# Patient Record
Sex: Male | Born: 1950
Health system: Southern US, Community
[De-identification: ages and names within clinical notes are randomized; demographics above are authoritative.]

## PROBLEM LIST (undated history)

## (undated) DIAGNOSIS — I10 Essential (primary) hypertension: Secondary | ICD-10-CM

## (undated) DIAGNOSIS — C801 Malignant (primary) neoplasm, unspecified: Secondary | ICD-10-CM

## (undated) DIAGNOSIS — H409 Unspecified glaucoma: Secondary | ICD-10-CM

## (undated) DIAGNOSIS — K219 Gastro-esophageal reflux disease without esophagitis: Secondary | ICD-10-CM

## (undated) DIAGNOSIS — N400 Enlarged prostate without lower urinary tract symptoms: Secondary | ICD-10-CM

## (undated) DIAGNOSIS — M1990 Unspecified osteoarthritis, unspecified site: Secondary | ICD-10-CM

## (undated) DIAGNOSIS — K635 Polyp of colon: Secondary | ICD-10-CM

## (undated) DIAGNOSIS — F419 Anxiety disorder, unspecified: Secondary | ICD-10-CM

## (undated) DIAGNOSIS — E785 Hyperlipidemia, unspecified: Secondary | ICD-10-CM

## (undated) HISTORY — DX: Essential (primary) hypertension: I10

## (undated) HISTORY — DX: Polyp of colon: K63.5

## (undated) HISTORY — DX: Benign prostatic hyperplasia without lower urinary tract symptoms: N40.0

## (undated) HISTORY — DX: Hyperlipidemia, unspecified: E78.5

## (undated) HISTORY — DX: Gastro-esophageal reflux disease without esophagitis: K21.9

---

## 2005-03-31 ENCOUNTER — Emergency Department: Payer: Self-pay | Admitting: Emergency Medicine

## 2007-12-16 ENCOUNTER — Ambulatory Visit: Payer: Self-pay | Admitting: Specialist

## 2007-12-16 ENCOUNTER — Emergency Department: Payer: Self-pay | Admitting: Emergency Medicine

## 2007-12-16 ENCOUNTER — Other Ambulatory Visit: Payer: Self-pay

## 2008-07-13 HISTORY — PX: COLONOSCOPY: SHX174

## 2009-03-13 LAB — HM COLONOSCOPY: HM COLON: ABNORMAL

## 2009-04-10 ENCOUNTER — Ambulatory Visit: Payer: Self-pay | Admitting: Gastroenterology

## 2009-08-08 ENCOUNTER — Ambulatory Visit: Payer: Self-pay | Admitting: Family Medicine

## 2010-07-17 ENCOUNTER — Other Ambulatory Visit: Payer: Self-pay | Admitting: Family Medicine

## 2012-07-26 DIAGNOSIS — Z8042 Family history of malignant neoplasm of prostate: Secondary | ICD-10-CM | POA: Insufficient documentation

## 2012-07-26 DIAGNOSIS — N401 Enlarged prostate with lower urinary tract symptoms: Secondary | ICD-10-CM | POA: Insufficient documentation

## 2012-07-26 DIAGNOSIS — R339 Retention of urine, unspecified: Secondary | ICD-10-CM | POA: Insufficient documentation

## 2012-07-26 DIAGNOSIS — R3129 Other microscopic hematuria: Secondary | ICD-10-CM | POA: Insufficient documentation

## 2012-09-20 DIAGNOSIS — R310 Gross hematuria: Secondary | ICD-10-CM | POA: Insufficient documentation

## 2012-11-30 ENCOUNTER — Ambulatory Visit: Payer: Self-pay | Admitting: Urology

## 2012-12-03 LAB — URINE CULTURE

## 2012-12-07 ENCOUNTER — Ambulatory Visit: Payer: Self-pay | Admitting: Urology

## 2014-05-31 ENCOUNTER — Encounter: Payer: Self-pay | Admitting: *Deleted

## 2014-06-11 ENCOUNTER — Encounter: Payer: Self-pay | Admitting: General Surgery

## 2014-06-11 ENCOUNTER — Ambulatory Visit (INDEPENDENT_AMBULATORY_CARE_PROVIDER_SITE_OTHER): Payer: BC Managed Care – PPO | Admitting: General Surgery

## 2014-06-11 VITALS — BP 102/76 | HR 80 | Resp 16 | Ht 69.0 in | Wt 168.0 lb

## 2014-06-11 DIAGNOSIS — L729 Follicular cyst of the skin and subcutaneous tissue, unspecified: Secondary | ICD-10-CM

## 2014-06-11 NOTE — Progress Notes (Signed)
Patient ID: Ernest Sweeney, male   DOB: [birth year], 63 y.o.   MRN: [id number]  Chief Complaint  Patient presents with  . Other    evaluation of cyst on back    HPI Ernest Sweeney is a 63 y.o. male who presents for an evaluation of a cyst on his back. The patient states the area has been present for approximately 7-8 years. The area has gradually gotten larger over time. He states the area will get inflamed and then go back down. He has had the area drained approximately 4-5 years ago.    HPI  Past Medical History  Diagnosis Date  . Hyperlipidemia   . Hypertension   . GERD (gastroesophageal reflux disease)   . Benign prostatic hypertrophy without lower urinary tract symptoms     History reviewed. No pertinent past surgical history.  History reviewed. No pertinent family history.  Social History History  Substance Use Topics  . Smoking status: Current Every Day Smoker -- 0.50 packs/day for 20 years  . Smokeless tobacco: Never Used  . Alcohol Use: Yes    No Known Allergies  Current Outpatient Prescriptions  Medication Sig Dispense Refill  . aspirin 81 MG tablet Take 81 mg by mouth daily.    . Cholecalciferol (VITAMIN D3) 5000 UNITS CAPS Take 1 capsule by mouth daily.    Marland Kitchen lisinopril (PRINIVIL,ZESTRIL) 10 MG tablet Take 10 mg by mouth daily.    Marland Kitchen lovastatin (MEVACOR) 20 MG tablet Take 20 mg by mouth at bedtime.    . tamsulosin (FLOMAX) 0.4 MG CAPS capsule Take 0.4 mg by mouth daily.     No current facility-administered medications for this visit.    Review of Systems Review of Systems  Constitutional: Negative.   Respiratory: Negative.   Cardiovascular: Negative.     Blood pressure 102/76, pulse 80, resp. rate 16, height 5\' 9"  (1.753 m), weight 168 lb (76.204 kg).  Physical Exam Physical Exam  Constitutional: He is oriented to person, place, and time. He appears well-developed and well-nourished.  Eyes: Conjunctivae are normal. No scleral icterus.  Neck: Neck  supple.  Cardiovascular: Normal rate, regular rhythm and normal heart sounds.   Pulmonary/Chest: Effort normal and breath sounds normal.  Musculoskeletal:       Back:       Arms: Neurological: He is alert and oriented to person, place, and time.  Skin: Skin is warm and dry.    Data Reviewed none  Assessment 2cm cutaneous cyst mid upper back, symptomatic     Plan    Patient to return to have excision of back cyst. Patient is agreeable to this       [facility] G 06/11/2014, 2:58 PM

## 2014-06-11 NOTE — Patient Instructions (Signed)
Return for planned excision of the cyst on backl

## 2014-06-19 ENCOUNTER — Encounter: Payer: Self-pay | Admitting: General Surgery

## 2014-06-19 ENCOUNTER — Other Ambulatory Visit: Payer: Self-pay | Admitting: General Surgery

## 2014-06-19 ENCOUNTER — Ambulatory Visit (INDEPENDENT_AMBULATORY_CARE_PROVIDER_SITE_OTHER): Payer: BC Managed Care – PPO | Admitting: General Surgery

## 2014-06-19 VITALS — BP 116/76 | HR 76 | Resp 12 | Ht 69.0 in | Wt 167.0 lb

## 2014-06-19 DIAGNOSIS — L729 Follicular cyst of the skin and subcutaneous tissue, unspecified: Secondary | ICD-10-CM

## 2014-06-19 HISTORY — PX: OTHER SURGICAL HISTORY: SHX169

## 2014-06-19 NOTE — Patient Instructions (Addendum)
Patient to return in 2 weeks for suture removal. The patient is aware to call back for any questions or concerns. May use Tylenol or ibuprofen for pain.

## 2014-06-19 NOTE — Progress Notes (Signed)
Patient ID: Ernest Sweeney, male   DOB: 02-Mar-1951, 62 y.o.   MRN: [id number]  The patient presents for an excision of a skin cyst on the back. No new problems at this time.   Procedure: Excision cyst on upper back.  Anesthetic: 41ml 1% xylocaine mixed with 0.5% marcaine  Prep: Chloro Prep  After local anesthesia and prep vertical elliptical incision made. Cyst was 2.5cm in diameter and was excised out in full. 3 bleeders were noted that required suture ligatures of 3-0 vicryl. Other bleeding points were cuaterized. Deeper tissue cloed with 3-0 Vicryl. Skin closed with interrupted 4-0 Nylon sutures. Dressed with neosporin, telfa, gauze and tegaderm. No immediate problems from procedure.

## 2014-06-21 ENCOUNTER — Encounter: Payer: Self-pay | Admitting: General Surgery

## 2014-06-21 LAB — PATHOLOGY

## 2014-06-25 ENCOUNTER — Telehealth: Payer: Self-pay | Admitting: *Deleted

## 2014-06-25 NOTE — Telephone Encounter (Signed)
-----   Message from Christene Lye, MD sent at 06/22/2014  8:18 AM EST ----- Please let pt pt know the pathology was normal.

## 2014-06-25 NOTE — Telephone Encounter (Signed)
Notified patient as instructed, patient pleased. Discussed follow-up appointments, patient agrees  

## 2014-06-26 ENCOUNTER — Telehealth: Payer: Self-pay | Admitting: *Deleted

## 2014-06-26 NOTE — Telephone Encounter (Signed)
-----   Message from Christene Lye, MD sent at 06/22/2014  8:18 AM EST ----- Please let pt pt know the pathology was normal.

## 2014-07-03 ENCOUNTER — Ambulatory Visit (INDEPENDENT_AMBULATORY_CARE_PROVIDER_SITE_OTHER): Payer: Self-pay | Admitting: *Deleted

## 2014-07-03 DIAGNOSIS — L729 Follicular cyst of the skin and subcutaneous tissue, unspecified: Secondary | ICD-10-CM

## 2014-07-03 NOTE — Patient Instructions (Signed)
The patient is aware to call back for any questions or concerns.  

## 2014-07-03 NOTE — Progress Notes (Signed)
Patient ID: Ernest Sweeney, male   DOB: 1951/05/18, 63 y.o.   MRN: [id number]   Patient came in today for a wound check.  The wound is clean, with no signs of infection noted. Follow up as scheduled. The sutures were removed and steri strips applied. Patient notified of pathology results.

## 2014-08-24 LAB — PSA: PSA: 2.6

## 2014-08-28 ENCOUNTER — Encounter: Payer: Self-pay | Admitting: General Surgery

## 2014-08-28 ENCOUNTER — Other Ambulatory Visit: Payer: Self-pay

## 2014-08-28 ENCOUNTER — Ambulatory Visit (INDEPENDENT_AMBULATORY_CARE_PROVIDER_SITE_OTHER): Payer: BLUE CROSS/BLUE SHIELD | Admitting: General Surgery

## 2014-08-28 VITALS — BP 98/64 | HR 90 | Resp 14 | Ht 69.0 in | Wt 174.0 lb

## 2014-08-28 DIAGNOSIS — Z86010 Personal history of colonic polyps: Secondary | ICD-10-CM

## 2014-08-28 MED ORDER — POLYETHYLENE GLYCOL 3350 17 GM/SCOOP PO POWD: 1.0000 | Freq: Once | ORAL | Status: DC

## 2014-08-28 NOTE — Patient Instructions (Signed)
Colonoscopy  A colonoscopy is an exam to look at the entire large intestine (colon). This exam can help find problems such as tumors, polyps, inflammation, and areas of bleeding. The exam takes about 1 hour.   LET YOUR HEALTH CARE PROVIDER KNOW ABOUT:   · Any allergies you have.  · All medicines you are taking, including vitamins, herbs, eye drops, creams, and over-the-counter medicines.  · Previous problems you or members of your family have had with the use of anesthetics.  · Any blood disorders you have.  · Previous surgeries you have had.  · Medical conditions you have.  RISKS AND COMPLICATIONS   Generally, this is a safe procedure. However, as with any procedure, complications can occur. Possible complications include:  · Bleeding.  · Tearing or rupture of the colon wall.  · Reaction to medicines given during the exam.  · Infection (rare).  BEFORE THE PROCEDURE   · Ask your health care provider about changing or stopping your regular medicines.  · You may be prescribed an oral bowel prep. This involves drinking a large amount of medicated liquid, starting the day before your procedure. The liquid will cause you to have multiple loose stools until your stool is almost clear or light green. This cleans out your colon in preparation for the procedure.  · Do not eat or drink anything else once you have started the bowel prep, unless your health care provider tells you it is safe to do so.  · Arrange for someone to drive you home after the procedure.  PROCEDURE   · You will be given medicine to help you relax (sedative).  · You will lie on your side with your knees bent.  · A long, flexible tube with a light and camera on the end (colonoscope) will be inserted through the rectum and into the colon. The camera sends video back to a computer screen as it moves through the colon. The colonoscope also releases carbon dioxide gas to inflate the colon. This helps your health care provider see the area better.  · During  the exam, your health care provider may take a small tissue sample (biopsy) to be examined under a microscope if any abnormalities are found.  · The exam is finished when the entire colon has been viewed.  AFTER THE PROCEDURE   · Do not drive for 24 hours after the exam.  · You may have a small amount of blood in your stool.  · You may pass moderate amounts of gas and have mild abdominal cramping or bloating. This is caused by the gas used to inflate your colon during the exam.  · Ask when your test results will be ready and how you will get your results. Make sure you get your test results.  Document Released: 06/26/2000 Document Revised: 04/19/2013 Document Reviewed: 03/06/2013  Exit[employer]® Patient Information ©2015 [employer]. This information is not intended to replace advice given to you by your health care provider. Make sure you discuss any questions you have with your health care provider.

## 2014-08-28 NOTE — Progress Notes (Signed)
Patient ID: Ernest Sweeney, male   DOB: Apr 11, 1951, 64 y.o.   MRN: [id number]  Chief Complaint  Patient presents with  . Colonoscopy    HPI Ernest Sweeney is a 64 y.o. male.  Here today to discuss having a colonoscopy. Last completed 2010 with a history of colon polyps. Denies any gastrointestinal issues. Bowels move about everyother day , no bleeding noted. He has recently been having headaches and is trying Pamelor to see if that will help. No family history of colon cancer.   HPI  Past Medical History  Diagnosis Date  . Hyperlipidemia   . Hypertension   . GERD (gastroesophageal reflux disease)   . Benign prostatic hypertrophy without lower urinary tract symptoms   . Colon polyp     Past Surgical History  Procedure Laterality Date  . Colonoscopy  2010    Dr Sula Rumple  . Excision skin cyst  06-19-14    Dr. Festus Aloe [facility]    No family history on file.  Social History History  Substance Use Topics  . Smoking status: Current Every Day Smoker -- 0.50 packs/day for 20 years  . Smokeless tobacco: Never Used  . Alcohol Use: 0.0 oz/week    0 Standard drinks or equivalent per week    No Known Allergies  Current Outpatient Prescriptions  Medication Sig Dispense Refill  . aspirin 81 MG tablet Take 81 mg by mouth daily.    . Cholecalciferol (VITAMIN D3) 5000 UNITS CAPS Take 1 capsule by mouth daily.    Marland Kitchen lisinopril (PRINIVIL,ZESTRIL) 10 MG tablet Take 10 mg by mouth daily.    Marland Kitchen lovastatin (MEVACOR) 20 MG tablet Take 20 mg by mouth at bedtime.    . tamsulosin (FLOMAX) 0.4 MG CAPS capsule Take 0.4 mg by mouth daily.     No current facility-administered medications for this visit.    Review of Systems Review of Systems  Constitutional: Negative.   Respiratory: Negative.   Cardiovascular: Negative.   Gastrointestinal: Negative for diarrhea, constipation and blood in stool.  Neurological: Positive for headaches.    Height 5\' 9"  (1.753 m).  Physical Exam Physical  Exam  Constitutional: He is oriented to person, place, and time. He appears well-developed and well-nourished.  Eyes: Conjunctivae are normal. No scleral icterus.  Neck: Neck supple.  Cardiovascular: Normal rate, regular rhythm and normal heart sounds.   Pulmonary/Chest: Effort normal and breath sounds normal.  Abdominal: Soft. Normal appearance and bowel sounds are normal. There is no tenderness. No hernia.  Lymphadenopathy:    He has no cervical adenopathy.  Neurological: He is alert and oriented to person, place, and time.  Skin: Skin is warm and dry.    Data Reviewed none  Assessment    Stable physical exam. PH of colon polyps. Last colonoscopy was in 2010    Plan    Colonoscopy with possible biopsy/polypectomy prn: Information regarding the procedure, including its potential risks and complications (including but not limited to perforation of the bowel, which may require emergency surgery to repair, and bleeding) was verbally given to the patient. Educational information regarding lower instestinal endoscopy was given to the patient. Written instructions for how to complete the bowel prep using Miralax were provided. The importance of drinking ample fluids to avoid dehydration as a result of the prep emphasized.       Ernest Sweeney M 08/28/2014, 3:51 PM

## 2014-08-28 NOTE — Progress Notes (Signed)
Patient is scheduled for a colonoscopy at [facility] on 10/10/14. He is aware to pre register with the hospital at least 2 days prior. He will only take his Lisinopril at 6 am with a small sip of water the day of. Miralax prescription has been sent into his pharmacy. Patient is aware of date and instructions.

## 2014-09-19 ENCOUNTER — Telehealth: Payer: Self-pay | Admitting: *Deleted

## 2014-09-19 NOTE — Telephone Encounter (Signed)
Patient called the office yesterday to reschedule colonoscopy with Dr. Jamal Collin that is currently scheduled for 10-10-14 at [facility].  I tried to contact patient on home and cell numbers but there was no answer and I was not able to leave a message.  Will await to hear back from patient before we cancel/reschedule colonoscopy.

## 2014-09-20 ENCOUNTER — Telehealth: Payer: Self-pay | Admitting: *Deleted

## 2014-09-20 NOTE — Telephone Encounter (Signed)
Message left on home number for patient to call the office.  

## 2014-09-20 NOTE — Telephone Encounter (Signed)
Patient called the office back to reschedule colonoscopy to a different date but patient decided to leave as scheduled for now.   We will proceed with colonoscopy on 10-10-14 at [facility].

## 2014-10-03 ENCOUNTER — Other Ambulatory Visit: Payer: Self-pay | Admitting: General Surgery

## 2014-10-03 DIAGNOSIS — Z86010 Personal history of colonic polyps: Secondary | ICD-10-CM

## 2014-10-10 ENCOUNTER — Ambulatory Visit
Admit: 2014-10-10 | Disposition: A | Discharge status: Home / Self Care | Payer: Self-pay | Attending: General Surgery | Admitting: General Surgery

## 2014-10-10 DIAGNOSIS — D124 Benign neoplasm of descending colon: Secondary | ICD-10-CM | POA: Diagnosis not present

## 2014-10-10 DIAGNOSIS — Z86010 Personal history of colonic polyps: Secondary | ICD-10-CM | POA: Diagnosis not present

## 2014-10-10 DIAGNOSIS — D127 Benign neoplasm of rectosigmoid junction: Secondary | ICD-10-CM | POA: Diagnosis not present

## 2014-10-12 ENCOUNTER — Encounter: Payer: Self-pay | Admitting: General Surgery

## 2014-10-15 ENCOUNTER — Encounter: Payer: Self-pay | Admitting: General Surgery

## 2014-10-16 ENCOUNTER — Telehealth: Payer: Self-pay | Admitting: *Deleted

## 2014-10-16 NOTE — Telephone Encounter (Signed)
-----   Message from Christene Lye, MD sent at 10/15/2014  4:58 PM EDT ----- Please let pt pt know the pathology was normal.

## 2014-10-17 ENCOUNTER — Telehealth: Payer: Self-pay | Admitting: *Deleted

## 2014-10-17 NOTE — Telephone Encounter (Signed)
Pt aware that path was normal

## 2014-11-02 NOTE — Op Note (Signed)
PATIENT NAME:  Ernest Sweeney, Ernest Sweeney MR#:  [id number] DATE OF BIRTH:  11/23/1950  DATE OF PROCEDURE:  12/07/2012  PREOPERATIVE DIAGNOSES: 1. Hematuria.  2. Abnormal CT urogram.   POSTOPERATIVE DIAGNOSES: 1. Hematuria. 2. Abnormality CT urogram.  PROCEDURES: 1. Cystoscopy.  2. Left retrograde pyelogram.  3. Left ureteroscopy.   SURGEON: John Giovanni, M.D.   ASSISTANT: None.   ANESTHESIA: General.   INDICATIONS: This 64 year old male with a history of a single episode of gross hematuria. A CT urogram did show focal narrowing of the left distal ureter with mild dilation of the ureter proximal to this narrowing. He presents for cystoscopy under anesthesia with left retrograde pyelogram and possible left ureteroscopy. His hematuria has resolved.   DESCRIPTION OF PROCEDURE: The patient was taken to the cystoscopy suite and placed supine on the table. General anesthetic was administered via an LMA. He was then placed in the low lithotomy position and his external genitalia were prepped and draped in the usual fashion. Timeout was performed per protocol with all in agreement. A 21 French cystoscope with 30-degree lens was lubricated and passed under direct vision. The urethra was normal in caliber without stricture. The prostate was hypervascular and friable with mild lateral lobe enlargement. There was a moderate-size median lobe present. Bladder mucosa was closely inspected with 30 and 70-degree lenses. Ureteral orifices were normal-appearing with clear efflux bilaterally. There were wide mouth diverticula in the right and left posterior walls which were scoped and were freed of the lesions. Bladder mucosa was normal in appearance without erythema, solid or papillary lesions. An 8 [place] cone-tipped catheter was placed through the cystoscope and seated at the left ureteral orifice. Retrograde pyelogram was performed which showed no filling defect. There was narrowing of the distal ureter with mild  proximal dilation; however, the remainder of the ureter was normal in appearance and the left collecting system was normal in appearance. The cone-tipped catheter was removed. There was some hang of contrast in the distal ureter that did not empty completely. At this point, it was elected to proceed with ureteroscopy and a 0.035 guidewire was placed through the cystoscope and passed up the left.   PAST SURGICAL HISTORY: Up the left ureter and fluoroscopic guidance without problems. The cystoscope was removed and a 6-French semirigid ureteroscope was passed per urethra. A second guidewire was placed through the ureteroscope and into the left ureteral orifice and the ureteroscope was advanced over the guidewire. There was slight narrowing of the distal ureter, however, no definite stricture was identified. The scope was easily negotiated through this narrowed area. The scope was advanced up to the proximal ureter. The scope was then slowly withdrawn. No ureteral abnormalities were identified.  In the region of the distal ureter, there was no stone, evidence of stricture or tumor. The 6-French scope easily passed through this narrowed area. The ureteroscope was removed. It was elected not to place a ureteral stent. Cystoscope sheath was repassed and the bladder was emptied and the effluent was clear. A B and O suppository was placed per rectum. He was taken to PACU in stable condition. There were no complications.   ESTIMATED BLOOD LOSS:  Minimal.     ____________________________ Ronda Fairly. Bernardo Heater, MD scs:rw D: 12/07/2012 12:00:14 ET T: 12/07/2012 12:37:30 ET JOB#: [id number]  cc: Nicki Reaper C. Bernardo Heater, MD, <Dictator> Abbie Sons MD ELECTRONICALLY SIGNED 12/12/2012 12:23

## 2014-11-05 LAB — SURGICAL PATHOLOGY

## 2014-11-14 LAB — LIPID PANEL
CHOLESTEROL: 171 mg/dL (ref 0–200)
HDL: 65 mg/dL (ref 35–70)
LDL CALC: 88 mg/dL
Triglycerides: 87 mg/dL (ref 40–160)

## 2015-02-03 ENCOUNTER — Other Ambulatory Visit: Payer: Self-pay | Admitting: Family Medicine

## 2015-05-31 ENCOUNTER — Encounter: Payer: Self-pay | Admitting: Family Medicine

## 2015-05-31 ENCOUNTER — Ambulatory Visit: Payer: Self-pay | Admitting: Family Medicine

## 2015-05-31 DIAGNOSIS — E559 Vitamin D deficiency, unspecified: Secondary | ICD-10-CM | POA: Insufficient documentation

## 2015-05-31 DIAGNOSIS — G8929 Other chronic pain: Secondary | ICD-10-CM | POA: Insufficient documentation

## 2015-05-31 DIAGNOSIS — K219 Gastro-esophageal reflux disease without esophagitis: Secondary | ICD-10-CM | POA: Insufficient documentation

## 2015-05-31 DIAGNOSIS — M25512 Pain in left shoulder: Secondary | ICD-10-CM

## 2015-05-31 DIAGNOSIS — R519 Headache, unspecified: Secondary | ICD-10-CM | POA: Insufficient documentation

## 2015-05-31 DIAGNOSIS — E785 Hyperlipidemia, unspecified: Secondary | ICD-10-CM | POA: Insufficient documentation

## 2015-05-31 DIAGNOSIS — J302 Other seasonal allergic rhinitis: Secondary | ICD-10-CM | POA: Insufficient documentation

## 2015-05-31 DIAGNOSIS — M25511 Pain in right shoulder: Secondary | ICD-10-CM

## 2015-05-31 DIAGNOSIS — R51 Headache: Secondary | ICD-10-CM

## 2015-06-27 ENCOUNTER — Other Ambulatory Visit: Payer: Self-pay | Admitting: Family Medicine

## 2015-06-28 ENCOUNTER — Other Ambulatory Visit: Payer: Self-pay | Admitting: Family Medicine

## 2015-06-28 ENCOUNTER — Telehealth: Payer: Self-pay | Admitting: Family Medicine

## 2015-06-28 MED ORDER — TAMSULOSIN HCL 0.4 MG PO CAPS: 0.4000 mg | ORAL_CAPSULE | Freq: Every day | ORAL | Status: DC

## 2015-06-28 NOTE — Telephone Encounter (Signed)
Pt needs refill on Flomax to be sent to [place]. Pt is waiting on his to come into mail order and that will take 7 more days and pt is out.

## 2015-08-03 ENCOUNTER — Other Ambulatory Visit: Payer: Self-pay | Admitting: Family Medicine

## 2015-08-04 NOTE — Telephone Encounter (Signed)
He needs a follow up

## 2015-08-05 NOTE — Telephone Encounter (Signed)
Left voice message to inform patient to schedule appointment

## 2015-09-12 ENCOUNTER — Ambulatory Visit: Payer: Self-pay | Admitting: Family Medicine

## 2015-09-25 ENCOUNTER — Telehealth: Payer: Self-pay | Admitting: Family Medicine

## 2015-09-25 NOTE — Telephone Encounter (Signed)
Pt needs refill on Lisinopril to be sent to [address]. Pt has an appt on 10/03/2015.

## 2015-09-26 MED ORDER — LISINOPRIL 10 MG PO TABS
10.0000 mg | ORAL_TABLET | Freq: Every day | ORAL | Status: DC
Start: 2015-09-26 — End: 2015-10-03

## 2015-09-26 NOTE — Telephone Encounter (Signed)
Sent 30 days, needs to be seen

## 2015-10-03 ENCOUNTER — Ambulatory Visit (INDEPENDENT_AMBULATORY_CARE_PROVIDER_SITE_OTHER): Payer: BLUE CROSS/BLUE SHIELD | Admitting: Family Medicine

## 2015-10-03 ENCOUNTER — Encounter: Payer: Self-pay | Admitting: Family Medicine

## 2015-10-03 VITALS — BP 116/68 | HR 111 | Temp 98.0°F | Resp 16 | Ht 69.0 in | Wt 172.2 lb

## 2015-10-03 DIAGNOSIS — N401 Enlarged prostate with lower urinary tract symptoms: Secondary | ICD-10-CM | POA: Diagnosis not present

## 2015-10-03 DIAGNOSIS — J302 Other seasonal allergic rhinitis: Secondary | ICD-10-CM

## 2015-10-03 DIAGNOSIS — Z125 Encounter for screening for malignant neoplasm of prostate: Secondary | ICD-10-CM

## 2015-10-03 DIAGNOSIS — R000 Tachycardia, unspecified: Secondary | ICD-10-CM

## 2015-10-03 DIAGNOSIS — Z79899 Other long term (current) drug therapy: Secondary | ICD-10-CM

## 2015-10-03 DIAGNOSIS — E785 Hyperlipidemia, unspecified: Secondary | ICD-10-CM

## 2015-10-03 DIAGNOSIS — I10 Essential (primary) hypertension: Secondary | ICD-10-CM | POA: Diagnosis not present

## 2015-10-03 DIAGNOSIS — K219 Gastro-esophageal reflux disease without esophagitis: Secondary | ICD-10-CM

## 2015-10-03 DIAGNOSIS — R51 Headache: Secondary | ICD-10-CM | POA: Diagnosis not present

## 2015-10-03 DIAGNOSIS — R519 Headache, unspecified: Secondary | ICD-10-CM

## 2015-10-03 DIAGNOSIS — E559 Vitamin D deficiency, unspecified: Secondary | ICD-10-CM

## 2015-10-03 MED ORDER — LOVASTATIN 20 MG PO TABS: 20.0000 mg | ORAL_TABLET | Freq: Every day | ORAL | Status: DC

## 2015-10-03 MED ORDER — VITAMIN D 50 MCG (2000 UT) PO CAPS
1.0000 | ORAL_CAPSULE | Freq: Every day | ORAL | Status: AC
Start: 2015-10-03 — End: ?

## 2015-10-03 MED ORDER — FLUTICASONE PROPIONATE 50 MCG/ACT NA SUSP: 2.0000 | Freq: Every day | NASAL | Status: DC

## 2015-10-03 NOTE — Progress Notes (Signed)
Name: Ernest Sweeney   MRN: [id number]    DOB: [birth year]   Date:10/03/2015       Progress Note  Subjective  Chief Complaint  Chief Complaint  Patient presents with  . Medication Refill  . Hypertension  . Gastroesophageal Reflux    well controlled    HPI  HTN: he states he gets dizzy at times, not checking bp at home, but last visit bp was also low, we will stop medication and monitor. Denies chest pain or palpitation - but VS showed tachycardia  GERD: no longer has heartburn or regurgitation, not taking medication  Hyperlipidemia: taking Lovastatin, no side effect:   BPH: used to see Urologist, hematuria resolved, on Flomax, denies dribbling , nocturia at most once per night.   Headaches: no longer taking Elavil, headaches resolved  Seasonal Allergic Rhinitis: currently not having symptoms, no sneezing or nasal congestion, but has symptoms during the Spring and would like something to control it when it occurs.    Patient Active Problem List   Diagnosis Date Noted  . Vitamin D deficiency 05/31/2015  . Hyperlipidemia LDL goal <100 05/31/2015  . Seasonal allergic rhinitis 05/31/2015  . GERD without esophagitis 05/31/2015  . Chronic pain of both shoulders 05/31/2015  . Headache disorder 05/31/2015  . Family history of malignant neoplasm of prostate 07/26/2012  . Hyperplasia of prostate with lower urinary tract symptoms (LUTS) 07/26/2012  . Incomplete bladder emptying 07/26/2012  . Hematuria, microscopic 07/26/2012    Past Surgical History  Procedure Laterality Date  . Colonoscopy  2010    Dr Sula Rumple  . Excision skin cyst  06-19-14    Dr. Festus Aloe [facility]    Family History  Problem Relation Age of Onset  . Heart disease Mother   . Diabetes Father   . Cancer Sister     Breast  . Cancer Brother     Prostate    Social History   Social History  . Marital Status: Married    Spouse Name: N/A  . Number of Children: N/A  . Years of Education: N/A    Occupational History  . Not on file.   Social History Main Topics  . Smoking status: Current Every Day Smoker -- 0.50 packs/day for 20 years  . Smokeless tobacco: Never Used  . Alcohol Use: 0.0 oz/week    0 Standard drinks or equivalent per week  . Drug Use: No  . Sexual Activity: Not on file   Other Topics Concern  . Not on file   Social History Narrative     Current outpatient prescriptions:  .  aspirin 81 MG tablet, Take 81 mg by mouth daily., Disp: , Rfl:  .  Cholecalciferol (VITAMIN D) 2000 units CAPS, Take 1 capsule (2,000 Units total) by mouth daily., Disp: 30 capsule, Rfl: 0 .  fluticasone (FLONASE) 50 MCG/ACT nasal spray, Place 2 sprays into both nostrils daily., Disp: 16 g, Rfl: 6 .  lovastatin (MEVACOR) 20 MG tablet, Take 1 tablet (20 mg total) by mouth at bedtime., Disp: 90 tablet, Rfl: 3 .  tamsulosin (FLOMAX) 0.4 MG CAPS capsule, Take 1 capsule (0.4 mg total) by mouth daily., Disp: 90 capsule, Rfl: 2  No Known Allergies   ROS  Ten systems reviewed and is negative except as mentioned in HPI   Objective  Filed Vitals:   10/03/15 1503  BP: 116/68  Pulse: 111  Temp: 98 F (36.7 C)  TempSrc: Oral  Resp: 16  Height: 5\' 9"  (1.753 m)  Weight: 172 lb 3.2 oz (78.109 kg)  SpO2: 98%    Body mass index is 25.42 kg/(m^2).  Physical Exam  Constitutional: Patient appears well-developed and well-nourished. No distress.  HEENT: head atraumatic, normocephalic, pupils equal and reactive to light,  neck supple, throat within normal limits Cardiovascular: Normal rate, regular rhythm and normal heart sounds.  No murmur heard. No BLE edema. Pulmonary/Chest: Effort normal and breath sounds normal. No respiratory distress. Abdominal: Soft.  There is no tenderness. Psychiatric: Patient has a normal mood and affect. behavior is normal. Judgment and thought content normal.   PHQ2/9: Depression screen PHQ 2/9 10/03/2015  Decreased Interest 1  Down, Depressed, Hopeless  1  PHQ - 2 Score 2  Altered sleeping 1  Tired, decreased energy 1  Change in appetite 1  Feeling bad or failure about yourself  1  Trouble concentrating 1  Moving slowly or fidgety/restless 0  Suicidal thoughts 0  PHQ-9 Score 7  Difficult doing work/chores Not difficult at all   Upset because cousin died this week - got hit by a car, also worries about finances  Fall Risk: Fall Risk  10/03/2015  Falls in the past year? No    Functional Status Survey: Is the patient deaf or have difficulty hearing?: No Does the patient have difficulty seeing, even when wearing glasses/contacts?: No Does the patient have difficulty concentrating, remembering, or making decisions?: No Does the patient have difficulty walking or climbing stairs?: No Does the patient have difficulty dressing or bathing?: No Does the patient have difficulty doing errands alone such as visiting a doctor's office or shopping?: No    Assessment & Plan  1. Hyperlipidemia LDL goal <100  - lovastatin (MEVACOR) 20 MG tablet; Take 1 tablet (20 mg total) by mouth at bedtime.  Dispense: 90 tablet; Refill: 3 - Lipid panel  2. Headache disorder  Resolved  3. Hyperplasia of prostate with lower urinary tract symptoms (LUTS)  - PSA  4. Vitamin D deficiency  - Cholecalciferol (VITAMIN D) 2000 units CAPS; Take 1 capsule (2,000 Units total) by mouth daily.  Dispense: 30 capsule; Refill: 0 - VITAMIN D 25 Hydroxy (Vit-D Deficiency, Fractures)  5. Essential hypertension  - Comprehensive metabolic panel - CBC with Differential/Platelet  6. Long-term use of high-risk medication  - Comprehensive metabolic panel - CBC with Differential/Platelet  7. Prostate cancer screening  - PSA  8. Tachycardia  Hopefully will resolve once bp is controlled  9. Seasonal allergic rhinitis  - fluticasone (FLONASE) 50 MCG/ACT nasal spray; Place 2 sprays into both nostrils daily.  Dispense: 16 g; Refill: 6

## 2015-10-19 LAB — COMPREHENSIVE METABOLIC PANEL
ALK PHOS: 52 IU/L (ref 39–117)
ALT: 11 IU/L (ref 0–44)
AST: 16 IU/L (ref 0–40)
Albumin/Globulin Ratio: 1.7 (ref 1.2–2.2)
Albumin: 4.3 g/dL (ref 3.6–4.8)
BUN/Creatinine Ratio: 12 (ref 10–24)
BUN: 15 mg/dL (ref 8–27)
Bilirubin Total: 0.8 mg/dL (ref 0.0–1.2)
CO2: 25 mmol/L (ref 18–29)
CREATININE: 1.24 mg/dL (ref 0.76–1.27)
Calcium: 9.6 mg/dL (ref 8.6–10.2)
Chloride: 102 mmol/L (ref 96–106)
GFR calc Af Amer: 71 mL/min/{1.73_m2} (ref >59)
GFR calc non Af Amer: 61 mL/min/{1.73_m2} (ref >59)
GLOBULIN, TOTAL: 2.6 g/dL (ref 1.5–4.5)
GLUCOSE: 88 mg/dL (ref 65–99)
Potassium: 4.8 mmol/L (ref 3.5–5.2)
SODIUM: 140 mmol/L (ref 134–144)
Total Protein: 6.9 g/dL (ref 6.0–8.5)

## 2015-10-19 LAB — CBC WITH DIFFERENTIAL/PLATELET
BASOS ABS: 0 10*3/uL (ref 0.0–0.2)
Basos: 1 %
EOS (ABSOLUTE): 0.1 10*3/uL (ref 0.0–0.4)
Eos: 3 %
Hematocrit: 43.1 % (ref 37.5–51.0)
Hemoglobin: 14.4 g/dL (ref 12.6–17.7)
IMMATURE GRANS (ABS): 0 10*3/uL (ref 0.0–0.1)
Immature Granulocytes: 0 %
LYMPHS: 35 %
Lymphocytes Absolute: 1.3 10*3/uL (ref 0.7–3.1)
MCH: 32 pg (ref 26.6–33.0)
MCHC: 33.4 g/dL (ref 31.5–35.7)
MCV: 96 fL (ref 79–97)
Monocytes Absolute: 0.5 10*3/uL (ref 0.1–0.9)
Monocytes: 14 %
NEUTROS ABS: 1.8 10*3/uL (ref 1.4–7.0)
Neutrophils: 47 %
PLATELETS: 323 10*3/uL (ref 150–379)
RBC: 4.5 x10E6/uL (ref 4.14–5.80)
RDW: 13.7 % (ref 12.3–15.4)
WBC: 3.6 10*3/uL (ref 3.4–10.8)

## 2015-10-19 LAB — LIPID PANEL
CHOL/HDL RATIO: 2.3 ratio (ref 0.0–5.0)
Cholesterol, Total: 176 mg/dL (ref 100–199)
HDL: 78 mg/dL (ref >39)
LDL CALC: 88 mg/dL (ref 0–99)
Triglycerides: 51 mg/dL (ref 0–149)
VLDL CHOLESTEROL CAL: 10 mg/dL (ref 5–40)

## 2015-10-19 LAB — PSA: PROSTATE SPECIFIC AG, SERUM: 4 ng/mL (ref 0.0–4.0)

## 2015-10-19 LAB — VITAMIN D 25 HYDROXY (VIT D DEFICIENCY, FRACTURES): VIT D 25 HYDROXY: 26.1 ng/mL — AB (ref 30.0–100.0)

## 2015-11-12 ENCOUNTER — Other Ambulatory Visit: Payer: Self-pay

## 2015-11-12 DIAGNOSIS — E785 Hyperlipidemia, unspecified: Secondary | ICD-10-CM

## 2015-11-12 MED ORDER — LOVASTATIN 20 MG PO TABS: 20.0000 mg | ORAL_TABLET | Freq: Every day | ORAL | Status: DC

## 2015-11-12 NOTE — Telephone Encounter (Signed)
Patient called stating that he did not want his medication to go to [facility] but to [facility].  Refill request was sent to Dr. [name] for approval and submission.

## 2016-02-05 ENCOUNTER — Ambulatory Visit (INDEPENDENT_AMBULATORY_CARE_PROVIDER_SITE_OTHER): Payer: BLUE CROSS/BLUE SHIELD | Admitting: Family Medicine

## 2016-02-05 ENCOUNTER — Encounter: Payer: Self-pay | Admitting: Family Medicine

## 2016-02-05 VITALS — BP 128/88 | HR 98 | Temp 98.1°F | Resp 18 | Ht 69.0 in | Wt 171.0 lb

## 2016-02-05 DIAGNOSIS — R972 Elevated prostate specific antigen [PSA]: Secondary | ICD-10-CM

## 2016-02-05 DIAGNOSIS — E785 Hyperlipidemia, unspecified: Secondary | ICD-10-CM | POA: Diagnosis not present

## 2016-02-05 DIAGNOSIS — Z0000 Encounter for general adult medical examination without abnormal findings: Secondary | ICD-10-CM | POA: Diagnosis not present

## 2016-02-05 DIAGNOSIS — Z23 Encounter for immunization: Secondary | ICD-10-CM | POA: Diagnosis not present

## 2016-02-05 MED ORDER — LOVASTATIN 20 MG PO TABS
20.0000 mg | ORAL_TABLET | Freq: Every day | ORAL | 1 refills | Status: DC
Start: 2016-02-05 — End: 2016-08-04

## 2016-02-05 NOTE — Progress Notes (Signed)
Name: Ernest Sweeney   MRN: [id number]    DOB: 05/31/1951   Date:02/05/2016       Progress Note  Subjective  Chief Complaint  Chief Complaint  Patient presents with  . Annual Exam    HPI  Male Exam: no dysuria, he does not have ED but occasionally has difficulty with ejaculation - when he takes flomax. No longer getting dizzy- since he stopped taking bp medication. PSA was elevated and we will recheck it today, if still high we will refer him to Urologist ( Dr. Bernardo Heater )   Patient Active Problem List   Diagnosis Date Noted  . Vitamin D deficiency 05/31/2015  . Hyperlipidemia LDL goal <100 05/31/2015  . Seasonal allergic rhinitis 05/31/2015  . Chronic pain of both shoulders 05/31/2015  . Family history of malignant neoplasm of prostate 07/26/2012  . Hyperplasia of prostate with lower urinary tract symptoms (LUTS) 07/26/2012  . Incomplete bladder emptying 07/26/2012  . Hematuria, microscopic 07/26/2012    Past Surgical History:  Procedure Laterality Date  . COLONOSCOPY  2010   Dr Sula Rumple  . excision skin cyst  06-19-14   Dr. Festus Aloe CYST    Family History  Problem Relation Age of Onset  . Heart disease Mother   . Diabetes Father   . Cancer Sister     Breast  . Cancer Brother     Prostate    Social History   Social History  . Marital status: Married    Spouse name: N/A  . Number of children: N/A  . Years of education: N/A   Occupational History  . Not on file.   Social History Main Topics  . Smoking status: Current Every Day Smoker    Packs/day: 0.50    Years: 20.00  . Smokeless tobacco: Never Used  . Alcohol use 0.0 oz/week  . Drug use: No  . Sexual activity: Not on file   Other Topics Concern  . Not on file   Social History Narrative  . No narrative on file     Current Outpatient Prescriptions:  .  aspirin 81 MG tablet, Take 81 mg by mouth daily., Disp: , Rfl:  .  Cholecalciferol (VITAMIN D) 2000 units CAPS, Take 1 capsule (2,000  Units total) by mouth daily., Disp: 30 capsule, Rfl: 0 .  fluticasone (FLONASE) 50 MCG/ACT nasal spray, Place 2 sprays into both nostrils daily., Disp: 16 g, Rfl: 6 .  lovastatin (MEVACOR) 20 MG tablet, Take 1 tablet (20 mg total) by mouth at bedtime., Disp: 90 tablet, Rfl: 1 .  tamsulosin (FLOMAX) 0.4 MG CAPS capsule, Take 1 capsule (0.4 mg total) by mouth daily., Disp: 90 capsule, Rfl: 2  No Known Allergies   ROS  Constitutional: Negative for fever or weight change.  Respiratory: Negative for cough and shortness of breath.   Cardiovascular: Negative for chest pain or palpitations.  Gastrointestinal: Negative for abdominal pain, no bowel changes.  Musculoskeletal: Negative for gait problem or joint swelling.  Skin: Negative for rash.  Neurological: Negative for dizziness or headache.  No other specific complaints in a complete review of systems (except as listed in HPI above).  Objective  Vitals:   02/05/16 1357  BP: 128/88  Pulse: 98  Resp: 18  Temp: 98.1 F (36.7 C)  SpO2: 97%  Weight: 171 lb (77.6 kg)  Height: 5\' 9"  (1.753 m)    Body mass index is 25.25 kg/m.  Physical Exam  Constitutional: Patient appears well-developed and well-nourished. No distress.  HENT: Head: Normocephalic and atraumatic. Ears: B TMs ok, no erythema or effusion; Nose: Nose normal. Mouth/Throat: Oropharynx is clear and moist. No oropharyngeal exudate.  Eyes: Conjunctivae and EOM are normal. Pupils are equal, round, and reactive to light. No scleral icterus.  Neck: Normal range of motion. Neck supple. No JVD present. No thyromegaly present.  Cardiovascular: Normal rate, regular rhythm and normal heart sounds.  No murmur heard. No BLE edema. Pulmonary/Chest: Effort normal and breath sounds normal. No respiratory distress. Abdominal: Soft. Bowel sounds are normal, no distension. There is no tenderness. no masses MALE GENITALIA: Normal descended testes bilaterally, no masses palpated, no hernias, no  lesions, no discharge RECTAL: Prostate normal size and consistency, no rectal masses or hemorrhoids  Musculoskeletal: Normal range of motion, no joint effusions. No gross deformities Neurological: he is alert and oriented to person, place, and time. No cranial nerve deficit. Coordination, balance, strength, speech and gait are normal.  Skin: Skin is warm and dry. No erythema.  Psychiatric: Patient has a normal mood and affect. behavior is normal. Judgment and thought content normal.  PHQ2/9: Depression screen [facility] 2/9 02/05/2016 10/03/2015  Decreased Interest 0 1  Down, Depressed, Hopeless 0 1  PHQ - 2 Score 0 2  Altered sleeping - 1  Tired, decreased energy - 1  Change in appetite - 1  Feeling bad or failure about yourself  - 1  Trouble concentrating - 1  Moving slowly or fidgety/restless - 0  Suicidal thoughts - 0  PHQ-9 Score - 7  Difficult doing work/chores - Not difficult at all     Fall Risk: Fall Risk  02/05/2016 10/03/2015  Falls in the past year? No No     Functional Status Survey: Is the patient deaf or have difficulty hearing?: No Does the patient have difficulty seeing, even when wearing glasses/contacts?: No Does the patient have difficulty concentrating, remembering, or making decisions?: No Does the patient have difficulty walking or climbing stairs?: No Does the patient have difficulty dressing or bathing?: No Does the patient have difficulty doing errands alone such as visiting a doctor's office or shopping?: No    Assessment & Plan  1. Encounter for routine history and physical exam for male  Discussed importance of 150 minutes of physical activity weekly, eat two servings of fish weekly, eat one serving of tree nuts ( cashews, pistachios, pecans, almonds.Marland Kitchen) every other day, eat 6 servings of fruit/vegetables daily and drink plenty of water and avoid sweet beverages.   2. PSA elevation  - PSA, had gone up and is due for repeat, if elevated again we will  refer him back to Urologist. Seen by Dr. Bernardo Heater in the past  3. Hyperlipidemia LDL goal <100  - lovastatin (MEVACOR) 20 MG tablet; Take 1 tablet (20 mg total) by mouth at bedtime.  Dispense: 90 tablet; Refill: 1

## 2016-02-06 ENCOUNTER — Other Ambulatory Visit: Payer: Self-pay | Admitting: Family Medicine

## 2016-02-06 DIAGNOSIS — R972 Elevated prostate specific antigen [PSA]: Secondary | ICD-10-CM

## 2016-02-06 LAB — PSA: PSA: 5.99 ng/mL — AB (ref <=4.00)

## 2016-02-07 ENCOUNTER — Telehealth: Payer: Self-pay

## 2016-02-07 NOTE — Telephone Encounter (Signed)
Was unable to leave a message due to mailbox being full, was calling for patient to be notified of his lab results.

## 2016-02-07 NOTE — Telephone Encounter (Signed)
-----   Message from [name], MD sent at 02/06/2016  1:32 PM EDT ----- PSA is even higher, needs to go back to Urologist. I will send him back to [facility] - Dr. Bernardo Heater, they are now in [facility]

## 2016-03-17 ENCOUNTER — Ambulatory Visit (INDEPENDENT_AMBULATORY_CARE_PROVIDER_SITE_OTHER): Payer: PPO | Admitting: Urology

## 2016-03-17 ENCOUNTER — Encounter: Payer: Self-pay | Admitting: Urology

## 2016-03-17 VITALS — BP 140/92 | HR 85 | Ht 69.0 in | Wt 174.7 lb

## 2016-03-17 DIAGNOSIS — R972 Elevated prostate specific antigen [PSA]: Secondary | ICD-10-CM

## 2016-03-17 DIAGNOSIS — Z8042 Family history of malignant neoplasm of prostate: Secondary | ICD-10-CM

## 2016-03-17 LAB — URINALYSIS, COMPLETE
Bilirubin, UA: NEGATIVE
GLUCOSE, UA: NEGATIVE
KETONES UA: NEGATIVE
LEUKOCYTES UA: NEGATIVE
Nitrite, UA: NEGATIVE
SPEC GRAV UA: 1.02 (ref 1.005–1.030)
Urobilinogen, Ur: 0.2 mg/dL (ref 0.2–1.0)
pH, UA: 6 (ref 5.0–7.5)

## 2016-03-17 LAB — MICROSCOPIC EXAMINATION
Bacteria, UA: NONE SEEN
WBC, UA: NONE SEEN /hpf (ref 0–?)

## 2016-03-17 MED ORDER — CIPROFLOXACIN HCL 500 MG PO TABS: 500.0000 mg | ORAL_TABLET | Freq: Two times a day (BID) | ORAL | 0 refills | Status: AC

## 2016-03-17 NOTE — Progress Notes (Signed)
03/17/2016 8:59 AM   Ernest Sweeney 1950-08-30 [id number]  Referring provider: [name], MD [address] [place], [place] [address]  Chief Complaint  Patient presents with  . New Patient (Initial Visit)    elevated PSA    HPI: Mr Ernest Sweeney is a 65yo with a hx of HTN, GERD, and BPH here for elevated PSA. PSA on referral is 5.99 which has increased from 4.0 6 months ago and 2.6 1 year ago. He has baseline frequency, urgency, nocturia, hesitancy, weak stream. IPSS 15 with bother of 3. He has an occasional feeling of incomplete emptying. He is currently on flomax 0.4mg  daily. He had a UTI 5-6 years ago, no hx of prostate infections.  His brother has prostate cancer and underwent RRP 8-9 years ago and has had not recurrance.    PMH: Past Medical History:  Diagnosis Date  . Benign prostatic hypertrophy without lower urinary tract symptoms   . Colon polyp   . GERD (gastroesophageal reflux disease)   . Hyperlipidemia   . Hypertension     Surgical History: Past Surgical History:  Procedure Laterality Date  . COLONOSCOPY  2010   Dr Sula Rumple  . excision skin cyst  06-19-14   Dr. Festus Aloe CYST    Home Medications:    Medication List       Accurate as of 03/17/16  8:59 AM. Always use your most recent med list.          aspirin 81 MG tablet Take 81 mg by mouth daily.   fluticasone 50 MCG/ACT nasal spray Commonly known as:  FLONASE Place 2 sprays into both nostrils daily.   lovastatin 20 MG tablet Commonly known as:  MEVACOR Take 1 tablet (20 mg total) by mouth at bedtime.   tamsulosin 0.4 MG Caps capsule Commonly known as:  FLOMAX Take 1 capsule (0.4 mg total) by mouth daily.   Vitamin D 2000 units Caps Take 1 capsule (2,000 Units total) by mouth daily.       Allergies: No Known Allergies  Family History: Family History  Problem Relation Age of Onset  . Heart disease Mother   . Diabetes Father   . Cancer Sister     Breast  .  Cancer Brother     Prostate    Social History:  reports that he has been smoking.  He has a 10.00 pack-year smoking history. He has never used smokeless tobacco. He reports that he drinks alcohol. He reports that he does not use drugs.  ROS: UROLOGY Frequent Urination?: Yes Hard to postpone urination?: Yes Burning/pain with urination?: No Get up at night to urinate?: Yes Leakage of urine?: No Urine stream starts and stops?: Yes Trouble starting stream?: Yes Do you have to strain to urinate?: Yes Blood in urine?: No Urinary tract infection?: No Sexually transmitted disease?: No Injury to kidneys or bladder?: No Painful intercourse?: No Weak stream?: No Erection problems?: No Penile pain?: No  Gastrointestinal Nausea?: No Vomiting?: No Indigestion/heartburn?: No Diarrhea?: No Constipation?: No  Constitutional Fever: No Night sweats?: No Weight loss?: No Fatigue?: No  Skin Skin rash/lesions?: No Itching?: No  Eyes Blurred vision?: No Double vision?: No  Ears/Nose/Throat Sore throat?: No Sinus problems?: No  Hematologic/Lymphatic Swollen glands?: No Easy bruising?: No  Cardiovascular Leg swelling?: No Chest pain?: No  Respiratory Cough?: No Shortness of breath?: No  Endocrine Excessive thirst?: No  Musculoskeletal Back pain?: No Joint pain?: No  Neurological Headaches?: No Dizziness?: No  Psychologic Depression?: No  Anxiety?: No  Physical Exam: BP (!) 140/92 (BP Location: Left Arm, Patient Position: Sitting, Cuff Size: Large)   Pulse 85   Ht 5\' 9"  (1.753 m)   Wt 79.2 kg (174 lb 11.2 oz)   BMI 25.80 kg/m   Constitutional:  Alert and oriented, No acute distress. HEENT: [place] AT, moist mucus membranes.  Trachea midline, no masses. Cardiovascular: No clubbing, cyanosis, or edema. Respiratory: Normal respiratory effort, no increased work of breathing. GI: Abdomen is soft, nontender, nondistended, no abdominal masses GU: No CVA tenderness.  uncircumcised phallus. Testis atrophic. No masses/lesions on penis/testis/scrotum. Prostate 50g smooth no nodules. Normal rectal tone  Skin: No rashes, bruises or suspicious lesions. Lymph: No cervical or inguinal adenopathy. Neurologic: Grossly intact, no focal deficits, moving all 4 extremities. Psychiatric: Normal mood and affect.  Laboratory Data: Lab Results  Component Value Date   WBC 3.6 10/18/2015   HCT 43.1 10/18/2015   MCV 96 10/18/2015   PLT 323 10/18/2015    Lab Results  Component Value Date   CREATININE 1.24 10/18/2015    Lab Results  Component Value Date   PSA 5.99 (H) 02/05/2016   PSA 2.6 08/24/2014    No results found for: TESTOSTERONE  No results found for: HGBA1C  Urinalysis No results found for: COLORURINE, APPEARANCEUR, LABSPEC, PHURINE, GLUCOSEU, HGBUR, BILIRUBINUR, KETONESUR, PROTEINUR, UROBILINOGEN, NITRITE, LEUKOCYTESUR  Pertinent Imaging: none  Assessment & Plan:    1. Elevated PSA -schedule for prostate biopsy -RX for cipro sent - Urinalysis, Complete  2. Family history of malignant neoplasm of prostate  - Urinalysis, Complete   No Follow-up on file.  Nicolette Bang, MD  [facility] [address] [place], [place] [address] [phone]

## 2016-04-24 ENCOUNTER — Other Ambulatory Visit: Payer: PPO

## 2016-05-05 ENCOUNTER — Ambulatory Visit: Payer: PPO

## 2016-05-06 ENCOUNTER — Other Ambulatory Visit: Payer: Self-pay | Admitting: Urology

## 2016-05-06 ENCOUNTER — Ambulatory Visit: Payer: PPO | Admitting: Urology

## 2016-05-06 VITALS — BP 136/94 | HR 112 | Ht 69.0 in | Wt 175.0 lb

## 2016-05-06 DIAGNOSIS — N4289 Other specified disorders of prostate: Secondary | ICD-10-CM | POA: Diagnosis not present

## 2016-05-06 DIAGNOSIS — N4232 Atypical small acinar proliferation of prostate: Secondary | ICD-10-CM | POA: Diagnosis not present

## 2016-05-06 DIAGNOSIS — C61 Malignant neoplasm of prostate: Secondary | ICD-10-CM | POA: Diagnosis not present

## 2016-05-06 DIAGNOSIS — R972 Elevated prostate specific antigen [PSA]: Secondary | ICD-10-CM

## 2016-05-06 MED ORDER — LEVOFLOXACIN 500 MG PO TABS
500.0000 mg | ORAL_TABLET | Freq: Once | ORAL | Status: AC
  Administered 2016-05-06: 500 mg via ORAL

## 2016-05-06 MED ORDER — LIDOCAINE HCL 2 % EX GEL
1.0000 "application " | Freq: Once | CUTANEOUS | Status: AC
  Administered 2016-05-06: 1 via URETHRAL

## 2016-05-06 MED ORDER — GENTAMICIN SULFATE 40 MG/ML IJ SOLN
80.0000 mg | Freq: Once | INTRAMUSCULAR | Status: AC
  Administered 2016-05-06: 80 mg via INTRAMUSCULAR

## 2016-05-06 NOTE — Progress Notes (Signed)
Ernest Sweeney is a 65yo with a hx of HTN, GERD, and BPH here for biopsy because of elevated PSA. PSA on referral is 5.99 which has increased from 4.0 6 months ago and 2.6 1 year ago. He has baseline frequency, urgency, nocturia, hesitancy, weak stream. IPSS 15 with bother of 3. He has an occasional feeling of incomplete emptying. He is currently on flomax 0.4mg  daily. He had a UTI 5-6 years ago, no hx of prostate infections.  His brother has prostate cancer and underwent RRP 8-9 years ago and has had not recurrance.   Prostate Biopsy Procedure   Informed consent was obtained after discussing risks/benefits of the procedure.  A time out was performed to ensure correct patient identity.  Pre-Procedure: - Last PSA Level:  Lab Results  Component Value Date   PSA 5.99 (H) 02/05/2016   PSA 2.6 08/24/2014   - Gentamicin given prophylactically - Levaquin 500 mg administered PO -DRE: benign prostate  -Transrectal Ultrasound performed revealing a 49.83 g prostate. SV, capsule normal. Small median lobe noted.  -No significant hypoechoic noted  Procedure: - Prostate block performed using 10 cc 1% lidocaine and biopsies taken from sextant areas, a total of 12 under ultrasound guidance.  Post-Procedure: - Patient tolerated the procedure well - He was counseled to seek immediate medical attention if experiences any severe pain, significant bleeding, or fevers - Return in one week to discuss biopsy results

## 2016-05-09 LAB — PATHOLOGY REPORT

## 2016-05-12 ENCOUNTER — Other Ambulatory Visit: Payer: Self-pay | Admitting: Urology

## 2016-05-14 ENCOUNTER — Encounter: Payer: Self-pay | Admitting: Urology

## 2016-05-14 ENCOUNTER — Ambulatory Visit (INDEPENDENT_AMBULATORY_CARE_PROVIDER_SITE_OTHER): Payer: PPO | Admitting: Urology

## 2016-05-14 VITALS — BP 106/75 | HR 111 | Ht 69.0 in | Wt 181.0 lb

## 2016-05-14 DIAGNOSIS — C61 Malignant neoplasm of prostate: Secondary | ICD-10-CM

## 2016-05-14 NOTE — Progress Notes (Signed)
05/14/2016 2:54 PM   Ernest Sweeney March 21, 1951 [id number]  Referring provider: [name], MD [address] [place], [place] [address]  Chief Complaint  Patient presents with  . Follow-up    prostate biopsy results     HPI: The patient is a 65 year old gentleman presents today for his prostate biopsy results.   1. Prostate cancer The patient presents to discuss his prostate biopsy results which are detailed below. At baseline, he is able to obtain and maintain erections sufficient to complete intercourse. He does however state though he is not sexually active at this time and erections are not important to him.  PSA at diagnosis: 5.9 Biopsy results: Gleason 3+4 = 7 prostate cancer in 8 of 12 cores. 2 of the cores were 40-60% Gleason 3+4 = 7 prostate cancer in the right lateral mid and right lateral apex. The remainder of the cores were Gleason 3+3 = 6 prostate cancer.   His brother has prostate cancer and underwent RRP 8-9 years ago and has had not recurrance. He also has an uncle with prostate cancer.  PMH: Past Medical History:  Diagnosis Date  . Benign prostatic hypertrophy without lower urinary tract symptoms   . Colon polyp   . GERD (gastroesophageal reflux disease)   . Hyperlipidemia   . Hypertension     Surgical History: Past Surgical History:  Procedure Laterality Date  . COLONOSCOPY  2010   Dr Sula Rumple  . excision skin cyst  06-19-14   Dr. Festus Aloe CYST    Home Medications:    Medication List       Accurate as of 05/14/16  2:54 PM. Always use your most recent med list.          aspirin 81 MG tablet Take 81 mg by mouth daily.   fluticasone 50 MCG/ACT nasal spray Commonly known as:  FLONASE Place 2 sprays into both nostrils daily.   lovastatin 20 MG tablet Commonly known as:  MEVACOR Take 1 tablet (20 mg total) by mouth at bedtime.   tamsulosin 0.4 MG Caps capsule Commonly known as:  FLOMAX Take 1 capsule (0.4 mg  total) by mouth daily.   Vitamin D 2000 units Caps Take 1 capsule (2,000 Units total) by mouth daily.       Allergies: No Known Allergies  Family History: Family History  Problem Relation Age of Onset  . Heart disease Mother   . Diabetes Father   . Cancer Sister     Breast  . Cancer Brother     Prostate    Social History:  reports that he has been smoking.  He has a 10.00 pack-year smoking history. He has never used smokeless tobacco. He reports that he drinks alcohol. He reports that he does not use drugs.  ROS:                                        Physical Exam: BP 106/75   Pulse (!) 111   Ht 5\' 9"  (1.753 m)   Wt 181 lb (82.1 kg)   BMI 26.73 kg/m   Constitutional:  Alert and oriented, No acute distress. HEENT: [place] AT, moist mucus membranes.  Trachea midline, no masses. Cardiovascular: No clubbing, cyanosis, or edema. Respiratory: Normal respiratory effort, no increased work of breathing. GI: Abdomen is soft, nontender, nondistended, no abdominal masses GU: No CVA tenderness.  Skin: No rashes, bruises  or suspicious lesions. Lymph: No cervical or inguinal adenopathy. Neurologic: Grossly intact, no focal deficits, moving all 4 extremities. Psychiatric: Normal mood and affect.  Laboratory Data: Lab Results  Component Value Date   WBC 3.6 10/18/2015   HCT 43.1 10/18/2015   MCV 96 10/18/2015   PLT 323 10/18/2015    Lab Results  Component Value Date   CREATININE 1.24 10/18/2015    Lab Results  Component Value Date   PSA 5.99 (H) 02/05/2016   PSA 2.6 08/24/2014    No results found for: TESTOSTERONE  No results found for: HGBA1C  Urinalysis    Component Value Date/Time   APPEARANCEUR Clear 03/17/2016 0834   GLUCOSEU Negative 03/17/2016 0834   BILIRUBINUR Negative 03/17/2016 0834   PROTEINUR 1+ (A) 03/17/2016 0834   NITRITE Negative 03/17/2016 0834   LEUKOCYTESUR Negative 03/17/2016 0834      Assessment & Plan:    1.  Intermediate risk prostate cancer I had a long discussion with the patient regarding his new diagnosis of intermediate risk prostate cancer. We discussed all treatment approaches for prostate cancer including active surveillance, watchful waiting, robotic prostatectomy, radiation therapy, and cryotherapy. I did discuss with him given his risk stratification that he would be best served by either definitive treatment via robotic prostatectomy or radiation therapy. We discussed both of these treatment modalities in great detail. Specifically we discussed the risks, benefits, and indications of a robotic prostatectomy. We also discussed in detail the long-term risk of impotence and incontinence. I offered for him to also discuss radiation therapy in greater detail with her radiation oncologist help. Is not interested. He would like to proceed with surgery as this is what his brother had performed. I will like for him to return to the office in 2 weeks after he has a chance to read the newly diagnosed prostate cancer book so I can answer further questions for him. We can discuss surgery and even greater detail at that time. I would also like for him to attend preoperative pelvic floor physical therapy. We will arrange for this at his next visit.  Return in about 2 weeks (around 05/28/2016).  Nickie Retort, MD  [facility] [address] [place], [place] [address] [phone]

## 2016-05-21 ENCOUNTER — Ambulatory Visit (INDEPENDENT_AMBULATORY_CARE_PROVIDER_SITE_OTHER): Payer: PPO

## 2016-05-21 DIAGNOSIS — H401122 Primary open-angle glaucoma, left eye, moderate stage: Secondary | ICD-10-CM | POA: Diagnosis not present

## 2016-05-21 DIAGNOSIS — Z23 Encounter for immunization: Secondary | ICD-10-CM | POA: Diagnosis not present

## 2016-05-28 ENCOUNTER — Ambulatory Visit (INDEPENDENT_AMBULATORY_CARE_PROVIDER_SITE_OTHER): Payer: PPO | Admitting: Urology

## 2016-05-28 ENCOUNTER — Encounter: Payer: Self-pay | Admitting: Urology

## 2016-05-28 VITALS — BP 111/78 | HR 118 | Ht 69.0 in | Wt 180.7 lb

## 2016-05-28 DIAGNOSIS — C61 Malignant neoplasm of prostate: Secondary | ICD-10-CM | POA: Diagnosis not present

## 2016-05-28 LAB — MICROSCOPIC EXAMINATION: BACTERIA UA: NONE SEEN

## 2016-05-28 LAB — URINALYSIS, COMPLETE
Bilirubin, UA: NEGATIVE
Glucose, UA: NEGATIVE
KETONES UA: NEGATIVE
Nitrite, UA: NEGATIVE
Protein, UA: NEGATIVE
SPEC GRAV UA: 1.02 (ref 1.005–1.030)
Urobilinogen, Ur: 0.2 mg/dL (ref 0.2–1.0)
pH, UA: 6 (ref 5.0–7.5)

## 2016-05-28 NOTE — Progress Notes (Signed)
05/28/2016 12:06 PM   Ernest Ernest Sweeney March 17, 1951 [id number]  Referring provider: [name], MD [address] Ernest Ernest Sweeney, [name] [address]  Chief Complaint  Patient presents with  . Prostate Cancer    Follow up    HPI: The patient is a 65 year old gentleman presents today to follow up his prostate cancer diagnosis.   1. Prostate cancer The patient presents to discuss his prostate biopsy results which are detailed below. At baseline, he is able to obtain and maintain erections sufficient to complete intercourse. He does however state though he is not sexually active at this time and erections are not important to him.  PSA at diagnosis: 5.9 Biopsy results: Gleason 3+4 = 7 prostate cancer in 8 of 12 cores. 2 of the cores were 40-60% Gleason 3+4 = 7 prostate cancer in the right lateral mid and right lateral apex. The remainder of the cores were Gleason 3+3 = 6 prostate cancer.  His brother has prostate cancer and underwent RRP 8-9 years ago and has had not recurrance. He also has an uncle with prostate cancer.    He has had no abdominal surgeries before. He said a chance to think about undergoing a prostatectomy since her last visit 2 weeks ago. He continues to be interested in this procedure and has elected to proceed with robotic prostatectomy with pelvic lymph node dissection.   PMH: Past Medical History:  Diagnosis Date  . Benign prostatic hypertrophy without lower urinary tract symptoms   . Colon polyp   . GERD (gastroesophageal reflux disease)   . Hyperlipidemia   . Hypertension     Surgical History: Past Surgical History:  Procedure Laterality Date  . COLONOSCOPY  2010   Dr Ernest Ernest Sweeney  . excision skin Sweeney  06-19-14   Dr. Festus Sweeney Sweeney    Home Medications:    Medication List       Accurate as of 05/28/16 12:06 PM. Always use your most recent med list.          aspirin 81 MG tablet Take 81 mg by mouth daily.   fluticasone 50  MCG/ACT nasal spray Commonly known as:  FLONASE Place 2 sprays into both nostrils daily.   lovastatin 20 MG tablet Commonly known as:  MEVACOR Take 1 tablet (20 mg total) by mouth at bedtime.   tamsulosin 0.4 MG Caps capsule Commonly known as:  FLOMAX Take 1 capsule (0.4 mg total) by mouth daily.   Vitamin D 2000 units Caps Take 1 capsule (2,000 Units total) by mouth daily.       Allergies: No Known Allergies  Family History: Family History  Problem Relation Age of Onset  . Heart disease Mother   . Diabetes Father   . Cancer Sister     Breast  . Cancer Brother     Prostate    Social History:  reports that he has been smoking.  He has a 10.00 pack-year smoking history. He has never used smokeless tobacco. He reports that he drinks alcohol. He reports that he does not use drugs.  ROS: UROLOGY Frequent Urination?: No Hard to postpone urination?: No Burning/pain with urination?: No Get up at night to urinate?: Yes Leakage of urine?: Yes Urine stream starts and stops?: Yes Trouble starting stream?: No Do you have to strain to urinate?: No Blood in urine?: No Urinary tract infection?: No Sexually transmitted disease?: No Injury to kidneys or bladder?: No Painful intercourse?: No Weak stream?: No Erection problems?: No Penile pain?: No  Gastrointestinal Nausea?: No Vomiting?: No Indigestion/heartburn?: No Diarrhea?: No Constipation?: No  Constitutional Fever: No Night sweats?: No Weight loss?: No Fatigue?: No  Skin Skin rash/lesions?: No Itching?: No  Eyes Blurred vision?: No Double vision?: No  Ears/Nose/Throat Sore throat?: No Sinus problems?: No  Hematologic/Lymphatic Swollen glands?: No Easy bruising?: No  Cardiovascular Leg swelling?: No Chest pain?: No  Respiratory Cough?: No Shortness of breath?: No  Endocrine Excessive thirst?: No  Musculoskeletal Back pain?: No Joint pain?: No  Neurological Headaches?: No Dizziness?:  No  Psychologic Depression?: No Anxiety?: Yes  Physical Exam: BP 111/78 (BP Location: Left Arm, Patient Position: Sitting, Cuff Size: Large)   Pulse (!) 118   Ht 5\' 9"  (1.753 m)   Wt 180 lb 11.2 oz (82 kg)   BMI 26.68 kg/m   Constitutional:  Alert and oriented, No acute distress. HEENT: Ernest Sweeney AT, moist mucus membranes.  Trachea midline, no masses. Cardiovascular: No clubbing, cyanosis, or edema. Respiratory: Normal respiratory effort, no increased work of breathing. GI: Abdomen is soft, nontender, nondistended, no abdominal masses GU: No CVA tenderness.  Skin: No rashes, bruises or suspicious lesions. Lymph: No cervical or inguinal adenopathy. Neurologic: Grossly intact, no focal deficits, moving all 4 extremities. Psychiatric: Normal mood and affect.  Laboratory Data: Lab Results  Component Value Date   WBC 3.6 10/18/2015   HCT 43.1 10/18/2015   MCV 96 10/18/2015   PLT 323 10/18/2015    Lab Results  Component Value Date   CREATININE 1.24 10/18/2015    Lab Results  Component Value Date   PSA 5.99 (H) 02/05/2016   PSA 2.6 08/24/2014    No results found for: TESTOSTERONE  No results found for: HGBA1C  Urinalysis    Component Value Date/Time   APPEARANCEUR Clear 03/17/2016 0834   GLUCOSEU Negative 03/17/2016 0834   BILIRUBINUR Negative 03/17/2016 0834   PROTEINUR 1+ (A) 03/17/2016 0834   NITRITE Negative 03/17/2016 0834   LEUKOCYTESUR Negative 03/17/2016 0834     Assessment & Plan:   1. Prostate cancer Beginning discussed the treatment options for his prostate cancer. As per our previous discussion, the patient has elected to proceed with a robotic prostatectomy with pelvic lymph node dissection. (Withdrawal the risks, benefits and indications this procedure. He understands a short-term risks include bleeding, infection, iatrogenic injury, need for prolonged catheterization, DVT, PE, pneumonia, anesthetic, occasions, and death. He also understands the risk for  prolonged hospitalization. He understands and will have a Jackson-Pratt drain and a Foley catheter after surgery. He understands a catheter removal will be in place at least 7-10 days. We also again discussed the well-known long-term risks of this procedure which included incomplete cancer control, impotence, incontinence. He is not sexually active though he does have good erections at this time. He is not concerned about losing control of his erectile function. We will set him with pelvic floor physical therapy to help with postoperative incontinence for surgery. We also asked the goal of the surgery is to cure him of cancer though there is a risk that this will not be a curative operation. All questions were answered. The patient has elected to proceed with a robotic prostatectomy with pelvic lymph node dissection.    Nickie Retort, MD  [facility] [address] [place], [place] [address] [phone]

## 2016-06-08 ENCOUNTER — Telehealth: Payer: Self-pay

## 2016-06-08 NOTE — Telephone Encounter (Signed)
Patient called and spoke to Call a Nurse Hotline and states he needs refill on BP Medication. Patient MD took him off of Lisinopril months ago but since visiting Urology. His BP and HR was high. So he put himself back on Lisinopril. He has not been checking his BP since the MD appointment previously in November. Called patient on 06/08/16 at 5:07 p.m. And left message for him to call back and schedule follow up.

## 2016-06-11 ENCOUNTER — Telehealth: Payer: Self-pay | Admitting: Radiology

## 2016-06-11 ENCOUNTER — Other Ambulatory Visit: Payer: Self-pay | Admitting: Radiology

## 2016-06-11 DIAGNOSIS — C61 Malignant neoplasm of prostate: Secondary | ICD-10-CM

## 2016-06-11 NOTE — Telephone Encounter (Signed)
Notified pt of surgery scheduled with Dr Erlene Quan on 07/15/16, pre-admit testing appt on 07/03/16 @9 :00 & to call day prior to surgery for arrival time to SDS. Advised pt to hold ASA 81mg  x 7 days prior to surgery beginning on 07/08/16 per Dr Pilar Jarvis. Pt voices understanding.

## 2016-06-15 ENCOUNTER — Telehealth: Payer: Self-pay | Admitting: Urology

## 2016-06-15 ENCOUNTER — Ambulatory Visit: Payer: PPO | Attending: Urology | Admitting: Physical Therapy

## 2016-06-15 ENCOUNTER — Encounter: Payer: Self-pay | Admitting: Physical Therapy

## 2016-06-15 VITALS — BP 142/90

## 2016-06-15 DIAGNOSIS — M5440 Lumbago with sciatica, unspecified side: Secondary | ICD-10-CM | POA: Diagnosis not present

## 2016-06-15 DIAGNOSIS — M791 Myalgia, unspecified site: Secondary | ICD-10-CM

## 2016-06-15 DIAGNOSIS — G8929 Other chronic pain: Secondary | ICD-10-CM | POA: Diagnosis not present

## 2016-06-15 DIAGNOSIS — R278 Other lack of coordination: Secondary | ICD-10-CM | POA: Insufficient documentation

## 2016-06-15 NOTE — Patient Instructions (Addendum)
Proper sitting posture :feet under knees, use a pillow behind back if you need to stack ribs over pelvis . Less slouching  LOG ROLL out of bed to not strain abdomen and pelvic floor muscles   Back stretches: 2 x day   bend forward at a counter and stretch forward, and side to side 3 breaths each      PELVIC FLOOR / KEGEL EXERCISES   Pelvic floor/ Kegel exercises are used to strengthen the muscles in the base of your pelvis that are responsible for supporting your pelvic organs and preventing urine/feces leakage. Based on your therapist's recommendations, they can be performed while standing, sitting, or lying down.  Make yourself aware of this muscle group by using these cues:  Imagine you are in a crowded room and you feel the need to pass gas. Your response is to pull up and in at the rectum.  Close the rectum. Pull the muscles up inside your body,feeling your scrotum lifting as well . Feel the pelvic floor muscles lift as if you were walking into a cold lake.  Place your hand on top of your pubic bone. Tighten and draw in the muscles around the anal muscles without squeezing the buttock muscles.  Common Errors:  Breath holding: If you are holding your breath, you may be bearing down against your bladder instead of pulling it up. If you belly bulges up while you are squeezing, you are holding your breath. Be sure to breathe gently in and out while exercising. Counting out loud may help you avoid holding your breath.  Accessory muscle use: You should not see or feel other muscle movement when performing pelvic floor exercises. When done properly, no one can tell that you are performing the exercises. Keep the buttocks, belly and inner thighs relaxed.  Overdoing it: Your muscles can fatigue and stop working for you if you over-exercise. You may actually leak more or feel soreness at the lower abdomen or rectum.  YOUR HOME EXERCISE PROGRAM     SHORT HOLDS: Position: on back,  sitting   Inhale and then exhale. Then squeeze the muscle.  (Be sure to let belly sink in with exhales and not push outward)  Perform 10 repetitions, 5  Times/day  **ALSO SQUEEZE BEFORE YOUR SNEEZE, COUGH, LAUGH to decrease downward pressure   **ALSO EXHALE BEFORE YOU RISE AGAINST GRAVITY (lifting, sit to stand, from squat to stand)

## 2016-06-16 NOTE — Therapy (Signed)
[place] MAIN [facility] SERVICES [address] [place], [place], [address] Phone: [phone]   Fax:  [phone]  Physical Therapy Evaluation  Patient Details  Name: Ernest Sweeney MRN: [id number] Date of Birth: 1950-12-25 Referring Provider: Dr. Pilar Jarvis  Encounter Date: 06/15/2016      PT End of Session - 06/16/16 1329    Visit Number 1   Number of Visits 12   Date for PT Re-Evaluation 09/07/16   Authorization Type g code   PT Start Time 1110   PT Stop Time 1205   PT Time Calculation (min) 55 min   Activity Tolerance Patient tolerated treatment well;No increased pain   Behavior During Therapy WFL for tasks assessed/performed      Past Medical History:  Diagnosis Date  . Benign prostatic hypertrophy without lower urinary tract symptoms   . Colon polyp   . GERD (gastroesophageal reflux disease)   . Hyperlipidemia   . Hypertension     Past Surgical History:  Procedure Laterality Date  . COLONOSCOPY  2010   Dr Sula Rumple  . excision skin cyst  06-19-14   Dr. Festus Aloe CYST    Vitals:   06/15/16 1141  BP: (!) 142/90         Subjective Assessment - 06/15/16 1117    Subjective 1) Pt is scheduled to undergo prostate surgery 07/15/16. Pt is not experiencing any difficulty with urination at this time. Pt continue to use Flomax. Pt lives alone and is thinking about staying at a SNF after the surgery but has not spoken with any providers re: this option. Pt's other family members have limited car access and work during the day.    2) CLBP for 15-20 years: radiating pain along both legs (lateral leg to toes). 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 3hr/ day. Currently, no pain in PT session.               [facility] PT Assessment - 06/16/16 1324      Assessment   Medical Diagnosis Prostate Cancer   Referring Provider Dr. Pilar Jarvis     Precautions   Precautions None      Restrictions   Weight Bearing Restrictions No     Balance Screen   Has the patient fallen in the past 6 months No     Home Environment   Additional Comments lives alone, pt is concerned about not having any help because his sisters and step children are not available. Pt is interested in staying at a SNF if covered by ins     Prior Function   Level of Independence Independent     Observation/Other Assessments   Observations ankles under knee, slump posture     Coordination   Gross Motor Movements are Fluid and Coordinated --  abdominal straining w/ pelvic floor activation   Fine Motor Movements are Fluid and Coordinated --  chest breathing, limited lateral diaphragmatic excursion     Single Leg Stance   Comments R SLS 9 sec, L 17 sec     Sit to Stand   Comments breathholding     Posture/Postural Control   Posture Comments slight bulge in linea alba but no separation.       Palpation   Spinal mobility Increased paraspinal along lumber spine      Bed Mobility   Bed Mobility --  crunch method, straining abdomen and pelvic floor  Pelvic Floor Special Questions - 06/16/16 1329    External Perineal Exam through clothing: proper coordination with dipahragm expansion cues,  10 reps of quick contraciton in seated and laying down          [facility] Adult PT Treatment/Exercise - 06/16/16 1324      Therapeutic Activites    Therapeutic Activities --  see pt instructions                PT Education - 06/15/16 1125    Education provided Yes   Education Details POC, anatomy. physiology, goals, HEP   Person(s) Educated Patient   Methods Explanation;Demonstration;Tactile cues;Verbal cues;Handout   Comprehension Returned demonstration;Verbalized understanding             PT Long Term Goals - 06/16/16 1626      PT LONG TERM GOAL #1   Title Pt will demo proper pelvic floor coordination with proper diaphragmatic excursion in order to  perform pelvic floor exerciese   Time 12   Period Weeks   Status New     PT LONG TERM GOAL #2   Title Pt will demo decreased lumbar paraspinal mm tensions in order to decrease LBP    Time 12   Period Weeks   Status New     PT LONG TERM GOAL #3   Title Pt will demo decreased score on ODI from 8% to < 3% in order to perform work duties and ADLs.   Time 12   Period Weeks   Status New     PT LONG TERM GOAL #4   Title Pt will demo proper lifting and deep core exercises level 2-3 without deviations in order to minimzie relapse of Sx   Time 12   Period Weeks   Status New     PT LONG TERM GOAL #5   Title Pt will demo pelvic floor endurance grade 3/5/5/5 in order to decrease urianry incontinenc post surgery   Time 12   Period Weeks   Status New               Plan - 06/16/16 1330    Clinical Impression Statement Pt is a 65 yo male who was referred to Pelvic Health PT in preparation for decreasing urinary incontinence following his upcoming prostate surgery. Pt also c/o CLBP with radiating pain 2/2 physical demands at his previous jobs and current part time job. Pt presents with the following deficits that places him at further risk for urinary incontinence and LBP following surgery: dyscoordination of pelvic floor, poor body mechanics against gravity in functional activities, and increased paraspinal mm tensions.  Following training today pt demo'd proper sit to stand and out of bed t/f with less strain on his abdomen and pelvic floor. Pt also demo'd proper pelvic floor activation with quick contraction training. Initiated HEP to decrease lumbar mm tensions which pt reported to help him feel looser in his back. PT contacted referring MD and left a message with staff re: pt's concern living alone without help s/p surgery and stated his interest in staying at a SNF if it is covered by insurance. Staff voiced the message will be relayed to MD and further assist with pt's inquiry.     Rehab  Potential Good   PT Frequency 1x / week   PT Duration 12 weeks   PT Treatment/Interventions ADLs/Self Care Home Management;Manual lymph drainage;Manual techniques;Neuromuscular re-education;Patient/family education;Therapeutic activities;Therapeutic exercise;Moist Heat;Scar mobilization;Energy conservation;Taping;Passive range of motion;Cryotherapy   Consulted and Agree with Plan  of Care Patient      Patient will benefit from skilled therapeutic intervention in order to improve the following deficits and impairments:  Pain, Improper body mechanics, Decreased mobility, Decreased coordination, Increased muscle spasms, Postural dysfunction, Decreased endurance, Decreased activity tolerance, Decreased strength, Decreased balance, Decreased safety awareness, Difficulty walking, Hypomobility, Impaired flexibility  Visit Diagnosis: Other lack of coordination  Myalgia  Chronic bilateral low back pain with sciatica, sciatica laterality unspecified      G-Codes - June 23, 2016 1630    Functional Assessment Tool Used ODi 8% , clincal judgements   Functional Limitation Self care;Carrying, moving and handling objects   Carrying, Moving and Handling Objects Current Status [id number]) At least 1 percent but less than 20 percent impaired, limited or restricted   Carrying, Moving and Handling Objects Goal Status [id number]) At least 1 percent but less than 20 percent impaired, limited or restricted   Self Care Current Status [id number]) At least 20 percent but less than 40 percent impaired, limited or restricted   Self Care Goal Status [id number]) At least 1 percent but less than 20 percent impaired, limited or restricted       Problem List Patient Active Problem List   Diagnosis Date Noted  . Vitamin D deficiency 05/31/2015  . Hyperlipidemia LDL goal <100 05/31/2015  . Seasonal allergic rhinitis 05/31/2015  . Chronic pain of both shoulders 05/31/2015  . Family history of malignant neoplasm of prostate 07/26/2012  .  Hyperplasia of prostate with lower urinary tract symptoms (LUTS) 07/26/2012  . Incomplete bladder emptying 07/26/2012  . Hematuria, microscopic 07/26/2012    Jerl Mina ,PT, DPT, E-RYT  June 23, 2016, 4:32 PM  [place] MAIN [facility] SERVICES [address] [place], [place], [address] Phone: [phone]   Fax:  [phone]  Name: Ernest Sweeney MRN: [id number] Date of Birth: [birth year]

## 2016-06-23 ENCOUNTER — Telehealth: Payer: Self-pay | Admitting: Family Medicine

## 2016-06-23 NOTE — Telephone Encounter (Signed)
PT IS NEEDING FLOMAX REFILLED. SINCE HE HAS new INSURANCE HE NEEDS THIS SENT TO [id number] ON [address] PLEASE MAKE SURE AND CHANGE IN HIS CHART.

## 2016-06-24 ENCOUNTER — Other Ambulatory Visit: Payer: Self-pay | Admitting: Family Medicine

## 2016-06-24 MED ORDER — TAMSULOSIN HCL 0.4 MG PO CAPS: 0.4000 mg | ORAL_CAPSULE | Freq: Every day | ORAL | 0 refills | Status: DC

## 2016-06-24 NOTE — Telephone Encounter (Signed)
done

## 2016-06-26 ENCOUNTER — Ambulatory Visit: Payer: PPO | Admitting: Physical Therapy

## 2016-06-26 DIAGNOSIS — M791 Myalgia, unspecified site: Secondary | ICD-10-CM

## 2016-06-26 DIAGNOSIS — R278 Other lack of coordination: Secondary | ICD-10-CM

## 2016-06-26 DIAGNOSIS — G8929 Other chronic pain: Secondary | ICD-10-CM

## 2016-06-26 DIAGNOSIS — M5440 Lumbago with sciatica, unspecified side: Secondary | ICD-10-CM

## 2016-06-26 NOTE — Therapy (Signed)
[place] MAIN [facility] SERVICES [address] [place], [place], [address] Phone: [phone]   Fax:  [phone]  Physical Therapy Treatment  Patient Details  Name: Ernest Sweeney MRN: [id number] Date of Birth: 03-Oct-1950 Referring Provider: Dr. Pilar Jarvis  Encounter Date: 06/26/2016      PT End of Session - 06/26/16 2150    Visit Number 2   Number of Visits 12   Date for PT Re-Evaluation 09/07/16   Authorization Type g code   PT Start Time 1005   PT Stop Time 1100   PT Time Calculation (min) 55 min   Activity Tolerance Patient tolerated treatment well;No increased pain   Behavior During Therapy WFL for tasks assessed/performed      Past Medical History:  Diagnosis Date  . Benign prostatic hypertrophy without lower urinary tract symptoms   . Colon polyp   . GERD (gastroesophageal reflux disease)   . Hyperlipidemia   . Hypertension     Past Surgical History:  Procedure Laterality Date  . COLONOSCOPY  2010   Dr Sula Rumple  . excision skin cyst  06-19-14   Dr. Festus Aloe CYST    There were no vitals filed for this visit.      Subjective Assessment - 06/26/16 1009    Subjective Pt practiced the pelvic floor exercise 1x day.             [facility] PT Assessment - 06/26/16 2149      Palpation   Spinal mobility L lumbar mm more tense > R. (decreased post Tx)                   Pelvic Floor Special Questions - 06/26/16 2148    External Perineal Exam through clothing: proper coordination with dipahragm expansion cues,  10 reps of quick contraction in seated and laying down, endurance training 3 sec, 3 reps             [facility] Adult PT Treatment/Exercise - 06/26/16 2135      Therapeutic Activites    Therapeutic Activities --  see pt instructions     Moist Heat Therapy   Number Minutes Moist Heat 8 Minutes   Moist Heat Location --  skin intact post Tx                PT Education - 06/26/16 1020    Education provided Yes   Education Details HEP   Person(s) Educated Patient   Methods Explanation;Demonstration;Tactile cues;Verbal cues;Handout   Comprehension Returned demonstration;Verbalized understanding             PT Long Term Goals - 06/26/16 2154      PT LONG TERM GOAL #1   Title Pt will demo proper pelvic floor coordination with proper diaphragmatic excursion in order to perform pelvic floor exerciese   Time 12   Period Weeks   Status Achieved     PT LONG TERM GOAL #2   Title Pt will demo decreased lumbar paraspinal mm tensions in order to decrease LBP    Time 12   Period Weeks   Status Achieved     PT LONG TERM GOAL #3   Title Pt will demo decreased score on ODI from 8% to < 3% in order to perform work duties and ADLs.   Time 12   Period Weeks   Status On-going     PT LONG TERM GOAL #4   Title Pt will demo proper lifting and deep core exercises  level 2-3 without deviations in order to minimzie relapse of Sx   Time 12   Period Weeks   Status Achieved     PT LONG TERM GOAL #5   Title Pt will demo pelvic floor endurance grade 3/5/5/5 in order to decrease urinary incontinenc post surgery   Time 12   Period Weeks   Status On-going               Plan - 06/26/16 1057    Clinical Impression Statement Pt reoprted 80% better with his LBP following Tx today.  Pt demo'd decreased lumbar mm tensions on L and was able to progress to endurance training with pelvic floor mm strengthening. Pt voiced understanding of performing pelvic floor mm exercises up to surgery date and after catheter has been removed post surgery. Pt required cuing for log rolling technique to minimize strain on abdominal mm. Pt to return to PT if he continues to require pelvic floor trianing to minimize urinary incontinence post -surgery.      Rehab Potential Good   PT Frequency 1x / week   PT Duration 12 weeks   PT Treatment/Interventions ADLs/Self Care Home Management;Manual lymph  drainage;Manual techniques;Neuromuscular re-education;Patient/family education;Therapeutic activities;Therapeutic exercise;Moist Heat;Scar mobilization;Energy conservation;Taping;Passive range of motion;Cryotherapy   Consulted and Agree with Plan of Care Patient      Patient will benefit from skilled therapeutic intervention in order to improve the following deficits and impairments:  Pain, Improper body mechanics, Decreased mobility, Decreased coordination, Increased muscle spasms, Postural dysfunction, Decreased endurance, Decreased activity tolerance, Decreased strength, Decreased balance, Decreased safety awareness, Difficulty walking, Hypomobility, Impaired flexibility  Visit Diagnosis: Myalgia  Chronic bilateral low back pain with sciatica, sciatica laterality unspecified  Other lack of coordination     Problem List Patient Active Problem List   Diagnosis Date Noted  . Vitamin D deficiency 05/31/2015  . Hyperlipidemia LDL goal <100 05/31/2015  . Seasonal allergic rhinitis 05/31/2015  . Chronic pain of both shoulders 05/31/2015  . Family history of malignant neoplasm of prostate 07/26/2012  . Hyperplasia of prostate with lower urinary tract symptoms (LUTS) 07/26/2012  . Incomplete bladder emptying 07/26/2012  . Hematuria, microscopic 07/26/2012    Jerl Mina ,PT, DPT, E-RYT  06/26/2016, 9:55 PM  [place] MAIN [facility] SERVICES [address] [place], [place], [address] Phone: [phone]   Fax:  [phone]  Name: Ernest Sweeney MRN: [id number] Date of Birth: [birth year]

## 2016-06-26 NOTE — Patient Instructions (Addendum)
PELVIC FLOOR / KEGEL EXERCISES   Pelvic floor/ Kegel exercises are used to strengthen the muscles in the base of your pelvis that are responsible for supporting your pelvic organs and preventing urine/feces leakage. Based on your therapist's recommendations, they can be performed while standing, sitting, or lying down.  Make yourself aware of this muscle group by using these cues:  Imagine you are in a crowded room and you feel the need to pass gas. Your response is to pull up and in at the rectum.  Close the rectum. Pull the muscles up inside your body,feeling your scrotum lifting as well . Feel the pelvic floor muscles lift as if you were walking into a cold lake.  Place your hand on top of your pubic bone. Tighten and draw in the muscles around the anal muscles without squeezing the buttock muscles.  Common Errors:  Breath holding: If you are holding your breath, you may be bearing down against your bladder instead of pulling it up. If you belly bulges up while you are squeezing, you are holding your breath. Be sure to breathe gently in and out while exercising. Counting out loud may help you avoid holding your breath.  Accessory muscle use: You should not see or feel other muscle movement when performing pelvic floor exercises. When done properly, no one can tell that you are performing the exercises. Keep the buttocks, belly and inner thighs relaxed.  Overdoing it: Your muscles can fatigue and stop working for you if you over-exercise. You may actually leak more or feel soreness at the lower abdomen or rectum.  YOUR HOME EXERCISE PROGRAM  ONLY AFTER YOUR CATHETER HAS BEEN REMOVED  LONG HOLDS: Position: on back  Inhale and then exhale. Then squeeze the muscle and count aloud for 3 seconds. Rest with three long breaths. (Be sure to let belly sink in with exhales and not push outward)  Perform 3 repetitions, 3 times/day   (gradually progress number repetitions after 1-2 weeks until you  achieve 10 reps).   Continue to perform through the years after surgery      SHORT HOLDS: Position: on back, sitting   Inhale and then exhale. Then squeeze the muscle.  (Be sure to let belly sink in with exhales and not push outward)  _________________________   Dennis Bast are now ready to begin training the deep core muscles system: diaphragm, transverse abdominis, pelvic floor . These muscles must work together as a team.         The key to these exercises to train the brain to coordinate the timing of these muscles and to have them turn on for long periods of time to hold you upright against gravity (especially important if you are on your feet all day).These muscles are postural muscles and play a role stabilizing your spine and bodyweight. By doing these repetitions slowly and correctly instead of doing crunches, you will achieve a flatter belly without a lower pooch. You are also placing your spine in a more neutral position and breathing properly which in turn, decreases your risk for problems related to your pelvic floor, abdominal, and low back such as pelvic organ prolapse, hernias, diastasis recti (separation of superficial muscles), disk herniations, spinal fractures. These exercises set a solid foundation for you to later progress to resistance/ strength training with therabands and weights and return to other typical fitness exercises with a stronger deeper core.   Do level 1 : 10 reps Do level 2: 10 reps (left and right =  1 rep) x 3 sets , 2 x day Do not progress to level 3 for 3-4 weeks. You know you are ready when you do not have any rocking of pelvis nor arching in your back     Perform 5 repetitions, 5  Times/day  **ALSO SQUEEZE BEFORE YOUR SNEEZE, COUGH, LAUGH to decrease downward pressure   **ALSO EXHALE BEFORE YOU RISE AGAINST GRAVITY (lifting, sit to stand, from squat to stand)    _____________________  Back stretches:  Open book (laying on R side ) pillow  between knees and behind back  15 reps       Knees to chest with towel under thighs on the back         Twist:  Scoot hips to the Left, drop knees to the Right with pillow between knees and under R thighs        Childs pose rocking and stretch L side with L hand on R hand         Elevate knees and feet onto pillows to lengthen the back

## 2016-07-03 ENCOUNTER — Encounter
Admission: RE | Admit: 2016-07-03 | Discharge: 2016-07-03 | Disposition: A | Discharge status: Home / Self Care | Payer: PPO | Source: Ambulatory Visit | Attending: Urology | Admitting: Urology

## 2016-07-03 ENCOUNTER — Telehealth: Payer: Self-pay | Admitting: Family Medicine

## 2016-07-03 DIAGNOSIS — Z01812 Encounter for preprocedural laboratory examination: Secondary | ICD-10-CM | POA: Diagnosis not present

## 2016-07-03 DIAGNOSIS — I10 Essential (primary) hypertension: Secondary | ICD-10-CM | POA: Diagnosis not present

## 2016-07-03 DIAGNOSIS — Z01810 Encounter for preprocedural cardiovascular examination: Secondary | ICD-10-CM | POA: Insufficient documentation

## 2016-07-03 HISTORY — DX: Malignant (primary) neoplasm, unspecified: C80.1

## 2016-07-03 HISTORY — DX: Unspecified osteoarthritis, unspecified site: M19.90

## 2016-07-03 HISTORY — DX: Unspecified glaucoma: H40.9

## 2016-07-03 HISTORY — DX: Anxiety disorder, unspecified: F41.9

## 2016-07-03 LAB — TYPE AND SCREEN
ABO/RH(D): A POS
ANTIBODY SCREEN: NEGATIVE

## 2016-07-03 LAB — CBC
HEMATOCRIT: 45.1 % (ref 40.0–52.0)
HEMOGLOBIN: 15.4 g/dL (ref 13.0–18.0)
MCH: 32.8 pg (ref 26.0–34.0)
MCHC: 34.1 g/dL (ref 32.0–36.0)
MCV: 96.1 fL (ref 80.0–100.0)
Platelets: 324 10*3/uL (ref 150–440)
RBC: 4.69 MIL/uL (ref 4.40–5.90)
RDW: 13.6 % (ref 11.5–14.5)
WBC: 3.6 10*3/uL — ABNORMAL LOW (ref 3.8–10.6)

## 2016-07-03 LAB — BASIC METABOLIC PANEL
ANION GAP: 6 (ref 5–15)
BUN: 13 mg/dL (ref 6–20)
CO2: 24 mmol/L (ref 22–32)
Calcium: 9.6 mg/dL (ref 8.9–10.3)
Chloride: 106 mmol/L (ref 101–111)
Creatinine, Ser: 1.02 mg/dL (ref 0.61–1.24)
GFR calc Af Amer: >60 mL/min (ref >60)
GLUCOSE: 81 mg/dL (ref 65–99)
POTASSIUM: 4.2 mmol/L (ref 3.5–5.1)
Sodium: 136 mmol/L (ref 135–145)

## 2016-07-03 LAB — PROTIME-INR
INR: 0.96
Prothrombin Time: 12.8 seconds (ref 11.4–15.2)

## 2016-07-03 LAB — APTT: aPTT: 43 seconds — ABNORMAL HIGH (ref 24–36)

## 2016-07-03 NOTE — Patient Instructions (Signed)
  Your procedure is scheduled on: July 15, 2016 (Wednesday) Report to Same Day Surgery 2nd floor medical mall [facility] Entrance-take elevator on left to 2nd floor.  Check in with surgery information desk.) To find out your arrival time please call [phone] between 1PM - 3PM on July 14, 2016 (Tuesday)  Remember: Instructions that are not followed completely may result in serious medical risk, up to and including death, or upon the discretion of your surgeon and anesthesiologist your surgery may need to be rescheduled.    _x___ 1. Do not eat food or drink liquids after midnight. No gum chewing or hard candies.     __x__ 2. No Alcohol for 24 hours before or after surgery.   __x__3. No Smoking for 24 prior to surgery.   ____  4. Bring all medications with you on the day of surgery if instructed.    __x__ 5. Notify your doctor if there is any change in your medical condition     (cold, fever, infections).     Do not wear jewelry, make-up, hairpins, clips or nail polish.  Do not wear lotions, powders, or perfumes. You may wear deodorant.  Do not shave 48 hours prior to surgery. Men may shave face and neck.  Do not bring valuables to the hospital.    [facility] is not responsible for any belongings or valuables.               Contacts, dentures or bridgework may not be worn into surgery.  Leave your suitcase in the car. After surgery it may be brought to your room.  For patients admitted to the hospital, discharge time is determined by your treatment team.   Patients discharged the day of surgery will not be allowed to drive home.  You will need someone to drive you home and stay with you the night of your procedure.    Please read over the following fact sheets that you were given:   [facility] Preparing for Surgery and or MRSA Information   _x___ Take these medicines the morning of surgery with A SIP OF WATER:    1. FOLLOW DR. Pilar Jarvis INSTRUCTION SHEET FOR  SURGERY  2.  3.  4.  5.  6.  ____Fleets enema or Magnesium Citrate as directed.   ___ Use CHG Soap or sage wipes as directed on instruction sheet   ____ Use inhalers on the day of surgery and bring to hospital day of surgery  ____ Stop metformin 2 days prior to surgery    ____ Take 1/2 of usual insulin dose the night before surgery and none on the morning of           surgery.   __x__ Stop Aspirin, Coumadin, Pllavix ,Eliquis, Effient, or Pradaxa (Stop Aspirin on December 27 as instructed by Dr. Pilar Jarvis office)  x__ Stop Anti-inflammatories such as Advil, Aleve, Ibuprofen, Motrin, Naproxen,          Naprosyn, Goodies powders or aspirin products. Ok to take Tylenol.   ____ Stop supplements until after surgery.    ____ Bring C-Pap to the hospital.

## 2016-07-03 NOTE — Telephone Encounter (Signed)
WE SCHEDULED THE PATIENT AN FIRST AVAIL APPT AND THAT IS ON JAN 23RD. IT HAS BEEN RUNNING AROUND 142/90 AND THAT WAS WHAT IT WAS TODAY.

## 2016-07-03 NOTE — Telephone Encounter (Signed)
Per the request of Dr. [name], I contacted this patient to inform him that she did not think his BP was elevated, although it was 142/90. He was informed that certain factors such as: pain, stress, tobacco products, caffeine, decongestants and/or cough syrups could alter the reading and have it slightly increased but that the reading he had was ok. He was encouraged to monitor it until his f/u appt and that if it did exceed 140/90 to go to his nearest urgent care of ER.

## 2016-07-03 NOTE — Telephone Encounter (Signed)
Monitor bp at home 140's/90's and we will resume during his follow up

## 2016-07-03 NOTE — Telephone Encounter (Signed)
Pt said that the dr had taken him off his blood pressure meds, but his blood pressure is back up again and he just came from another dr appt and they told him to get in contact with his pcp dr and see about putting him back on his blood pressure meds. Pharm Is [facility] on [address].

## 2016-07-04 LAB — URINE CULTURE: Culture: NO GROWTH

## 2016-07-08 ENCOUNTER — Other Ambulatory Visit: Payer: Self-pay

## 2016-07-15 ENCOUNTER — Inpatient Hospital Stay: Payer: PPO | Admitting: Certified Registered"

## 2016-07-15 ENCOUNTER — Encounter: Payer: Self-pay | Admitting: *Deleted

## 2016-07-15 ENCOUNTER — Encounter
Admission: RE | Disposition: A | Discharge status: Home / Self Care | Payer: Self-pay | Source: Ambulatory Visit | Attending: Urology

## 2016-07-15 ENCOUNTER — Inpatient Hospital Stay
Admission: RE | Admit: 2016-07-15 | Discharge: 2016-07-16 | DRG: 708 | Disposition: A | Discharge status: Home / Self Care | Payer: PPO | Source: Ambulatory Visit | Attending: Urology | Admitting: Urology

## 2016-07-15 DIAGNOSIS — G8929 Other chronic pain: Secondary | ICD-10-CM | POA: Diagnosis present

## 2016-07-15 DIAGNOSIS — I10 Essential (primary) hypertension: Secondary | ICD-10-CM | POA: Diagnosis present

## 2016-07-15 DIAGNOSIS — Z8546 Personal history of malignant neoplasm of prostate: Secondary | ICD-10-CM | POA: Diagnosis present

## 2016-07-15 DIAGNOSIS — N400 Enlarged prostate without lower urinary tract symptoms: Secondary | ICD-10-CM | POA: Diagnosis not present

## 2016-07-15 DIAGNOSIS — E785 Hyperlipidemia, unspecified: Secondary | ICD-10-CM | POA: Diagnosis present

## 2016-07-15 DIAGNOSIS — K219 Gastro-esophageal reflux disease without esophagitis: Secondary | ICD-10-CM | POA: Diagnosis present

## 2016-07-15 DIAGNOSIS — C61 Malignant neoplasm of prostate: Secondary | ICD-10-CM | POA: Diagnosis not present

## 2016-07-15 DIAGNOSIS — Z9079 Acquired absence of other genital organ(s): Secondary | ICD-10-CM | POA: Insufficient documentation

## 2016-07-15 DIAGNOSIS — F17200 Nicotine dependence, unspecified, uncomplicated: Secondary | ICD-10-CM | POA: Diagnosis present

## 2016-07-15 DIAGNOSIS — Z8042 Family history of malignant neoplasm of prostate: Secondary | ICD-10-CM | POA: Diagnosis not present

## 2016-07-15 DIAGNOSIS — Z79899 Other long term (current) drug therapy: Secondary | ICD-10-CM

## 2016-07-15 DIAGNOSIS — Z7982 Long term (current) use of aspirin: Secondary | ICD-10-CM | POA: Diagnosis not present

## 2016-07-15 HISTORY — PX: PELVIC LYMPH NODE DISSECTION: SHX6543

## 2016-07-15 HISTORY — PX: ROBOT ASSISTED LAPAROSCOPIC RADICAL PROSTATECTOMY: SHX5141

## 2016-07-15 LAB — APTT: aPTT: 39 seconds — ABNORMAL HIGH (ref 24–36)

## 2016-07-15 LAB — ABO/RH: ABO/RH(D): A POS

## 2016-07-15 SURGERY — ROBOTIC ASSISTED LAPAROSCOPIC RADICAL PROSTATECTOMY
Anesthesia: General | Site: Abdomen | Wound class: Clean Contaminated

## 2016-07-15 MED ORDER — FENTANYL CITRATE (PF) 100 MCG/2ML IJ SOLN
INTRAMUSCULAR | Status: AC
  Administered 2016-07-15: 50 ug via INTRAVENOUS
  Filled 2016-07-15: qty 2

## 2016-07-15 MED ORDER — CEFAZOLIN SODIUM-DEXTROSE 2-4 GM/100ML-% IV SOLN
2.0000 g | INTRAVENOUS | Status: AC
  Administered 2016-07-15: 2 g via INTRAVENOUS

## 2016-07-15 MED ORDER — PHENYLEPHRINE 40 MCG/ML (10ML) SYRINGE FOR IV PUSH (FOR BLOOD PRESSURE SUPPORT)
PREFILLED_SYRINGE | INTRAVENOUS | Status: AC
  Filled 2016-07-15: qty 10

## 2016-07-15 MED ORDER — PROPOFOL 10 MG/ML IV BOLUS
INTRAVENOUS | Status: DC | PRN
  Administered 2016-07-15: 150 mg via INTRAVENOUS

## 2016-07-15 MED ORDER — THROMBIN 5000 UNITS EX SOLR
CUTANEOUS | Status: DC | PRN
  Administered 2016-07-15: 5000 [IU] via TOPICAL

## 2016-07-15 MED ORDER — ONDANSETRON HCL 4 MG/2ML IJ SOLN
INTRAMUSCULAR | Status: AC
  Filled 2016-07-15: qty 2

## 2016-07-15 MED ORDER — FENTANYL CITRATE (PF) 100 MCG/2ML IJ SOLN
25.0000 ug | INTRAMUSCULAR | Status: DC | PRN
  Administered 2016-07-15 (×3): 50 ug via INTRAVENOUS

## 2016-07-15 MED ORDER — LIDOCAINE 2% (20 MG/ML) 5 ML SYRINGE
INTRAMUSCULAR | Status: AC
  Filled 2016-07-15: qty 5

## 2016-07-15 MED ORDER — PROMETHAZINE HCL 25 MG/ML IJ SOLN: 6.2500 mg | INTRAMUSCULAR | Status: DC | PRN

## 2016-07-15 MED ORDER — BUPIVACAINE-EPINEPHRINE (PF) 0.25% -1:200000 IJ SOLN
INTRAMUSCULAR | Status: DC | PRN
Start: 2016-07-15 — End: 2016-07-15
  Administered 2016-07-15: 10 mL via PERINEURAL

## 2016-07-15 MED ORDER — BUPIVACAINE-EPINEPHRINE (PF) 0.25% -1:200000 IJ SOLN
INTRAMUSCULAR | Status: AC
  Filled 2016-07-15: qty 30

## 2016-07-15 MED ORDER — MORPHINE SULFATE (PF) 4 MG/ML IV SOLN
2.0000 mg | INTRAVENOUS | Status: DC | PRN
  Administered 2016-07-15: 4 mg via INTRAVENOUS
  Filled 2016-07-15: qty 1

## 2016-07-15 MED ORDER — EPHEDRINE 5 MG/ML INJ
INTRAVENOUS | Status: AC
  Filled 2016-07-15: qty 10

## 2016-07-15 MED ORDER — SUCCINYLCHOLINE CHLORIDE 20 MG/ML IJ SOLN
INTRAMUSCULAR | Status: DC | PRN
  Administered 2016-07-15: 100 mg via INTRAVENOUS

## 2016-07-15 MED ORDER — LACTATED RINGERS IV SOLN
INTRAVENOUS | Status: DC
  Administered 2016-07-15 (×2): via INTRAVENOUS

## 2016-07-15 MED ORDER — DOCUSATE SODIUM 100 MG PO CAPS
100.0000 mg | ORAL_CAPSULE | Freq: Two times a day (BID) | ORAL | Status: DC
  Administered 2016-07-15 – 2016-07-16 (×3): 100 mg via ORAL
  Filled 2016-07-15 (×3): qty 1

## 2016-07-15 MED ORDER — LACTATED RINGERS IV SOLN
INTRAVENOUS | Status: DC | PRN
  Administered 2016-07-15: 08:00:00 via INTRAVENOUS

## 2016-07-15 MED ORDER — SUCCINYLCHOLINE CHLORIDE 200 MG/10ML IV SOSY
PREFILLED_SYRINGE | INTRAVENOUS | Status: AC
  Filled 2016-07-15: qty 10

## 2016-07-15 MED ORDER — CEFAZOLIN SODIUM-DEXTROSE 2-4 GM/100ML-% IV SOLN
2.0000 g | Freq: Three times a day (TID) | INTRAVENOUS | Status: AC
  Administered 2016-07-15 (×2): 2 g via INTRAVENOUS
  Filled 2016-07-15 (×3): qty 100

## 2016-07-15 MED ORDER — LACTATED RINGERS IV SOLN
INTRAVENOUS | Status: DC | PRN
  Administered 2016-07-15: 400 mL

## 2016-07-15 MED ORDER — ROCURONIUM BROMIDE 50 MG/5ML IV SOSY
PREFILLED_SYRINGE | INTRAVENOUS | Status: AC
  Filled 2016-07-15: qty 5

## 2016-07-15 MED ORDER — HYDROMORPHONE HCL 1 MG/ML IJ SOLN: 0.2500 mg | INTRAMUSCULAR | Status: DC | PRN

## 2016-07-15 MED ORDER — HEPARIN SODIUM (PORCINE) 5000 UNIT/ML IJ SOLN: 5000.0000 [IU] | Freq: Three times a day (TID) | INTRAMUSCULAR | Status: DC

## 2016-07-15 MED ORDER — LATANOPROST 0.005 % OP SOLN
1.0000 [drp] | Freq: Every day | OPHTHALMIC | Status: DC
  Administered 2016-07-15: 1 [drp] via OPHTHALMIC
  Filled 2016-07-15 (×2): qty 2.5

## 2016-07-15 MED ORDER — FENTANYL CITRATE (PF) 100 MCG/2ML IJ SOLN
INTRAMUSCULAR | Status: AC
  Filled 2016-07-15: qty 2

## 2016-07-15 MED ORDER — ONDANSETRON HCL 4 MG/2ML IJ SOLN
4.0000 mg | INTRAMUSCULAR | Status: DC | PRN
  Administered 2016-07-15: 4 mg via INTRAVENOUS
  Filled 2016-07-15: qty 2

## 2016-07-15 MED ORDER — FENTANYL CITRATE (PF) 250 MCG/5ML IJ SOLN
INTRAMUSCULAR | Status: AC
  Filled 2016-07-15: qty 5

## 2016-07-15 MED ORDER — HYDROCODONE-ACETAMINOPHEN 5-325 MG PO TABS
1.0000 | ORAL_TABLET | ORAL | Status: DC | PRN
  Administered 2016-07-16: 2 via ORAL
  Filled 2016-07-15: qty 2

## 2016-07-15 MED ORDER — FAMOTIDINE 20 MG PO TABS
ORAL_TABLET | ORAL | Status: AC
  Filled 2016-07-15: qty 1

## 2016-07-15 MED ORDER — MIDAZOLAM HCL 2 MG/2ML IJ SOLN
INTRAMUSCULAR | Status: AC
  Filled 2016-07-15: qty 2

## 2016-07-15 MED ORDER — HEPARIN SODIUM (PORCINE) 5000 UNIT/ML IJ SOLN
INTRAMUSCULAR | Status: AC
  Filled 2016-07-15: qty 1

## 2016-07-15 MED ORDER — ROCURONIUM BROMIDE 100 MG/10ML IV SOLN
INTRAVENOUS | Status: DC | PRN
  Administered 2016-07-15: 5 mg via INTRAVENOUS
  Administered 2016-07-15: 30 mg via INTRAVENOUS
  Administered 2016-07-15: 45 mg via INTRAVENOUS
  Administered 2016-07-15 (×4): 30 mg via INTRAVENOUS

## 2016-07-15 MED ORDER — PHENYLEPHRINE HCL 10 MG/ML IJ SOLN
INTRAMUSCULAR | Status: DC | PRN
  Administered 2016-07-15: 40 ug via INTRAVENOUS
  Administered 2016-07-15: 80 ug via INTRAVENOUS
  Administered 2016-07-15 (×4): 40 ug via INTRAVENOUS
  Administered 2016-07-15: 80 ug via INTRAVENOUS
  Administered 2016-07-15: 40 ug via INTRAVENOUS
  Administered 2016-07-15: 80 ug via INTRAVENOUS

## 2016-07-15 MED ORDER — FENTANYL CITRATE (PF) 100 MCG/2ML IJ SOLN
50.0000 ug | Freq: Once | INTRAMUSCULAR | Status: AC
  Administered 2016-07-15: 50 ug via INTRAVENOUS

## 2016-07-15 MED ORDER — ACETAMINOPHEN 10 MG/ML IV SOLN
INTRAVENOUS | Status: AC
  Filled 2016-07-15: qty 100

## 2016-07-15 MED ORDER — MIDAZOLAM HCL 2 MG/2ML IJ SOLN
INTRAMUSCULAR | Status: DC | PRN
  Administered 2016-07-15: 2 mg via INTRAVENOUS

## 2016-07-15 MED ORDER — ROCURONIUM BROMIDE 50 MG/5ML IV SOSY
PREFILLED_SYRINGE | INTRAVENOUS | Status: AC
Start: 2016-07-15 — End: 2016-07-15
  Filled 2016-07-15: qty 5

## 2016-07-15 MED ORDER — PHENYLEPHRINE 40 MCG/ML (10ML) SYRINGE FOR IV PUSH (FOR BLOOD PRESSURE SUPPORT)
PREFILLED_SYRINGE | INTRAVENOUS | Status: AC
Start: 2016-07-15 — End: 2016-07-15
  Filled 2016-07-15: qty 10

## 2016-07-15 MED ORDER — FAMOTIDINE 20 MG PO TABS
20.0000 mg | ORAL_TABLET | Freq: Once | ORAL | Status: AC
Start: 2016-07-15 — End: 2016-07-15
  Administered 2016-07-15: 20 mg via ORAL

## 2016-07-15 MED ORDER — SODIUM CHLORIDE 0.9 % IV SOLN
INTRAVENOUS | Status: DC | PRN
  Administered 2016-07-15: 10 ug/min via INTRAVENOUS

## 2016-07-15 MED ORDER — ACETAMINOPHEN 10 MG/ML IV SOLN
INTRAVENOUS | Status: DC | PRN
  Administered 2016-07-15: 1000 mg via INTRAVENOUS

## 2016-07-15 MED ORDER — ONDANSETRON HCL 4 MG/2ML IJ SOLN
INTRAMUSCULAR | Status: DC | PRN
Start: 2016-07-15 — End: 2016-07-15
  Administered 2016-07-15: 4 mg via INTRAVENOUS

## 2016-07-15 MED ORDER — FENTANYL CITRATE (PF) 100 MCG/2ML IJ SOLN
INTRAMUSCULAR | Status: DC | PRN
  Administered 2016-07-15: 50 ug via INTRAVENOUS
  Administered 2016-07-15: 150 ug via INTRAVENOUS
  Administered 2016-07-15 (×2): 25 ug via INTRAVENOUS
  Administered 2016-07-15: 50 ug via INTRAVENOUS

## 2016-07-15 MED ORDER — CEFAZOLIN SODIUM-DEXTROSE 2-4 GM/100ML-% IV SOLN
INTRAVENOUS | Status: AC
  Filled 2016-07-15: qty 100

## 2016-07-15 MED ORDER — LIDOCAINE HCL (CARDIAC) 20 MG/ML IV SOLN
INTRAVENOUS | Status: DC | PRN
Start: 2016-07-15 — End: 2016-07-15
  Administered 2016-07-15: 100 mg via INTRAVENOUS

## 2016-07-15 MED ORDER — HEPARIN SODIUM (PORCINE) 5000 UNIT/ML IJ SOLN
5000.0000 [IU] | Freq: Three times a day (TID) | INTRAMUSCULAR | Status: DC
  Administered 2016-07-15 – 2016-07-16 (×2): 5000 [IU] via SUBCUTANEOUS
  Filled 2016-07-15 (×2): qty 1

## 2016-07-15 MED ORDER — PRAVASTATIN SODIUM 20 MG PO TABS
20.0000 mg | ORAL_TABLET | Freq: Every day | ORAL | Status: DC
  Administered 2016-07-15: 20 mg via ORAL
  Filled 2016-07-15: qty 1

## 2016-07-15 MED ORDER — PROPOFOL 10 MG/ML IV BOLUS
INTRAVENOUS | Status: AC
  Filled 2016-07-15: qty 20

## 2016-07-15 MED ORDER — SODIUM CHLORIDE 0.9 % IV SOLN
INTRAVENOUS | Status: DC
  Administered 2016-07-15 – 2016-07-16 (×3): via INTRAVENOUS

## 2016-07-15 MED ORDER — FAMOTIDINE 20 MG PO TABS
ORAL_TABLET | ORAL | Status: AC
  Administered 2016-07-15: 20 mg via ORAL
  Filled 2016-07-15: qty 1

## 2016-07-15 MED ORDER — SUGAMMADEX SODIUM 200 MG/2ML IV SOLN
INTRAVENOUS | Status: DC | PRN
  Administered 2016-07-15: 165 mg via INTRAVENOUS

## 2016-07-15 MED ORDER — SUGAMMADEX SODIUM 200 MG/2ML IV SOLN
INTRAVENOUS | Status: AC
  Filled 2016-07-15: qty 2

## 2016-07-15 MED ORDER — THROMBIN 5000 UNITS EX SOLR
CUTANEOUS | Status: AC
  Filled 2016-07-15: qty 5000

## 2016-07-15 SURGICAL SUPPLY — 115 items
ADHESIVE MASTISOL STRL (MISCELLANEOUS) IMPLANT
ANCHOR TIS RET SYS 235ML (MISCELLANEOUS) IMPLANT
APPLICATOR SURGIFLO (MISCELLANEOUS) ×2 IMPLANT
APPLIER CLIP LOGIC TI 5 (MISCELLANEOUS) IMPLANT
BAG URINE DRAINAGE (UROLOGICAL SUPPLIES) ×2 IMPLANT
BAG URO DRAIN 2000ML W/SPOUT (MISCELLANEOUS) ×2 IMPLANT
BASIN GRAD PLASTIC 32OZ STRL (MISCELLANEOUS) IMPLANT
BLADE CLIPPER SURG (BLADE) ×2 IMPLANT
BULB RESERV EVAC DRAIN JP 100C (MISCELLANEOUS) IMPLANT
CANISTER SUCT 1200ML W/VALVE (MISCELLANEOUS) ×2 IMPLANT
CATH DRAINAGE MALECOT 26FR (CATHETERS) IMPLANT
CATH FOL 2WAY LX 18X5 (CATHETERS) ×2 IMPLANT
CATH MALECOT (CATHETERS)
CHLORAPREP W/TINT 26ML (MISCELLANEOUS) ×4 IMPLANT
CLIP LIGATING HEM O LOK PURPLE (MISCELLANEOUS) ×8 IMPLANT
CLIP LIGATING HEMO O LOK GREEN (MISCELLANEOUS) ×4 IMPLANT
CLIP SUT LAPRA TY ABSORB (SUTURE) IMPLANT
CORD BIP STRL DISP 12FT (MISCELLANEOUS) ×2 IMPLANT
COVER MAYO STAND STRL (DRAPES) IMPLANT
COVER TIP SHEARS 8 DVNC (MISCELLANEOUS) ×1 IMPLANT
COVER TIP SHEARS 8MM DA VINCI (MISCELLANEOUS) ×1
CUTTER ECHEON FLEX ENDO 45 340 (ENDOMECHANICALS) IMPLANT
DEFOGGER SCOPE WARMER CLEARIFY (MISCELLANEOUS) ×2 IMPLANT
DRAIN CHANNEL JP 15F RND 16 (MISCELLANEOUS) IMPLANT
DRAIN CHANNEL JP 19F (MISCELLANEOUS) IMPLANT
DRAPE CAMERA ARM DAVINCI SIROB (DRAPES) ×2 IMPLANT
DRAPE LEGGINS SURG 28X43 STRL (DRAPES) ×2 IMPLANT
DRAPE SHEET LG 3/4 BI-LAMINATE (DRAPES) ×2 IMPLANT
DRAPE SURG 17X11 SM STRL (DRAPES) ×10 IMPLANT
DRAPE TABLE BACK 80X90 (DRAPES) ×2 IMPLANT
DRAPE UNDER BUTTOCK W/FLU (DRAPES) ×2 IMPLANT
DRIVER LRG NEEDLE DA VINCI (INSTRUMENTS) ×2
DRIVER NDLE LRG DVNC (INSTRUMENTS) ×2 IMPLANT
DRSG TELFA 3X8 NADH (GAUZE/BANDAGES/DRESSINGS) ×2 IMPLANT
ELECT REM PT RETURN 9FT ADLT (ELECTROSURGICAL) ×2
ELECTRODE REM PT RTRN 9FT ADLT (ELECTROSURGICAL) ×1 IMPLANT
ENDOPOUCH RETRIEVER 10 (MISCELLANEOUS) ×2 IMPLANT
FILTER LAP SMOKE EVAC STRL (MISCELLANEOUS) IMPLANT
GLOVE BIO SURGEON STRL SZ 6.5 (GLOVE) ×6 IMPLANT
GLOVE INDICATOR 6.5 STRL GRN (GLOVE) ×4 IMPLANT
GOWN STRL REUS W/ TWL LRG LVL3 (GOWN DISPOSABLE) ×6 IMPLANT
GOWN STRL REUS W/TWL LRG LVL3 (GOWN DISPOSABLE) ×6
GRASPER SUT TROCAR 14GX15 (MISCELLANEOUS) ×2 IMPLANT
HEMOSTAT SURGICEL 2X14 (HEMOSTASIS) IMPLANT
HOLDER FOLEY CATH W/STRAP (MISCELLANEOUS) ×2 IMPLANT
IRRIGATION STRYKERFLOW (MISCELLANEOUS) ×1 IMPLANT
IRRIGATOR STRYKERFLOW (MISCELLANEOUS) ×2
IV LACTATED RINGERS 1000ML (IV SOLUTION) ×4 IMPLANT
IV NS 1000ML (IV SOLUTION) ×1
IV NS 1000ML BAXH (IV SOLUTION) ×1 IMPLANT
KIT ACCESSORY DA VINCI DISP (KITS) ×1
KIT ACCESSORY DVNC DISP (KITS) ×1 IMPLANT
KIT PINK PAD W/HEAD ARE REST (MISCELLANEOUS) ×2
KIT PINK PAD W/HEAD ARM REST (MISCELLANEOUS) ×1 IMPLANT
KIT RM TURNOVER CYSTO AR (KITS) ×2 IMPLANT
LABEL OR SOLS (LABEL) IMPLANT
LIQUID BAND (GAUZE/BANDAGES/DRESSINGS) ×2 IMPLANT
LOOP RED MAXI  1X406MM (MISCELLANEOUS)
LOOP VESSEL MAXI 1X406 RED (MISCELLANEOUS) IMPLANT
MARKER SKIN DUAL TIP RULER LAB (MISCELLANEOUS) ×2 IMPLANT
NDL SAFETY 18GX1.5 (NEEDLE) ×2 IMPLANT
NEEDLE HYPO 25X1 1.5 SAFETY (NEEDLE) ×2 IMPLANT
NEEDLE INSUFFLATION 14GA 120MM (NEEDLE) ×2 IMPLANT
NS IRRIG 500ML POUR BTL (IV SOLUTION) ×2 IMPLANT
PACK LAP CHOLECYSTECTOMY (MISCELLANEOUS) ×2 IMPLANT
PENCIL ELECTRO HAND CTR (MISCELLANEOUS) ×2 IMPLANT
PORT ACCESS TROCAR AIRSEAL 12 (TROCAR) ×1 IMPLANT
PORT ACCESS TROCAR AIRSEAL 5M (TROCAR) ×1
PREP PVP WINGED SPONGE (MISCELLANEOUS) IMPLANT
PROGRASP ENDOWRIST DA VINCI (INSTRUMENTS) ×1
PROGRASP ENDOWRIST DVNC (INSTRUMENTS) ×1 IMPLANT
RELOAD GOLD ECHELON 45 (STAPLE) IMPLANT
RELOAD GRAY ECHELON (STAPLE) IMPLANT
RELOAD GREEN ECHELON 45 (STAPLE) IMPLANT
RELOAD WH ECHELON 45 (STAPLE) IMPLANT
SCISSORS METZENBAUM CVD 33 (INSTRUMENTS) IMPLANT
SET SUCTION IRRIG HYDROSURG (IRRIGATION / IRRIGATOR) ×2 IMPLANT
SLEEVE ENDOPATH XCEL 5M (ENDOMECHANICALS) ×4 IMPLANT
SOL PREP PVP 2OZ (MISCELLANEOUS) ×2
SOLUTION ELECTROLUBE (MISCELLANEOUS) ×2 IMPLANT
SOLUTION PREP PVP 2OZ (MISCELLANEOUS) ×1 IMPLANT
SPOGE SURGIFLO 8M (HEMOSTASIS) ×1
SPONGE SURGIFLO 8M (HEMOSTASIS) ×1 IMPLANT
SPONGE VERSALON 4X4 4PLY (MISCELLANEOUS) ×2 IMPLANT
STAPLER SKIN PROX 35W (STAPLE) ×2 IMPLANT
STRAP SAFETY BODY (MISCELLANEOUS) ×6 IMPLANT
SUT DVC VLOC 90 3-0 CV23 UNDY (SUTURE) ×4 IMPLANT
SUT DVC VLOC 90 3-0 CV23 VLT (SUTURE) ×4
SUT ETHILON 3-0 FS-10 30 BLK (SUTURE) ×2
SUT MNCRL 4-0 (SUTURE) ×2
SUT MNCRL 4-0 27XMFL (SUTURE) ×2
SUT PDS AB 1 CT1 36 (SUTURE) IMPLANT
SUT PROLENE 5 0 PS 3 (SUTURE) IMPLANT
SUT SILK 2 0 SH (SUTURE) ×2 IMPLANT
SUT VIC AB 2-0 CT1 (SUTURE) ×8 IMPLANT
SUT VIC AB 2-0 SH 27 (SUTURE) ×5
SUT VIC AB 2-0 SH 27XBRD (SUTURE) ×5 IMPLANT
SUT VIC AB 4-0 RB1 27 (SUTURE) ×1
SUT VIC AB 4-0 RB1 27X BRD (SUTURE) ×1 IMPLANT
SUTURE DVC VLC 90 3-0 CV23 VLT (SUTURE) ×2 IMPLANT
SUTURE EHLN 3-0 FS-10 30 BLK (SUTURE) ×1 IMPLANT
SUTURE MNCRL 4-0 27XMF (SUTURE) ×2 IMPLANT
SYR 3ML LL SCALE MARK (SYRINGE) ×2 IMPLANT
SYR 50ML LL SCALE MARK (SYRINGE) ×2 IMPLANT
SYRINGE 10CC LL (SYRINGE) ×2 IMPLANT
SYRINGE IRR TOOMEY STRL 70CC (SYRINGE) IMPLANT
TAPE CLOTH 10X20 WHT NS LF (TAPE) ×2 IMPLANT
TAPE CLOTH 2X10 WHT NS LF (TAPE) ×2
TOWEL OR 17X26 4PK STRL BLUE (TOWEL DISPOSABLE) IMPLANT
TROCAR DISP BLADELESS 8 DVNC (TROCAR) ×1 IMPLANT
TROCAR DISP BLADELESS 8MM (TROCAR) ×1
TROCAR ENDOPATH XCEL 12X100 BL (ENDOMECHANICALS) ×4 IMPLANT
TROCAR XCEL 12X100 BLDLESS (ENDOMECHANICALS) ×2 IMPLANT
TROCAR XCEL NON-BLD 5MMX100MML (ENDOMECHANICALS) ×2 IMPLANT
TUBING INSUFFLATOR HI FLOW (MISCELLANEOUS) ×2 IMPLANT

## 2016-07-15 NOTE — Transfer of Care (Signed)
Immediate Anesthesia Transfer of Care Note  Patient: Ernest Sweeney  Procedure(s) Performed: Procedure(s): ROBOTIC ASSISTED LAPAROSCOPIC RADICAL PROSTATECTOMY (N/A) PELVIC LYMPH NODE DISSECTION (N/A)  Patient Location: PACU  Anesthesia Type:General  Level of Consciousness: sedated and responds to stimulation  Airway & Oxygen Therapy: Patient Spontanous Breathing and Patient connected to face mask oxygen  Post-op Assessment: Report given to RN and Post -op Vital signs reviewed and stable  Post vital signs: Reviewed and stable  Last Vitals:  Vitals:   07/15/16 0607 07/15/16 1139  BP: (!) 147/112 113/81  Pulse: 98 91  Resp: 18 18  Temp: 36.2 C 36.6 C    Last Pain:  Vitals:   07/15/16 0607  TempSrc: Tympanic         Complications: No apparent anesthesia complications

## 2016-07-15 NOTE — Anesthesia Procedure Notes (Addendum)
Procedure Name: Intubation Performed by: Lance Muss Pre-anesthesia Checklist: Patient identified, Patient being monitored, Timeout performed, Emergency Drugs available and Suction available Patient Re-evaluated:Patient Re-evaluated prior to inductionOxygen Delivery Method: Circle system utilized Preoxygenation: Pre-oxygenation with 100% oxygen Intubation Type: IV induction Ventilation: Mask ventilation without difficulty Laryngoscope Size: Mac and 3 Grade View: Grade III Tube type: Oral Tube size: 7.5 mm Number of attempts: 1 Airway Equipment and Method: Stylet and Bougie stylet Placement Confirmation: ETT inserted through vocal cords under direct vision,  positive ETCO2 and breath sounds checked- equal and bilateral Secured at: 23 cm Tube secured with: Tape Dental Injury: Teeth and Oropharynx as per pre-operative assessment  Difficulty Due To: Difficult Airway- due to anterior larynx Future Recommendations: Recommend- induction with short-acting agent, and alternative techniques readily available Comments: Anterior airway, recommend using Mcgrath or Glidescope for intubation.

## 2016-07-15 NOTE — Anesthesia Postprocedure Evaluation (Signed)
Anesthesia Post Note  Patient: Ernest Sweeney  Procedure(s) Performed: Procedure(s) (LRB): ROBOTIC ASSISTED LAPAROSCOPIC RADICAL PROSTATECTOMY (N/A) PELVIC LYMPH NODE DISSECTION (N/A)  Patient location during evaluation: PACU Anesthesia Type: General Level of consciousness: awake and alert Pain management: pain level controlled Vital Signs Assessment: post-procedure vital signs reviewed and stable Respiratory status: spontaneous breathing, nonlabored ventilation, respiratory function stable and patient connected to nasal cannula oxygen Cardiovascular status: blood pressure returned to baseline and stable Postop Assessment: no signs of nausea or vomiting Anesthetic complications: no     Last Vitals:  Vitals:   07/15/16 1239 07/15/16 1255  BP: 110/86 123/77  Pulse: 97 97  Resp: 12 16  Temp:  36.4 C    Last Pain:  Vitals:   07/15/16 1255  TempSrc: Oral  PainSc:                  Martha Clan

## 2016-07-15 NOTE — Op Note (Signed)
Date of procedure: 07/15/16  Preoperative diagnosis:  1. Clinically localized prostate cancer   Postoperative diagnosis:  1. Clinically localized prostate cancer   Procedure: 1. Robotic-assisted laparoscopic prostatectomy 2. Pelvic lymph node dissection  Surgeon: Baruch Gouty, MD  Assistant: Hollice Espy, MD  Anesthesia: General  Complications: None  Intraoperative findings: The patient had successful removal of his prostate with no leaks seen from the anastomosis. Pelvic lymph node dissection was unremarkable.  EBL: 50 cc  Specimens: Prostate and seminal vesicles, periprotatic fat pad, right and left pelvic lymph nodes  Drains: 18 French Foley catheter  Disposition: Stable to the postanesthesia care unit  Indication for procedure: The patient is a 66 y.o. male with intermediate risk likely localized prostate cancer who presents today for elective robotic prostatectomy and pelvic lymph node dissection.  After reviewing the management options for treatment, the patient elected to proceed with the above surgical procedure(s). We have discussed the potential benefits and risks of the procedure, side effects of the proposed treatment, the likelihood of the patient achieving the goals of the procedure, and any potential problems that might occur during the procedure or recuperation. Informed consent has been obtained.  Description of procedure: The patient was met in the preoperative area. All risks, benefits, and indications of the procedure were described in great detail. The patient consented to the procedure. Preoperative antibiotics were given. The patient was taken to the operative theater. General anesthesia was induced per the anesthesia service. The patient was then placed in the dorsal lithotomy position and prepped and draped in the usual sterile fashion. A preoperative timeout was called.   An 88 French Foley catheter was then placed. A midline incision just above the  umbilicus just 4 mm in size was made in vertical fashion. The patient's abdomen was insufflated using the standard technique with a Veress needle. After insufflation to 15 mmHg, a 12 mm trocar was then placed. The abdomen was inspected with no evidence of adhesions or iatrogenic injury from insufflation. 2 robotic arms were placed on the left side of the abdomen under direct visualization. A robotic arm and 12 mm and 5 mmports were then placed on the right side of the abdomen under direct visualization. The patient was then placed in steep Trendelenburg and the robot was docked in the standard protocol. Arrangements were placed under direct visualization.  At this time, I unscrubbed from the case and began the robotic portion of the case. Dr. Erlene Quan was present at bedside to pass sutures as well as help with assisting laparoscopically at the bedside. Her assistance was necessary to successful completion of this case.  The bladder was then dropped the space of Retzius was entered. The medial umbilical ligaments were taken down. The bladder was mobilized up to the pelvic floor. The superficial venous complex was then ligated and the periprosthetic fat was removed from the anterior aspect of the prostate. This was sent as a specimen. After defatting the prostate as well as endopelvic fascia, fascia was opened bilaterally. Dissection took place posteriorly until perirectal fat was encountered. The DVC was then ligated with a 2-0 Vicryl in a figure-of-eight fashion. At this point, attention was placed on developed and the bladder neck. The anterior bladder neck was incised to the Foley catheter was visible. Foley catheter was then retracted and pulled into the abdomen for retraction of the prostate. At this time, we realized that he had a very significant median lobe. After opening the lateral bladder neck, figure-of-eight suture was placed  in the median lobe and this was held up with the third arm for retraction.  Immediately was excised from the bladder to form a reasonable size bladder neck. The ureteral orifices were not involved confirmed to be unharmed by looking inside the bladder. Posterior dissection then took place. The vas deferens were isolated bilaterally. The seminal vesicles were then dissected free. Denovier's fascia was then opened and the peri-rectal fascia was pushed posteriorly and the prostate was dissected anteriorly off the rectum. The bilateral pedicles of the prostate were taken with sequential clipping. Once the pedicles were ligated, the lateral attachments of the prostate were then dissected off with minimal electrocautery. This was done bilaterally until the only remaining structures were the SVC which was previously ligated but not cut and the urethra. The SVC was incised. The urethra was then milked from the prostate to decrease the urethral length. It was then cut from the prostate with cold cuts. This point, the specimen was free and placed in a Endo Catch bag. Hemostasis at this time was excellent. The bladder neck was then reconstructed to the urethra with a running 6 double-armed 30V lock suture. After this was done a fresh 18 [place] Foley was placed in the bladder with 15 cc in the balloon. Upon irrigation of the bladder with 300 cc there was no leakage of urine. For this reason, no drain was placed at the end of the procedure as there was no evidence of urine leak. At this point, we performed a pelvic lymph node dissection bilaterally. The lymphatic tissue from the pelvic brim to the iliac vein and obturator nerve were 6 size bilaterally. The obturator nerve was unharmed bilaterally during this procedure. Clip was placed distally on both sides to  help prevent lymphocele. Each lymphatic bundle was sent separately to pathology. At this point hemostasis was excellent. FloSeal was placed in the bilateral gutters the prostate as well as in the lymph node dissection bed. The patient's abdomen  was then desufflated. His 12 mm lateral incision was closed with 0 Vicryl. The midline incision was extended approximately 3 cm to allow for the Endo catch bag to be removed. The fascia was reapproximated with figure-of-eight 0 Vicryl suture. Subcuticular closure took place with a 4-0 Monocryl. The skin was placed to the incisions. The patient was then awoken from anesthesia and transferred in stable condition to postanesthesia care unit.  Plan:  The patient will be admitted to the floor for overnight observation. He'll be discharged home hopefully tomorrow morning for Foley catheter which will stay in place for 7-10 days. Hopefull his pathology results will be available at that time.  Baruch Gouty, M.D.

## 2016-07-15 NOTE — Progress Notes (Signed)
Bracey rounding the unit was asked to visit the Pt in [id number]. Pt in the Rm with family bedside. [place] talked with the Pt about the value of family, and the upon the request of the Pt, [place] offered prayers for the Pt and his family.     07/15/16 1600  Clinical Encounter Type  Visited With Patient;Patient and family together  Visit Type Initial;Spiritual support  Referral From Nurse  Consult/Referral To Chaplain  Spiritual Encounters  Spiritual Needs Prayer;Other (Comment)

## 2016-07-15 NOTE — H&P (Signed)
05/28/2016 12:06 PM   Ernest Sweeney 1951-01-09 [id number]  Referring provider: [name], MD [address] [place], [place] [address]      Chief Complaint  Patient presents with  . Prostate Cancer    Follow up    HPI: The patient is a 66 year old gentleman presents today to follow up his prostate cancer diagnosis.   1. Prostate cancer The patient presents to discuss his prostate biopsy results which are detailed below. At baseline, he is able to obtain and maintain erections sufficient to complete intercourse. He does however state though he is not sexually active at this time and erections are not important to him.  PSA at diagnosis: 5.9 Biopsy results: Gleason 3+4 = 7 prostate cancer in 8 of 12 cores. 2 of the cores were 40-60% Gleason 3+4 = 7 prostate cancer in the right lateral mid and right lateral apex. The remainder of the cores were Gleason 3+3 = 6 prostate cancer.  His brother has prostate cancer and underwent RRP 8-9 years ago and has had not recurrance. He also has an uncle with prostate cancer.    He has had no abdominal surgeries before. He said a chance to think about undergoing a prostatectomy since her last visit 2 weeks ago. He continues to be interested in this procedure and has elected to proceed with robotic prostatectomy with pelvic lymph node dissection.   PMH:     Past Medical History:  Diagnosis Date  . Benign prostatic hypertrophy without lower urinary tract symptoms   . Colon polyp   . GERD (gastroesophageal reflux disease)   . Hyperlipidemia   . Hypertension     Surgical History:      Past Surgical History:  Procedure Laterality Date  . COLONOSCOPY  2010   Dr Sula Rumple  . excision skin cyst  06-19-14   Dr. Festus Aloe CYST    Home Medications:        Medication List           Accurate as of 05/28/16 12:06 PM. Always use your most recent med list.           aspirin 81 MG  tablet Take 81 mg by mouth daily.  fluticasone 50 MCG/ACT nasal spray Commonly known as:  FLONASE Place 2 sprays into both nostrils daily.  lovastatin 20 MG tablet Commonly known as:  MEVACOR Take 1 tablet (20 mg total) by mouth at bedtime.  tamsulosin 0.4 MG Caps capsule Commonly known as:  FLOMAX Take 1 capsule (0.4 mg total) by mouth daily.  Vitamin D 2000 units Caps Take 1 capsule (2,000 Units total) by mouth daily.      Allergies: No Known Allergies  Family History: Family History  Problem Relation Age of Onset  . Heart disease Mother   . Diabetes Father   . Cancer Sister     Breast  . Cancer Brother     Prostate    Social History:  reports that he has been smoking.  He has a 10.00 pack-year smoking history. He has never used smokeless tobacco. He reports that he drinks alcohol. He reports that he does not use drugs.  ROS: UROLOGY Frequent Urination?: No Hard to postpone urination?: No Burning/pain with urination?: No Get up at night to urinate?: Yes Leakage of urine?: Yes Urine stream starts and stops?: Yes Trouble starting stream?: No Do you have to strain to urinate?: No Blood in urine?: No Urinary tract infection?: No Sexually transmitted disease?: No Injury to  kidneys or bladder?: No Painful intercourse?: No Weak stream?: No Erection problems?: No Penile pain?: No  Gastrointestinal Nausea?: No Vomiting?: No Indigestion/heartburn?: No Diarrhea?: No Constipation?: No  Constitutional Fever: No Night sweats?: No Weight loss?: No Fatigue?: No  Skin Skin rash/lesions?: No Itching?: No  Eyes Blurred vision?: No Double vision?: No  Ears/Nose/Throat Sore throat?: No Sinus problems?: No  Hematologic/Lymphatic Swollen glands?: No Easy bruising?: No  Cardiovascular Leg swelling?: No Chest pain?: No  Respiratory Cough?: No Shortness of breath?: No  Endocrine Excessive thirst?: No  Musculoskeletal Back  pain?: No Joint pain?: No  Neurological Headaches?: No Dizziness?: No  Psychologic Depression?: No Anxiety?: Yes  Physical Exam: BP 111/78 (BP Location: Left Arm, Patient Position: Sitting, Cuff Size: Large)   Pulse (!) 118   Ht 5\' 9"  (1.753 m)   Wt 180 lb 11.2 oz (82 kg)   BMI 26.68 kg/m   Constitutional:  Alert and oriented, No acute distress. HEENT: [place] AT, moist mucus membranes.  Trachea midline, no masses. Cardiovascular: No clubbing, cyanosis, or edema. RRR. Respiratory: Normal respiratory effort, no increased work of breathing. GI: Abdomen is soft, nontender, nondistended, no abdominal masses GU: No CVA tenderness.  Skin: No rashes, bruises or suspicious lesions. Lymph: No cervical or inguinal adenopathy. Neurologic: Grossly intact, no focal deficits, moving all 4 extremities. Psychiatric: Normal mood and affect.  Laboratory Data: RecentLabs       Lab Results  Component Value Date   WBC 3.6 10/18/2015   HCT 43.1 10/18/2015   MCV 96 10/18/2015   PLT 323 10/18/2015      RecentLabs       Lab Results  Component Value Date   CREATININE 1.24 10/18/2015      RecentLabs       Lab Results  Component Value Date   PSA 5.99 (H) 02/05/2016   PSA 2.6 08/24/2014      RecentLabs  No results found for: TESTOSTERONE    RecentLabs  No results found for: HGBA1C    Urinalysis Labs(Brief)          Component Value Date/Time   APPEARANCEUR Clear 03/17/2016 0834   GLUCOSEU Negative 03/17/2016 0834   BILIRUBINUR Negative 03/17/2016 0834   PROTEINUR 1+ (A) 03/17/2016 0834   NITRITE Negative 03/17/2016 0834   LEUKOCYTESUR Negative 03/17/2016 0834       Assessment & Plan:   1. Prostate cancer Robotic prostatectomy with pelvic lymph node dissection

## 2016-07-15 NOTE — Anesthesia Preprocedure Evaluation (Signed)
Anesthesia Evaluation  Patient identified by MRN, date of birth, ID band Patient awake    Reviewed: Allergy & Precautions, H&P , NPO status , Patient's Chart, lab work & pertinent test results, reviewed documented beta blocker date and time   History of Anesthesia Complications Negative for: history of anesthetic complications  Airway Mallampati: II  TM Distance: >3 FB Neck ROM: full    Dental  (+) Missing, Poor Dentition   Pulmonary neg pulmonary ROS, former smoker,    Pulmonary exam normal breath sounds clear to auscultation       Cardiovascular Exercise Tolerance: Good hypertension, (-) angina(-) CAD, (-) Past MI, (-) Cardiac Stents and (-) CABG Normal cardiovascular exam(-) dysrhythmias (-) Valvular Problems/Murmurs Rhythm:regular Rate:Normal     Neuro/Psych negative neurological ROS  negative psych ROS   GI/Hepatic Neg liver ROS, GERD  ,  Endo/Other  negative endocrine ROS  Renal/GU negative Renal ROS  negative genitourinary   Musculoskeletal   Abdominal   Peds  Hematology negative hematology ROS (+)   Anesthesia Other Findings Past Medical History: No date: Anxiety     Comment: Panic attacks in the past No date: Arthritis No date: Benign prostatic hypertrophy without lower uri* No date: Cancer ([place])     Comment: Prostate No date: Colon polyp No date: GERD (gastroesophageal reflux disease) No date: Glaucoma (increased eye pressure)     Comment: Left eye only No date: Hyperlipidemia No date: Hypertension   Reproductive/Obstetrics negative OB ROS                             Anesthesia Physical Anesthesia Plan  ASA: II  Anesthesia Plan: General   Post-op Pain Management:    Induction:   Airway Management Planned:   Additional Equipment:   Intra-op Plan:   Post-operative Plan:   Informed Consent: I have reviewed the patients History and Physical, chart, labs and  discussed the procedure including the risks, benefits and alternatives for the proposed anesthesia with the patient or authorized representative who has indicated his/her understanding and acceptance.   Dental Advisory Given  Plan Discussed with: Anesthesiologist, CRNA and Surgeon  Anesthesia Plan Comments:         Anesthesia Quick Evaluation

## 2016-07-16 LAB — CBC
HCT: 37.8 % — ABNORMAL LOW (ref 40.0–52.0)
Hemoglobin: 13 g/dL (ref 13.0–18.0)
MCH: 33.5 pg (ref 26.0–34.0)
MCHC: 34.4 g/dL (ref 32.0–36.0)
MCV: 97.3 fL (ref 80.0–100.0)
PLATELETS: 276 10*3/uL (ref 150–440)
RBC: 3.89 MIL/uL — AB (ref 4.40–5.90)
RDW: 13.1 % (ref 11.5–14.5)
WBC: 3.4 10*3/uL — AB (ref 3.8–10.6)

## 2016-07-16 LAB — BASIC METABOLIC PANEL
ANION GAP: 4 — AB (ref 5–15)
BUN: 10 mg/dL (ref 6–20)
CALCIUM: 8.2 mg/dL — AB (ref 8.9–10.3)
CHLORIDE: 105 mmol/L (ref 101–111)
CO2: 26 mmol/L (ref 22–32)
CREATININE: 1.22 mg/dL (ref 0.61–1.24)
GFR calc non Af Amer: >60 mL/min (ref >60)
Glucose, Bld: 101 mg/dL — ABNORMAL HIGH (ref 65–99)
Potassium: 3.7 mmol/L (ref 3.5–5.1)
SODIUM: 135 mmol/L (ref 135–145)

## 2016-07-16 MED ORDER — DOCUSATE SODIUM 100 MG PO CAPS: 100.0000 mg | ORAL_CAPSULE | Freq: Two times a day (BID) | ORAL | 0 refills | Status: DC

## 2016-07-16 MED ORDER — HYDROCODONE-ACETAMINOPHEN 5-325 MG PO TABS: 1.0000 | ORAL_TABLET | ORAL | 0 refills | Status: DC | PRN

## 2016-07-16 NOTE — Discharge Summary (Signed)
Date of admission: 07/15/2016  Date of discharge: 07/16/2016  Admission diagnosis: Clinically localized prostate cancer  Discharge diagnosis: Same  Secondary diagnoses:  Patient Active Problem List   Diagnosis Date Noted  . Prostate cancer ([place]) 07/15/2016  . Vitamin D deficiency 05/31/2015  . Hyperlipidemia LDL goal <100 05/31/2015  . Seasonal allergic rhinitis 05/31/2015  . Chronic pain of both shoulders 05/31/2015  . Family history of malignant neoplasm of prostate 07/26/2012  . Hyperplasia of prostate with lower urinary tract symptoms (LUTS) 07/26/2012  . Incomplete bladder emptying 07/26/2012  . Hematuria, microscopic 07/26/2012    History and Physical: For full details, please see admission history and physical. Briefly, Ernest Sweeney is a 66 y.o. year old patient with clinically localized prostate cancer who underwent a robotic prostatectomy with pelvic lymph node dissection.   Hospital Course: Patient tolerated the procedure well.  He was then transferred to the floor after an uneventful PACU stay.  His hospital course was uncomplicated.  On POD#1 he had met discharge criteria: was eating a regular diet, was up and ambulating independently,  pain was well controlled, and was ready to for discharge.   Laboratory values:   Recent Labs  07/16/16 0522  WBC 3.4*  HGB 13.0  HCT 37.8*    Recent Labs  07/16/16 0522  NA 135  K 3.7  CL 105  CO2 26  GLUCOSE 101*  BUN 10  CREATININE 1.22  CALCIUM 8.2*   No results for input(s): LABPT, INR in the last 72 hours. No results for input(s): LABURIN in the last 72 hours. Results for orders placed or performed during the hospital encounter of 07/03/16  Urine culture     Status: None   Collection Time: 07/03/16  9:49 AM  Result Value Ref Range Status   Specimen Description URINE, CLEAN CATCH  Final   Special Requests NONE  Final   Culture NO GROWTH Performed at [facility]   Final   Report Status 07/04/2016 FINAL   Final    Disposition: Home  Discharge instruction: The patient was instructed to be ambulatory but told to refrain from heavy lifting, strenuous activity, or driving. No strenuous activity for 6 weeks.   Discharge medications: Allergies as of 07/16/2016   No Known Allergies     Medication List    STOP taking these medications   tamsulosin 0.4 MG Caps capsule Commonly known as:  FLOMAX     TAKE these medications   aspirin 81 MG tablet Take 81 mg by mouth daily.   docusate sodium 100 MG capsule Commonly known as:  COLACE Take 1 capsule (100 mg total) by mouth 2 (two) times daily.   fluticasone 50 MCG/ACT nasal spray Commonly known as:  FLONASE Place 2 sprays into both nostrils daily. What changed:  when to take this  reasons to take this   HYDROcodone-acetaminophen 5-325 MG tablet Commonly known as:  NORCO/VICODIN Take 1-2 tablets by mouth every 4 (four) hours as needed for moderate pain.   latanoprost 0.005 % ophthalmic solution Commonly known as:  XALATAN Place 1 drop into the left eye at bedtime.   lovastatin 20 MG tablet Commonly known as:  MEVACOR Take 1 tablet (20 mg total) by mouth at bedtime.   Vitamin D 2000 units Caps Take 1 capsule (2,000 Units total) by mouth daily.       Followup:  Follow-up [place], MD Follow up in 1 week(s).   Specialty:  Urology Contact  information: [place] [address] [phone]

## 2016-07-16 NOTE — Progress Notes (Addendum)
IV was removed. Discharge instructions, follow-up appointments, and prescriptions were provided to the pt. The pt was is currently waiting for transport home. Education was given on how to change the leg bag to overnight bag. Pt voiced understanding and had no questions. Pt was given an overnight bag and alcohol swabs for cleaning of bag.

## 2016-07-17 ENCOUNTER — Telehealth: Payer: Self-pay | Admitting: Urology

## 2016-07-17 LAB — SURGICAL PATHOLOGY

## 2016-07-17 NOTE — Telephone Encounter (Signed)
done

## 2016-07-20 ENCOUNTER — Telehealth: Payer: Self-pay

## 2016-07-20 NOTE — Telephone Encounter (Signed)
Pt called c/o swelling at the end of his penis that just started in the last couple of days. Per Larene [place] pt should come in to be seen. Attempted to call pt back without success.

## 2016-07-23 ENCOUNTER — Ambulatory Visit (INDEPENDENT_AMBULATORY_CARE_PROVIDER_SITE_OTHER): Payer: PPO | Admitting: Urology

## 2016-07-23 ENCOUNTER — Encounter: Payer: Self-pay | Admitting: Urology

## 2016-07-23 VITALS — BP 112/75 | HR 118 | Ht 69.0 in | Wt 177.0 lb

## 2016-07-23 DIAGNOSIS — C61 Malignant neoplasm of prostate: Secondary | ICD-10-CM

## 2016-07-23 NOTE — Progress Notes (Signed)
07/23/2016 9:55 AM   Ernest Sweeney 11-20-1950 [id number]  Referring provider: [name], MD [address] [place], [place] [address]  Chief Complaint  Patient presents with  . Prostate Cancer    1 wk post op    HPI: The patient is a 66 year old gentleman presents today after undergoing a robotic prostatectomy with pelvic lymph node dissection 1 week ago. He has done well postoperatively. He has no nausea or vomiting. He is back to his regular appetite. He is passing gas good bowel movements. Distal take an occasional narcotic pain medication that this pain is greatly improving. His complaints at this time.  Pathology: pT3aN0Mx Gleason 3+4 prostae cancer  Focal extraprostatic extension +perineural invasion Margins negative Blt pelvic LN negative Pretreatment PSA: 5.99   PMH: Past Medical History:  Diagnosis Date  . Anxiety    Panic attacks in the past  . Arthritis   . Benign prostatic hypertrophy without lower urinary tract symptoms   . Cancer [facility])    Prostate  . Colon polyp   . GERD (gastroesophageal reflux disease)   . Glaucoma (increased eye pressure)    Left eye only  . Hyperlipidemia   . Hypertension     Surgical History: Past Surgical History:  Procedure Laterality Date  . COLONOSCOPY  2010   Dr Sula Rumple  . excision skin cyst  06-19-14   Dr. Festus Aloe CYST  . PELVIC LYMPH NODE DISSECTION N/A 07/15/2016   Procedure: PELVIC LYMPH NODE DISSECTION;  Surgeon: Nickie Retort, MD;  Location: [facility] ORS;  Service: Urology;  Laterality: N/A;  . ROBOT ASSISTED LAPAROSCOPIC RADICAL PROSTATECTOMY N/A 07/15/2016   Procedure: ROBOTIC ASSISTED LAPAROSCOPIC RADICAL PROSTATECTOMY;  Surgeon: Nickie Retort, MD;  Location: [facility] ORS;  Service: Urology;  Laterality: N/A;    Home Medications:  Allergies as of 07/23/2016   No Known Allergies     Medication List       Accurate as of 07/23/16  9:55 AM. Always use your most recent med list.          aspirin 81 MG tablet Take 81 mg by mouth daily.   docusate sodium 100 MG capsule Commonly known as:  COLACE Take 1 capsule (100 mg total) by mouth 2 (two) times daily.   fluticasone 50 MCG/ACT nasal spray Commonly known as:  FLONASE Place 2 sprays into both nostrils daily.   HYDROcodone-acetaminophen 5-325 MG tablet Commonly known as:  NORCO/VICODIN Take 1-2 tablets by mouth every 4 (four) hours as needed for moderate pain.   latanoprost 0.005 % ophthalmic solution Commonly known as:  XALATAN Place 1 drop into the left eye at bedtime.   lovastatin 20 MG tablet Commonly known as:  MEVACOR Take 1 tablet (20 mg total) by mouth at bedtime.   Vitamin D 2000 units Caps Take 1 capsule (2,000 Units total) by mouth daily.       Allergies: No Known Allergies  Family History: Family History  Problem Relation Age of Onset  . Heart disease Mother   . Diabetes Father   . Cancer Sister     Breast  . Cancer Brother     Prostate    Social History:  reports that he quit smoking about 4 months ago. His smoking use included Cigarettes. He has a 10.00 pack-year smoking history. He has never used smokeless tobacco. He reports that he drinks alcohol. He reports that he does not use drugs.  ROS: UROLOGY Frequent Urination?: No Hard to postpone urination?: No Burning/pain  with urination?: No Get up at night to urinate?: No Leakage of urine?: Yes Urine stream starts and stops?: No Trouble starting stream?: No Do you have to strain to urinate?: No Blood in urine?: Yes Urinary tract infection?: No Sexually transmitted disease?: No Injury to kidneys or bladder?: No Painful intercourse?: No Weak stream?: No Erection problems?: No Penile pain?: No  Gastrointestinal Nausea?: No Vomiting?: No Indigestion/heartburn?: No Diarrhea?: No Constipation?: Yes  Constitutional Fever: No Night sweats?: No Weight loss?: No Fatigue?: No  Skin Skin rash/lesions?: No Itching?:  No  Eyes Blurred vision?: No Double vision?: No  Ears/Nose/Throat Sore throat?: No Sinus problems?: No  Hematologic/Lymphatic Swollen glands?: No Easy bruising?: No  Cardiovascular Leg swelling?: No Chest pain?: No  Respiratory Cough?: No Shortness of breath?: No  Endocrine Excessive thirst?: No  Musculoskeletal Back pain?: No Joint pain?: No  Neurological Headaches?: No Dizziness?: No  Psychologic Depression?: No Anxiety?: No  Physical Exam: BP 112/75   Pulse (!) 118   Ht 5\' 9"  (1.753 m)   Wt 177 lb (80.3 kg)   BMI 26.14 kg/m   Constitutional:  Alert and oriented, No acute distress. HEENT: [place] AT, moist mucus membranes.  Trachea midline, no masses. Cardiovascular: No clubbing, cyanosis, or edema. Respiratory: Normal respiratory effort, no increased work of breathing. GI: Abdomen is soft, nontender, nondistended, no abdominal masses GU: No CVA tenderness. Incisions clean dry and intact. Skin: No rashes, bruises or suspicious lesions. Lymph: No cervical or inguinal adenopathy. Neurologic: Grossly intact, no focal deficits, moving all 4 extremities. Psychiatric: Normal mood and affect.  Laboratory Data: Lab Results  Component Value Date   WBC 3.4 (L) 07/16/2016   HGB 13.0 07/16/2016   HCT 37.8 (L) 07/16/2016   MCV 97.3 07/16/2016   PLT 276 07/16/2016    Lab Results  Component Value Date   CREATININE 1.22 07/16/2016    Lab Results  Component Value Date   PSA 5.99 (H) 02/05/2016   PSA 2.6 08/24/2014    No results found for: TESTOSTERONE  No results found for: HGBA1C  Urinalysis    Component Value Date/Time   APPEARANCEUR Hazy (A) 05/28/2016 1200   GLUCOSEU Negative 05/28/2016 1200   BILIRUBINUR Negative 05/28/2016 1200   PROTEINUR Negative 05/28/2016 1200   NITRITE Negative 05/28/2016 1200   LEUKOCYTESUR Trace (A) 05/28/2016 1200     Assessment & Plan:    1. pT3aN0Mx prostate cancer -will check first post-treatment PSA in 3  months -will assess need for post op pelvic floor PT at next visit. Encouraged to continue the pelvic floor exercises that he was taught and preoperative pelvic floor PT. -He currently is not interested in obtaining an erection at this time. We will address this in the future if his life situation changes.   Return in about 3 months (around 10/21/2016) for PSA prior.  Nickie Retort, MD  [facility] [address], [place], [place] [address] [phone]

## 2016-07-23 NOTE — Telephone Encounter (Signed)
Attempted to call pt again about penis swelling. Pt did not answer.

## 2016-07-23 NOTE — Progress Notes (Signed)
Catheter Removal  Patient is present today for a catheter removal.  41ml of water was drained from the balloon. A 18FR foley cath was removed from the bladder no complications were noted . Patient tolerated well.  Preformed by: Fonnie Jarvis, CMA

## 2016-08-04 ENCOUNTER — Ambulatory Visit (INDEPENDENT_AMBULATORY_CARE_PROVIDER_SITE_OTHER): Payer: PPO | Admitting: Family Medicine

## 2016-08-04 ENCOUNTER — Encounter: Payer: Self-pay | Admitting: Family Medicine

## 2016-08-04 VITALS — BP 118/78 | HR 115 | Temp 98.0°F | Resp 16 | Ht 72.0 in | Wt 178.3 lb

## 2016-08-04 DIAGNOSIS — C61 Malignant neoplasm of prostate: Secondary | ICD-10-CM | POA: Diagnosis not present

## 2016-08-04 DIAGNOSIS — Z9079 Acquired absence of other genital organ(s): Secondary | ICD-10-CM

## 2016-08-04 DIAGNOSIS — E785 Hyperlipidemia, unspecified: Secondary | ICD-10-CM

## 2016-08-04 DIAGNOSIS — I10 Essential (primary) hypertension: Secondary | ICD-10-CM | POA: Diagnosis not present

## 2016-08-04 DIAGNOSIS — H409 Unspecified glaucoma: Secondary | ICD-10-CM | POA: Diagnosis not present

## 2016-08-04 MED ORDER — LOVASTATIN 20 MG PO TABS: 20.0000 mg | ORAL_TABLET | Freq: Every day | ORAL | 1 refills | Status: DC

## 2016-08-04 NOTE — Progress Notes (Signed)
Name: Ernest Sweeney   MRN: [id number]    DOB: 22-Oct-1950   Date:08/04/2016       Progress Note  Subjective  Chief Complaint  Chief Complaint  Patient presents with  . Hypertension    pt stated that it has been high during visits to other doctors and wanted to be placed back on medication  . Hyperlipidemia    HPI  HTN: he has been off lisinopril for the 10 months ( since March 2017 ) and bp has been at goal, no longer getting dizzy.  Denies chest pain or palpitation, last EKG was normal at [facility]. He has quit smoking  Hyperlipidemia: taking Lovastatin, no side effect: Denies myalgias or chest pain  Seasonal Allergic Rhinitis: currently not having symptoms, no sneezing or nasal congestion, but has symptoms during the Spring.  Prostate Cancer : recent prostatectomy done by Dr. Pilar Jarvis on 07/15/2016. Gleason 3+4. Advised to keep regular follow up with Urologist. Having ED but not urinary retention symptoms.   Patient Active Problem List   Diagnosis Date Noted  . Glaucoma, left eye 08/04/2016  . Prostate cancer ([place]) 07/15/2016  . Vitamin D deficiency 05/31/2015  . Hyperlipidemia LDL goal <100 05/31/2015  . Seasonal allergic rhinitis 05/31/2015  . Chronic pain of both shoulders 05/31/2015  . Family history of malignant neoplasm of prostate 07/26/2012  . Hyperplasia of prostate with lower urinary tract symptoms (LUTS) 07/26/2012  . Incomplete bladder emptying 07/26/2012  . Hematuria, microscopic 07/26/2012    Past Surgical History:  Procedure Laterality Date  . COLONOSCOPY  2010   Dr Sula Rumple  . excision skin cyst  06-19-14   Dr. Festus Aloe CYST  . PELVIC LYMPH NODE DISSECTION N/A 07/15/2016   Procedure: PELVIC LYMPH NODE DISSECTION;  Surgeon: Nickie Retort, MD;  Location: [facility] ORS;  Service: Urology;  Laterality: N/A;  . ROBOT ASSISTED LAPAROSCOPIC RADICAL PROSTATECTOMY N/A 07/15/2016   Procedure: ROBOTIC ASSISTED LAPAROSCOPIC RADICAL PROSTATECTOMY;  Surgeon: Nickie Retort, MD;  Location: [facility] ORS;  Service: Urology;  Laterality: N/A;    Family History  Problem Relation Age of Onset  . Heart disease Mother   . Diabetes Father   . Cancer Sister     Breast  . Cancer Brother     Prostate    Social History   Social History  . Marital status: Married    Spouse name: N/A  . Number of children: N/A  . Years of education: N/A   Occupational History  . Not on file.   Social History Main Topics  . Smoking status: Former Smoker    Packs/day: 0.50    Years: 20.00    Types: Cigarettes    Quit date: 03/23/2016  . Smokeless tobacco: Never Used  . Alcohol use 0.0 oz/week     Comment: social  . Drug use: No  . Sexual activity: Not on file   Other Topics Concern  . Not on file   Social History Narrative  . No narrative on file     Current Outpatient Prescriptions:  .  aspirin 81 MG tablet, Take 81 mg by mouth daily., Disp: , Rfl:  .  Cholecalciferol (VITAMIN D) 2000 units CAPS, Take 1 capsule (2,000 Units total) by mouth daily., Disp: 30 capsule, Rfl: 0 .  fluticasone (FLONASE) 50 MCG/ACT nasal spray, Place 2 sprays into both nostrils daily. (Patient taking differently: Place 2 sprays into both nostrils daily as needed for allergies or rhinitis. ), Disp: 16 g, Rfl: 6 .  latanoprost (XALATAN) 0.005 % ophthalmic solution, Place 1 drop into the left eye at bedtime., Disp: , Rfl:  .  lovastatin (MEVACOR) 20 MG tablet, Take 1 tablet (20 mg total) by mouth at bedtime., Disp: 90 tablet, Rfl: 1  No Known Allergies   ROS  Constitutional: Negative for fever or weight change.  Respiratory: Negative for cough and shortness of breath.   Cardiovascular: Negative for chest pain or palpitations.  Gastrointestinal: Negative for abdominal pain, no bowel changes.  Musculoskeletal: Negative for gait problem or joint swelling.  Skin: Negative for rash.  Neurological: Negative for dizziness or headache.  No other specific complaints in a complete  review of systems (except as listed in HPI above).  Objective  Vitals:   08/04/16 0941  BP: 118/78  Pulse: (!) 115  Resp: 16  Temp: 98 F (36.7 C)  SpO2: 97%  Weight: 178 lb 5 oz (80.9 kg)  Height: 6' (1.829 m)    Body mass index is 24.18 kg/m.  Physical Exam  Constitutional: Patient appears well-developed and well-nourished. No distress.  HEENT: head atraumatic, normocephalic, pupils equal and reactive to light,  neck supple, throat within normal limits Cardiovascular: Normal rate, regular rhythm and normal heart sounds.  No murmur heard. No BLE edema. Pulmonary/Chest: Effort normal and breath sounds normal. No respiratory distress. Abdominal: Soft.  There is no tenderness. Psychiatric: Patient has a normal mood and affect. behavior is normal. Judgment and thought content normal.  Recent Results (from the past 2160 hour(s))  Urinalysis, Complete     Status: Abnormal   Collection Time: 05/28/16 12:00 PM  Result Value Ref Range   Specific Gravity, UA 1.020 1.005 - 1.030   pH, UA 6.0 5.0 - 7.5   Color, UA Yellow Yellow   Appearance Ur Hazy (A) Clear   Leukocytes, UA Trace (A) Negative   Protein, UA Negative Negative/Trace   Glucose, UA Negative Negative   Ketones, UA Negative Negative   RBC, UA 2+ (A) Negative   Bilirubin, UA Negative Negative   Urobilinogen, Ur 0.2 0.2 - 1.0 mg/dL   Nitrite, UA Negative Negative   Microscopic Examination See below:   Microscopic Examination     Status: Abnormal   Collection Time: 05/28/16 12:00 PM  Result Value Ref Range   WBC, UA 0-5 0 - 5 /hpf   RBC, UA 3-10 (A) 0 - 2 /hpf   Epithelial Cells (non renal) 0-10 0 - 10 /hpf   Bacteria, UA None seen None seen/Few  APTT     Status: Abnormal   Collection Time: 07/03/16  9:49 AM  Result Value Ref Range   aPTT 43 (H) 24 - 36 seconds    Comment:        IF BASELINE aPTT IS ELEVATED, SUGGEST PATIENT RISK ASSESSMENT BE USED TO DETERMINE APPROPRIATE ANTICOAGULANT THERAPY.   Basic  metabolic panel     Status: None   Collection Time: 07/03/16  9:49 AM  Result Value Ref Range   Sodium 136 135 - 145 mmol/L   Potassium 4.2 3.5 - 5.1 mmol/L   Chloride 106 101 - 111 mmol/L   CO2 24 22 - 32 mmol/L   Glucose, Bld 81 65 - 99 mg/dL   BUN 13 6 - 20 mg/dL   Creatinine, Ser 1.02 0.61 - 1.24 mg/dL   Calcium 9.6 8.9 - 10.3 mg/dL   GFR calc non Af Amer >60 >60 mL/min   GFR calc Af Amer >60 >60 mL/min    Comment: (NOTE)  The eGFR has been calculated using the CKD EPI equation. This calculation has not been validated in all clinical situations. eGFR's persistently <60 mL/min signify possible Chronic Kidney Disease.    Anion gap 6 5 - 15  CBC     Status: Abnormal   Collection Time: 07/03/16  9:49 AM  Result Value Ref Range   WBC 3.6 (L) 3.8 - 10.6 K/uL   RBC 4.69 4.40 - 5.90 MIL/uL   Hemoglobin 15.4 13.0 - 18.0 g/dL   HCT 45.1 40.0 - 52.0 %   MCV 96.1 80.0 - 100.0 fL   MCH 32.8 26.0 - 34.0 pg   MCHC 34.1 32.0 - 36.0 g/dL   RDW 13.6 11.5 - 14.5 %   Platelets 324 150 - 440 K/uL  Protime-INR     Status: None   Collection Time: 07/03/16  9:49 AM  Result Value Ref Range   Prothrombin Time 12.8 11.4 - 15.2 seconds   INR 0.96   Urine culture     Status: None   Collection Time: 07/03/16  9:49 AM  Result Value Ref Range   Specimen Description URINE, CLEAN CATCH    Special Requests NONE    Culture NO GROWTH Performed at [facility]     Report Status 07/04/2016 FINAL   Type and screen [place]     Status: None   Collection Time: 07/03/16 10:00 AM  Result Value Ref Range   ABO/RH(D) A POS    Antibody Screen NEG    Sample Expiration 07/17/2016    Extend sample reason NO TRANSFUSIONS OR PREGNANCY IN THE PAST 3 MONTHS   ABO/Rh     Status: None   Collection Time: 07/15/16  6:30 AM  Result Value Ref Range   ABO/RH(D) A POS   APTT     Status: Abnormal   Collection Time: 07/15/16  6:30 AM  Result Value Ref Range   aPTT 39 (H) 24 - 36 seconds     Comment:        IF BASELINE aPTT IS ELEVATED, SUGGEST PATIENT RISK ASSESSMENT BE USED TO DETERMINE APPROPRIATE ANTICOAGULANT THERAPY.   Surgical pathology     Status: None   Collection Time: 07/15/16  9:13 AM  Result Value Ref Range   SURGICAL PATHOLOGY      Surgical Pathology CASE: [id number] PATIENT: Sharlotte Alamo Surgical Pathology Report     SPECIMEN SUBMITTED: A. Peri prostatic fat pad B. Prostate C. Lymph node, right pelvic D. Lymph node, left pelvic  CLINICAL HISTORY: None provided  PRE-OPERATIVE DIAGNOSIS: Prostate cancer  POST-OPERATIVE DIAGNOSIS: Same as pre-op     DIAGNOSIS: A. PERIPROSTHETIC FAT PAD; EXCISION: - NO TUMOR SEEN.  B. PROSTATE; ROBOTIC-ASSISTED RADICAL PROSTATECTOMY: - ADENOCARCINOMA, ACINAR TYPE. - GLEASON SCORE 7 (3+4), GRADE GROUP 2. - FOCAL EXTRAPROSTATIC EXTENSION. - PERINEURAL INVASION IDENTIFIED. - SEE SUMMARY BELOW.  C. LYMPH NODE, RIGHT PELVIC; DISSECTION: - NO TUMOR SEEN IN ONE LYMPH NODE (0/1).  D. LYMPH NODE, LEFT PELVIC; DISSECTION: - NO TUMOR SEEN IN THREE LYMPH NODES (0/3).   Surgical Pathology Cancer Case Summary  PROSTATE GLAND: Procedure:  Radical prostatectomy Histologic Type:   Acinar  adenocarcinoma Histologic Grade: Gleason Pattern:      Pri mary Gleason Pattern: 3      Secondary Gleason Pattern: 4      Total Gleason Score: 7 Grade Group: 2 Tumor Quantitation: 30% Extraprostatic Extension (EPE): Present, focal Urinary Bladder Neck Invasion: Not identified Seminal Vesicle Invasion: Not identified Margins: Negative  for invasive carcinoma Treatment Effect:  No known presurgical therapy Lymphovascular Invasion: Not identified Perineural Invasion: Present Regional Lymph Nodes: Number of lymph nodes positive for metastasis: 0 Number of lymph nodes examined: 4 Pathologic Stage Classification (pTNM, AJCC 8th Edition): TNM Descriptors: N/A pT3a pN0   GROSS DESCRIPTION:  A. Labeled:  peri-prostatic fat pad  Tissue fragment(s): 2  Size: aggregate, 3.2 x 2.5 x 0.3 cm  Description: pink to yellow lobulated fibrofatty tissue  Entirely submitted in 1-2 cassette(s).   B. Labeled: prostate  Type of procedure: radical prostatectomy  Weight of specimen: 44 grams  Size of specimen: 3.8 (medial-lateral) by  3.8 (superior-inferior) by 4.8 (anterior-posterior) cm  Orientation / inking:  right=blue, left=black, apex=yellow, base/bladder neck=orange  Seminal vesicles: right-3.5 x 1.5 x 0.8 cm, left-4.3 x 1.5 x 1.1 cm  Vasa deferentia: right-4.0 x 0.6 x 0.6 cm, left-4.3 x 0.5 x 0.5 cm  Size(s) of mass(es): a 2.0 x 1.8 x 1.6 cm nodule protruding from the left bladder neck margin  Description of mass(es): no additional definable masses  Description of remainder tissue: spongy tan  Block summary:  1-right apex radially section 2-right bladder neck radially section 3-10-representative right prostate from apex towards base respectively (4-5, 6-7, 8-9-is 1 full-thickness section divided) 11-representative right seminal vesicle and vas deferens at entry to prostate 12-left apex radially section 13-left bladder neck radially section 14-22-representative left prostate from apex towards base respectively (15-16, 17-18, 19-20,21-22-is 1 full-thickness section divided) 23-rep resentative left seminal vesicle and vas deferens at entry prostate   C. Labeled: right pelvic lymph node  Tissue fragment(s): 1  Size: 4.5 x 2.3 x 0.8 cm  Description: yellow lobulated focally firm fragment of tissue, sectioned  Entirely submitted in 1-4 (soft end) 5 (firm end) cassette(s).   D. Labeled: left pelvic lymph node  Tissue fragment(s): 3  Size: aggregate, 5.2 x 3.5 x 1.1 cm  Description: yellow lobulated fibrofatty tissue, largest lymph node 3.8 x 1.2 x 0.6 cm  Block summary 1-bisected lymph node 2-4-largest lymph node 5-one lymph node    Final Diagnosis  performed by Delorse Lek, MD.  Electronically signed 07/17/2016 3:59:23PM    The electronic signature indicates that the named Attending Pathologist has evaluated the specimen  Technical component performed at [place], [address], [place], [place] [address] Lab: [phone] Dir: Darrick Penna. Evette Doffing, MD  Professional component performed at [facility], [facility],  [place], [place], [place] [address] Lab: [phone] Dir: Dellia Nims. Rubinas, MD    Basic metabolic panel     Status: Abnormal   Collection Time: 07/16/16  5:22 AM  Result Value Ref Range   Sodium 135 135 - 145 mmol/L   Potassium 3.7 3.5 - 5.1 mmol/L   Chloride 105 101 - 111 mmol/L   CO2 26 22 - 32 mmol/L   Glucose, Bld 101 (H) 65 - 99 mg/dL   BUN 10 6 - 20 mg/dL   Creatinine, Ser 1.22 0.61 - 1.24 mg/dL   Calcium 8.2 (L) 8.9 - 10.3 mg/dL   GFR calc non Af Amer >60 >60 mL/min   GFR calc Af Amer >60 >60 mL/min    Comment: (NOTE) The eGFR has been calculated using the CKD EPI equation. This calculation has not been validated in all clinical situations. eGFR's persistently <60 mL/min signify possible Chronic Kidney Disease.    Anion gap 4 (L) 5 - 15  CBC     Status: Abnormal   Collection Time: 07/16/16  5:22 AM  Result Value Ref Range   WBC 3.4 (L) 3.8 - 10.6 K/uL   RBC 3.89 (L) 4.40 - 5.90 MIL/uL   Hemoglobin 13.0 13.0 - 18.0 g/dL   HCT 37.8 (L) 40.0 - 52.0 %   MCV 97.3 80.0 - 100.0 fL   MCH 33.5 26.0 - 34.0 pg   MCHC 34.4 32.0 - 36.0 g/dL   RDW 13.1 11.5 - 14.5 %   Platelets 276 150 - 440 K/uL     PHQ2/9: Depression screen [facility] 2/9 08/04/2016 02/05/2016 10/03/2015  Decreased Interest 1 0 1  Down, Depressed, Hopeless 2 0 1  PHQ - 2 Score 3 0 2  Altered sleeping 0 - 1  Tired, decreased energy 0 - 1  Change in appetite 0 - 1  Feeling bad or failure about yourself  0 - 1  Trouble concentrating 0 - 1  Moving slowly or fidgety/restless 0 - 0  Suicidal thoughts 0 - 0  PHQ-9 Score 3 - 7   Difficult doing work/chores Somewhat difficult - Not difficult at all     Fall Risk: Fall Risk  08/04/2016 02/05/2016 10/03/2015  Falls in the past year? No No No     Assessment & Plan  1. Hyperlipidemia LDL goal <100  - lovastatin (MEVACOR) 20 MG tablet; Take 1 tablet (20 mg total) by mouth at bedtime.  Dispense: 90 tablet; Refill: 1  2. Essential hypertension   he used to take bp medication, however bp is normal without any medicines at this time, it spiked during pre-op but he states he was very nervous, normal during Urologist visit recently. At goal today. We will continue to monitor   3. Prostate cancer [facility])  S/p prostatectomy done 07/15/2016, having ED still seeing Urologist   4. Glaucoma of left eye, unspecified glaucoma type  We will recheck [facility] records from [facility]  5. History of prostatectomy  No pain at this time, no symptoms of urinary retention

## 2016-10-19 ENCOUNTER — Other Ambulatory Visit: Payer: PPO

## 2016-10-19 DIAGNOSIS — C61 Malignant neoplasm of prostate: Secondary | ICD-10-CM | POA: Diagnosis not present

## 2016-10-20 LAB — PSA

## 2016-10-22 ENCOUNTER — Ambulatory Visit: Payer: PPO

## 2016-10-29 ENCOUNTER — Ambulatory Visit (INDEPENDENT_AMBULATORY_CARE_PROVIDER_SITE_OTHER): Payer: PPO | Admitting: Urology

## 2016-10-29 ENCOUNTER — Encounter: Payer: Self-pay | Admitting: Urology

## 2016-10-29 VITALS — BP 137/101 | HR 101 | Ht 69.0 in | Wt 180.9 lb

## 2016-10-29 DIAGNOSIS — C61 Malignant neoplasm of prostate: Secondary | ICD-10-CM | POA: Diagnosis not present

## 2016-10-29 MED ORDER — SILDENAFIL CITRATE 20 MG PO TABS: ORAL_TABLET | ORAL | 3 refills | Status: DC

## 2016-10-29 NOTE — Progress Notes (Signed)
10/29/2016 3:14 PM   Ernest Sweeney [birth year] [id number]  Referring provider: [name], MD [address] [place], [place] [address]  Chief Complaint  Patient presents with  . Follow-up    prostate cancer     HPI: The patient is a 66 year old gentleman presents today after undergoing a robotic prostatectomy with pelvic lymph node dissection on July 15, 2016. PSA in April 2018 is undetectable.   At this time, he is completely continent. He does not wear a pad during the day. Very rarely he will leak when he coughs or sneezes. He does wear protective pad at night that is mostly dry in the morning.  In regards to erections, the patient has not had one. He feels the sensation of starting to get one but is unable to obtain one. He did have them preoperatively. He is interested in trying Viagra.  Pathology: pT3aN0Mx Gleason 3+4 prostae cancer  Focal extraprostatic extension +perineural invasion Margins negative Blt pelvic LN negative Pretreatment PSA: 5.99     PMH: Past Medical History:  Diagnosis Date  . Anxiety    Panic attacks in the past  . Arthritis   . Benign prostatic hypertrophy without lower urinary tract symptoms   . Cancer [facility])    Prostate  . Colon polyp   . GERD (gastroesophageal reflux disease)   . Glaucoma (increased eye pressure)    Left eye only  . Hyperlipidemia   . Hypertension     Surgical History: Past Surgical History:  Procedure Laterality Date  . COLONOSCOPY  2010   Dr Sula Rumple  . excision skin cyst  06-19-14   Dr. Festus Aloe CYST  . PELVIC LYMPH NODE DISSECTION N/A 07/15/2016   Procedure: PELVIC LYMPH NODE DISSECTION;  Surgeon: Nickie Retort, MD;  Location: [facility] ORS;  Service: Urology;  Laterality: N/A;  . ROBOT ASSISTED LAPAROSCOPIC RADICAL PROSTATECTOMY N/A 07/15/2016   Procedure: ROBOTIC ASSISTED LAPAROSCOPIC RADICAL PROSTATECTOMY;  Surgeon: Nickie Retort, MD;  Location: [facility] ORS;  Service: Urology;   Laterality: N/A;    Home Medications:  Allergies as of 10/29/2016   No Known Allergies     Medication List       Accurate as of 10/29/16  3:14 PM. Always use your most recent med list.          aspirin 81 MG tablet Take 81 mg by mouth daily.   fluticasone 50 MCG/ACT nasal spray Commonly known as:  FLONASE Place 2 sprays into both nostrils daily.   latanoprost 0.005 % ophthalmic solution Commonly known as:  XALATAN Place 1 drop into the left eye at bedtime.   lovastatin 20 MG tablet Commonly known as:  MEVACOR Take 1 tablet (20 mg total) by mouth at bedtime.   sildenafil 20 MG tablet Commonly known as:  REVATIO Take 1 to 5 tablets PO daily prn   Vitamin D 2000 units Caps Take 1 capsule (2,000 Units total) by mouth daily.       Allergies: No Known Allergies  Family History: Family History  Problem Relation Age of Onset  . Heart disease Mother   . Diabetes Father   . Cancer Sister     Breast  . Cancer Brother     Prostate    Social History:  reports that he quit smoking about 7 months ago. His smoking use included Cigarettes. He has a 10.00 pack-year smoking history. He has never used smokeless tobacco. He reports that he drinks alcohol. He reports that he does not  use drugs.  ROS: UROLOGY Frequent Urination?: No Hard to postpone urination?: No Burning/pain with urination?: No Get up at night to urinate?: No Leakage of urine?: Yes Urine stream starts and stops?: No Trouble starting stream?: No Do you have to strain to urinate?: No Blood in urine?: No Urinary tract infection?: No Sexually transmitted disease?: No Injury to kidneys or bladder?: No Painful intercourse?: No Weak stream?: No Erection problems?: No Penile pain?: No  Gastrointestinal Nausea?: No Vomiting?: No Indigestion/heartburn?: No Diarrhea?: No Constipation?: No  Constitutional Night sweats?: No Weight loss?: No Fatigue?: No  Skin Skin rash/lesions?: No Itching?:  No  Eyes Blurred vision?: No Double vision?: No  Ears/Nose/Throat Sore throat?: No Sinus problems?: No  Hematologic/Lymphatic Swollen glands?: No Easy bruising?: No  Cardiovascular Leg swelling?: No Chest pain?: No  Respiratory Cough?: No Shortness of breath?: No  Endocrine Excessive thirst?: No  Musculoskeletal Back pain?: No Joint pain?: No  Neurological Headaches?: No Dizziness?: No  Psychologic Depression?: No Anxiety?: No  Physical Exam: BP (!) 137/101   Pulse (!) 101   Ht 5\' 9"  (1.753 m)   Wt 180 lb 14.4 oz (82.1 kg)   BMI 26.71 kg/m   Constitutional:  Alert and oriented, No acute distress. HEENT: Pala AT, moist mucus membranes.  Trachea midline, no masses. Cardiovascular: No clubbing, cyanosis, or edema. Respiratory: Normal respiratory effort, no increased work of breathing. GI: Abdomen is soft, nontender, nondistended, no abdominal masses GU: No CVA tenderness. Soft nontender nondistended. Incisions well-healed. Skin: No rashes, bruises or suspicious lesions. Lymph: No cervical or inguinal adenopathy. Neurologic: Grossly intact, no focal deficits, moving all 4 extremities. Psychiatric: Normal mood and affect.  Laboratory Data: Lab Results  Component Value Date   WBC 3.4 (L) 07/16/2016   HGB 13.0 07/16/2016   HCT 37.8 (L) 07/16/2016   MCV 97.3 07/16/2016   PLT 276 07/16/2016    Lab Results  Component Value Date   CREATININE 1.22 07/16/2016    Lab Results  Component Value Date   PSA 5.99 (H) 02/05/2016   PSA 2.6 08/24/2014    No results found for: TESTOSTERONE  No results found for: HGBA1C  Urinalysis    Component Value Date/Time   APPEARANCEUR Hazy (A) 05/28/2016 1200   GLUCOSEU Negative 05/28/2016 1200   BILIRUBINUR Negative 05/28/2016 1200   PROTEINUR Negative 05/28/2016 1200   NITRITE Negative 05/28/2016 1200   LEUKOCYTESUR Trace (A) 05/28/2016 1200    Assessment & Plan:    1. pT3aN0Mx prostate cancer -No evidence  of disease -We'll prescribe the patient generic sildenafil 1-5 tabs by mouth daily when necessary. He was warned of the risk of priapism and the need for emergent intervention.  Return in about 3 months (around 01/28/2017) for PSA prior.  Nickie Retort, MD  [facility] [address] [place], [place] [address] [phone]

## 2016-11-05 DIAGNOSIS — H401122 Primary open-angle glaucoma, left eye, moderate stage: Secondary | ICD-10-CM | POA: Diagnosis not present

## 2016-11-12 DIAGNOSIS — H401122 Primary open-angle glaucoma, left eye, moderate stage: Secondary | ICD-10-CM | POA: Diagnosis not present

## 2016-11-20 NOTE — Telephone Encounter (Signed)
Opened in error

## 2017-01-26 ENCOUNTER — Encounter: Payer: Self-pay | Admitting: Urology

## 2017-01-26 ENCOUNTER — Other Ambulatory Visit: Payer: PPO

## 2017-01-28 ENCOUNTER — Encounter: Payer: Self-pay | Admitting: Family Medicine

## 2017-01-28 ENCOUNTER — Ambulatory Visit (INDEPENDENT_AMBULATORY_CARE_PROVIDER_SITE_OTHER): Payer: PPO | Admitting: Urology

## 2017-01-28 ENCOUNTER — Encounter: Payer: Self-pay | Admitting: Urology

## 2017-01-28 ENCOUNTER — Ambulatory Visit (INDEPENDENT_AMBULATORY_CARE_PROVIDER_SITE_OTHER): Payer: PPO | Admitting: Family Medicine

## 2017-01-28 VITALS — BP 152/102 | HR 96 | Temp 98.1°F | Resp 18 | Ht 69.0 in | Wt 182.4 lb

## 2017-01-28 DIAGNOSIS — C61 Malignant neoplasm of prostate: Secondary | ICD-10-CM | POA: Diagnosis not present

## 2017-01-28 DIAGNOSIS — I10 Essential (primary) hypertension: Secondary | ICD-10-CM | POA: Diagnosis not present

## 2017-01-28 DIAGNOSIS — R739 Hyperglycemia, unspecified: Secondary | ICD-10-CM | POA: Diagnosis not present

## 2017-01-28 DIAGNOSIS — E785 Hyperlipidemia, unspecified: Secondary | ICD-10-CM | POA: Diagnosis not present

## 2017-01-28 DIAGNOSIS — F5102 Adjustment insomnia: Secondary | ICD-10-CM

## 2017-01-28 DIAGNOSIS — Z9079 Acquired absence of other genital organ(s): Secondary | ICD-10-CM | POA: Diagnosis not present

## 2017-01-28 LAB — CBC WITH DIFFERENTIAL/PLATELET
BASOS ABS: 0 {cells}/uL (ref 0–200)
Basophils Relative: 0 %
EOS ABS: 31 {cells}/uL (ref 15–500)
Eosinophils Relative: 1 %
HEMATOCRIT: 47.3 % (ref 38.5–50.0)
Hemoglobin: 16 g/dL (ref 13.2–17.1)
Lymphocytes Relative: 37 %
Lymphs Abs: 1147 cells/uL (ref 850–3900)
MCH: 32.8 pg (ref 27.0–33.0)
MCHC: 33.8 g/dL (ref 32.0–36.0)
MCV: 96.9 fL (ref 80.0–100.0)
MONO ABS: 434 {cells}/uL (ref 200–950)
MPV: 8.9 fL (ref 7.5–12.5)
Monocytes Relative: 14 %
NEUTROS ABS: 1488 {cells}/uL — AB (ref 1500–7800)
NEUTROS PCT: 48 %
Platelets: 318 10*3/uL (ref 140–400)
RBC: 4.88 MIL/uL (ref 4.20–5.80)
RDW: 13.8 % (ref 11.0–15.0)
WBC: 3.1 10*3/uL — ABNORMAL LOW (ref 3.8–10.8)

## 2017-01-28 LAB — TSH: TSH: 0.89 mIU/L (ref 0.40–4.50)

## 2017-01-28 MED ORDER — TRAZODONE HCL 50 MG PO TABS: 25.0000 mg | ORAL_TABLET | Freq: Every evening | ORAL | 0 refills | Status: DC | PRN

## 2017-01-28 MED ORDER — LOVASTATIN 20 MG PO TABS: 20.0000 mg | ORAL_TABLET | Freq: Every day | ORAL | 1 refills | Status: DC

## 2017-01-28 MED ORDER — OLMESARTAN MEDOXOMIL-HCTZ 40-25 MG PO TABS: 0.5000 | ORAL_TABLET | Freq: Every day | ORAL | 0 refills | Status: DC

## 2017-01-28 NOTE — Progress Notes (Signed)
Name: Ernest Sweeney   MRN: [id number]    DOB: May 15, 1951   Date:01/28/2017       Progress Note  Subjective  Chief Complaint  Chief Complaint  Patient presents with  . Hypertension    pt stated that his B/P has been elevated 164/103 at urology apppt this morning    HPI  HTN: he has been off lisinopril since March 2017  and bp was at goal, however over the past couple of visits with Urologist bp has been spiking, he states he feels nervous about the upcoming PSA levels, and recurrence of prostate cancer, not sleeping well because of it and has been waking up very early. He does not want to take medication for depression, but willing to take something for sleep.  Denies chest pain ( except for intermittent sharp feeling that lasts seconds ) no  palpitation, last EKG was [facility] at [facility] 06/2016 . He has quit smoking08/2017  Hyperlipidemia: taking Lovastatin, no side effect: Denies myalgias or SOB.  Prostate Cancer : recent prostatectomy done by Dr. Pilar Jarvis on 07/15/2016. Gleason 3+4 . Having ED but not urinary retention symptoms, urinary frequency has improved since surgery. He is feeling anxious about possible recurrence of cancer.   Hyperglycemia: glucose 101 back 07/2016, he states occasionally has polydipsia, no polyphagia or polyuria   Patient Active Problem List   Diagnosis Date Noted  . Glaucoma, left eye 08/04/2016  . Prostate cancer ([place]) 07/15/2016  . History of prostatectomy 07/15/2016  . Vitamin D deficiency 05/31/2015  . Hyperlipidemia LDL goal <100 05/31/2015  . Seasonal allergic rhinitis 05/31/2015  . Chronic pain of both shoulders 05/31/2015  . Family history of malignant neoplasm of prostate 07/26/2012  . Hematuria, microscopic 07/26/2012    Past Surgical History:  Procedure Laterality Date  . COLONOSCOPY  2010   Dr Sula Rumple  . excision skin cyst  06-19-14   Dr. Festus Aloe CYST  . PELVIC LYMPH NODE DISSECTION N/A 07/15/2016   Procedure: PELVIC LYMPH NODE  DISSECTION;  Surgeon: Nickie Retort, MD;  Location: [facility] ORS;  Service: Urology;  Laterality: N/A;  . ROBOT ASSISTED LAPAROSCOPIC RADICAL PROSTATECTOMY N/A 07/15/2016   Procedure: ROBOTIC ASSISTED LAPAROSCOPIC RADICAL PROSTATECTOMY;  Surgeon: Nickie Retort, MD;  Location: [facility] ORS;  Service: Urology;  Laterality: N/A;    Family History  Problem Relation Age of Onset  . Heart disease Mother   . Diabetes Father   . Cancer Sister        Breast  . Cancer Brother        Prostate    Social History   Social History  . Marital status: Married    Spouse name: N/A  . Number of children: N/A  . Years of education: N/A   Occupational History  . Not on file.   Social History Main Topics  . Smoking status: Former Smoker    Packs/day: 0.50    Years: 20.00    Types: Cigarettes    Quit date: 03/23/2016  . Smokeless tobacco: Never Used  . Alcohol use 0.0 oz/week     Comment: social  . Drug use: No  . Sexual activity: Not on file   Other Topics Concern  . Not on file   Social History Narrative  . No narrative on file     Current Outpatient Prescriptions:  .  aspirin 81 MG tablet, Take 81 mg by mouth daily., Disp: , Rfl:  .  Cholecalciferol (VITAMIN D) 2000 units CAPS, Take 1 capsule (2,000  Units total) by mouth daily., Disp: 30 capsule, Rfl: 0 .  latanoprost (XALATAN) 0.005 % ophthalmic solution, Place 1 drop into the left eye at bedtime., Disp: , Rfl:  .  lovastatin (MEVACOR) 20 MG tablet, Take 1 tablet (20 mg total) by mouth at bedtime., Disp: 90 tablet, Rfl: 1 .  olmesartan-hydrochlorothiazide (BENICAR HCT) 40-25 MG tablet, Take 0.5-1 tablets by mouth daily. Half pill first week after that 2 daily, Disp: 30 tablet, Rfl: 0 .  sildenafil (REVATIO) 20 MG tablet, Take 1 to 5 tablets PO daily prn, Disp: 30 tablet, Rfl: 3  No Known Allergies   ROS  Constitutional: Negative for fever or weight change.  Respiratory: Negative for cough and shortness of breath.    Cardiovascular: Negative for chest pain or palpitations.  Gastrointestinal: Negative for abdominal pain, no bowel changes.  Musculoskeletal: Negative for gait problem or joint swelling.  Skin: Negative for rash.  Neurological: Negative for dizziness or headache.  No other specific complaints in a complete review of systems (except as listed in HPI above).  Objective  Vitals:   01/28/17 1022  BP: (!) 152/102  Pulse: 96  Resp: 18  Temp: 98.1 F (36.7 C)  SpO2: 97%  Weight: 182 lb 7 oz (82.8 kg)  Height: 5\' 9"  (1.753 m)    Body mass index is 26.94 kg/m.  Physical Exam  Constitutional: Patient appears well-developed and well-nourished. Overweight.  No distress.  HEENT: head atraumatic, normocephalic, pupils equal and reactive to light,  neck supple, throat within normal limits Cardiovascular: Normal rate, regular rhythm and normal heart sounds.  No murmur heard. No BLE edema. Pulmonary/Chest: Effort normal and breath sounds normal. No respiratory distress. Abdominal: Soft.  There is no tenderness. Psychiatric: Patient has a normal mood and affect. behavior is normal. Judgment and thought content normal.   PHQ2/9: Depression screen [facility] 2/9 08/04/2016 02/05/2016 10/03/2015  Decreased Interest 1 0 1  Down, Depressed, Hopeless 2 0 1  PHQ - 2 Score 3 0 2  Altered sleeping 0 - 1  Tired, decreased energy 0 - 1  Change in appetite 0 - 1  Feeling bad or failure about yourself  0 - 1  Trouble concentrating 0 - 1  Moving slowly or fidgety/restless 0 - 0  Suicidal thoughts 0 - 0  PHQ-9 Score 3 - 7  Difficult doing work/chores Somewhat difficult - Not difficult at all    Fall Risk: Fall Risk  08/04/2016 02/05/2016 10/03/2015  Falls in the past year? No No No     Assessment & Plan  1. Essential hypertension  Off medication and bp is going up again, we will recheck labs, start Benicar/hctz half pill and increase to one daily after one week  - COMPLETE METABOLIC PANEL WITH GFR -  CBC with Differential/Platelet - TSH - olmesartan-hydrochlorothiazide (BENICAR HCT) 40-25 MG tablet; Take 0.5-1 tablets by mouth daily. Half pill first week after that 2 daily  Dispense: 30 tablet; Refill: 0  2. Hyperlipidemia LDL goal <100  - lovastatin (MEVACOR) 20 MG tablet; Take 1 tablet (20 mg total) by mouth at bedtime.  Dispense: 90 tablet; Refill: 1 - Lipid panel  3. History of prostatectomy  Feb 2018 , still nervous about it  4. Hyperglycemia  - Hemoglobin A1c   5. Adjustment insomnia  Discussed medication, likely from depression  - traZODone (DESYREL) 50 MG tablet; Take 0.5-1 tablets (25-50 mg total) by mouth at bedtime as needed for sleep.  Dispense: 30 tablet; Refill: 0

## 2017-01-28 NOTE — Progress Notes (Signed)
01/28/2017 9:55 AM   Ernest Sweeney [birth year] [id number]  Referring provider: [name], MD [address] [place], [place] [address]  Chief Complaint  Patient presents with  . Prostate Cancer    HPI: The patient is a 66 year old gentleman presents today after undergoing a robotic prostatectomy with pelvic lymph node dissection on July 15, 2016. PSA in April 2018 is undetectable. PSA today is pending  At this time, he is completely continent. He does not wear a pad during the day. Very rarely he will leak when he coughs or sneezes. He does wear protective pad at night that is mostly dry in the morning.  In regards to erections, the patient has not had one. He feels the sensation of starting to get one but is unable to obtain one. He did have them preoperatively. He was prescribed next [facility] of his last appointment. Usually try to figure one pill a time without significant change in his symptoms.  Pathology: [id number] Gleason 3+4 prostae cancer  Focal extraprostatic extension +perineural invasion Margins negative Blt pelvic LN negative Pretreatment PSA: 5.99       PMH: Past Medical History:  Diagnosis Date  . Anxiety    Panic attacks in the past  . Arthritis   . Benign prostatic hypertrophy without lower urinary tract symptoms   . Cancer [facility])    Prostate  . Colon polyp   . GERD (gastroesophageal reflux disease)   . Glaucoma (increased eye pressure)    Left eye only  . Hyperlipidemia   . Hypertension     Surgical History: Past Surgical History:  Procedure Laterality Date  . COLONOSCOPY  2010   Dr Sula Rumple  . excision skin cyst  06-19-14   Dr. Festus Aloe CYST  . PELVIC LYMPH NODE DISSECTION N/A 07/15/2016   Procedure: PELVIC LYMPH NODE DISSECTION;  Surgeon: Nickie Retort, MD;  Location: [facility] ORS;  Service: Urology;  Laterality: N/A;  . ROBOT ASSISTED LAPAROSCOPIC RADICAL PROSTATECTOMY N/A 07/15/2016   Procedure: ROBOTIC ASSISTED  LAPAROSCOPIC RADICAL PROSTATECTOMY;  Surgeon: Nickie Retort, MD;  Location: [facility] ORS;  Service: Urology;  Laterality: N/A;    Home Medications:  Allergies as of 01/28/2017   No Known Allergies     Medication List       Accurate as of 01/28/17  9:55 AM. Always use your most recent med list.          aspirin 81 MG tablet Take 81 mg by mouth daily.   fluticasone 50 MCG/ACT nasal spray Commonly known as:  FLONASE Place 2 sprays into both nostrils daily.   latanoprost 0.005 % ophthalmic solution Commonly known as:  XALATAN Place 1 drop into the left eye at bedtime.   lovastatin 20 MG tablet Commonly known as:  MEVACOR Take 1 tablet (20 mg total) by mouth at bedtime.   sildenafil 20 MG tablet Commonly known as:  REVATIO Take 1 to 5 tablets PO daily prn   Vitamin D 2000 units Caps Take 1 capsule (2,000 Units total) by mouth daily.       Allergies: No Known Allergies  Family History: Family History  Problem Relation Age of Onset  . Heart disease Mother   . Diabetes Father   . Cancer Sister        Breast  . Cancer Brother        Prostate    Social History:  reports that he quit smoking about 10 months ago. His smoking use included Cigarettes. He has  a 10.00 pack-year smoking history. He has never used smokeless tobacco. He reports that he drinks alcohol. He reports that he does not use drugs.  ROS: UROLOGY Frequent Urination?: No Hard to postpone urination?: No Burning/pain with urination?: No Get up at night to urinate?: No Leakage of urine?: No Urine stream starts and stops?: No Trouble starting stream?: No Do you have to strain to urinate?: No Blood in urine?: No Urinary tract infection?: No Sexually transmitted disease?: No Injury to kidneys or bladder?: No Painful intercourse?: No Weak stream?: No Erection problems?: No Penile pain?: No  Gastrointestinal Nausea?: No Vomiting?: No Indigestion/heartburn?: No Diarrhea?: No Constipation?:  No  Constitutional Fever: No Night sweats?: No Weight loss?: No Fatigue?: No  Skin Skin rash/lesions?: No Itching?: No  Eyes Blurred vision?: No Double vision?: No  Ears/Nose/Throat Sore throat?: No Sinus problems?: No  Hematologic/Lymphatic Swollen glands?: No Easy bruising?: No  Cardiovascular Leg swelling?: No Chest pain?: No  Respiratory Cough?: No Shortness of breath?: No  Endocrine Excessive thirst?: No  Musculoskeletal Back pain?: No Joint pain?: No  Neurological Headaches?: No Dizziness?: No  Psychologic Depression?: No Anxiety?: No  Physical Exam: BP (!) 164/103 (BP Location: Left Arm, Patient Position: Sitting, Cuff Size: Normal)   Pulse 96   Ht 5\' 9"  (1.753 m)   Wt 182 lb 9.6 oz (82.8 kg)   BMI 26.97 kg/m   Constitutional:  Alert and oriented, No acute distress. HEENT: [place] AT, moist mucus membranes.  Trachea midline, no masses. Cardiovascular: No clubbing, cyanosis, or edema. Respiratory: Normal respiratory effort, no increased work of breathing. GI: Abdomen is soft, nontender, nondistended, no abdominal masses GU: No CVA tenderness.  Skin: No rashes, bruises or suspicious lesions. Lymph: No cervical or inguinal adenopathy. Neurologic: Grossly intact, no focal deficits, moving all 4 extremities. Psychiatric: Normal mood and affect.  Laboratory Data: Lab Results  Component Value Date   WBC 3.4 (L) 07/16/2016   HGB 13.0 07/16/2016   HCT 37.8 (L) 07/16/2016   MCV 97.3 07/16/2016   PLT 276 07/16/2016    Lab Results  Component Value Date   CREATININE 1.22 07/16/2016    Lab Results  Component Value Date   PSA 5.99 (H) 02/05/2016   PSA 2.6 08/24/2014    No results found for: TESTOSTERONE  No results found for: HGBA1C  Urinalysis    Component Value Date/Time   APPEARANCEUR Hazy (A) 05/28/2016 1200   GLUCOSEU Negative 05/28/2016 1200   BILIRUBINUR Negative 05/28/2016 1200   PROTEINUR Negative 05/28/2016 1200   NITRITE  Negative 05/28/2016 1200   LEUKOCYTESUR Trace (A) 05/28/2016 1200    Assessment & Plan:    1. pT3aN0Mx prostate cancer -check PSA today -Recommend to the patient that he try up to 5 tablets of generic sildenafil as needed.  Return in about 6 months (around 07/31/2017) for PSA prior.  Nickie Retort, MD  [facility] [address] [place], [place] [address] [phone]

## 2017-01-29 LAB — COMPLETE METABOLIC PANEL WITH GFR
ALBUMIN: 4.1 g/dL (ref 3.6–5.1)
ALK PHOS: 47 U/L (ref 40–115)
ALT: 15 U/L (ref 9–46)
AST: 19 U/L (ref 10–35)
BUN: 13 mg/dL (ref 7–25)
CHLORIDE: 102 mmol/L (ref 98–110)
CO2: 21 mmol/L (ref 20–31)
Calcium: 10 mg/dL (ref 8.6–10.3)
Creat: 1.23 mg/dL (ref 0.70–1.25)
GFR, Est African American: 71 mL/min (ref >=60)
GFR, Est Non African American: 61 mL/min (ref >=60)
GLUCOSE: 84 mg/dL (ref 65–99)
POTASSIUM: 4.8 mmol/L (ref 3.5–5.3)
SODIUM: 139 mmol/L (ref 135–146)
Total Bilirubin: 1.1 mg/dL (ref 0.2–1.2)
Total Protein: 7.4 g/dL (ref 6.1–8.1)

## 2017-01-29 LAB — HEMOGLOBIN A1C
HEMOGLOBIN A1C: 5.1 % (ref <5.7)
MEAN PLASMA GLUCOSE: 100 mg/dL

## 2017-01-29 LAB — PSA: Prostate Specific Ag, Serum: <0.1 ng/mL (ref 0.0–4.0)

## 2017-01-29 LAB — LIPID PANEL
CHOL/HDL RATIO: 2.4 ratio (ref <5.0)
Cholesterol: 199 mg/dL (ref <200)
HDL: 83 mg/dL (ref >40)
LDL Cholesterol: 100 mg/dL — ABNORMAL HIGH (ref <100)
Triglycerides: 78 mg/dL (ref <150)
VLDL: 16 mg/dL (ref <30)

## 2017-01-29 MED ORDER — OLMESARTAN MEDOXOMIL-HCTZ 40-25 MG PO TABS: 0.5000 | ORAL_TABLET | Freq: Every day | ORAL | 0 refills | Status: DC

## 2017-01-29 NOTE — Addendum Note (Signed)
Addended by: Inda Coke on: 01/29/2017 08:50 AM   Modules accepted: Orders

## 2017-02-01 ENCOUNTER — Telehealth: Payer: Self-pay

## 2017-02-01 ENCOUNTER — Telehealth: Payer: Self-pay | Admitting: Family Medicine

## 2017-02-01 NOTE — Telephone Encounter (Signed)
Patient notified

## 2017-02-01 NOTE — Telephone Encounter (Signed)
-----   Message from [name], MD sent at 01/29/2017  5:28 PM EDT ----- Sugar, kidney and transaminases are within normal limits Lipid panel is at goal  Normal hgbA1C Normal TSH WBC is stable, ( towards low end of normal)

## 2017-02-01 NOTE — Telephone Encounter (Signed)
-----   Message from Nickie Retort, MD sent at 02/01/2017 10:21 AM EDT ----- Please let patient know PSA was undetectable. No sign of prostate cancer  Thanks  ----- Message ----- From: Orlene Erm, CMA Sent: 01/29/2017   3:54 PM To: Nickie Retort, MD    ----- Message ----- From: Interface, [facility] Lab Results In Sent: 01/29/2017   2:34 PM To: [facility]

## 2017-02-05 ENCOUNTER — Encounter: Payer: Self-pay | Admitting: Family Medicine

## 2017-02-05 ENCOUNTER — Ambulatory Visit (INDEPENDENT_AMBULATORY_CARE_PROVIDER_SITE_OTHER): Payer: PPO | Admitting: Family Medicine

## 2017-02-05 VITALS — BP 110/68 | HR 105 | Temp 98.0°F | Resp 16 | Ht 69.0 in | Wt 183.2 lb

## 2017-02-05 DIAGNOSIS — Z122 Encounter for screening for malignant neoplasm of respiratory organs: Secondary | ICD-10-CM

## 2017-02-05 DIAGNOSIS — Z0000 Encounter for general adult medical examination without abnormal findings: Secondary | ICD-10-CM

## 2017-02-05 DIAGNOSIS — Z23 Encounter for immunization: Secondary | ICD-10-CM

## 2017-02-05 DIAGNOSIS — G3184 Mild cognitive impairment, so stated: Secondary | ICD-10-CM | POA: Diagnosis not present

## 2017-02-05 DIAGNOSIS — Z136 Encounter for screening for cardiovascular disorders: Secondary | ICD-10-CM | POA: Diagnosis not present

## 2017-02-05 DIAGNOSIS — I10 Essential (primary) hypertension: Secondary | ICD-10-CM

## 2017-02-05 MED ORDER — OLMESARTAN MEDOXOMIL-HCTZ 40-12.5 MG PO TABS: 1.0000 | ORAL_TABLET | Freq: Every day | ORAL | 1 refills | Status: DC

## 2017-02-05 NOTE — Patient Instructions (Signed)
Preventive Care 65 Years and Older, Male Preventive care refers to lifestyle choices and visits with your health care provider that can promote health and wellness. What does preventive care include?  A yearly physical exam. This is also called an annual well check.  Dental exams once or twice a year.  Routine eye exams. Ask your health care provider how often you should have your eyes checked.  Personal lifestyle choices, including: ? Daily care of your teeth and gums. ? Regular physical activity. ? Eating a healthy diet. ? Avoiding tobacco and drug use. ? Limiting alcohol use. ? Practicing safe sex. ? Taking low doses of aspirin every day. ? Taking vitamin and mineral supplements as recommended by your health care provider. What happens during an annual well check? The services and screenings done by your health care provider during your annual well check will depend on your age, overall health, lifestyle risk factors, and family history of disease. Counseling Your health care provider may ask you questions about your:  Alcohol use.  Tobacco use.  Drug use.  Emotional well-being.  Home and relationship well-being.  Sexual activity.  Eating habits.  History of falls.  Memory and ability to understand (cognition).  Work and work environment.  Screening You may have the following tests or measurements:  Height, weight, and BMI.  Blood pressure.  Lipid and cholesterol levels. These may be checked every 5 years, or more frequently if you are over 50 years old.  Skin check.  Lung cancer screening. You may have this screening every year starting at age 55 if you have a 30-pack-year history of smoking and currently smoke or have quit within the past 15 years.  Fecal occult blood test (FOBT) of the stool. You may have this test every year starting at age 50.  Flexible sigmoidoscopy or colonoscopy. You may have a sigmoidoscopy every 5 years or a colonoscopy every 10  years starting at age 50.  Prostate cancer screening. Recommendations will vary depending on your family history and other risks.  Hepatitis C blood test.  Hepatitis B blood test.  Sexually transmitted disease (STD) testing.  Diabetes screening. This is done by checking your blood sugar (glucose) after you have not eaten for a while (fasting). You may have this done every 1-3 years.  Abdominal aortic aneurysm (AAA) screening. You may need this if you are a current or former smoker.  Osteoporosis. You may be screened starting at age 70 if you are at high risk.  Talk with your health care provider about your test results, treatment options, and if necessary, the need for more tests. Vaccines Your health care provider may recommend certain vaccines, such as:  Influenza vaccine. This is recommended every year.  Tetanus, diphtheria, and acellular pertussis (Tdap, Td) vaccine. You may need a Td booster every 10 years.  Varicella vaccine. You may need this if you have not been vaccinated.  Zoster vaccine. You may need this after age 60.  Measles, mumps, and rubella (MMR) vaccine. You may need at least one dose of MMR if you were born in 1957 or later. You may also need a second dose.  Pneumococcal 13-valent conjugate (PCV13) vaccine. One dose is recommended after age 65.  Pneumococcal polysaccharide (PPSV23) vaccine. One dose is recommended after age 65.  Meningococcal vaccine. You may need this if you have certain conditions.  Hepatitis A vaccine. You may need this if you have certain conditions or if you travel or work in places where you   may be exposed to hepatitis A.  Hepatitis B vaccine. You may need this if you have certain conditions or if you travel or work in places where you may be exposed to hepatitis B.  Haemophilus influenzae type b (Hib) vaccine. You may need this if you have certain risk factors.  Talk to your health care provider about which screenings and vaccines  you need and how often you need them. This information is not intended to replace advice given to you by your health care provider. Make sure you discuss any questions you have with your health care provider. Document Released: 07/26/2015 Document Revised: 03/18/2016 Document Reviewed: 04/30/2015 Elsevier Interactive Patient Education  2017 [employer].

## 2017-02-05 NOTE — Progress Notes (Signed)
Name: Ernest Sweeney   MRN: [id number]    DOB: [birth year]   Date:02/05/2017       Progress Note  Subjective  Chief Complaint  Chief Complaint  Patient presents with  . Annual Exam    HPI  Functional ability/safety issues: No Issues Hearing issues: Addressed  Activities of daily living: Discussed Home safety issues: No Issues  End Of Life Planning: Offered verbal information regarding advanced directives, healthcare power of attorney.  Preventative care, Health maintenance, Preventative health measures discussed.  Preventative screenings discussed today: lab work, colonoscopy - up to date, PSA - sees Urologist   Men age 32 to 67 years if ever smoked recommended to get a one time AAA ultrasound screening exam.We will schedule it   Low Dose CT Chest recommended if Age 35-80 years, 30 pack-year currently smoking OR have quit w/in 15years. Quit smoking in 2017 and is willing to have CT ordered  Lifestyle risk factor issued reviewed: Diet, exercise, weight management, advised patient smoking is not healthy, nutrition/diet.  Preventative health measures discussed (5-10 year plan).  Reviewed and recommended vaccinations: - Pneumovax  -due today  - Prevnar  - Annual Influenza - Zostavax - Tdap   Depression screening: Done Fall risk screening: Done Discuss ADLs/IADLs: Done  Current medical providers: See HPI  Other health risk factors identified this visit: No other issues Cognitive impairment issues: failed 6 CIT score and has notice mild short term memory loss, but does not want further evaluation at this time  All above discussed with patient. Appropriate education, counseling and referral will be made based upon the above.   HTN: we re-started bp medication one week ago, tolerating it well and denies dizziness, chest pain or SOB, however BP is towards low end of normal and we will adjust the dose  Patient Active Problem List   Diagnosis Date Noted  . Glaucoma, left eye  08/04/2016  . Prostate cancer ([place]) 07/15/2016  . History of prostatectomy 07/15/2016  . Vitamin D deficiency 05/31/2015  . Hyperlipidemia LDL goal <100 05/31/2015  . Seasonal allergic rhinitis 05/31/2015  . Chronic pain of both shoulders 05/31/2015  . Family history of malignant neoplasm of prostate 07/26/2012  . Hematuria, microscopic 07/26/2012    Past Surgical History:  Procedure Laterality Date  . COLONOSCOPY  2010   Dr Sula Rumple  . excision skin cyst  06-19-14   Dr. Festus Aloe CYST  . PELVIC LYMPH NODE DISSECTION N/A 07/15/2016   Procedure: PELVIC LYMPH NODE DISSECTION;  Surgeon: Nickie Retort, MD;  Location: [facility] ORS;  Service: Urology;  Laterality: N/A;  . ROBOT ASSISTED LAPAROSCOPIC RADICAL PROSTATECTOMY N/A 07/15/2016   Procedure: ROBOTIC ASSISTED LAPAROSCOPIC RADICAL PROSTATECTOMY;  Surgeon: Nickie Retort, MD;  Location: [facility] ORS;  Service: Urology;  Laterality: N/A;    Family History  Problem Relation Age of Onset  . Heart disease Mother   . Diabetes Father   . Cancer Sister        Breast  . Cancer Brother        Prostate    Social History   Social History  . Marital status: Married    Spouse name: N/A  . Number of children: N/A  . Years of education: N/A   Occupational History  . Not on file.   Social History Main Topics  . Smoking status: Former Smoker    Packs/day: 0.50    Years: 20.00    Types: Cigarettes    Quit date: 03/23/2016  . Smokeless tobacco:  Never Used  . Alcohol use 0.0 oz/week     Comment: social  . Drug use: No  . Sexual activity: Not on file   Other Topics Concern  . Not on file   Social History Narrative  . No narrative on file     Current Outpatient Prescriptions:  .  aspirin 81 MG tablet, Take 81 mg by mouth daily., Disp: , Rfl:  .  Cholecalciferol (VITAMIN D) 2000 units CAPS, Take 1 capsule (2,000 Units total) by mouth daily., Disp: 30 capsule, Rfl: 0 .  latanoprost (XALATAN) 0.005 % ophthalmic solution,  Place 1 drop into the left eye at bedtime., Disp: , Rfl:  .  lovastatin (MEVACOR) 20 MG tablet, Take 1 tablet (20 mg total) by mouth at bedtime., Disp: 90 tablet, Rfl: 1 .  sildenafil (REVATIO) 20 MG tablet, Take 1 to 5 tablets PO daily prn, Disp: 30 tablet, Rfl: 3 .  traZODone (DESYREL) 50 MG tablet, Take 0.5-1 tablets (25-50 mg total) by mouth at bedtime as needed for sleep., Disp: 30 tablet, Rfl: 0  No Known Allergies   ROS  Constitutional: Negative for fever or weight change.  Respiratory: Negative for cough and shortness of breath.   Cardiovascular: Negative for chest pain or palpitations.  Gastrointestinal: Negative for abdominal pain, no bowel changes.  Musculoskeletal: Negative for gait problem or joint swelling.  Skin: Negative for rash.  Neurological: Negative for dizziness or headache.  No other specific complaints in a complete review of systems (except as listed in HPI above).  Objective  Vitals:   02/05/17 1104  BP: 110/68  Pulse: (!) 105  Resp: 16  Temp: 98 F (36.7 C)  SpO2: 97%  Weight: 183 lb 4 oz (83.1 kg)  Height: '5\' 9"'  (1.753 m)    Body mass index is 27.06 kg/m.  Physical Exam  Constitutional: Patient appears well-developed and well-nourished. No distress.  HENT: Head: Normocephalic and atraumatic. Ears: B TMs ok, no erythema or effusion; Nose: Nose normal. Mouth/Throat: Oropharynx is clear and moist. No oropharyngeal exudate.  Eyes: Conjunctivae and EOM are normal. Pupils are equal, round, and reactive to light. No scleral icterus.  Neck: Normal range of motion. Neck supple. No JVD present. No thyromegaly present.  Cardiovascular: Normal rate, regular rhythm and normal heart sounds.  No murmur heard. No BLE edema. Pulmonary/Chest: Effort normal and breath sounds normal. No respiratory distress. Abdominal: Soft. Bowel sounds are normal, no distension. There is no tenderness. no masses MALE GENITALIA: not done, sees Urologist  RECTAL: not done, sees  Urologist  Musculoskeletal: Normal range of motion, no joint effusions. No gross deformities Neurological: he is alert and oriented to person, place, and time. No cranial nerve deficit. Coordination, balance, strength, speech and gait are normal.  Skin: Skin is warm and dry. No rash noted. No erythema.  Psychiatric: Patient has a normal mood and affect. behavior is normal. Judgment and thought content normal.  Recent Results (from the past 2160 hour(s))  PSA     Status: None   Collection Time: 01/28/17  9:03 AM  Result Value Ref Range   Prostate Specific Ag, Serum <0.1 0.0 - 4.0 ng/mL    Comment: Roche ECLIA methodology. According to the American Urological Association, Serum PSA should decrease and remain at undetectable levels after radical prostatectomy. The AUA defines biochemical recurrence as an initial PSA value 0.2 ng/mL or greater followed by a subsequent confirmatory PSA value 0.2 ng/mL or greater. Values obtained with different assay methods or kits cannot  be used interchangeably. Results cannot be interpreted as absolute evidence of the presence or absence of malignant disease.   COMPLETE METABOLIC PANEL WITH GFR     Status: None   Collection Time: 01/28/17 11:34 AM  Result Value Ref Range   Sodium 139 135 - 146 mmol/L   Potassium 4.8 3.5 - 5.3 mmol/L   Chloride 102 98 - 110 mmol/L   CO2 21 20 - 31 mmol/L   Glucose, Bld 84 65 - 99 mg/dL   BUN 13 7 - 25 mg/dL   Creat 1.23 0.70 - 1.25 mg/dL    Comment:   For patients > or = 66 years of age: The upper reference limit for Creatinine is approximately 13% higher for people identified as African-American.      Total Bilirubin 1.1 0.2 - 1.2 mg/dL   Alkaline Phosphatase 47 40 - 115 U/L   AST 19 10 - 35 U/L   ALT 15 9 - 46 U/L   Total Protein 7.4 6.1 - 8.1 g/dL   Albumin 4.1 3.6 - 5.1 g/dL   Calcium 10.0 8.6 - 10.3 mg/dL   GFR, Est African American 71 >=60 mL/min   GFR, Est Non African American 61 >=60 mL/min  CBC with  Differential/Platelet     Status: Abnormal   Collection Time: 01/28/17 11:34 AM  Result Value Ref Range   WBC 3.1 (L) 3.8 - 10.8 K/uL   RBC 4.88 4.20 - 5.80 MIL/uL   Hemoglobin 16.0 13.2 - 17.1 g/dL   HCT 47.3 38.5 - 50.0 %   MCV 96.9 80.0 - 100.0 fL   MCH 32.8 27.0 - 33.0 pg   MCHC 33.8 32.0 - 36.0 g/dL   RDW 13.8 11.0 - 15.0 %   Platelets 318 140 - 400 K/uL   MPV 8.9 7.5 - 12.5 fL   Neutro Abs 1,488 (L) 1,500 - 7,800 cells/uL   Lymphs Abs 1,147 850 - 3,900 cells/uL   Monocytes Absolute 434 200 - 950 cells/uL   Eosinophils Absolute 31 15 - 500 cells/uL   Basophils Absolute 0 0 - 200 cells/uL   Neutrophils Relative % 48 %   Lymphocytes Relative 37 %   Monocytes Relative 14 %   Eosinophils Relative 1 %   Basophils Relative 0 %   Smear Review Criteria for review not met   TSH     Status: None   Collection Time: 01/28/17 11:34 AM  Result Value Ref Range   TSH 0.89 0.40 - 4.50 mIU/L  Lipid panel     Status: Abnormal   Collection Time: 01/28/17 11:34 AM  Result Value Ref Range   Cholesterol 199 <200 mg/dL   Triglycerides 78 <150 mg/dL   HDL 83 >40 mg/dL   Total CHOL/HDL Ratio 2.4 <5.0 Ratio   VLDL 16 <30 mg/dL   LDL Cholesterol 100 (H) <100 mg/dL  Hemoglobin A1c     Status: None   Collection Time: 01/28/17 11:34 AM  Result Value Ref Range   Hgb A1c MFr Bld 5.1 <5.7 %    Comment:   For the purpose of screening for the presence of diabetes:   <5.7%       Consistent with the absence of diabetes 5.7-6.4 %   Consistent with increased risk for diabetes (prediabetes) >=6.5 %     Consistent with diabetes   This assay result is consistent with a decreased risk of diabetes.   Currently, no consensus exists regarding use of hemoglobin A1c for diagnosis of diabetes  in children.   According to American Diabetes Association (ADA) guidelines, hemoglobin A1c <7.0% represents optimal control in non-pregnant diabetic patients. Different metrics may apply to specific  patient populations. Standards of Medical Care in Diabetes (ADA).      Mean Plasma Glucose 100 mg/dL      PHQ2/9: Depression screen [facility] 2/9 02/05/2017 08/04/2016 02/05/2016 10/03/2015  Decreased Interest 0 1 0 1  Down, Depressed, Hopeless 0 2 0 1  PHQ - 2 Score 0 3 0 2  Altered sleeping - 0 - 1  Tired, decreased energy - 0 - 1  Change in appetite - 0 - 1  Feeling bad or failure about yourself  - 0 - 1  Trouble concentrating - 0 - 1  Moving slowly or fidgety/restless - 0 - 0  Suicidal thoughts - 0 - 0  PHQ-9 Score - 3 - 7  Difficult doing work/chores - Somewhat difficult - Not difficult at all     Fall Risk: Fall Risk  02/05/2017 08/04/2016 02/05/2016 10/03/2015  Falls in the past year? No No No No    Functional Status Survey: Is the patient deaf or have difficulty hearing?: No Does the patient have difficulty seeing, even when wearing glasses/contacts?: No Does the patient have difficulty concentrating, remembering, or making decisions?: No Does the patient have difficulty walking or climbing stairs?: No Does the patient have difficulty dressing or bathing?: No Does the patient have difficulty doing errands alone such as visiting a doctor's office or shopping?: No  Current Exercise Habits: The patient does not participate in regular exercise at present   [place] 02/05/2017  What Year? 0 points  What month? 0 points  What time? 0 points  Count back from 20 0 points  Months in reverse 0 points  Repeat phrase 8 points  Total Score 8      Assessment & Plan  1. Welcome to Medicare preventive visit  Discussed importance of 150 minutes of physical activity weekly, eat two servings of fish weekly, eat one serving of tree nuts ( cashews, pistachios, pecans, almonds.Marland Kitchen) every other day, eat 6 servings of fruit/vegetables daily and drink plenty of water and avoid sweet beverages.   2. Essential hypertension  BP towards low end of normal, we will decrease dose of HCTZ from  25 to 12.5 and monitor  - olmesartan-hydrochlorothiazide (BENICAR HCT) 40-12.5 MG tablet; Take 1 tablet by mouth daily.  Dispense: 90 tablet; Refill: 1  3. Mild cognitive impairment  Failed 6 CIT with a score of 8, discussed referral to neurologist or to return for MMS test but he would like to hold off for now He will resume cross work puzzles, consider [employer]   4. Need for pneumococcal vaccine  - Pneumococcal conjugate vaccine 13-valent IM  5. Encounter for abdominal aortic aneurysm (AAA) screening  - US ABDOMINAL AORTA SCREENING AAA; Future  6. Encounter for screening for lung cancer  - CT CHEST LUNG CA SCREEN LOW DOSE W/O CM; Future

## 2017-02-17 ENCOUNTER — Telehealth: Payer: Self-pay | Admitting: *Deleted

## 2017-02-17 NOTE — Telephone Encounter (Signed)
Received referral for initial lung cancer screening scan. Contacted patient and obtained smoking history,(former, quit in 2017, 17.5 pack year) as well as answering questions related to screening process. Patient is aware that he does not meet eligibility criteria for lung screening scan (at least 30 pack year). Discussed the benefit of patient not meeting the higher pack year history.

## 2017-02-23 ENCOUNTER — Telehealth: Payer: Self-pay

## 2017-02-23 NOTE — Telephone Encounter (Signed)
Patient states since taking the Benicar he is starting to have a reaction to them. He is having hives all over his feet and they are spreading up his legs. Wanted advise what to do and if you could change this medication for him. States even with the lower dose he is experiencing this reaction.

## 2017-02-23 NOTE — Telephone Encounter (Signed)
Could you call and see if patient could come in for a appointment for reaction to medication.

## 2017-02-23 NOTE — Telephone Encounter (Signed)
Not sure if the cause of his symptoms. Can he come in for follow up this week? He needs to take Benadryl and claritin for now

## 2017-02-24 ENCOUNTER — Encounter: Payer: Self-pay | Admitting: Family Medicine

## 2017-02-24 ENCOUNTER — Ambulatory Visit (INDEPENDENT_AMBULATORY_CARE_PROVIDER_SITE_OTHER): Payer: PPO | Admitting: Family Medicine

## 2017-02-24 VITALS — BP 118/76 | HR 109 | Temp 98.0°F | Resp 16 | Ht 69.0 in | Wt 184.2 lb

## 2017-02-24 DIAGNOSIS — R21 Rash and other nonspecific skin eruption: Secondary | ICD-10-CM | POA: Diagnosis not present

## 2017-02-24 DIAGNOSIS — I10 Essential (primary) hypertension: Secondary | ICD-10-CM | POA: Diagnosis not present

## 2017-02-24 MED ORDER — LISINOPRIL 20 MG PO TABS: 20.0000 mg | ORAL_TABLET | Freq: Every day | ORAL | 0 refills | Status: DC

## 2017-02-24 MED ORDER — TRIAMCINOLONE ACETONIDE 0.1 % EX CREA: 1.0000 "application " | TOPICAL_CREAM | Freq: Two times a day (BID) | CUTANEOUS | 0 refills | Status: DC

## 2017-02-24 NOTE — Telephone Encounter (Signed)
LMOM to inform pt to call and schedule an appt °

## 2017-02-24 NOTE — Progress Notes (Signed)
Name: Ernest Sweeney   MRN: [id number]    DOB: 1950-10-20   Date:02/24/2017       Progress Note  Subjective  Chief Complaint  Chief Complaint  Patient presents with  . Medication Reaction    reaction to benicar rash on rt foot and leg that itches pt has not taken medication in 2 days              HPI  HTN: he has been taking Benicar/hctz for months, he came in one month ago and we decreased HCTZ component to 12.5 mg. He worked in his yard on Saturday and noticed a bumpy and itchy rash on both feet but worse on the right a couple of days later. He states it is improving since he has been taking Benadryl and also stopped taking Benicar HCTZ two days ago because he was concerned it was a side effects of medication. Explained that rash likely not related to medication, but we will switch him back to Lisinopril since he tolerated it well in the past and bp is towards low end of normal   Patient Active Problem List   Diagnosis Date Noted  . Glaucoma, left eye 08/04/2016  . Prostate cancer ([place]) 07/15/2016  . History of prostatectomy 07/15/2016  . Vitamin D deficiency 05/31/2015  . Hyperlipidemia LDL goal <100 05/31/2015  . Seasonal allergic rhinitis 05/31/2015  . Chronic pain of both shoulders 05/31/2015  . Family history of malignant neoplasm of prostate 07/26/2012  . Hematuria, microscopic 07/26/2012    Past Surgical History:  Procedure Laterality Date  . COLONOSCOPY  2010   Dr Sula Rumple  . excision skin cyst  06-19-14   Dr. Festus Aloe CYST  . PELVIC LYMPH NODE DISSECTION N/A 07/15/2016   Procedure: PELVIC LYMPH NODE DISSECTION;  Surgeon: Nickie Retort, MD;  Location: [facility] ORS;  Service: Urology;  Laterality: N/A;  . ROBOT ASSISTED LAPAROSCOPIC RADICAL PROSTATECTOMY N/A 07/15/2016   Procedure: ROBOTIC ASSISTED LAPAROSCOPIC RADICAL PROSTATECTOMY;  Surgeon: Nickie Retort, MD;  Location: [facility] ORS;  Service: Urology;  Laterality: N/A;    Family History  Problem Relation  Age of Onset  . Heart disease Mother   . Diabetes Father   . Cancer Sister        Breast  . Cancer Brother        Prostate    Social History   Social History  . Marital status: Married    Spouse name: N/A  . Number of children: N/A  . Years of education: N/A   Occupational History  . Not on file.   Social History Main Topics  . Smoking status: Former Smoker    Packs/day: 0.50    Years: 20.00    Types: Cigarettes    Quit date: 03/23/2016  . Smokeless tobacco: Never Used  . Alcohol use 0.0 oz/week     Comment: social  . Drug use: No  . Sexual activity: Not on file   Other Topics Concern  . Not on file   Social History Narrative  . No narrative on file     Current Outpatient Prescriptions:  .  aspirin 81 MG tablet, Take 81 mg by mouth daily., Disp: , Rfl:  .  Cholecalciferol (VITAMIN D) 2000 units CAPS, Take 1 capsule (2,000 Units total) by mouth daily., Disp: 30 capsule, Rfl: 0 .  latanoprost (XALATAN) 0.005 % ophthalmic solution, Place 1 drop into the left eye at bedtime., Disp: , Rfl:  .  lovastatin (MEVACOR) 20 MG  tablet, Take 1 tablet (20 mg total) by mouth at bedtime., Disp: 90 tablet, Rfl: 1 .  sildenafil (REVATIO) 20 MG tablet, Take 1 to 5 tablets PO daily prn, Disp: 30 tablet, Rfl: 3 .  traZODone (DESYREL) 50 MG tablet, Take 0.5-1 tablets (25-50 mg total) by mouth at bedtime as needed for sleep., Disp: 30 tablet, Rfl: 0 .  lisinopril (PRINIVIL,ZESTRIL) 20 MG tablet, Take 1 tablet (20 mg total) by mouth daily., Disp: 90 tablet, Rfl: 0 .  triamcinolone cream (KENALOG) 0.1 %, Apply 1 application topically 2 (two) times daily., Disp: 45 g, Rfl: 0  No Known Allergies   ROS  Ten systems reviewed and is negative except as mentioned in HPI   Objective  Vitals:   02/24/17 1439  BP: 118/76  Pulse: (!) 109  Resp: 16  Temp: 98 F (36.7 C)  SpO2: 98%  Weight: 184 lb 3 oz (83.5 kg)  Height: '5\' 9"'  (1.753 m)    Body mass index is 27.2 kg/m.  Physical  Exam  Constitutional: Patient appears well-developed and well-nourished. Overweight  No distress.  HEENT: head atraumatic, normocephalic, pupils equal and reactive to light, , neck supple, throat within normal limits Cardiovascular: Normal rate, regular rhythm and normal heart sounds.  No murmur heard. No BLE edema. Pulmonary/Chest: Effort normal and breath sounds normal. No respiratory distress. Abdominal: Soft.  There is no tenderness. Skin: multiple nodules, likely from insect bites on right foot and ankle, lower leg, a few on left side, no whelps, signs of infection such as oozing.  Psychiatric: Patient has a normal mood and affect. behavior is normal. Judgment and thought content normal.  Recent Results (from the past 2160 hour(s))  PSA     Status: None   Collection Time: 01/28/17  9:03 AM  Result Value Ref Range   Prostate Specific Ag, Serum <0.1 0.0 - 4.0 ng/mL    Comment: Roche ECLIA methodology. According to the American Urological Association, Serum PSA should decrease and remain at undetectable levels after radical prostatectomy. The AUA defines biochemical recurrence as an initial PSA value 0.2 ng/mL or greater followed by a subsequent confirmatory PSA value 0.2 ng/mL or greater. Values obtained with different assay methods or kits cannot be used interchangeably. Results cannot be interpreted as absolute evidence of the presence or absence of malignant disease.   COMPLETE METABOLIC PANEL WITH GFR     Status: None   Collection Time: 01/28/17 11:34 AM  Result Value Ref Range   Sodium 139 135 - 146 mmol/L   Potassium 4.8 3.5 - 5.3 mmol/L   Chloride 102 98 - 110 mmol/L   CO2 21 20 - 31 mmol/L   Glucose, Bld 84 65 - 99 mg/dL   BUN 13 7 - 25 mg/dL   Creat 1.23 0.70 - 1.25 mg/dL    Comment:   For patients > or = 66 years of age: The upper reference limit for Creatinine is approximately 13% higher for people identified as African-American.      Total Bilirubin 1.1 0.2 -  1.2 mg/dL   Alkaline Phosphatase 47 40 - 115 U/L   AST 19 10 - 35 U/L   ALT 15 9 - 46 U/L   Total Protein 7.4 6.1 - 8.1 g/dL   Albumin 4.1 3.6 - 5.1 g/dL   Calcium 10.0 8.6 - 10.3 mg/dL   GFR, Est African American 71 >=60 mL/min   GFR, Est Non African American 61 >=60 mL/min  CBC with Differential/Platelet  Status: Abnormal   Collection Time: 01/28/17 11:34 AM  Result Value Ref Range   WBC 3.1 (L) 3.8 - 10.8 K/uL   RBC 4.88 4.20 - 5.80 MIL/uL   Hemoglobin 16.0 13.2 - 17.1 g/dL   HCT 47.3 38.5 - 50.0 %   MCV 96.9 80.0 - 100.0 fL   MCH 32.8 27.0 - 33.0 pg   MCHC 33.8 32.0 - 36.0 g/dL   RDW 13.8 11.0 - 15.0 %   Platelets 318 140 - 400 K/uL   MPV 8.9 7.5 - 12.5 fL   Neutro Abs 1,488 (L) 1,500 - 7,800 cells/uL   Lymphs Abs 1,147 850 - 3,900 cells/uL   Monocytes Absolute 434 200 - 950 cells/uL   Eosinophils Absolute 31 15 - 500 cells/uL   Basophils Absolute 0 0 - 200 cells/uL   Neutrophils Relative % 48 %   Lymphocytes Relative 37 %   Monocytes Relative 14 %   Eosinophils Relative 1 %   Basophils Relative 0 %   Smear Review Criteria for review not met   TSH     Status: None   Collection Time: 01/28/17 11:34 AM  Result Value Ref Range   TSH 0.89 0.40 - 4.50 mIU/L  Lipid panel     Status: Abnormal   Collection Time: 01/28/17 11:34 AM  Result Value Ref Range   Cholesterol 199 <200 mg/dL   Triglycerides 78 <150 mg/dL   HDL 83 >40 mg/dL   Total CHOL/HDL Ratio 2.4 <5.0 Ratio   VLDL 16 <30 mg/dL   LDL Cholesterol 100 (H) <100 mg/dL  Hemoglobin A1c     Status: None   Collection Time: 01/28/17 11:34 AM  Result Value Ref Range   Hgb A1c MFr Bld 5.1 <5.7 %    Comment:   For the purpose of screening for the presence of diabetes:   <5.7%       Consistent with the absence of diabetes 5.7-6.4 %   Consistent with increased risk for diabetes (prediabetes) >=6.5 %     Consistent with diabetes   This assay result is consistent with a decreased risk of diabetes.   Currently, no  consensus exists regarding use of hemoglobin A1c for diagnosis of diabetes in children.   According to American Diabetes Association (ADA) guidelines, hemoglobin A1c <7.0% represents optimal control in non-pregnant diabetic patients. Different metrics may apply to specific patient populations. Standards of Medical Care in Diabetes (ADA).      Mean Plasma Glucose 100 mg/dL      PHQ2/9: Depression screen [facility] 2/9 02/05/2017 08/04/2016 02/05/2016 10/03/2015  Decreased Interest 0 1 0 1  Down, Depressed, Hopeless 0 2 0 1  PHQ - 2 Score 0 3 0 2  Altered sleeping - 0 - 1  Tired, decreased energy - 0 - 1  Change in appetite - 0 - 1  Feeling bad or failure about yourself  - 0 - 1  Trouble concentrating - 0 - 1  Moving slowly or fidgety/restless - 0 - 0  Suicidal thoughts - 0 - 0  PHQ-9 Score - 3 - 7  Difficult doing work/chores - Somewhat difficult - Not difficult at all     Fall Risk: Fall Risk  02/05/2017 08/04/2016 02/05/2016 10/03/2015  Falls in the past year? No No No No     Assessment & Plan  1. Rash  - triamcinolone cream (KENALOG) 0.1 %; Apply 1 application topically 2 (two) times daily.  Dispense: 45 g; Refill: 0  2. Essential hypertension  -  lisinopril (PRINIVIL,ZESTRIL) 20 MG tablet; Take half to 1 tablet (10-20 mg total) by mouth daily.  Dispense: 90 tablet; Refill: 0

## 2017-03-31 ENCOUNTER — Telehealth: Payer: Self-pay

## 2017-03-31 NOTE — Telephone Encounter (Signed)
I contacted this patient to inform him that he has been scheduled to have his [place] AAA screening on Wednesday, April 07, 2017 at 8am at the [facility], but there was no answer. A message was left for this patient with that information on his voicemail. Patient was instructed to arrive 15 mins early and not to have anything to eat or drink 6 hrs prior to imaging.   Patient was given our number in case he had additional questions as well as the number to scheduling [phone]) in case that appt date and time was not good for him.

## 2017-04-07 ENCOUNTER — Ambulatory Visit
Admission: RE | Admit: 2017-04-07 | Discharge: 2017-04-07 | Disposition: A | Discharge status: Home / Self Care | Payer: PPO | Source: Ambulatory Visit | Attending: Family Medicine | Admitting: Family Medicine

## 2017-04-07 ENCOUNTER — Encounter: Payer: Self-pay | Admitting: Family Medicine

## 2017-04-07 DIAGNOSIS — N281 Cyst of kidney, acquired: Secondary | ICD-10-CM | POA: Insufficient documentation

## 2017-04-07 DIAGNOSIS — Z136 Encounter for screening for cardiovascular disorders: Secondary | ICD-10-CM | POA: Diagnosis not present

## 2017-04-13 ENCOUNTER — Telehealth: Payer: Self-pay | Admitting: *Deleted

## 2017-04-13 NOTE — Telephone Encounter (Signed)
Received referral for low dose lung cancer screening CT scan. Message left at phone number listed in EMR for patient to call me back to facilitate scheduling scan.  

## 2017-04-14 ENCOUNTER — Telehealth: Payer: Self-pay | Admitting: *Deleted

## 2017-04-14 DIAGNOSIS — Z87891 Personal history of nicotine dependence: Secondary | ICD-10-CM

## 2017-04-14 DIAGNOSIS — Z122 Encounter for screening for malignant neoplasm of respiratory organs: Secondary | ICD-10-CM

## 2017-04-14 NOTE — Telephone Encounter (Signed)
Received referral for initial lung cancer screening scan. Contacted patient and obtained smoking history,(former, quit 03/23/16, 30 pack year) as well as answering questions related to screening process. Patient denies signs of lung cancer such as weight loss or hemoptysis. Patient denies comorbidity that would prevent curative treatment if lung cancer were found. Patient is scheduled for shared decision making visit and CT scan on 04/21/17.

## 2017-04-20 ENCOUNTER — Inpatient Hospital Stay: Payer: PPO | Attending: Oncology | Admitting: Oncology

## 2017-04-20 ENCOUNTER — Ambulatory Visit
Admission: RE | Admit: 2017-04-20 | Discharge: 2017-04-20 | Disposition: A | Discharge status: Home / Self Care | Payer: PPO | Source: Ambulatory Visit | Attending: Oncology | Admitting: Oncology

## 2017-04-20 ENCOUNTER — Encounter: Payer: Self-pay | Admitting: Oncology

## 2017-04-20 DIAGNOSIS — Z122 Encounter for screening for malignant neoplasm of respiratory organs: Secondary | ICD-10-CM | POA: Diagnosis not present

## 2017-04-20 DIAGNOSIS — Z87891 Personal history of nicotine dependence: Secondary | ICD-10-CM | POA: Insufficient documentation

## 2017-04-20 DIAGNOSIS — I2510 Atherosclerotic heart disease of native coronary artery without angina pectoris: Secondary | ICD-10-CM | POA: Insufficient documentation

## 2017-04-20 DIAGNOSIS — J432 Centrilobular emphysema: Secondary | ICD-10-CM | POA: Insufficient documentation

## 2017-04-20 DIAGNOSIS — I700 Atherosclerosis of aorta: Secondary | ICD-10-CM | POA: Diagnosis not present

## 2017-04-20 DIAGNOSIS — J438 Other emphysema: Secondary | ICD-10-CM | POA: Insufficient documentation

## 2017-04-20 NOTE — Progress Notes (Signed)
In accordance with CMS guidelines, patient has met eligibility criteria including age, absence of signs or symptoms of lung cancer.  Social History  Substance Use Topics  . Smoking status: Former Smoker    Packs/day: 0.75    Years: 40.00    Types: Cigarettes    Quit date: 03/23/2016  . Smokeless tobacco: Never Used  . Alcohol use 0.0 oz/week     Comment: social     A shared decision-making session was conducted prior to the performance of CT scan. This includes one or more decision aids, includes benefits and harms of screening, follow-up diagnostic testing, over-diagnosis, false positive rate, and total radiation exposure.  Counseling on the importance of adherence to annual lung cancer LDCT screening, impact of co-morbidities, and ability or willingness to undergo diagnosis and treatment is imperative for compliance of the program.  Counseling on the importance of continued smoking cessation for former smokers; the importance of smoking cessation for current smokers, and information about tobacco cessation interventions have been given to patient including [place] and [facility] quit [place] programs.  Written order for lung cancer screening with LDCT has been given to the patient and any and all questions have been answered to the best of my abilities.   Yearly follow up will be coordinated by Burgess Estelle, Thoracic Navigator.  Faythe Casa, NP 04/20/2017 11:10 AM

## 2017-04-23 ENCOUNTER — Encounter: Payer: Self-pay | Admitting: *Deleted

## 2017-04-23 ENCOUNTER — Encounter: Payer: Self-pay | Admitting: Family Medicine

## 2017-04-23 DIAGNOSIS — I2510 Atherosclerotic heart disease of native coronary artery without angina pectoris: Secondary | ICD-10-CM | POA: Insufficient documentation

## 2017-04-23 DIAGNOSIS — I2584 Coronary atherosclerosis due to calcified coronary lesion: Secondary | ICD-10-CM

## 2017-04-23 DIAGNOSIS — I700 Atherosclerosis of aorta: Secondary | ICD-10-CM | POA: Insufficient documentation

## 2017-04-23 DIAGNOSIS — J42 Unspecified chronic bronchitis: Secondary | ICD-10-CM | POA: Insufficient documentation

## 2017-05-07 ENCOUNTER — Encounter: Payer: Self-pay | Admitting: Family Medicine

## 2017-05-07 ENCOUNTER — Ambulatory Visit (INDEPENDENT_AMBULATORY_CARE_PROVIDER_SITE_OTHER): Payer: PPO | Admitting: Family Medicine

## 2017-05-07 VITALS — BP 110/60 | HR 102 | Resp 14 | Ht 69.0 in | Wt 186.0 lb

## 2017-05-07 DIAGNOSIS — I10 Essential (primary) hypertension: Secondary | ICD-10-CM | POA: Diagnosis not present

## 2017-05-07 DIAGNOSIS — J432 Centrilobular emphysema: Secondary | ICD-10-CM

## 2017-05-07 DIAGNOSIS — J410 Simple chronic bronchitis: Secondary | ICD-10-CM | POA: Diagnosis not present

## 2017-05-07 DIAGNOSIS — E559 Vitamin D deficiency, unspecified: Secondary | ICD-10-CM | POA: Diagnosis not present

## 2017-05-07 DIAGNOSIS — Z23 Encounter for immunization: Secondary | ICD-10-CM | POA: Diagnosis not present

## 2017-05-07 DIAGNOSIS — I2510 Atherosclerotic heart disease of native coronary artery without angina pectoris: Secondary | ICD-10-CM

## 2017-05-07 DIAGNOSIS — I700 Atherosclerosis of aorta: Secondary | ICD-10-CM | POA: Diagnosis not present

## 2017-05-07 MED ORDER — ATORVASTATIN CALCIUM 40 MG PO TABS: 40.0000 mg | ORAL_TABLET | Freq: Every day | ORAL | 1 refills | Status: DC

## 2017-05-07 MED ORDER — LISINOPRIL 20 MG PO TABS: 20.0000 mg | ORAL_TABLET | Freq: Every day | ORAL | 1 refills | Status: DC

## 2017-05-07 NOTE — Progress Notes (Signed)
Name: Ernest Sweeney   MRN: [id number]    DOB: 1951/07/10   Date:05/07/2017       Progress Note  Subjective  Chief Complaint  Chief Complaint  Patient presents with  . Hypertension  . Hyperlipidemia    HPI  HTN: he is doing well, bp is at goal, no dizziness, chest pain or palpitation. Feeling well on medication  Hyperlipidemia: taking Lovastatin, no side effect: Denies myalgias or SOB. Last labs were done 01/2017 and reviewed with patient , we will change to Atorvastatin because of atherosclerosis aorta and coronary found on lung CT scan  Prostate Cancer : s/p robotic  prostatectomy done by Dr. Pilar Jarvis on 07/15/2016. Gleason 3+4 . Having ED but controlled with medication   Hyperglycemia: glucose 101 back 07/2016, he states occasionally has polydipsia, no polyphagia or polyuria Normal hgbA1C 01/2017  Chronic bronchitis : he quit smoking one year ago, he still has occasional cough, but denies SOB or wheezing. CT chest showed centrilobular emphysema 04/2017  Coronary artery disease: we will change statin therapy, refer to cardiologist for further testing  Aorta atherosclerosis: needs high dose statin therapy and continue aspirin   Patient Active Problem List   Diagnosis Date Noted  . Chronic bronchitis ([place]) 04/23/2017  . Atherosclerosis of aorta ([place]) 04/23/2017  . Coronary artery disease due to calcified coronary lesion 04/23/2017  . Glaucoma, left eye 08/04/2016  . Prostate cancer ([place]) 07/15/2016  . History of prostatectomy 07/15/2016  . Vitamin D deficiency 05/31/2015  . Hyperlipidemia LDL goal <100 05/31/2015  . Seasonal allergic rhinitis 05/31/2015  . Chronic pain of both shoulders 05/31/2015  . Family history of malignant neoplasm of prostate 07/26/2012  . Hematuria, microscopic 07/26/2012    Past Surgical History:  Procedure Laterality Date  . COLONOSCOPY  2010   Dr Sula Rumple  . excision skin cyst  06-19-14   Dr. Festus Aloe CYST  . PELVIC LYMPH NODE  DISSECTION N/A 07/15/2016   Procedure: PELVIC LYMPH NODE DISSECTION;  Surgeon: Nickie Retort, MD;  Location: [facility] ORS;  Service: Urology;  Laterality: N/A;  . ROBOT ASSISTED LAPAROSCOPIC RADICAL PROSTATECTOMY N/A 07/15/2016   Procedure: ROBOTIC ASSISTED LAPAROSCOPIC RADICAL PROSTATECTOMY;  Surgeon: Nickie Retort, MD;  Location: [facility] ORS;  Service: Urology;  Laterality: N/A;    Family History  Problem Relation Age of Onset  . Heart disease Mother   . Diabetes Father   . Cancer Sister        Breast  . Cancer Brother        Prostate    Social History   Social History  . Marital status: Widowed    Spouse name: N/A  . Number of children: N/A  . Years of education: N/A   Occupational History  . Not on file.   Social History Main Topics  . Smoking status: Former Smoker    Packs/day: 0.75    Years: 40.00    Types: Cigarettes    Quit date: 03/23/2016  . Smokeless tobacco: Never Used  . Alcohol use 0.0 oz/week     Comment: social  . Drug use: No  . Sexual activity: Not Currently    Birth control/ protection: Condom   Other Topics Concern  . Not on file   Social History Narrative  . No narrative on file     Current Outpatient Prescriptions:  .  aspirin 81 MG tablet, Take 81 mg by mouth daily., Disp: , Rfl:  .  Cholecalciferol (VITAMIN D) 2000 units CAPS, Take 1 capsule (  2,000 Units total) by mouth daily., Disp: 30 capsule, Rfl: 0 .  latanoprost (XALATAN) 0.005 % ophthalmic solution, Place 1 drop into the left eye at bedtime., Disp: , Rfl:  .  lisinopril (PRINIVIL,ZESTRIL) 20 MG tablet, Take 1 tablet (20 mg total) by mouth daily., Disp: 90 tablet, Rfl: 1 .  lovastatin (MEVACOR) 20 MG tablet, Take 1 tablet (20 mg total) by mouth at bedtime., Disp: 90 tablet, Rfl: 1 .  sildenafil (REVATIO) 20 MG tablet, Take 1 to 5 tablets PO daily prn, Disp: 30 tablet, Rfl: 3 .  traZODone (DESYREL) 50 MG tablet, Take 0.5-1 tablets (25-50 mg total) by mouth at bedtime as needed for  sleep., Disp: 30 tablet, Rfl: 0 .  triamcinolone cream (KENALOG) 0.1 %, Apply 1 application topically 2 (two) times daily., Disp: 45 g, Rfl: 0  No Known Allergies   ROS  Constitutional: Negative for fever or weight change.  Respiratory: Positive  for mild cough but no shortness of breath.   Cardiovascular: Negative for chest pain or palpitations.  Gastrointestinal: Negative for abdominal pain, no bowel changes.  Musculoskeletal: Negative for gait problem or joint swelling.  Skin: Negative for rash.  Neurological: Negative for dizziness or headache.  No other specific complaints in a complete review of systems (except as listed in HPI above).  Objective  Vitals:   05/07/17 0925  BP: 110/60  Pulse: (!) 102  Resp: 14  SpO2: 98%  Weight: 186 lb (84.4 kg)  Height: 5\' 9"  (1.753 m)    Body mass index is 27.47 kg/m.  Physical Exam  Constitutional: Patient appears well-developed and well-nourished. Muscular No distress.  HEENT: head atraumatic, normocephalic, pupils equal and reactive to light, neck supple, throat within normal limits Cardiovascular: Normal rate, regular rhythm and normal heart sounds.  No murmur heard. No BLE edema. Pulmonary/Chest: Effort normal and breath sounds normal. No respiratory distress. Abdominal: Soft.  There is no tenderness. Psychiatric: Patient has a normal mood and affect. behavior is normal. Judgment and thought content normal.  PHQ2/9: Depression screen [facility] 2/9 02/05/2017 08/04/2016 02/05/2016 10/03/2015  Decreased Interest 0 1 0 1  Down, Depressed, Hopeless 0 2 0 1  PHQ - 2 Score 0 3 0 2  Altered sleeping - 0 - 1  Tired, decreased energy - 0 - 1  Change in appetite - 0 - 1  Feeling bad or failure about yourself  - 0 - 1  Trouble concentrating - 0 - 1  Moving slowly or fidgety/restless - 0 - 0  Suicidal thoughts - 0 - 0  PHQ-9 Score - 3 - 7  Difficult doing work/chores - Somewhat difficult - Not difficult at all     Fall Risk: Fall Risk   05/07/2017 02/05/2017 08/04/2016 02/05/2016 10/03/2015  Falls in the past year? No No No No No     Functional Status Survey: Is the patient deaf or have difficulty hearing?: No Does the patient have difficulty seeing, even when wearing glasses/contacts?: No Does the patient have difficulty concentrating, remembering, or making decisions?: No Does the patient have difficulty walking or climbing stairs?: No Does the patient have difficulty dressing or bathing?: No Does the patient have difficulty doing errands alone such as visiting a doctor's office or shopping?: No   Assessment & Plan  1. Essential hypertension  At goal, no side effects - lisinopril (PRINIVIL,ZESTRIL) 20 MG tablet; Take 1 tablet (20 mg total) by mouth daily.  Dispense: 90 tablet; Refill: 1  2. Flu vaccine need  -  Flu vaccine HIGH DOSE PF  3. Simple chronic bronchitis (HCC)  - Spirometry: Pre   4. Atherosclerosis of aorta (HCC)  Continue statin and aspirin daily   5. Vitamin D deficiency  Continue supplementation   6. Atherosclerosis of native coronary artery of native heart without angina pectoris  - atorvastatin (LIPITOR) 40 MG tablet; Take 1 tablet (40 mg total) by mouth daily. New cholesterol medication  Dispense: 90 tablet; Refill: 1 - Ambulatory referral to Cardiology  7. Centrilobular emphysema ([place])  Discussed CT results with patient

## 2017-05-21 DIAGNOSIS — H401122 Primary open-angle glaucoma, left eye, moderate stage: Secondary | ICD-10-CM | POA: Diagnosis not present

## 2017-07-23 ENCOUNTER — Other Ambulatory Visit: Payer: Medicare HMO

## 2017-07-23 DIAGNOSIS — C61 Malignant neoplasm of prostate: Secondary | ICD-10-CM | POA: Diagnosis not present

## 2017-07-24 LAB — PSA: Prostate Specific Ag, Serum: <0.1 ng/mL (ref 0.0–4.0)

## 2017-07-29 ENCOUNTER — Ambulatory Visit: Payer: PPO | Admitting: Cardiovascular Disease

## 2017-07-29 ENCOUNTER — Encounter: Payer: Self-pay | Admitting: Urology

## 2017-07-29 ENCOUNTER — Ambulatory Visit: Payer: Medicare HMO | Admitting: Urology

## 2017-07-29 VITALS — BP 115/79 | HR 96 | Ht 69.0 in | Wt 184.8 lb

## 2017-07-29 DIAGNOSIS — C61 Malignant neoplasm of prostate: Secondary | ICD-10-CM

## 2017-07-29 DIAGNOSIS — N529 Male erectile dysfunction, unspecified: Secondary | ICD-10-CM | POA: Diagnosis not present

## 2017-07-29 NOTE — Progress Notes (Signed)
07/29/2017 9:14 AM   Ernest Sweeney 1950/09/07 [id number]  Referring provider: [name], MD [address] [place], [place] [address]  Chief Complaint  Patient presents with  . Prostate Cancer    HPI: The patient is a 67 year old gentleman presents today after undergoing a robotic prostatectomy with pelvic lymph node dissection on July 15, 2016. PSA remains undetectable in January 201p.   At this time, he is completely continent. He does not wear a pad during the day. Very rarely he will leak when he coughs or sneezes. He does wear protective pad at night that is mostly dry in the morning.   In regards to erections, the patient has not had one. He feels the sensation of starting to get one but is unable to obtain one. He did have them preoperatively. He was previously prescribed sildenafil.  He is only tried 3 tablets without much success.  He has not tried the full 5 tablet dose.  He is not very concerned currently by his erections.  Pathology: [id number] Gleason 3+4 prostae cancer  Focal extraprostatic extension +perineural invasion Margins negative Blt pelvic LN negative Pretreatment PSA: 5.99        PMH: Past Medical History:  Diagnosis Date  . Anxiety    Panic attacks in the past  . Arthritis   . Benign prostatic hypertrophy without lower urinary tract symptoms   . Cancer [facility])    Prostate  . Colon polyp   . GERD (gastroesophageal reflux disease)   . Glaucoma (increased eye pressure)    Left eye only  . Hyperlipidemia   . Hypertension     Surgical History: Past Surgical History:  Procedure Laterality Date  . COLONOSCOPY  2010   Dr Sula Rumple  . excision skin cyst  06-19-14   Dr. Festus Aloe CYST  . PELVIC LYMPH NODE DISSECTION N/A 07/15/2016   Procedure: PELVIC LYMPH NODE DISSECTION;  Surgeon: Nickie Retort, MD;  Location: [facility] ORS;  Service: Urology;  Laterality: N/A;  . ROBOT ASSISTED LAPAROSCOPIC RADICAL PROSTATECTOMY N/A  07/15/2016   Procedure: ROBOTIC ASSISTED LAPAROSCOPIC RADICAL PROSTATECTOMY;  Surgeon: Nickie Retort, MD;  Location: [facility] ORS;  Service: Urology;  Laterality: N/A;    Home Medications:  Allergies as of 07/29/2017   No Known Allergies     Medication List        Accurate as of 07/29/17  9:14 AM. Always use your most recent med list.          aspirin 81 MG tablet Take 81 mg by mouth daily.   atorvastatin 40 MG tablet Commonly known as:  LIPITOR Take 1 tablet (40 mg total) by mouth daily. New cholesterol medication   latanoprost 0.005 % ophthalmic solution Commonly known as:  XALATAN Place 1 drop into the left eye at bedtime.   lisinopril 20 MG tablet Commonly known as:  PRINIVIL,ZESTRIL Take 1 tablet (20 mg total) by mouth daily.   sildenafil 20 MG tablet Commonly known as:  REVATIO Take 1 to 5 tablets PO daily prn   traZODone 50 MG tablet Commonly known as:  DESYREL Take 0.5-1 tablets (25-50 mg total) by mouth at bedtime as needed for sleep.   triamcinolone cream 0.1 % Commonly known as:  KENALOG Apply 1 application topically 2 (two) times daily.   Vitamin D 2000 units Caps Take 1 capsule (2,000 Units total) by mouth daily.       Allergies: No Known Allergies  Family History: Family History  Problem Relation Age  of Onset  . Heart disease Mother   . Diabetes Father   . Cancer Sister        Breast  . Cancer Brother        Prostate    Social History:  reports that he quit smoking about 16 months ago. His smoking use included cigarettes. He has a 30.00 pack-year smoking history. he has never used smokeless tobacco. He reports that he drinks alcohol. He reports that he does not use drugs.  ROS: UROLOGY Frequent Urination?: No Hard to postpone urination?: No Burning/pain with urination?: No Get up at night to urinate?: No Leakage of urine?: Yes Urine stream starts and stops?: No Trouble starting stream?: No Do you have to strain to urinate?:  No Blood in urine?: No Urinary tract infection?: No Sexually transmitted disease?: No Injury to kidneys or bladder?: No Painful intercourse?: No Weak stream?: No Erection problems?: No Penile pain?: No  Gastrointestinal Nausea?: No Vomiting?: No Indigestion/heartburn?: No Diarrhea?: No Constipation?: No  Constitutional Fever: No Night sweats?: No Weight loss?: No Fatigue?: No  Skin Skin rash/lesions?: No Itching?: No  Eyes Blurred vision?: No Double vision?: No  Ears/Nose/Throat Sore throat?: No Sinus problems?: No  Hematologic/Lymphatic Swollen glands?: No Easy bruising?: No  Cardiovascular Leg swelling?: No Chest pain?: No  Respiratory Cough?: No Shortness of breath?: No  Endocrine Excessive thirst?: No  Musculoskeletal Back pain?: No Joint pain?: No  Neurological Headaches?: No Dizziness?: No  Psychologic Depression?: No Anxiety?: No  Physical Exam: BP 115/79 (BP Location: Right Arm, Patient Position: Sitting, Cuff Size: Normal)   Pulse 96   Ht 5\' 9"  (1.753 m)   Wt 184 lb 12.8 oz (83.8 kg)   BMI 27.29 kg/m   Constitutional:  Alert and oriented, No acute distress. HEENT: [place] AT, moist mucus membranes.  Trachea midline, no masses. Cardiovascular: No clubbing, cyanosis, or edema. Respiratory: Normal respiratory effort, no increased work of breathing. GI: Abdomen is soft, nontender, nondistended, no abdominal masses GU: No CVA tenderness.  Skin: No rashes, bruises or suspicious lesions. Lymph: No cervical or inguinal adenopathy. Neurologic: Grossly intact, no focal deficits, moving all 4 extremities. Psychiatric: Normal mood and affect.  Laboratory Data: Lab Results  Component Value Date   WBC 3.1 (L) 01/28/2017   HGB 16.0 01/28/2017   HCT 47.3 01/28/2017   MCV 96.9 01/28/2017   PLT 318 01/28/2017    Lab Results  Component Value Date   CREATININE 1.23 01/28/2017    Lab Results  Component Value Date   PSA 5.99 (H)  02/05/2016   PSA 2.6 08/24/2014    No results found for: TESTOSTERONE  Lab Results  Component Value Date   HGBA1C 5.1 01/28/2017    Urinalysis    Component Value Date/Time   APPEARANCEUR Hazy (A) 05/28/2016 1200   GLUCOSEU Negative 05/28/2016 1200   BILIRUBINUR Negative 05/28/2016 1200   PROTEINUR Negative 05/28/2016 1200   NITRITE Negative 05/28/2016 1200   LEUKOCYTESUR Trace (A) 05/28/2016 1200    Assessment & Plan:    1. pT3aN0Mx prostate cancer -No evidence of disease.  Repeat PSA in 6 months.  2. ED Encourage the patient to try up to 5 pills of generic sildenafil.  We did discuss vacuum erection devices as well as penile injections which is not currently interested in.  We will readdress at follow-up.  Return in about 6 months (around 01/26/2018) for PSA prior.  Nickie Retort, MD  [facility] [address] [place], [place]  [address] [phone]  

## 2017-08-26 ENCOUNTER — Ambulatory Visit (INDEPENDENT_AMBULATORY_CARE_PROVIDER_SITE_OTHER): Payer: Medicare HMO | Admitting: Cardiovascular Disease

## 2017-08-26 ENCOUNTER — Encounter: Payer: Self-pay | Admitting: Cardiovascular Disease

## 2017-08-26 VITALS — BP 100/60 | HR 100 | Ht 69.0 in | Wt 185.5 lb

## 2017-08-26 DIAGNOSIS — I2510 Atherosclerotic heart disease of native coronary artery without angina pectoris: Secondary | ICD-10-CM

## 2017-08-26 DIAGNOSIS — R079 Chest pain, unspecified: Secondary | ICD-10-CM

## 2017-08-26 DIAGNOSIS — I10 Essential (primary) hypertension: Secondary | ICD-10-CM

## 2017-08-26 NOTE — Progress Notes (Signed)
Cardiology Office Note   Date:  08/26/2017   ID:  Ernest Sweeney, DOB 06-Sep-1950, MRN [id number]  PCP:  [name], MD  Cardiologist:   Kathlyn Sacramento, MD   Chief Complaint  Patient presents with  . OTHER    Per Dr. Ancil Boozer Atherosclerosis of native coronary artery  c/o mild chest pain. Meds reviewed verbally with pt.      History of Present Illness: Ernest Sweeney is a 67 y.o. male who was referred by Dr. Ancil Boozer for evaluation of coronary atherosclerosis noted on CT scan. He has chronic medical conditions that include essential hypertension, hyperlipidemia, previous tobacco use and prostate cancer status post prostatectomy in January 2018. He had CT chest for lung cancer screening in October 2018 which showed evidence of aortic atherosclerosis as well as calcified plaque in the LAD and left circumflex.  The patient was started on atorvastatin due to that. He had an abdominal aortic ultrasound last year that showed no evidence of aortic aneurysm.  He reports intermittent episodes of substernal chest pain described as aching sensation lasting for a few minutes.  This usually happens at rest and not with physical activities.  No shortness of breath. He quit smoking about 2 years ago. He reports family history of coronary artery disease and congestive heart failure but does not know the exact details.  Past Medical History:  Diagnosis Date  . Anxiety    Panic attacks in the past  . Arthritis   . Benign prostatic hypertrophy without lower urinary tract symptoms   . Cancer [facility])    Prostate  . Colon polyp   . GERD (gastroesophageal reflux disease)   . Glaucoma (increased eye pressure)    Left eye only  . Hyperlipidemia   . Hypertension     Past Surgical History:  Procedure Laterality Date  . COLONOSCOPY  2010   Dr Sula Rumple  . excision skin cyst  06-19-14   Dr. Festus Aloe CYST  . PELVIC LYMPH NODE DISSECTION N/A 07/15/2016   Procedure: PELVIC LYMPH NODE  DISSECTION;  Surgeon: Nickie Retort, MD;  Location: [facility] ORS;  Service: Urology;  Laterality: N/A;  . ROBOT ASSISTED LAPAROSCOPIC RADICAL PROSTATECTOMY N/A 07/15/2016   Procedure: ROBOTIC ASSISTED LAPAROSCOPIC RADICAL PROSTATECTOMY;  Surgeon: Nickie Retort, MD;  Location: [facility] ORS;  Service: Urology;  Laterality: N/A;     Current Outpatient Medications  Medication Sig Dispense Refill  . aspirin 81 MG tablet Take 81 mg by mouth daily.    Marland Kitchen atorvastatin (LIPITOR) 40 MG tablet Take 1 tablet (40 mg total) by mouth daily. New cholesterol medication 90 tablet 1  . Cholecalciferol (VITAMIN D) 2000 units CAPS Take 1 capsule (2,000 Units total) by mouth daily. 30 capsule 0  . latanoprost (XALATAN) 0.005 % ophthalmic solution Place 1 drop into the left eye at bedtime.    Marland Kitchen lisinopril (PRINIVIL,ZESTRIL) 20 MG tablet Take 1 tablet (20 mg total) by mouth daily. 90 tablet 1  . sildenafil (REVATIO) 20 MG tablet Take 1 to 5 tablets PO daily prn 30 tablet 3  . traZODone (DESYREL) 50 MG tablet Take 0.5-1 tablets (25-50 mg total) by mouth at bedtime as needed for sleep. 30 tablet 0  . triamcinolone cream (KENALOG) 0.1 % Apply 1 application topically 2 (two) times daily. 45 g 0   No current facility-administered medications for this visit.     Allergies:   Patient has no known allergies.    Social History:  The patient  reports that  he quit smoking about 17 months ago. His smoking use included cigarettes. He has a 30.00 pack-year smoking history. he has never used smokeless tobacco. He reports that he drinks alcohol. He reports that he does not use drugs.   Family History:  The patient's family history includes Cancer in his brother and sister; Diabetes in his father; Heart attack in his father and mother; Heart disease in his mother.    ROS:  Please see the history of present illness.   Otherwise, review of systems are positive for none.   All other systems are reviewed and negative.    PHYSICAL  EXAM: VS:  Ht 5\' 9"  (1.753 m)   Wt 185 lb 8 oz (84.1 kg)   BMI 27.39 kg/m  , BMI Body mass index is 27.39 kg/m. GEN: Well nourished, well developed, in no acute distress  HEENT: normal  Neck: no JVD, carotid bruits, or masses Cardiac: RRR; no murmurs, rubs, or gallops,no edema  Respiratory:  clear to auscultation bilaterally, normal work of breathing GI: soft, nontender, nondistended, + BS MS: no deformity or atrophy  Skin: warm and dry, no rash Neuro:  Strength and sensation are intact Psych: euthymic mood, full affect   EKG:  EKG is ordered today. The ekg ordered today demonstrates normal sinus rhythm with no significant ST or T wave changes.   Recent Labs: 01/28/2017: ALT 15; BUN 13; Creat 1.23; Hemoglobin 16.0; Platelets 318; Potassium 4.8; Sodium 139; TSH 0.89    Lipid Panel    Component Value Date/Time   CHOL 199 01/28/2017 1134   CHOL 176 10/18/2015 0914   TRIG 78 01/28/2017 1134   HDL 83 01/28/2017 1134   HDL 78 10/18/2015 0914   CHOLHDL 2.4 01/28/2017 1134   VLDL 16 01/28/2017 1134   LDLCALC 100 (H) 01/28/2017 1134   LDLCALC 88 10/18/2015 0914      Wt Readings from Last 3 Encounters:  08/26/17 185 lb 8 oz (84.1 kg)  07/29/17 184 lb 12.8 oz (83.8 kg)  05/07/17 186 lb (84.4 kg)      PAD Screen 08/26/2017  Previous PAD dx? No  Previous surgical procedure? No  Pain with walking? No  Feet/toe relief with dangling? No  Painful, non-healing ulcers? No  Extremities discolored? No      ASSESSMENT AND PLAN:  1.  Atypical chest pain: Symptoms are overall atypical but he does have multiple risk factors for coronary artery disease.  Also recent CT of the lungs showed evidence of atherosclerosis in the LAD and left circumflex distribution.  Due to that, I recommend evaluation with a treadmill nuclear stress test. I discussed with the patient the importance of lifestyle changes in order to decrease the chance of future coronary artery disease and cardiovascular  events. We discussed the importance of controlling risk factors, healthy diet as well as regular exercise.  2.  Coronary atherosclerosis noted on CT scan: I agree with low-dose aspirin and starting atorvastatin with a target LDL of less than 70.  3.  Essential hypertension: Blood pressure is controlled on current medications.    Disposition:   FU with me as needed.   Signed,  Kathlyn Sacramento, MD  08/26/2017 11:35 AM    [place]

## 2017-08-26 NOTE — Patient Instructions (Addendum)
Medication Instructions:  Your physician recommends that you continue on your current medications as directed. Please refer to the Current Medication list given to you today.   Labwork: none  Testing/Procedures: Your physician has requested that you have a lexiscan myoview. For further information please visit [email or web address]. Please follow instruction sheet, as given.  [place]  Your caregiver has ordered a Stress Test with nuclear imaging. The purpose of this test is to evaluate the blood supply to your heart muscle. This procedure is referred to as a "Non-Invasive Stress Test." This is because other than having an IV started in your vein, nothing is inserted or "invades" your body. Cardiac stress tests are done to find areas of poor blood flow to the heart by determining the extent of coronary artery disease (CAD). Some patients exercise on a treadmill, which naturally increases the blood flow to your heart, while others who are  unable to walk on a treadmill due to physical limitations have a pharmacologic/chemical stress agent called Lexiscan . This medicine will mimic walking on a treadmill by temporarily increasing your coronary blood flow.   Please note: these test may take anywhere between 2-4 hours to complete  PLEASE REPORT TO [place] AT THE FIRST DESK WILL DIRECT YOU WHERE TO GO  Date of Procedure: Monday, February 18  Arrival Time for Procedure:  7:15am Instructions regarding medication:   You may take your morning medications with a sip of water.   PLEASE NOTIFY THE OFFICE AT LEAST 27 HOURS IN ADVANCE IF YOU ARE UNABLE TO KEEP YOUR APPOINTMENT.  [phone] AND  PLEASE NOTIFY NUCLEAR MEDICINE AT [facility] AT LEAST 24 HOURS IN ADVANCE IF YOU ARE UNABLE TO KEEP YOUR APPOINTMENT. [phone]  How to prepare for your Myoview test:  1. Do not eat or drink after midnight 2. No caffeine for 24 hours prior to test 3. No smoking 24 hours  prior to test. 4. Your medication may be taken with water.  If your doctor stopped a medication because of this test, do not take that medication. 5. Ladies, please do not wear dresses.  Skirts or pants are appropriate. Please wear a short sleeve shirt. 6. No perfume, cologne or lotion. 7. Wear comfortable walking shoes. No heels!            Follow-Up: Your physician recommends that you schedule a follow-up appointment as needed.    Any Other Special Instructions Will Be Listed Below (If Applicable).     If you need a refill on your cardiac medications before your next appointment, please call your pharmacy.  Cardiac Nuclear Scan A cardiac nuclear scan is a test that measures blood flow to the heart when a person is resting and when he or she is exercising. The test looks for problems such as:  Not enough blood reaching a portion of the heart.  The heart muscle not working normally.  You may need this test if:  You have heart disease.  You have had abnormal lab results.  You have had heart surgery or angioplasty.  You have chest pain.  You have shortness of breath.  In this test, a radioactive dye (tracer) is injected into your bloodstream. After the tracer has traveled to your heart, an imaging device is used to measure how much of the tracer is absorbed by or distributed to various areas of your heart. This procedure is usually done at a hospital and takes 2-4 hours. Tell a health care provider  about:  Any allergies you have.  All medicines you are taking, including vitamins, herbs, eye drops, creams, and over-the-counter medicines.  Any problems you or family members have had with the use of anesthetic medicines.  Any blood disorders you have.  Any surgeries you have had.  Any medical conditions you have.  Whether you are pregnant or may be pregnant. What are the risks? Generally, this is a safe procedure. However, problems may occur,  including:  Serious chest pain and heart attack. This is only a risk if the stress portion of the test is done.  Rapid heartbeat.  Sensation of warmth in your chest. This usually passes quickly.  What happens before the procedure?  Ask your health care provider about changing or stopping your regular medicines. This is especially important if you are taking diabetes medicines or blood thinners.  Remove your jewelry on the day of the procedure. What happens during the procedure?  An IV tube will be inserted into one of your veins.  Your health care provider will inject a small amount of radioactive tracer through the tube.  You will wait for 20-40 minutes while the tracer travels through your bloodstream.  Your heart activity will be monitored with an electrocardiogram (ECG).  You will lie down on an exam table.  Images of your heart will be taken for about 15-20 minutes.  You may be asked to exercise on a treadmill or stationary bike. While you exercise, your heart's activity will be monitored with an ECG, and your blood pressure will be checked. If you are unable to exercise, you may be given a medicine to increase blood flow to parts of your heart.  When blood flow to your heart has peaked, a tracer will again be injected through the IV tube.  After 20-40 minutes, you will get back on the exam table and have more images taken of your heart.  When the procedure is over, your IV tube will be removed. The procedure may vary among health care providers and hospitals. Depending on the type of tracer used, scans may need to be repeated 3-4 hours later. What happens after the procedure?  Unless your health care provider tells you otherwise, you may return to your normal schedule, including diet, activities, and medicines.  Unless your health care provider tells you otherwise, you may increase your fluid intake. This will help flush the contrast dye from your body. Drink enough fluid  to keep your urine clear or pale yellow.  It is up to you to get your test results. Ask your health care provider, or the department that is doing the test, when your results will be ready. Summary  A cardiac nuclear scan measures the blood flow to the heart when a person is resting and when he or she is exercising.  You may need this test if you are at risk for heart disease.  Tell your health care provider if you are pregnant.  Unless your health care provider tells you otherwise, increase your fluid intake. This will help flush the contrast dye from your body. Drink enough fluid to keep your urine clear or pale yellow. This information is not intended to replace advice given to you by your health care provider. Make sure you discuss any questions you have with your health care provider. Document Released: 07/24/2004 Document Revised: 07/01/2016 Document Reviewed: 06/07/2013 Elsevier Interactive Patient Education  2017 [employer].

## 2017-08-30 ENCOUNTER — Encounter
Admission: RE | Admit: 2017-08-30 | Discharge: 2017-08-30 | Disposition: A | Discharge status: Home / Self Care | Payer: Medicare HMO | Source: Ambulatory Visit | Attending: Cardiovascular Disease | Admitting: Cardiovascular Disease

## 2017-08-30 DIAGNOSIS — R079 Chest pain, unspecified: Secondary | ICD-10-CM | POA: Insufficient documentation

## 2017-08-30 LAB — NM MYOCAR MULTI W/SPECT W/WALL MOTION / EF
CHL CUP NUCLEAR SDS: 0
CHL CUP NUCLEAR SRS: 0
CHL CUP NUCLEAR SSS: 0
CSEPED: 4 min
CSEPHR: 88 %
Estimated workload: 4.6 METS
Exercise duration (sec): 0 s
LV sys vol: 19 mL
LVDIAVOL: 58 mL (ref 62–150)
Peak HR: 137 {beats}/min
Rest HR: 81 {beats}/min
TID: 1.09

## 2017-08-30 MED ORDER — TECHNETIUM TC 99M TETROFOSMIN IV KIT
32.7600 | PACK | Freq: Once | INTRAVENOUS | Status: AC | PRN
  Administered 2017-08-30: 32.76 via INTRAVENOUS

## 2017-08-30 MED ORDER — TECHNETIUM TC 99M TETROFOSMIN IV KIT
13.6400 | PACK | Freq: Once | INTRAVENOUS | Status: AC | PRN
  Administered 2017-08-30: 13.64 via INTRAVENOUS

## 2017-10-28 ENCOUNTER — Ambulatory Visit: Payer: PPO | Admitting: Family Medicine

## 2017-11-17 DIAGNOSIS — H401122 Primary open-angle glaucoma, left eye, moderate stage: Secondary | ICD-10-CM | POA: Diagnosis not present

## 2017-12-13 DIAGNOSIS — H401132 Primary open-angle glaucoma, bilateral, moderate stage: Secondary | ICD-10-CM | POA: Diagnosis not present

## 2017-12-22 ENCOUNTER — Other Ambulatory Visit: Payer: Self-pay | Admitting: Family Medicine

## 2017-12-22 DIAGNOSIS — I10 Essential (primary) hypertension: Secondary | ICD-10-CM

## 2017-12-22 NOTE — Telephone Encounter (Signed)
Hypertension medication request: Lisinopril  Last office visit pertaining to hypertension: 05/07/17  BP Readings from Last 3 Encounters:  08/26/17 100/60  07/29/17 115/79  05/07/17 110/60    Lab Results  Component Value Date   CREATININE 1.23 01/28/2017   BUN 13 01/28/2017   NA 139 01/28/2017   K 4.8 01/28/2017   CL 102 01/28/2017   CO2 21 01/28/2017    Follow up on 01/05/18

## 2018-01-05 ENCOUNTER — Ambulatory Visit (INDEPENDENT_AMBULATORY_CARE_PROVIDER_SITE_OTHER): Payer: Medicare HMO | Admitting: Family Medicine

## 2018-01-05 ENCOUNTER — Encounter: Payer: Self-pay | Admitting: Family Medicine

## 2018-01-05 VITALS — BP 118/74 | HR 103 | Temp 98.7°F | Resp 16 | Ht 69.0 in | Wt 180.3 lb

## 2018-01-05 DIAGNOSIS — I10 Essential (primary) hypertension: Secondary | ICD-10-CM | POA: Diagnosis not present

## 2018-01-05 DIAGNOSIS — E559 Vitamin D deficiency, unspecified: Secondary | ICD-10-CM

## 2018-01-05 DIAGNOSIS — C61 Malignant neoplasm of prostate: Secondary | ICD-10-CM

## 2018-01-05 DIAGNOSIS — J410 Simple chronic bronchitis: Secondary | ICD-10-CM

## 2018-01-05 DIAGNOSIS — I2510 Atherosclerotic heart disease of native coronary artery without angina pectoris: Secondary | ICD-10-CM | POA: Diagnosis not present

## 2018-01-05 DIAGNOSIS — D708 Other neutropenia: Secondary | ICD-10-CM | POA: Diagnosis not present

## 2018-01-05 DIAGNOSIS — I700 Atherosclerosis of aorta: Secondary | ICD-10-CM

## 2018-01-05 LAB — CBC WITH DIFFERENTIAL/PLATELET
Basophils Absolute: 19 cells/uL (ref 0–200)
Basophils Relative: 0.6 %
EOS ABS: 51 {cells}/uL (ref 15–500)
Eosinophils Relative: 1.6 %
HCT: 45.1 % (ref 38.5–50.0)
Hemoglobin: 15.3 g/dL (ref 13.2–17.1)
Lymphs Abs: 1158 cells/uL (ref 850–3900)
MCH: 32.5 pg (ref 27.0–33.0)
MCHC: 33.9 g/dL (ref 32.0–36.0)
MCV: 95.8 fL (ref 80.0–100.0)
MPV: 9.2 fL (ref 7.5–12.5)
Monocytes Relative: 13 %
NEUTROS PCT: 48.6 %
Neutro Abs: 1555 cells/uL (ref 1500–7800)
Platelets: 316 10*3/uL (ref 140–400)
RBC: 4.71 10*6/uL (ref 4.20–5.80)
RDW: 12 % (ref 11.0–15.0)
TOTAL LYMPHOCYTE: 36.2 %
WBC: 3.2 10*3/uL — ABNORMAL LOW (ref 3.8–10.8)
WBCMIX: 416 {cells}/uL (ref 200–950)

## 2018-01-05 LAB — COMPLETE METABOLIC PANEL WITH GFR
AG Ratio: 1.6 (calc) (ref 1.0–2.5)
ALT: 16 U/L (ref 9–46)
AST: 18 U/L (ref 10–35)
Albumin: 4.5 g/dL (ref 3.6–5.1)
Alkaline phosphatase (APISO): 64 U/L (ref 40–115)
BUN: 12 mg/dL (ref 7–25)
CO2: 29 mmol/L (ref 20–32)
Calcium: 9.8 mg/dL (ref 8.6–10.3)
Chloride: 101 mmol/L (ref 98–110)
Creat: 1.19 mg/dL (ref 0.70–1.25)
GFR, Est African American: 73 mL/min/{1.73_m2} (ref >=60)
GFR, Est Non African American: 63 mL/min/{1.73_m2} (ref >=60)
Globulin: 2.8 g/dL (calc) (ref 1.9–3.7)
Glucose, Bld: 91 mg/dL (ref 65–99)
Potassium: 4.7 mmol/L (ref 3.5–5.3)
Sodium: 136 mmol/L (ref 135–146)
Total Bilirubin: 1.2 mg/dL (ref 0.2–1.2)
Total Protein: 7.3 g/dL (ref 6.1–8.1)

## 2018-01-05 LAB — LIPID PANEL
Cholesterol: 167 mg/dL (ref <200)
HDL: 73 mg/dL (ref >40)
LDL Cholesterol (Calc): 80 mg/dL (calc)
Non-HDL Cholesterol (Calc): 94 mg/dL (calc) (ref <130)
Total CHOL/HDL Ratio: 2.3 (calc) (ref <5.0)
Triglycerides: 67 mg/dL (ref <150)

## 2018-01-05 MED ORDER — ATORVASTATIN CALCIUM 40 MG PO TABS: 40.0000 mg | ORAL_TABLET | Freq: Every day | ORAL | 1 refills | Status: DC

## 2018-01-05 MED ORDER — LISINOPRIL 20 MG PO TABS: 20.0000 mg | ORAL_TABLET | Freq: Every day | ORAL | 0 refills | Status: DC

## 2018-01-05 NOTE — Progress Notes (Signed)
Name: Ernest Sweeney   MRN: [id number]    DOB: 1950-08-16   Date:01/05/2018       Progress Note  Subjective  Chief Complaint  Chief Complaint  Patient presents with  . Medication Refill    6 month F/U  . Hypertension    Denies any symptoms  . Hyperlipidemia  . Prostate Cancer  . Coronary Artery Disease    HPI  HTN: he is doing well, bp is  Towards low end of normal, no dizziness, chest pain or palpitation. He is compliant with medication  Hyperlipidemia: taking Atorvastatin 40 mg , no myalgias , we will recheck labs.   Prostate Cancer : s/p robotic  prostatectomy done by Dr. Pilar Jarvis on 07/15/2016. Gleason 3+4 . Having ED but controlled with medication, last PSA normal <0.1   Hyperglycemia: glucose 101 back 07/2016, he states occasionally has polydipsia, no polyphagia or polyuria Normal hgbA1C 01/2017, we will check fasting glucose   Chronic bronchitis : he quit smoking 2017  he still has occasional cough, but denies SOB or wheezing. CT chest showed centrilobular emphysema 04/2017, he is feeling well and does not medication at this time  Coronary artery disease: on high dose statin and aspirin, negative stress test done by Dr. Fletcher Anon 08/2017    Aorta atherosclerosis: needs high dose statin therapy and continue aspirin. Unchanged    Patient Active Problem List   Diagnosis Date Noted  . Chronic bronchitis ([place]) 04/23/2017  . Atherosclerosis of aorta ([place]) 04/23/2017  . Coronary artery disease due to calcified coronary lesion 04/23/2017  . Glaucoma, left eye 08/04/2016  . Prostate cancer ([place]) 07/15/2016  . History of prostatectomy 07/15/2016  . Vitamin D deficiency 05/31/2015  . Hyperlipidemia LDL goal <100 05/31/2015  . Seasonal allergic rhinitis 05/31/2015  . Chronic pain of both shoulders 05/31/2015  . Family history of malignant neoplasm of prostate 07/26/2012  . Hematuria, microscopic 07/26/2012    Past Surgical History:  Procedure Laterality Date  .  COLONOSCOPY  2010   Dr Sula Rumple  . excision skin cyst  06-19-14   Dr. Festus Aloe CYST  . PELVIC LYMPH NODE DISSECTION N/A 07/15/2016   Procedure: PELVIC LYMPH NODE DISSECTION;  Surgeon: Nickie Retort, MD;  Location: [facility] ORS;  Service: Urology;  Laterality: N/A;  . ROBOT ASSISTED LAPAROSCOPIC RADICAL PROSTATECTOMY N/A 07/15/2016   Procedure: ROBOTIC ASSISTED LAPAROSCOPIC RADICAL PROSTATECTOMY;  Surgeon: Nickie Retort, MD;  Location: [facility] ORS;  Service: Urology;  Laterality: N/A;    Family History  Problem Relation Age of Onset  . Heart disease Mother   . Heart attack Mother   . Diabetes Father   . Heart attack Father   . Cancer Sister        Breast  . Cancer Brother        Prostate    Social History   Socioeconomic History  . Marital status: Widowed    Spouse name: Not on file  . Number of children: Not on file  . Years of education: Not on file  . Highest education level: Not on file  Occupational History  . Not on file  Social Needs  . Financial resource strain: Not on file  . Food insecurity:    Worry: Not on file    Inability: Not on file  . Transportation needs:    Medical: Not on file    Non-medical: Not on file  Tobacco Use  . Smoking status: Former Smoker    Packs/day: 0.75    Years:  40.00    Pack years: 30.00    Types: Cigarettes    Last attempt to quit: 03/23/2016    Years since quitting: 1.7  . Smokeless tobacco: Never Used  Substance and Sexual Activity  . Alcohol use: Yes    Alcohol/week: 0.0 oz    Comment: social  . Drug use: No  . Sexual activity: Not Currently    Birth control/protection: Condom  Lifestyle  . Physical activity:    Days per week: Not on file    Minutes per session: Not on file  . Stress: Not on file  Relationships  . Social connections:    Talks on phone: Not on file    Gets together: Not on file    Attends religious service: Not on file    Active member of club or organization: Not on file    Attends  meetings of clubs or organizations: Not on file    Relationship status: Not on file  . Intimate partner violence:    Fear of current or ex partner: Not on file    Emotionally abused: Not on file    Physically abused: Not on file    Forced sexual activity: Not on file  Other Topics Concern  . Not on file  Social History Narrative  . Not on file     Current Outpatient Medications:  .  aspirin 81 MG tablet, Take 81 mg by mouth daily., Disp: , Rfl:  .  atorvastatin (LIPITOR) 40 MG tablet, Take 1 tablet (40 mg total) by mouth daily. New cholesterol medication, Disp: 90 tablet, Rfl: 1 .  Cholecalciferol (VITAMIN D) 2000 units CAPS, Take 1 capsule (2,000 Units total) by mouth daily., Disp: 30 capsule, Rfl: 0 .  latanoprost (XALATAN) 0.005 % ophthalmic solution, Place 1 drop into both eyes at bedtime. , Disp: , Rfl:  .  lisinopril (PRINIVIL,ZESTRIL) 20 MG tablet, TAKE 1 TABLET BY MOUTH ONCE DAILY, Disp: 90 tablet, Rfl: 0 .  sildenafil (REVATIO) 20 MG tablet, Take 1 to 5 tablets PO daily prn, Disp: 30 tablet, Rfl: 3 .  traZODone (DESYREL) 50 MG tablet, Take 0.5-1 tablets (25-50 mg total) by mouth at bedtime as needed for sleep., Disp: 30 tablet, Rfl: 0 .  triamcinolone cream (KENALOG) 0.1 %, Apply 1 application topically 2 (two) times daily., Disp: 45 g, Rfl: 0  No Known Allergies   ROS  Constitutional: Negative for fever , positive for mild  weight change.  Respiratory: positive  for occasional cough but  shortness of breath.   Cardiovascular: Negative for chest pain or palpitations.  Gastrointestinal: Negative for abdominal pain, no bowel changes.  Musculoskeletal: Negative for gait problem or joint swelling.  Skin: Negative for rash.  Neurological: Negative for dizziness or headache.  No other specific complaints in a complete review of systems (except as listed in HPI above).  Objective  Vitals:   01/05/18 1150  BP: 118/74  Pulse: (!) 103  Resp: 16  Temp: 98.7 F (37.1 C)   TempSrc: Oral  SpO2: 97%  Weight: 180 lb 4.8 oz (81.8 kg)  Height: 5\' 9"  (1.753 m)    Body mass index is 26.63 kg/m.  Physical Exam  Constitutional: Patient appears well-developed and well-nourished.  No distress.  HEENT: head atraumatic, normocephalic, pupils equal and reactive to light, neck supple, throat within normal limits Cardiovascular: Normal rate, regular rhythm and normal heart sounds.  No murmur heard. No BLE edema. Pulmonary/Chest: Effort normal and breath sounds normal. No respiratory distress. Abdominal:  Soft.  There is no tenderness. Psychiatric: Patient has a normal mood and affect. behavior is normal. Judgment and thought content normal.  PHQ2/9: Depression screen [facility] 2/9 01/05/2018 02/05/2017 08/04/2016 02/05/2016 10/03/2015  Decreased Interest 0 0 1 0 1  Down, Depressed, Hopeless - 0 2 0 1  PHQ - 2 Score 0 0 3 0 2  Altered sleeping - - 0 - 1  Tired, decreased energy - - 0 - 1  Change in appetite - - 0 - 1  Feeling bad or failure about yourself  - - 0 - 1  Trouble concentrating - - 0 - 1  Moving slowly or fidgety/restless - - 0 - 0  Suicidal thoughts - - 0 - 0  PHQ-9 Score - - 3 - 7  Difficult doing work/chores - - Somewhat difficult - Not difficult at all     Fall Risk: Fall Risk  01/05/2018 05/07/2017 02/05/2017 08/04/2016 02/05/2016  Falls in the past year? No No No No No     Functional Status Survey: Is the patient deaf or have difficulty hearing?: No Does the patient have difficulty seeing, even when wearing glasses/contacts?: No Does the patient have difficulty concentrating, remembering, or making decisions?: No Does the patient have difficulty walking or climbing stairs?: No Does the patient have difficulty dressing or bathing?: No Does the patient have difficulty doing errands alone such as visiting a doctor's office or shopping?: No    Assessment & Plan  1. Atherosclerosis of aorta (HCC)  On CT chest, continue aspirin and atorvastatin  -  atorvastatin (LIPITOR) 40 MG tablet; Take 1 tablet (40 mg total) by mouth daily. New cholesterol medication  Dispense: 90 tablet; Refill: 1 - Lipid panel  2. Essential hypertension  - lisinopril (PRINIVIL,ZESTRIL) 20 MG tablet; Take 1 tablet (20 mg total) by mouth daily.  Dispense: 90 tablet; Refill: 0 - COMPLETE METABOLIC PANEL WITH GFR  3. Simple chronic bronchitis ([place])  Quit smoking in 2016   4. Vitamin D deficiency  Continue otc supplementation   5. Prostate cancer [facility])  Keep follow up with Urologist   6. Atherosclerosis of native coronary artery of native heart without angina pectoris  Had normal stress test, and is on medical management  - Lipid panel  7. Other neutropenia (HCC)  - CBC with Differential/Platelet

## 2018-01-25 ENCOUNTER — Other Ambulatory Visit: Payer: Medicare HMO

## 2018-01-25 DIAGNOSIS — C61 Malignant neoplasm of prostate: Secondary | ICD-10-CM

## 2018-01-26 ENCOUNTER — Telehealth: Payer: Self-pay

## 2018-01-26 DIAGNOSIS — C61 Malignant neoplasm of prostate: Secondary | ICD-10-CM

## 2018-01-26 LAB — PSA: Prostate Specific Ag, Serum: 0.2 ng/mL (ref 0.0–4.0)

## 2018-01-26 NOTE — Telephone Encounter (Signed)
Patient notified and lab apt made order placed

## 2018-01-26 NOTE — Telephone Encounter (Signed)
-----   Message from Hollice Espy, MD sent at 01/26/2018  1:54 PM EDT ----- PSA appears to be detectable although still quite low.  I think it is a good idea to have this repeated 1 day prior to his follow-up appointment which would likely be with Dr. Diamantina [place]  If he remains detectable at that point, he will discuss proceed.  Hollice Espy, MD

## 2018-01-27 ENCOUNTER — Ambulatory Visit: Payer: Medicare HMO

## 2018-02-05 ENCOUNTER — Other Ambulatory Visit: Payer: Self-pay | Admitting: Family Medicine

## 2018-02-05 DIAGNOSIS — F5102 Adjustment insomnia: Secondary | ICD-10-CM

## 2018-02-07 ENCOUNTER — Other Ambulatory Visit: Payer: Self-pay | Admitting: Family Medicine

## 2018-02-07 DIAGNOSIS — F5102 Adjustment insomnia: Secondary | ICD-10-CM

## 2018-02-11 ENCOUNTER — Ambulatory Visit (INDEPENDENT_AMBULATORY_CARE_PROVIDER_SITE_OTHER): Payer: Medicare HMO

## 2018-02-11 VITALS — BP 112/70 | HR 95 | Temp 97.9°F | Resp 12 | Ht 69.0 in | Wt 184.5 lb

## 2018-02-11 DIAGNOSIS — Z0000 Encounter for general adult medical examination without abnormal findings: Secondary | ICD-10-CM

## 2018-02-11 DIAGNOSIS — Z599 Problem related to housing and economic circumstances, unspecified: Secondary | ICD-10-CM

## 2018-02-11 DIAGNOSIS — Z598 Other problems related to housing and economic circumstances: Secondary | ICD-10-CM | POA: Diagnosis not present

## 2018-02-11 DIAGNOSIS — Z23 Encounter for immunization: Secondary | ICD-10-CM | POA: Diagnosis not present

## 2018-02-11 NOTE — Progress Notes (Addendum)
Subjective:   Ernest Sweeney is a 67 y.o. male who presents for Medicare Annual/Subsequent preventive examination.  Review of Systems:  N/A Cardiac Risk Factors include: advanced age (>22men, >54 women);male gender;dyslipidemia;hypertension;sedentary lifestyle     Objective:    Vitals: BP 112/70 (BP Location: Left Arm, Patient Position: Sitting, Cuff Size: Normal)   Pulse 95   Temp 97.9 F (36.6 C) (Oral)   Resp 12   Ht 5\' 9"  (1.753 m)   Wt 184 lb 8 oz (83.7 kg)   SpO2 95%   BMI 27.25 kg/m   Body mass index is 27.25 kg/m.  Advanced Directives 02/11/2018 02/24/2017 01/28/2017 08/04/2016 07/15/2016 07/03/2016 06/15/2016  Does Patient Have a Medical Advance Directive? No No No No No No No  Would patient like information on creating a medical advance directive? Yes (MAU/Ambulatory/Procedural Areas - Information given) - - - No - Patient declined No - Patient declined -    Tobacco Social History   Tobacco Use  Smoking Status Former Smoker  . Packs/day: 0.50  . Years: 40.00  . Pack years: 20.00  . Types: Cigarettes  . Last attempt to quit: 03/23/2016  . Years since quitting: 1.8  Smokeless Tobacco Never Used  Tobacco Comment   smoking cessation materials not required     Counseling given: No Comment: smoking cessation materials not required  Clinical Intake:  Pre-visit preparation completed: Yes  Pain : No/denies pain   BMI - recorded: 27.25 Nutritional Status: BMI 25 -29 Overweight Nutritional Risks: None Diabetes: No  How often do you need to have someone help you when you read instructions, pamphlets, or other written materials from your doctor or pharmacy?: 1 - Never  Interpreter Needed?: No  Information entered by :: AEversole, LPN  Past Medical History:  Diagnosis Date  . Anxiety    Panic attacks in the past  . Arthritis   . Benign prostatic hypertrophy without lower urinary tract symptoms   . Cancer [facility])    Prostate  . Colon polyp   . GERD  (gastroesophageal reflux disease)   . Glaucoma (increased eye pressure)    Left eye only  . Hyperlipidemia   . Hypertension    Past Surgical History:  Procedure Laterality Date  . COLONOSCOPY  2010   Dr Sula Rumple  . excision skin cyst  06-19-14   Dr. Festus Aloe CYST  . PELVIC LYMPH NODE DISSECTION N/A 07/15/2016   Procedure: PELVIC LYMPH NODE DISSECTION;  Surgeon: Nickie Retort, MD;  Location: [facility] ORS;  Service: Urology;  Laterality: N/A;  . ROBOT ASSISTED LAPAROSCOPIC RADICAL PROSTATECTOMY N/A 07/15/2016   Procedure: ROBOTIC ASSISTED LAPAROSCOPIC RADICAL PROSTATECTOMY;  Surgeon: Nickie Retort, MD;  Location: [facility] ORS;  Service: Urology;  Laterality: N/A;   Family History  Problem Relation Age of Onset  . Heart disease Mother   . Heart attack Mother   . Diabetes Father   . Heart attack Father   . Cancer Sister        Breast  . Cancer Brother        Prostate  . Healthy Sister   . Stroke Brother    Social History   Socioeconomic History  . Marital status: Widowed    Spouse name: Not on file  . Number of children: 0  . Years of education: Not on file  . Highest education level: 12th grade  Occupational History  . Occupation: Retired  Scientific laboratory technician  . Financial resource strain: Somewhat hard  . Food insecurity:  Worry: Sometimes true    Inability: Sometimes true  . Transportation needs:    Medical: No    Non-medical: No  Tobacco Use  . Smoking status: Former Smoker    Packs/day: 0.50    Years: 40.00    Pack years: 20.00    Types: Cigarettes    Last attempt to quit: 03/23/2016    Years since quitting: 1.8  . Smokeless tobacco: Never Used  . Tobacco comment: smoking cessation materials not required  Substance and Sexual Activity  . Alcohol use: Yes    Alcohol/week: 0.0 oz    Comment: social  . Drug use: No  . Sexual activity: Not Currently    Birth control/protection: Condom  Lifestyle  . Physical activity:    Days per week: 0 days    Minutes  per session: 0 min  . Stress: Not at all  Relationships  . Social connections:    Talks on phone: Patient refused    Gets together: Patient refused    Attends religious service: Patient refused    Active member of club or organization: Patient refused    Attends meetings of clubs or organizations: Patient refused    Relationship status: Widowed  Other Topics Concern  . Not on file  Social History Narrative  . Not on file    Outpatient Encounter Medications as of 02/11/2018  Medication Sig  . aspirin 81 MG tablet Take 81 mg by mouth daily.  Marland Kitchen atorvastatin (LIPITOR) 40 MG tablet Take 1 tablet (40 mg total) by mouth daily. New cholesterol medication  . Cholecalciferol (VITAMIN D) 2000 units CAPS Take 1 capsule (2,000 Units total) by mouth daily.  Marland Kitchen latanoprost (XALATAN) 0.005 % ophthalmic solution Place 1 drop into both eyes at bedtime.   Marland Kitchen lisinopril (PRINIVIL,ZESTRIL) 20 MG tablet Take 1 tablet (20 mg total) by mouth daily.  . sildenafil (REVATIO) 20 MG tablet Take 1 to 5 tablets PO daily prn  . traZODone (DESYREL) 50 MG tablet TAKE 1/2 TO 1 (ONE-HALF TO ONE) TABLET BY MOUTH AT BEDTIME AS NEEDED FOR SLEEP  . triamcinolone cream (KENALOG) 0.1 % Apply 1 application topically 2 (two) times daily.   No facility-administered encounter medications on file as of 02/11/2018.     Activities of Daily Living In your present state of health, do you have any difficulty performing the following activities: 02/11/2018 01/05/2018  Hearing? N N  Comment denies hearing aids -  Vision? N N  Comment wears eyeglasses -  Difficulty concentrating or making decisions? N N  Walking or climbing stairs? N N  Dressing or bathing? N N  Doing errands, shopping? N N  Preparing Food and eating ? N -  Comment denies dentures -  Using the Toilet? N -  In the past six months, have you accidently leaked urine? N -  Do you have problems with loss of bowel control? N -  Managing your Medications? N -  Managing your  Finances? N -  Housekeeping or managing your Housekeeping? N -  Some recent data might be hidden    Patient Care Team: [name], MD as PCP - General (Family Medicine) Abbie Sons, MD as Consulting Physician (Urology)   Assessment:   This is a routine wellness examination for [place].  Exercise Activities and Dietary recommendations Current Exercise Habits: The patient does not participate in regular exercise at present, Exercise limited by: None identified  Goals    . DIET - INCREASE WATER INTAKE  Recommend to drink at least 6-8 8oz glasses of water per day.       Fall Risk Fall Risk  02/11/2018 01/05/2018 05/07/2017 02/05/2017 08/04/2016  Falls in the past year? No No No No No  Risk for fall due to : Impaired vision - - - -  Risk for fall due to: Comment wears eyeglasses - - - -   FALL RISK PREVENTION PERTAINING TO HOME: Is your home free of loose throw rugs in walkways, pet beds, electrical cords, etc? Yes Is there adequate lighting in your home to reduce risk of falls?  Yes Are there stairs in or around your home WITH handrails? Yes  ASSISTIVE DEVICES UTILIZED TO PREVENT FALLS: Use of a cane, walker or w/c? No Grab bars in the bathroom? No  Shower chair or a place to sit while bathing? No An elevated toilet seat or a handicapped toilet? No  Timed Get Up and Go Performed: Yes. Pt ambulated 10 feet within 8 sec. Gait stead-fast and without the use of an assistive device. No intervention required at this time. Fall risk prevention has been discussed.  Community Resource Referral:  Pt declined my offer to send [employer] Referral to Care Guide for installation of grab bars in the shower, shower chair or an elevated toilet seat.  Depression Screen PHQ 2/9 Scores 02/11/2018 01/05/2018 02/05/2017 08/04/2016  PHQ - 2 Score 2 0 0 3  PHQ- 9 Score 10 - - 3    Cognitive Function     6CIT Screen 02/11/2018 02/05/2017  What Year? 0 points 0 points  What month? 0  points 0 points  What time? 0 points 0 points  Count back from 20 0 points 0 points  Months in reverse 0 points 0 points  Repeat phrase 0 points 8 points  Total Score 0 8    Immunization History  Administered Date(s) Administered  . Influenza, High Dose Seasonal PF 05/21/2016, 05/07/2017  . Influenza-Unspecified 04/12/2014, 04/27/2015  . Pneumococcal Conjugate-13 02/05/2017  . Pneumococcal Polysaccharide-23 03/08/2012, 02/11/2018  . Tdap 04/03/2010  . Zoster 02/05/2016    Qualifies for Shingles Vaccine? Yes. Zostavax completed 02/05/16. Due for Shingrix. Education has been provided regarding the importance of this vaccine. Pt has been advised to call insurance company to determine out of pocket expense. Advised may also receive vaccine at local pharmacy or Health Dept. Verbalized acceptance and understanding.  Screening Tests Health Maintenance  Topic Date Due  . INFLUENZA VACCINE  02/10/2018  . COLONOSCOPY  10/10/2019  . TETANUS/TDAP  04/03/2020  . Hepatitis C Screening  Completed  . PNA vac Low Risk Adult  Completed   Cancer Screenings: Lung: Low Dose CT Chest recommended if Age 90-80 years, 30 pack-year currently smoking OR have quit w/in 15years. Patient does not qualify. Colorectal: Completed 10/10/14. Repeat every 5 years  Additional Screenings: Hepatitis C Screening: Completed 03/14/12     Plan:  I have personally reviewed and addressed the Medicare Annual Wellness questionnaire and have noted the following in the patient's chart:  A. Medical and social history B. Use of alcohol, tobacco or illicit drugs  C. Current medications and supplements D. Functional ability and status E.  Nutritional status F.  Physical activity G. Advance directives H. List of other physicians I.  Hospitalizations, surgeries, and ER visits in previous 12 months J.  Miller such as hearing and vision if needed, cognitive and depression L. Referrals and appointments  In  addition, I have reviewed and discussed with  patient certain preventive protocols, quality metrics, and best practice recommendations. A written personalized care plan for preventive services as well as general preventive health recommendations were provided to patient.  See attached scanned questionnaire for additional information.   Signed,  Aleatha Borer, LPN Nurse Health Advisor  I have reviewed this encounter including the documentation in this note and/or discussed this patient with the provider, Aleatha Borer, LPN. I am certifying that I agree with the content of this note as supervising physician.  [name], MD [facility] 02/11/2018, 1:50 PM

## 2018-02-11 NOTE — Patient Instructions (Addendum)
Mr. Ernest Sweeney , Thank you for taking time to come for your Medicare Wellness Visit. I appreciate your ongoing commitment to your health goals. Please review the following plan we discussed and let me know if I can assist you in the future.   Screening recommendations/referrals: Colorectal Screening: Up to date  Vision and Dental Exams: Recommended annual ophthalmology exams for early detection of glaucoma and other disorders of the eye Recommended annual dental exams for proper oral hygiene  Vaccinations: Influenza vaccine: Up to date Pneumococcal vaccine: Up to date Tdap vaccine: Up to date Shingles vaccine: Please call your insurance company to determine your out of pocket expense for the Shingrix vaccine. You may also receive this vaccine at your local pharmacy or Health Dept.    Advanced directives: Advance directive discussed with you today. I have provided a copy for you to complete at home and have notarized. Once this is complete please bring a copy in to our office so we can scan it into your chart.  Goals: Recommend to drink at least 6-8 8oz glasses of water per day.  Next appointment: Please schedule your Annual Wellness Visit with your Nurse Health Advisor in one year.  Preventive Care 7 Years and Older, Male Preventive care refers to lifestyle choices and visits with your health care provider that can promote health and wellness. What does preventive care include?  A yearly physical exam. This is also called an annual well check.  Dental exams once or twice a year.  Routine eye exams. Ask your health care provider how often you should have your eyes checked.  Personal lifestyle choices, including:  Daily care of your teeth and gums.  Regular physical activity.  Eating a healthy diet.  Avoiding tobacco and drug use.  Limiting alcohol use.  Practicing safe sex.  Taking low doses of aspirin every day.  Taking vitamin and mineral supplements as recommended by your  health care provider. What happens during an annual well check? The services and screenings done by your health care provider during your annual well check will depend on your age, overall health, lifestyle risk factors, and family history of disease. Counseling  Your health care provider may ask you questions about your:  Alcohol use.  Tobacco use.  Drug use.  Emotional well-being.  Home and relationship well-being.  Sexual activity.  Eating habits.  History of falls.  Memory and ability to understand (cognition).  Work and work Statistician. Screening  You may have the following tests or measurements:  Height, weight, and BMI.  Blood pressure.  Lipid and cholesterol levels. These may be checked every 5 years, or more frequently if you are over 67 years old.  Skin check.  Lung cancer screening. You may have this screening every year starting at age 50 if you have a 30-pack-year history of smoking and currently smoke or have quit within the past 15 years.  Fecal occult blood test (FOBT) of the stool. You may have this test every year starting at age 81.  Flexible sigmoidoscopy or colonoscopy. You may have a sigmoidoscopy every 5 years or a colonoscopy every 10 years starting at age 39.  Prostate cancer screening. Recommendations will vary depending on your family history and other risks.  Hepatitis C blood test.  Hepatitis B blood test.  Sexually transmitted disease (STD) testing.  Diabetes screening. This is done by checking your blood sugar (glucose) after you have not eaten for a while (fasting). You may have this done every 1-3 years.  Abdominal aortic aneurysm (AAA) screening. You may need this if you are a current or former smoker.  Osteoporosis. You may be screened starting at age 35 if you are at high risk. Talk with your health care provider about your test results, treatment options, and if necessary, the need for more tests. Vaccines  Your health  care provider may recommend certain vaccines, such as:  Influenza vaccine. This is recommended every year.  Tetanus, diphtheria, and acellular pertussis (Tdap, Td) vaccine. You may need a Td booster every 10 years.  Zoster vaccine. You may need this after age 57.  Pneumococcal 13-valent conjugate (PCV13) vaccine. One dose is recommended after age 84.  Pneumococcal polysaccharide (PPSV23) vaccine. One dose is recommended after age 61. Talk to your health care provider about which screenings and vaccines you need and how often you need them. This information is not intended to replace advice given to you by your health care provider. Make sure you discuss any questions you have with your health care provider. Document Released: 07/26/2015 Document Revised: 03/18/2016 Document Reviewed: 04/30/2015 Elsevier Interactive Patient Education  2017 [place] Prevention in the Home Falls can cause injuries. They can happen to people of all ages. There are many things you can do to make your home safe and to help prevent falls. What can I do on the outside of my home?  Regularly fix the edges of walkways and driveways and fix any cracks.  Remove anything that might make you trip as you walk through a door, such as a raised step or threshold.  Trim any bushes or trees on the path to your home.  Use bright outdoor lighting.  Clear any walking paths of anything that might make someone trip, such as rocks or tools.  Regularly check to see if handrails are loose or broken. Make sure that both sides of any steps have handrails.  Any raised decks and porches should have guardrails on the edges.  Have any leaves, snow, or ice cleared regularly.  Use sand or salt on walking paths during winter.  Clean up any spills in your garage right away. This includes oil or grease spills. What can I do in the bathroom?  Use night lights.  Install grab bars by the toilet and in the tub and  shower. Do not use towel bars as grab bars.  Use non-skid mats or decals in the tub or shower.  If you need to sit down in the shower, use a plastic, non-slip stool.  Keep the floor dry. Clean up any water that spills on the floor as soon as it happens.  Remove soap buildup in the tub or shower regularly.  Attach bath mats securely with double-sided non-slip rug tape.  Do not have throw rugs and other things on the floor that can make you trip. What can I do in the bedroom?  Use night lights.  Make sure that you have a light by your bed that is easy to reach.  Do not use any sheets or blankets that are too big for your bed. They should not hang down onto the floor.  Have a firm chair that has side arms. You can use this for support while you get dressed.  Do not have throw rugs and other things on the floor that can make you trip. What can I do in the kitchen?  Clean up any spills right away.  Avoid walking on wet floors.  Keep items that you use  a lot in easy-to-reach places.  If you need to reach something above you, use a strong step stool that has a grab bar.  Keep electrical cords out of the way.  Do not use floor polish or wax that makes floors slippery. If you must use wax, use non-skid floor wax.  Do not have throw rugs and other things on the floor that can make you trip. What can I do with my stairs?  Do not leave any items on the stairs.  Make sure that there are handrails on both sides of the stairs and use them. Fix handrails that are broken or loose. Make sure that handrails are as long as the stairways.  Check any carpeting to make sure that it is firmly attached to the stairs. Fix any carpet that is loose or worn.  Avoid having throw rugs at the top or bottom of the stairs. If you do have throw rugs, attach them to the floor with carpet tape.  Make sure that you have a light switch at the top of the stairs and the bottom of the stairs. If you do not  have them, ask someone to add them for you. What else can I do to help prevent falls?  Wear shoes that:  Do not have high heels.  Have rubber bottoms.  Are comfortable and fit you well.  Are closed at the toe. Do not wear sandals.  If you use a stepladder:  Make sure that it is fully opened. Do not climb a closed stepladder.  Make sure that both sides of the stepladder are locked into place.  Ask someone to hold it for you, if possible.  Clearly mark and make sure that you can see:  Any grab bars or handrails.  First and last steps.  Where the edge of each step is.  Use tools that help you move around (mobility aids) if they are needed. These include:  Canes.  Walkers.  Scooters.  Crutches.  Turn on the lights when you go into a dark area. Replace any light bulbs as soon as they burn out.  Set up your furniture so you have a clear path. Avoid moving your furniture around.  If any of your floors are uneven, fix them.  If there are any pets around you, be aware of where they are.  Review your medicines with your doctor. Some medicines can make you feel dizzy. This can increase your chance of falling. Ask your doctor what other things that you can do to help prevent falls. This information is not intended to replace advice given to you by your health care provider. Make sure you discuss any questions you have with your health care provider. Document Released: 04/25/2009 Document Revised: 12/05/2015 Document Reviewed: 08/03/2014 Elsevier Interactive Patient Education  2017 [employer].

## 2018-02-16 ENCOUNTER — Other Ambulatory Visit: Payer: Medicare HMO

## 2018-02-16 DIAGNOSIS — C61 Malignant neoplasm of prostate: Secondary | ICD-10-CM

## 2018-02-17 ENCOUNTER — Ambulatory Visit: Payer: Medicare HMO | Admitting: Urology

## 2018-02-17 LAB — PSA: Prostate Specific Ag, Serum: 0.2 ng/mL (ref 0.0–4.0)

## 2018-02-22 ENCOUNTER — Ambulatory Visit: Payer: Medicare HMO | Admitting: Urology

## 2018-02-22 ENCOUNTER — Encounter: Payer: Self-pay | Admitting: Urology

## 2018-02-22 VITALS — BP 128/84 | HR 119 | Ht 69.0 in | Wt 185.1 lb

## 2018-02-22 DIAGNOSIS — C61 Malignant neoplasm of prostate: Secondary | ICD-10-CM | POA: Diagnosis not present

## 2018-02-22 NOTE — Progress Notes (Signed)
   02/22/2018 10:01 AM   Ernest Sweeney Jul 25, 1950 [id number]  Reason for visit: Follow up PSA after RALP 07/15/2016  HPI: I am seeing Ernest Sweeney in urology clinic in follow-up for surveillance after prostatectomy.  Briefly he underwent a robotic prostatectomy and bilateral lymphadenectomy in January 2018 with Dr. Pilar Jarvis.  Final pathology showed Gleason score 3+4 = 7 prostate cancer, stage [id number].  There was focal extraprostatic extension and perineural invasion, and margins were negative.  Pretreatment PSA was 6.  PSA after surgery was undetectable  He reports he has been doing well and denies any urinary symptoms or stress incontinence.  He does report erectile dysfunction since the time of surgery, this has not improved with PDE 5 inhibitors.  He denies any weight loss or bone pain  ROS: Please see flowsheet from today's date for complete review of systems.  Physical Exam: There were no vitals taken for this visit.  Constitutional:  Alert and oriented, No acute distress. Respiratory: Normal respiratory effort, no increased work of breathing. GI: Abdomen is soft, nontender, nondistended, no abdominal masses DRE: Consistent with history of prostatectomy, empty fossa, subtle firmness on left side Skin: No rashes, bruises or suspicious lesions. Neurologic: Grossly intact, no focal deficits, moving all 4 extremities. Psychiatric: Normal mood and affect  Laboratory Data:  PSA  Pre-treatment 6 Post-op <0.1 01/25/2018 0.2 02/16/2018 0.2  Pertinent Imaging: None to review  Assessment & Plan:   In summary, Ernest Sweeney is a 67 year old African-American male with a history of intermediate risk prostate cancer treated with robotic prostatectomy and lymphadenectomy in January 2018.  Unfortunately, he has had a biochemical recurrence with two PSA values over the last month of 0.2.  We discussed at length the meaning of biochemical recurrence and the significance moving forward. I recommended  consultation with radiation oncology for salvage radiotherapy.  We also discussed adjuvant hormone therapy for at least 6 months duration, however he would like to defer ADT at this time.   PLAN: Consult to RadOnc for salvage radiation Follow up in ~6 months with PSA after XRT  [place]ley [place] 02/22/2018   [place] [address], [place], [place] [address] [phone]

## 2018-03-02 ENCOUNTER — Encounter: Payer: Self-pay | Admitting: Radiation Oncology

## 2018-03-02 ENCOUNTER — Telehealth: Payer: Self-pay | Admitting: Urology

## 2018-03-02 ENCOUNTER — Ambulatory Visit
Admission: RE | Admit: 2018-03-02 | Discharge: 2018-03-02 | Disposition: A | Discharge status: Home / Self Care | Payer: Medicare HMO | Source: Ambulatory Visit | Attending: Radiation Oncology | Admitting: Radiation Oncology

## 2018-03-02 ENCOUNTER — Other Ambulatory Visit: Payer: Self-pay

## 2018-03-02 VITALS — BP 120/79 | HR 90 | Resp 16 | Wt 185.8 lb

## 2018-03-02 DIAGNOSIS — E785 Hyperlipidemia, unspecified: Secondary | ICD-10-CM | POA: Insufficient documentation

## 2018-03-02 DIAGNOSIS — Z800 Family history of malignant neoplasm of digestive organs: Secondary | ICD-10-CM | POA: Insufficient documentation

## 2018-03-02 DIAGNOSIS — Z7982 Long term (current) use of aspirin: Secondary | ICD-10-CM | POA: Insufficient documentation

## 2018-03-02 DIAGNOSIS — N400 Enlarged prostate without lower urinary tract symptoms: Secondary | ICD-10-CM | POA: Insufficient documentation

## 2018-03-02 DIAGNOSIS — I10 Essential (primary) hypertension: Secondary | ICD-10-CM | POA: Insufficient documentation

## 2018-03-02 DIAGNOSIS — Z79899 Other long term (current) drug therapy: Secondary | ICD-10-CM | POA: Insufficient documentation

## 2018-03-02 DIAGNOSIS — K219 Gastro-esophageal reflux disease without esophagitis: Secondary | ICD-10-CM | POA: Insufficient documentation

## 2018-03-02 DIAGNOSIS — Z86010 Personal history of colonic polyps: Secondary | ICD-10-CM | POA: Insufficient documentation

## 2018-03-02 DIAGNOSIS — M129 Arthropathy, unspecified: Secondary | ICD-10-CM | POA: Insufficient documentation

## 2018-03-02 DIAGNOSIS — N393 Stress incontinence (female) (male): Secondary | ICD-10-CM | POA: Insufficient documentation

## 2018-03-02 DIAGNOSIS — F419 Anxiety disorder, unspecified: Secondary | ICD-10-CM | POA: Diagnosis not present

## 2018-03-02 DIAGNOSIS — C61 Malignant neoplasm of prostate: Secondary | ICD-10-CM

## 2018-03-02 DIAGNOSIS — Z87891 Personal history of nicotine dependence: Secondary | ICD-10-CM | POA: Insufficient documentation

## 2018-03-02 DIAGNOSIS — R69 Illness, unspecified: Secondary | ICD-10-CM | POA: Diagnosis not present

## 2018-03-02 NOTE — Telephone Encounter (Signed)
-----   Message from Daiva Huge, RN sent at 03/02/2018  2:26 PM EDT ----- Regarding: Lupron [name],   Dr. Baruch Gouty said to notify your office that Mr. Ernest Sweeney needs to be started on Lupron.   It looks like Dr. Diamantina Providence is who referred him to [place] and had discussed it with the patient.

## 2018-03-02 NOTE — Consult Note (Signed)
NEW PATIENT EVALUATION  Name: Ernest Sweeney  MRN: [id number]  Date:   03/02/2018     DOB: 01-01-1951   This 67 y.o. male patient presents to the clinic for initial evaluation of stage III (T3 N0 M0) Gleason 7 (3+4) adenocarcinoma the prostate with biochemical recurrence after robotic prostatectomy.  REFERRING PHYSICIAN: [name], MD  CHIEF COMPLAINT:  Chief Complaint  Patient presents with  . Prostate Cancer    Pt is here for initial consultation of prostate cancer    DIAGNOSIS: The encounter diagnosis was Prostate cancer ([place]).   PREVIOUS INVESTIGATIONS:  Pathology reports reviewed Clinical notes reviewed  HPI: patient is a 67 year old male originally presented back inJuly 2017 with an elevated PSA of 5.9. October 2017 he underwent transrectal ultrasound-guided biopsy showing 10 of 12 cores positive for Gleason 6 (3+3) adenocarcinoma the prostate.he went on to have a robotic-assisted prostatectomy in January 2018 showing Gleason 7 (3+4) adenocarcinoma the prostate with focal extraprostatic extension and perineural invasion identified. 4 pelvic lymph nodes were negative for metastatic disease. He tolerated her surgery well he has very mild stress incontinence. Also has some slight erectile dysfunction.unfortunately his PSA has started to rise has been 0.2 on 2 consecutive measurements.He is otherwise doing well is now referred to radiation oncology for consideration of treatment.  PLANNED TREATMENT REGIMEN: salvage radiation therapy with androgen deprivation therapy  PAST MEDICAL HISTORY:  has a past medical history of Anxiety, Arthritis, Benign prostatic hypertrophy without lower urinary tract symptoms, Cancer ([place]), Colon polyp, GERD (gastroesophageal reflux disease), Glaucoma (increased eye pressure), Hyperlipidemia, and Hypertension.    PAST SURGICAL HISTORY:  Past Surgical History:  Procedure Laterality Date  . COLONOSCOPY  2010   Dr Sula Rumple  . excision skin cyst   06-19-14   Dr. Festus Aloe CYST  . PELVIC LYMPH NODE DISSECTION N/A 07/15/2016   Procedure: PELVIC LYMPH NODE DISSECTION;  Surgeon: Nickie Retort, MD;  Location: [facility] ORS;  Service: Urology;  Laterality: N/A;  . ROBOT ASSISTED LAPAROSCOPIC RADICAL PROSTATECTOMY N/A 07/15/2016   Procedure: ROBOTIC ASSISTED LAPAROSCOPIC RADICAL PROSTATECTOMY;  Surgeon: Nickie Retort, MD;  Location: [facility] ORS;  Service: Urology;  Laterality: N/A;    FAMILY HISTORY: family history includes Cancer in his brother and sister; Diabetes in his father; Healthy in his sister; Heart attack in his father and mother; Heart disease in his mother; Stroke in his brother.  SOCIAL HISTORY:  reports that he quit smoking about 23 months ago. His smoking use included cigarettes. He has a 20.00 pack-year smoking history. He has never used smokeless tobacco. He reports that he drinks alcohol. He reports that he does not use drugs.  ALLERGIES: Patient has no known allergies.  MEDICATIONS:  Current Outpatient Medications  Medication Sig Dispense Refill  . aspirin 81 MG tablet Take 81 mg by mouth daily.    Marland Kitchen atorvastatin (LIPITOR) 40 MG tablet Take 1 tablet (40 mg total) by mouth daily. New cholesterol medication 90 tablet 1  . Cholecalciferol (VITAMIN D) 2000 units CAPS Take 1 capsule (2,000 Units total) by mouth daily. 30 capsule 0  . latanoprost (XALATAN) 0.005 % ophthalmic solution Place 1 drop into both eyes at bedtime.     Marland Kitchen lisinopril (PRINIVIL,ZESTRIL) 20 MG tablet Take 1 tablet (20 mg total) by mouth daily. 90 tablet 0  . sildenafil (REVATIO) 20 MG tablet Take 1 to 5 tablets PO daily prn 30 tablet 3  . traZODone (DESYREL) 50 MG tablet TAKE 1/2 TO 1 (ONE-HALF TO ONE) TABLET BY  MOUTH AT BEDTIME AS NEEDED FOR SLEEP 90 tablet 0  . triamcinolone cream (KENALOG) 0.1 % Apply 1 application topically 2 (two) times daily. 45 g 0   No current facility-administered medications for this encounter.     ECOG PERFORMANCE  STATUS:  0 - Asymptomatic  REVIEW OF SYSTEMS:  Patient denies any weight loss, fatigue, weakness, fever, chills or night sweats. Patient denies any loss of vision, blurred vision. Patient denies any ringing  of the ears or hearing loss. No irregular heartbeat. Patient denies heart murmur or history of fainting. Patient denies any chest pain or pain radiating to her upper extremities. Patient denies any shortness of breath, difficulty breathing at night, cough or hemoptysis. Patient denies any swelling in the lower legs. Patient denies any nausea vomiting, vomiting of blood, or coffee ground material in the vomitus. Patient denies any stomach pain. Patient states has had normal bowel movements no significant constipation or diarrhea. Patient denies any dysuria, hematuria or significant nocturia. Patient denies any problems walking, swelling in the joints or loss of balance. Patient denies any skin changes, loss of hair or loss of weight. Patient denies any excessive worrying or anxiety or significant depression. Patient denies any problems with insomnia. Patient denies excessive thirst, polyuria, polydipsia. Patient denies any swollen glands, patient denies easy bruising or easy bleeding. Patient denies any recent infections, allergies or URI. Patient "s visual fields have not changed significantly in recent time.    PHYSICAL EXAM: BP 120/79 (BP Location: Left Arm, Patient Position: Sitting)   Pulse 90   Resp 16   Wt 185 lb 13.6 oz (84.3 kg)   BMI 27.44 kg/m  On rectal exam rectal sphincter tone is good prostatic fossa is clear without evidence of nodularity. Rectal exam otherwise is unremarkable.Well-developed well-nourished patient in NAD. HEENT reveals PERLA, EOMI, discs not visualized.  Oral cavity is clear. No oral mucosal lesions are identified. Neck is clear without evidence of cervical or supraclavicular adenopathy. Lungs are clear to A&P. Cardiac examination is essentially unremarkable with  regular rate and rhythm without murmur rub or thrill. Abdomen is benign with no organomegaly or masses noted. Motor sensory and DTR levels are equal and symmetric in the upper and lower extremities. Cranial nerves II through XII are grossly intact. Proprioception is intact. No peripheral adenopathy or edema is identified. No motor or sensory levels are noted. Crude visual fields are within normal range.  LABORATORY DATA: pathology reports reviewed    RADIOLOGY RESULTS:films for review   IMPRESSION: stage III Gleason 7 (3+4) adenocarcinoma the prostate status post robotic-assisted prostatectomy in 67 year old male with extracapsular extension and rising PSA  PLAN: at this time I to go ahead with salvage radiation therapy. I would use I MRT treatment planning and delivery. I would treat up to 7600 cGy to the prostatic fossa. I have run the [facility] nomogram showing only at best a 9% chance of pelvic lymph node involvement so I will spare his pelvic nodes and concentrate on the prostatic fossa. Multiple studies also shown short-term Angie deprivation therapy along with radiation therapy in the salvage mode after prostatectomy show significant progression free survival.Advantage. I have referred him back to neurology to initiate androgen deprivation therapy. Some studies are showing as much as 6 months ofADT therapy is sufficient to help with progression free survival. Risks and benefits of treatment including increased lower urinary tract symptoms diarrhea fatigue alteration of blood counts possible worsening of erectile dysfunction all were discussed in detail with the patient.  I've personally set up and ordered CT simulation for next week. Patient seems to comprehend my treatment plan well  I would like to take this opportunity to thank you for allowing me to participate in the care of your patient.Noreene Filbert, MD

## 2018-03-02 NOTE — Telephone Encounter (Signed)
Ernest Sweeney, When can he get scheduled for Lupron?  Ernest Sweeney

## 2018-03-03 NOTE — Telephone Encounter (Signed)
Sure thing nurse visit is ok

## 2018-03-04 ENCOUNTER — Telehealth: Payer: Self-pay | Admitting: Urology

## 2018-03-04 NOTE — Telephone Encounter (Signed)
Yes he can get scheduled now and he will just have a one time dose of a 6 month Lurpon thanks

## 2018-03-04 NOTE — Telephone Encounter (Signed)
Can he get scheduled now and is It for 6 month?   Ernest Sweeney

## 2018-03-08 ENCOUNTER — Ambulatory Visit (INDEPENDENT_AMBULATORY_CARE_PROVIDER_SITE_OTHER): Payer: Medicare HMO

## 2018-03-08 ENCOUNTER — Ambulatory Visit
Admission: RE | Admit: 2018-03-08 | Discharge: 2018-03-08 | Disposition: A | Discharge status: Home / Self Care | Payer: Medicare HMO | Source: Ambulatory Visit | Attending: Radiation Oncology | Admitting: Radiation Oncology

## 2018-03-08 ENCOUNTER — Other Ambulatory Visit: Payer: Self-pay

## 2018-03-08 VITALS — BP 128/84 | HR 119 | Ht 69.0 in | Wt 185.0 lb

## 2018-03-08 DIAGNOSIS — C61 Malignant neoplasm of prostate: Secondary | ICD-10-CM

## 2018-03-08 DIAGNOSIS — Z510 Encounter for antineoplastic radiation therapy: Secondary | ICD-10-CM | POA: Diagnosis not present

## 2018-03-08 MED ORDER — LEUPROLIDE ACETATE (6 MONTH) 45 MG IM KIT
45.0000 mg | PACK | Freq: Once | INTRAMUSCULAR | Status: AC
  Administered 2018-03-08: 45 mg via INTRAMUSCULAR

## 2018-03-08 NOTE — Progress Notes (Signed)
Lupron IM Injection   Due to Prostate Cancer patient is present today for a Lupron Injection.  Medication: Lupron 6 month Dose: 45 mg  Location: right upper outer buttocks Lot: [id number] Exp: 06/04/2020  Patient tolerated well, no complications were noted  Performed by: Cristie Hem, CMA  Follow up: As scheduled

## 2018-03-09 DIAGNOSIS — C61 Malignant neoplasm of prostate: Secondary | ICD-10-CM | POA: Diagnosis not present

## 2018-03-09 DIAGNOSIS — Z510 Encounter for antineoplastic radiation therapy: Secondary | ICD-10-CM | POA: Diagnosis not present

## 2018-03-17 ENCOUNTER — Other Ambulatory Visit: Payer: Self-pay | Admitting: *Deleted

## 2018-03-17 DIAGNOSIS — C61 Malignant neoplasm of prostate: Secondary | ICD-10-CM

## 2018-03-21 ENCOUNTER — Ambulatory Visit
Admission: RE | Admit: 2018-03-21 | Discharge: 2018-03-21 | Disposition: A | Discharge status: Home / Self Care | Payer: Medicare HMO | Source: Ambulatory Visit | Attending: Radiation Oncology | Admitting: Radiation Oncology

## 2018-03-21 DIAGNOSIS — C61 Malignant neoplasm of prostate: Secondary | ICD-10-CM | POA: Insufficient documentation

## 2018-03-21 DIAGNOSIS — Z510 Encounter for antineoplastic radiation therapy: Secondary | ICD-10-CM | POA: Insufficient documentation

## 2018-03-22 ENCOUNTER — Ambulatory Visit
Admission: RE | Admit: 2018-03-22 | Discharge: 2018-03-22 | Disposition: A | Discharge status: Home / Self Care | Payer: Medicare HMO | Source: Ambulatory Visit | Attending: Radiation Oncology | Admitting: Radiation Oncology

## 2018-03-22 DIAGNOSIS — Z510 Encounter for antineoplastic radiation therapy: Secondary | ICD-10-CM | POA: Diagnosis not present

## 2018-03-22 DIAGNOSIS — C61 Malignant neoplasm of prostate: Secondary | ICD-10-CM | POA: Diagnosis not present

## 2018-03-23 ENCOUNTER — Ambulatory Visit
Admission: RE | Admit: 2018-03-23 | Discharge: 2018-03-23 | Disposition: A | Discharge status: Home / Self Care | Payer: Medicare HMO | Source: Ambulatory Visit | Attending: Radiation Oncology | Admitting: Radiation Oncology

## 2018-03-23 DIAGNOSIS — Z510 Encounter for antineoplastic radiation therapy: Secondary | ICD-10-CM | POA: Diagnosis not present

## 2018-03-23 DIAGNOSIS — C61 Malignant neoplasm of prostate: Secondary | ICD-10-CM | POA: Diagnosis not present

## 2018-03-24 ENCOUNTER — Ambulatory Visit
Admission: RE | Admit: 2018-03-24 | Discharge: 2018-03-24 | Disposition: A | Discharge status: Home / Self Care | Payer: Medicare HMO | Source: Ambulatory Visit | Attending: Radiation Oncology | Admitting: Radiation Oncology

## 2018-03-24 DIAGNOSIS — C61 Malignant neoplasm of prostate: Secondary | ICD-10-CM | POA: Diagnosis not present

## 2018-03-24 DIAGNOSIS — Z510 Encounter for antineoplastic radiation therapy: Secondary | ICD-10-CM | POA: Diagnosis not present

## 2018-03-25 ENCOUNTER — Ambulatory Visit
Admission: RE | Admit: 2018-03-25 | Discharge: 2018-03-25 | Disposition: A | Discharge status: Home / Self Care | Payer: Medicare HMO | Source: Ambulatory Visit | Attending: Radiation Oncology | Admitting: Radiation Oncology

## 2018-03-25 DIAGNOSIS — Z510 Encounter for antineoplastic radiation therapy: Secondary | ICD-10-CM | POA: Diagnosis not present

## 2018-03-25 DIAGNOSIS — C61 Malignant neoplasm of prostate: Secondary | ICD-10-CM | POA: Diagnosis not present

## 2018-03-28 ENCOUNTER — Ambulatory Visit
Admission: RE | Admit: 2018-03-28 | Discharge: 2018-03-28 | Disposition: A | Discharge status: Home / Self Care | Payer: Medicare HMO | Source: Ambulatory Visit | Attending: Radiation Oncology | Admitting: Radiation Oncology

## 2018-03-28 DIAGNOSIS — C61 Malignant neoplasm of prostate: Secondary | ICD-10-CM | POA: Diagnosis not present

## 2018-03-28 DIAGNOSIS — Z510 Encounter for antineoplastic radiation therapy: Secondary | ICD-10-CM | POA: Diagnosis not present

## 2018-03-29 ENCOUNTER — Ambulatory Visit
Admission: RE | Admit: 2018-03-29 | Discharge: 2018-03-29 | Disposition: A | Discharge status: Home / Self Care | Payer: Medicare HMO | Source: Ambulatory Visit | Attending: Radiation Oncology | Admitting: Radiation Oncology

## 2018-03-29 ENCOUNTER — Other Ambulatory Visit: Payer: Self-pay | Admitting: Family Medicine

## 2018-03-29 DIAGNOSIS — Z510 Encounter for antineoplastic radiation therapy: Secondary | ICD-10-CM | POA: Diagnosis not present

## 2018-03-29 DIAGNOSIS — C61 Malignant neoplasm of prostate: Secondary | ICD-10-CM | POA: Diagnosis not present

## 2018-03-29 DIAGNOSIS — F5102 Adjustment insomnia: Secondary | ICD-10-CM

## 2018-03-30 ENCOUNTER — Ambulatory Visit
Admission: RE | Admit: 2018-03-30 | Discharge: 2018-03-30 | Disposition: A | Discharge status: Home / Self Care | Payer: Medicare HMO | Source: Ambulatory Visit | Attending: Radiation Oncology | Admitting: Radiation Oncology

## 2018-03-30 ENCOUNTER — Other Ambulatory Visit: Payer: Self-pay | Admitting: Family Medicine

## 2018-03-30 DIAGNOSIS — F5102 Adjustment insomnia: Secondary | ICD-10-CM

## 2018-03-30 DIAGNOSIS — Z510 Encounter for antineoplastic radiation therapy: Secondary | ICD-10-CM | POA: Diagnosis not present

## 2018-03-30 DIAGNOSIS — C61 Malignant neoplasm of prostate: Secondary | ICD-10-CM | POA: Diagnosis not present

## 2018-03-30 NOTE — Telephone Encounter (Signed)
Will route to office for final disposition. 

## 2018-03-30 NOTE — Telephone Encounter (Signed)
Refill request was sent to Dr. Krichna Sowles for approval and submission.  

## 2018-03-30 NOTE — Telephone Encounter (Signed)
Copied from [place]. Topic: Quick Communication - Rx Refill/Question >> Mar 30, 2018  9:36 AM Yvette Rack wrote: Medication: traZODone (DESYREL) 50 MG tablet  pt calling stating that he thought he was throwing old medicine away a month ago but ended up throwing the newest medication away of the traZODone (DESYREL) 50 MG tablet and would like a new RX of it   Has the patient contacted their pharmacy? Yes.  Accidentally threw new medicine away  (Agent: If no, request that the patient contact the pharmacy for the refill.) (Agent: If yes, when and what did the pharmacy advise?) call provider  Preferred Pharmacy (with phone number or street name):   [facility] ([facility]), [place] - [place] [phone] (Phone) [phone] (Fax)    Agent: Please be advised that RX refills may take up to 3 business days. We ask that you follow-up with your pharmacy.

## 2018-03-31 ENCOUNTER — Ambulatory Visit
Admission: RE | Admit: 2018-03-31 | Discharge: 2018-03-31 | Disposition: A | Discharge status: Home / Self Care | Payer: Medicare HMO | Source: Ambulatory Visit | Attending: Radiation Oncology | Admitting: Radiation Oncology

## 2018-03-31 DIAGNOSIS — Z510 Encounter for antineoplastic radiation therapy: Secondary | ICD-10-CM | POA: Diagnosis not present

## 2018-03-31 DIAGNOSIS — C61 Malignant neoplasm of prostate: Secondary | ICD-10-CM | POA: Diagnosis not present

## 2018-04-01 ENCOUNTER — Ambulatory Visit
Admission: RE | Admit: 2018-04-01 | Discharge: 2018-04-01 | Disposition: A | Discharge status: Home / Self Care | Payer: Medicare HMO | Source: Ambulatory Visit | Attending: Radiation Oncology | Admitting: Radiation Oncology

## 2018-04-01 DIAGNOSIS — Z510 Encounter for antineoplastic radiation therapy: Secondary | ICD-10-CM | POA: Diagnosis not present

## 2018-04-01 DIAGNOSIS — C61 Malignant neoplasm of prostate: Secondary | ICD-10-CM | POA: Diagnosis not present

## 2018-04-04 ENCOUNTER — Ambulatory Visit
Admission: RE | Admit: 2018-04-04 | Discharge: 2018-04-04 | Disposition: A | Discharge status: Home / Self Care | Payer: Medicare HMO | Source: Ambulatory Visit | Attending: Radiation Oncology | Admitting: Radiation Oncology

## 2018-04-04 DIAGNOSIS — C61 Malignant neoplasm of prostate: Secondary | ICD-10-CM | POA: Diagnosis not present

## 2018-04-04 DIAGNOSIS — Z510 Encounter for antineoplastic radiation therapy: Secondary | ICD-10-CM | POA: Diagnosis not present

## 2018-04-05 ENCOUNTER — Other Ambulatory Visit: Payer: Self-pay

## 2018-04-05 ENCOUNTER — Inpatient Hospital Stay: Payer: Medicare HMO | Attending: Radiation Oncology

## 2018-04-05 ENCOUNTER — Ambulatory Visit
Admission: RE | Admit: 2018-04-05 | Discharge: 2018-04-05 | Disposition: A | Discharge status: Home / Self Care | Payer: Medicare HMO | Source: Ambulatory Visit | Attending: Radiation Oncology | Admitting: Radiation Oncology

## 2018-04-05 DIAGNOSIS — C61 Malignant neoplasm of prostate: Secondary | ICD-10-CM | POA: Diagnosis not present

## 2018-04-05 DIAGNOSIS — Z510 Encounter for antineoplastic radiation therapy: Secondary | ICD-10-CM | POA: Diagnosis not present

## 2018-04-05 LAB — CBC
HEMATOCRIT: 43.7 % (ref 40.0–52.0)
HEMOGLOBIN: 15.2 g/dL (ref 13.0–18.0)
MCH: 33.6 pg (ref 26.0–34.0)
MCHC: 34.9 g/dL (ref 32.0–36.0)
MCV: 96.3 fL (ref 80.0–100.0)
Platelets: 343 10*3/uL (ref 150–440)
RBC: 4.54 MIL/uL (ref 4.40–5.90)
RDW: 13 % (ref 11.5–14.5)
WBC: 3.7 10*3/uL — ABNORMAL LOW (ref 3.8–10.6)

## 2018-04-06 ENCOUNTER — Ambulatory Visit
Admission: RE | Admit: 2018-04-06 | Discharge: 2018-04-06 | Disposition: A | Discharge status: Home / Self Care | Payer: Medicare HMO | Source: Ambulatory Visit | Attending: Radiation Oncology | Admitting: Radiation Oncology

## 2018-04-06 DIAGNOSIS — Z510 Encounter for antineoplastic radiation therapy: Secondary | ICD-10-CM | POA: Diagnosis not present

## 2018-04-06 DIAGNOSIS — C61 Malignant neoplasm of prostate: Secondary | ICD-10-CM | POA: Diagnosis not present

## 2018-04-07 ENCOUNTER — Ambulatory Visit
Admission: RE | Admit: 2018-04-07 | Discharge: 2018-04-07 | Disposition: A | Discharge status: Home / Self Care | Payer: Medicare HMO | Source: Ambulatory Visit | Attending: Radiation Oncology | Admitting: Radiation Oncology

## 2018-04-07 DIAGNOSIS — Z510 Encounter for antineoplastic radiation therapy: Secondary | ICD-10-CM | POA: Diagnosis not present

## 2018-04-07 DIAGNOSIS — C61 Malignant neoplasm of prostate: Secondary | ICD-10-CM | POA: Diagnosis not present

## 2018-04-08 ENCOUNTER — Ambulatory Visit
Admission: RE | Admit: 2018-04-08 | Discharge: 2018-04-08 | Disposition: A | Discharge status: Home / Self Care | Payer: Medicare HMO | Source: Ambulatory Visit | Attending: Radiation Oncology | Admitting: Radiation Oncology

## 2018-04-08 DIAGNOSIS — C61 Malignant neoplasm of prostate: Secondary | ICD-10-CM | POA: Diagnosis not present

## 2018-04-08 DIAGNOSIS — Z510 Encounter for antineoplastic radiation therapy: Secondary | ICD-10-CM | POA: Diagnosis not present

## 2018-04-11 ENCOUNTER — Ambulatory Visit
Admission: RE | Admit: 2018-04-11 | Discharge: 2018-04-11 | Disposition: A | Discharge status: Home / Self Care | Payer: Medicare HMO | Source: Ambulatory Visit | Attending: Radiation Oncology | Admitting: Radiation Oncology

## 2018-04-11 DIAGNOSIS — C61 Malignant neoplasm of prostate: Secondary | ICD-10-CM | POA: Diagnosis not present

## 2018-04-11 DIAGNOSIS — Z510 Encounter for antineoplastic radiation therapy: Secondary | ICD-10-CM | POA: Diagnosis not present

## 2018-04-12 ENCOUNTER — Ambulatory Visit
Admission: RE | Admit: 2018-04-12 | Discharge: 2018-04-12 | Disposition: A | Discharge status: Home / Self Care | Payer: Medicare HMO | Source: Ambulatory Visit | Attending: Radiation Oncology | Admitting: Radiation Oncology

## 2018-04-12 DIAGNOSIS — C61 Malignant neoplasm of prostate: Secondary | ICD-10-CM | POA: Insufficient documentation

## 2018-04-12 DIAGNOSIS — Z510 Encounter for antineoplastic radiation therapy: Secondary | ICD-10-CM | POA: Diagnosis not present

## 2018-04-13 ENCOUNTER — Ambulatory Visit
Admission: RE | Admit: 2018-04-13 | Discharge: 2018-04-13 | Disposition: A | Discharge status: Home / Self Care | Payer: Medicare HMO | Source: Ambulatory Visit | Attending: Radiation Oncology | Admitting: Radiation Oncology

## 2018-04-13 DIAGNOSIS — Z510 Encounter for antineoplastic radiation therapy: Secondary | ICD-10-CM | POA: Diagnosis not present

## 2018-04-13 DIAGNOSIS — C61 Malignant neoplasm of prostate: Secondary | ICD-10-CM | POA: Diagnosis not present

## 2018-04-14 ENCOUNTER — Ambulatory Visit
Admission: RE | Admit: 2018-04-14 | Discharge: 2018-04-14 | Disposition: A | Discharge status: Home / Self Care | Payer: Medicare HMO | Source: Ambulatory Visit | Attending: Radiation Oncology | Admitting: Radiation Oncology

## 2018-04-14 DIAGNOSIS — Z510 Encounter for antineoplastic radiation therapy: Secondary | ICD-10-CM | POA: Diagnosis not present

## 2018-04-14 DIAGNOSIS — C61 Malignant neoplasm of prostate: Secondary | ICD-10-CM | POA: Diagnosis not present

## 2018-04-15 ENCOUNTER — Ambulatory Visit
Admission: RE | Admit: 2018-04-15 | Discharge: 2018-04-15 | Disposition: A | Discharge status: Home / Self Care | Payer: Medicare HMO | Source: Ambulatory Visit | Attending: Radiation Oncology | Admitting: Radiation Oncology

## 2018-04-15 DIAGNOSIS — C61 Malignant neoplasm of prostate: Secondary | ICD-10-CM | POA: Diagnosis not present

## 2018-04-15 DIAGNOSIS — Z510 Encounter for antineoplastic radiation therapy: Secondary | ICD-10-CM | POA: Diagnosis not present

## 2018-04-18 ENCOUNTER — Ambulatory Visit
Admission: RE | Admit: 2018-04-18 | Discharge: 2018-04-18 | Disposition: A | Discharge status: Home / Self Care | Payer: Medicare HMO | Source: Ambulatory Visit | Attending: Radiation Oncology | Admitting: Radiation Oncology

## 2018-04-18 DIAGNOSIS — Z510 Encounter for antineoplastic radiation therapy: Secondary | ICD-10-CM | POA: Diagnosis not present

## 2018-04-18 DIAGNOSIS — C61 Malignant neoplasm of prostate: Secondary | ICD-10-CM | POA: Diagnosis not present

## 2018-04-19 ENCOUNTER — Ambulatory Visit
Admission: RE | Admit: 2018-04-19 | Discharge: 2018-04-19 | Disposition: A | Discharge status: Home / Self Care | Payer: Medicare HMO | Source: Ambulatory Visit | Attending: Radiation Oncology | Admitting: Radiation Oncology

## 2018-04-19 ENCOUNTER — Inpatient Hospital Stay: Payer: Medicare HMO | Attending: Radiation Oncology

## 2018-04-19 DIAGNOSIS — C61 Malignant neoplasm of prostate: Secondary | ICD-10-CM | POA: Insufficient documentation

## 2018-04-19 DIAGNOSIS — Z510 Encounter for antineoplastic radiation therapy: Secondary | ICD-10-CM | POA: Diagnosis not present

## 2018-04-19 LAB — CBC
HCT: 42.6 % (ref 39.0–52.0)
Hemoglobin: 14.5 g/dL (ref 13.0–17.0)
MCH: 31.7 pg (ref 26.0–34.0)
MCHC: 34 g/dL (ref 30.0–36.0)
MCV: 93.2 fL (ref 80.0–100.0)
PLATELETS: 241 10*3/uL (ref 150–400)
RBC: 4.57 MIL/uL (ref 4.22–5.81)
RDW: 11.9 % (ref 11.5–15.5)
WBC: 3.1 10*3/uL — ABNORMAL LOW (ref 4.0–10.5)
nRBC: 0 % (ref 0.0–0.2)

## 2018-04-20 ENCOUNTER — Ambulatory Visit
Admission: RE | Admit: 2018-04-20 | Discharge: 2018-04-20 | Disposition: A | Discharge status: Home / Self Care | Payer: Medicare HMO | Source: Ambulatory Visit | Attending: Radiation Oncology | Admitting: Radiation Oncology

## 2018-04-20 DIAGNOSIS — C61 Malignant neoplasm of prostate: Secondary | ICD-10-CM | POA: Diagnosis not present

## 2018-04-20 DIAGNOSIS — Z510 Encounter for antineoplastic radiation therapy: Secondary | ICD-10-CM | POA: Diagnosis not present

## 2018-04-21 ENCOUNTER — Ambulatory Visit
Admission: RE | Admit: 2018-04-21 | Discharge: 2018-04-21 | Disposition: A | Discharge status: Home / Self Care | Payer: Medicare HMO | Source: Ambulatory Visit | Attending: Radiation Oncology | Admitting: Radiation Oncology

## 2018-04-21 DIAGNOSIS — C61 Malignant neoplasm of prostate: Secondary | ICD-10-CM | POA: Diagnosis not present

## 2018-04-21 DIAGNOSIS — Z510 Encounter for antineoplastic radiation therapy: Secondary | ICD-10-CM | POA: Diagnosis not present

## 2018-04-22 ENCOUNTER — Ambulatory Visit
Admission: RE | Admit: 2018-04-22 | Discharge: 2018-04-22 | Disposition: A | Discharge status: Home / Self Care | Payer: Medicare HMO | Source: Ambulatory Visit | Attending: Radiation Oncology | Admitting: Radiation Oncology

## 2018-04-22 DIAGNOSIS — Z510 Encounter for antineoplastic radiation therapy: Secondary | ICD-10-CM | POA: Diagnosis not present

## 2018-04-22 DIAGNOSIS — C61 Malignant neoplasm of prostate: Secondary | ICD-10-CM | POA: Diagnosis not present

## 2018-04-25 ENCOUNTER — Ambulatory Visit
Admission: RE | Admit: 2018-04-25 | Discharge: 2018-04-25 | Disposition: A | Discharge status: Home / Self Care | Payer: Medicare HMO | Source: Ambulatory Visit | Attending: Radiation Oncology | Admitting: Radiation Oncology

## 2018-04-25 DIAGNOSIS — C61 Malignant neoplasm of prostate: Secondary | ICD-10-CM | POA: Diagnosis not present

## 2018-04-25 DIAGNOSIS — Z510 Encounter for antineoplastic radiation therapy: Secondary | ICD-10-CM | POA: Diagnosis not present

## 2018-04-26 ENCOUNTER — Ambulatory Visit
Admission: RE | Admit: 2018-04-26 | Discharge: 2018-04-26 | Disposition: A | Discharge status: Home / Self Care | Payer: Medicare HMO | Source: Ambulatory Visit | Attending: Radiation Oncology | Admitting: Radiation Oncology

## 2018-04-26 DIAGNOSIS — Z510 Encounter for antineoplastic radiation therapy: Secondary | ICD-10-CM | POA: Diagnosis not present

## 2018-04-26 DIAGNOSIS — C61 Malignant neoplasm of prostate: Secondary | ICD-10-CM | POA: Diagnosis not present

## 2018-04-27 ENCOUNTER — Ambulatory Visit
Admission: RE | Admit: 2018-04-27 | Discharge: 2018-04-27 | Disposition: A | Discharge status: Home / Self Care | Payer: Medicare HMO | Source: Ambulatory Visit | Attending: Radiation Oncology | Admitting: Radiation Oncology

## 2018-04-27 ENCOUNTER — Ambulatory Visit (INDEPENDENT_AMBULATORY_CARE_PROVIDER_SITE_OTHER): Payer: Medicare HMO

## 2018-04-27 DIAGNOSIS — Z510 Encounter for antineoplastic radiation therapy: Secondary | ICD-10-CM | POA: Diagnosis not present

## 2018-04-27 DIAGNOSIS — Z23 Encounter for immunization: Secondary | ICD-10-CM

## 2018-04-27 DIAGNOSIS — C61 Malignant neoplasm of prostate: Secondary | ICD-10-CM | POA: Diagnosis not present

## 2018-04-28 ENCOUNTER — Ambulatory Visit
Admission: RE | Admit: 2018-04-28 | Discharge: 2018-04-28 | Disposition: A | Discharge status: Home / Self Care | Payer: Medicare HMO | Source: Ambulatory Visit | Attending: Radiation Oncology | Admitting: Radiation Oncology

## 2018-04-28 DIAGNOSIS — Z510 Encounter for antineoplastic radiation therapy: Secondary | ICD-10-CM | POA: Diagnosis not present

## 2018-04-28 DIAGNOSIS — C61 Malignant neoplasm of prostate: Secondary | ICD-10-CM | POA: Diagnosis not present

## 2018-04-29 ENCOUNTER — Ambulatory Visit
Admission: RE | Admit: 2018-04-29 | Discharge: 2018-04-29 | Disposition: A | Discharge status: Home / Self Care | Payer: Medicare HMO | Source: Ambulatory Visit | Attending: Radiation Oncology | Admitting: Radiation Oncology

## 2018-04-29 DIAGNOSIS — C61 Malignant neoplasm of prostate: Secondary | ICD-10-CM | POA: Diagnosis not present

## 2018-04-29 DIAGNOSIS — Z510 Encounter for antineoplastic radiation therapy: Secondary | ICD-10-CM | POA: Diagnosis not present

## 2018-05-02 ENCOUNTER — Ambulatory Visit
Admission: RE | Admit: 2018-05-02 | Discharge: 2018-05-02 | Disposition: A | Discharge status: Home / Self Care | Payer: Medicare HMO | Source: Ambulatory Visit | Attending: Radiation Oncology | Admitting: Radiation Oncology

## 2018-05-02 DIAGNOSIS — C61 Malignant neoplasm of prostate: Secondary | ICD-10-CM | POA: Diagnosis not present

## 2018-05-02 DIAGNOSIS — Z510 Encounter for antineoplastic radiation therapy: Secondary | ICD-10-CM | POA: Diagnosis not present

## 2018-05-03 ENCOUNTER — Inpatient Hospital Stay: Payer: Medicare HMO

## 2018-05-03 ENCOUNTER — Ambulatory Visit
Admission: RE | Admit: 2018-05-03 | Discharge: 2018-05-03 | Disposition: A | Discharge status: Home / Self Care | Payer: Medicare HMO | Source: Ambulatory Visit | Attending: Radiation Oncology | Admitting: Radiation Oncology

## 2018-05-03 DIAGNOSIS — C61 Malignant neoplasm of prostate: Secondary | ICD-10-CM

## 2018-05-03 DIAGNOSIS — Z510 Encounter for antineoplastic radiation therapy: Secondary | ICD-10-CM | POA: Diagnosis not present

## 2018-05-03 LAB — CBC
HEMATOCRIT: 41.2 % (ref 39.0–52.0)
HEMOGLOBIN: 13.9 g/dL (ref 13.0–17.0)
MCH: 31.5 pg (ref 26.0–34.0)
MCHC: 33.7 g/dL (ref 30.0–36.0)
MCV: 93.4 fL (ref 80.0–100.0)
Platelets: 295 10*3/uL (ref 150–400)
RBC: 4.41 MIL/uL (ref 4.22–5.81)
RDW: 11.9 % (ref 11.5–15.5)
WBC: 3.1 10*3/uL — ABNORMAL LOW (ref 4.0–10.5)
nRBC: 0 % (ref 0.0–0.2)

## 2018-05-04 ENCOUNTER — Ambulatory Visit
Admission: RE | Admit: 2018-05-04 | Discharge: 2018-05-04 | Disposition: A | Discharge status: Home / Self Care | Payer: Medicare HMO | Source: Ambulatory Visit | Attending: Radiation Oncology | Admitting: Radiation Oncology

## 2018-05-04 DIAGNOSIS — C61 Malignant neoplasm of prostate: Secondary | ICD-10-CM | POA: Diagnosis not present

## 2018-05-04 DIAGNOSIS — Z510 Encounter for antineoplastic radiation therapy: Secondary | ICD-10-CM | POA: Diagnosis not present

## 2018-05-05 ENCOUNTER — Ambulatory Visit
Admission: RE | Admit: 2018-05-05 | Discharge: 2018-05-05 | Disposition: A | Discharge status: Home / Self Care | Payer: Medicare HMO | Source: Ambulatory Visit | Attending: Radiation Oncology | Admitting: Radiation Oncology

## 2018-05-05 DIAGNOSIS — Z510 Encounter for antineoplastic radiation therapy: Secondary | ICD-10-CM | POA: Diagnosis not present

## 2018-05-05 DIAGNOSIS — C61 Malignant neoplasm of prostate: Secondary | ICD-10-CM | POA: Diagnosis not present

## 2018-05-06 ENCOUNTER — Ambulatory Visit
Admission: RE | Admit: 2018-05-06 | Discharge: 2018-05-06 | Disposition: A | Discharge status: Home / Self Care | Payer: Medicare HMO | Source: Ambulatory Visit | Attending: Radiation Oncology | Admitting: Radiation Oncology

## 2018-05-06 DIAGNOSIS — Z510 Encounter for antineoplastic radiation therapy: Secondary | ICD-10-CM | POA: Diagnosis not present

## 2018-05-06 DIAGNOSIS — C61 Malignant neoplasm of prostate: Secondary | ICD-10-CM | POA: Diagnosis not present

## 2018-05-09 ENCOUNTER — Ambulatory Visit
Admission: RE | Admit: 2018-05-09 | Discharge: 2018-05-09 | Disposition: A | Discharge status: Home / Self Care | Payer: Medicare HMO | Source: Ambulatory Visit | Attending: Radiation Oncology | Admitting: Radiation Oncology

## 2018-05-09 DIAGNOSIS — C61 Malignant neoplasm of prostate: Secondary | ICD-10-CM | POA: Diagnosis not present

## 2018-05-09 DIAGNOSIS — Z510 Encounter for antineoplastic radiation therapy: Secondary | ICD-10-CM | POA: Diagnosis not present

## 2018-05-10 ENCOUNTER — Ambulatory Visit
Admission: RE | Admit: 2018-05-10 | Discharge: 2018-05-10 | Disposition: A | Discharge status: Home / Self Care | Payer: Medicare HMO | Source: Ambulatory Visit | Attending: Radiation Oncology | Admitting: Radiation Oncology

## 2018-05-10 DIAGNOSIS — Z510 Encounter for antineoplastic radiation therapy: Secondary | ICD-10-CM | POA: Diagnosis not present

## 2018-05-10 DIAGNOSIS — C61 Malignant neoplasm of prostate: Secondary | ICD-10-CM | POA: Diagnosis not present

## 2018-05-11 ENCOUNTER — Ambulatory Visit
Admission: RE | Admit: 2018-05-11 | Discharge: 2018-05-11 | Disposition: A | Discharge status: Home / Self Care | Payer: Medicare HMO | Source: Ambulatory Visit | Attending: Radiation Oncology | Admitting: Radiation Oncology

## 2018-05-11 DIAGNOSIS — C61 Malignant neoplasm of prostate: Secondary | ICD-10-CM | POA: Diagnosis not present

## 2018-05-11 DIAGNOSIS — Z510 Encounter for antineoplastic radiation therapy: Secondary | ICD-10-CM | POA: Diagnosis not present

## 2018-05-12 ENCOUNTER — Ambulatory Visit
Admission: RE | Admit: 2018-05-12 | Discharge: 2018-05-12 | Disposition: A | Discharge status: Home / Self Care | Payer: Medicare HMO | Source: Ambulatory Visit | Attending: Radiation Oncology | Admitting: Radiation Oncology

## 2018-05-12 DIAGNOSIS — Z510 Encounter for antineoplastic radiation therapy: Secondary | ICD-10-CM | POA: Diagnosis not present

## 2018-05-12 DIAGNOSIS — C61 Malignant neoplasm of prostate: Secondary | ICD-10-CM | POA: Diagnosis not present

## 2018-06-13 DIAGNOSIS — H401132 Primary open-angle glaucoma, bilateral, moderate stage: Secondary | ICD-10-CM | POA: Diagnosis not present

## 2018-06-22 ENCOUNTER — Other Ambulatory Visit: Payer: Self-pay

## 2018-06-22 ENCOUNTER — Encounter: Payer: Self-pay | Admitting: Radiation Oncology

## 2018-06-22 ENCOUNTER — Other Ambulatory Visit: Payer: Self-pay | Admitting: *Deleted

## 2018-06-22 ENCOUNTER — Ambulatory Visit
Admission: RE | Admit: 2018-06-22 | Discharge: 2018-06-22 | Disposition: A | Discharge status: Home / Self Care | Payer: Medicare HMO | Source: Ambulatory Visit | Attending: Radiation Oncology | Admitting: Radiation Oncology

## 2018-06-22 ENCOUNTER — Other Ambulatory Visit: Payer: Self-pay | Admitting: Family Medicine

## 2018-06-22 VITALS — BP 141/91 | HR 93 | Temp 97.4°F | Resp 18 | Wt 187.4 lb

## 2018-06-22 DIAGNOSIS — Z923 Personal history of irradiation: Secondary | ICD-10-CM | POA: Insufficient documentation

## 2018-06-22 DIAGNOSIS — C61 Malignant neoplasm of prostate: Secondary | ICD-10-CM | POA: Diagnosis not present

## 2018-06-22 DIAGNOSIS — I10 Essential (primary) hypertension: Secondary | ICD-10-CM

## 2018-06-22 NOTE — Progress Notes (Signed)
Radiation Oncology Follow up Note  Name: Ernest Sweeney   Date:   06/22/2018 MRN:  [id number] DOB: 1951-06-12    This 67 y.o. male presents to the clinic today for one-month follow-up status post salvage radiation therapy after robotic prostatectomy for Gleason 7 stage IIIB adenocarcinoma the prostate.  REFERRING PROVIDER: [name], MD  HPI: patient is a 67 year old male now seen out 1 month having completed salvage radiation therapy to his prostatic fossa for Gleason 7 (3+4) adenocarcinoma the prostate stage IIIa (T3 N0 M0). Seen today in routine follow-up is doing well still has some intermittent loose bowels not following anylow residue diet. Hasmostly nocturia 3-4 no significant urinary frequency during the day..  COMPLICATIONS OF TREATMENT: none  FOLLOW UP COMPLIANCE: keeps appointments   PHYSICAL EXAM:  BP (!) 141/91 (Patient Position: Sitting)   Pulse 93   Temp (!) 97.4 F (36.3 C) (Tympanic)   Resp 18   Wt 187 lb 6.3 oz (85 kg)   BMI 27.67 kg/m  Well-developed well-nourished patient in NAD. HEENT reveals PERLA, EOMI, discs not visualized.  Oral cavity is clear. No oral mucosal lesions are identified. Neck is clear without evidence of cervical or supraclavicular adenopathy. Lungs are clear to A&P. Cardiac examination is essentially unremarkable with regular rate and rhythm without murmur rub or thrill. Abdomen is benign with no organomegaly or masses noted. Motor sensory and DTR levels are equal and symmetric in the upper and lower extremities. Cranial nerves II through XII are grossly intact. Proprioception is intact. No peripheral adenopathy or edema is identified. No motor or sensory levels are noted. Crude visual fields are within normal range.  RADIOLOGY RESULTS: no current films for review  PLAN: post time patient is doing well from a side effect profile. I'm please was overall progress. We will discontinue and discharge deprivation therapy. I've asked to see him  back in 3-4 months and will have a PSA prior to that visit. Patient is to call sooner with any concerns.  I would like to take this opportunity to thank you for allowing me to participate in the care of your patient.Noreene Filbert, MD

## 2018-06-27 ENCOUNTER — Telehealth: Payer: Self-pay | Admitting: *Deleted

## 2018-06-27 DIAGNOSIS — Z122 Encounter for screening for malignant neoplasm of respiratory organs: Secondary | ICD-10-CM

## 2018-06-27 DIAGNOSIS — Z87891 Personal history of nicotine dependence: Secondary | ICD-10-CM

## 2018-06-27 NOTE — Telephone Encounter (Signed)
Patient has been notified that annual lung cancer screening low dose CT scan is due currently or will be in near future. Note, delay was related to evaluation of status of prostate cancer. Confirmed that patient is within the age range of 55-77, and asymptomatic, (no signs or symptoms of lung cancer). Patient denies illness that would prevent curative treatment for lung cancer if found. Verified smoking history, (former, quit 03/23/16, 30 pack year). The shared decision making visit was done 04/20/17. Patient is agreeable for CT scan being scheduled.

## 2018-06-28 ENCOUNTER — Telehealth: Payer: Self-pay | Admitting: *Deleted

## 2018-06-28 NOTE — Telephone Encounter (Signed)
Called pt to inform them of appt for LDCT screening scan on 07-20-2018 @ 1010 am at [facility] message left for patient on cell phone in emr and appt mailed to address listed.

## 2018-07-18 ENCOUNTER — Encounter: Payer: Self-pay | Admitting: Family Medicine

## 2018-07-18 ENCOUNTER — Ambulatory Visit (INDEPENDENT_AMBULATORY_CARE_PROVIDER_SITE_OTHER): Payer: Medicare HMO | Admitting: Family Medicine

## 2018-07-18 VITALS — BP 120/90 | HR 105 | Temp 98.4°F | Resp 16 | Ht 69.0 in | Wt 184.1 lb

## 2018-07-18 DIAGNOSIS — F5102 Adjustment insomnia: Secondary | ICD-10-CM

## 2018-07-18 DIAGNOSIS — D708 Other neutropenia: Secondary | ICD-10-CM

## 2018-07-18 DIAGNOSIS — I10 Essential (primary) hypertension: Secondary | ICD-10-CM

## 2018-07-18 DIAGNOSIS — J410 Simple chronic bronchitis: Secondary | ICD-10-CM | POA: Diagnosis not present

## 2018-07-18 DIAGNOSIS — E559 Vitamin D deficiency, unspecified: Secondary | ICD-10-CM | POA: Diagnosis not present

## 2018-07-18 DIAGNOSIS — F331 Major depressive disorder, recurrent, moderate: Secondary | ICD-10-CM

## 2018-07-18 DIAGNOSIS — C61 Malignant neoplasm of prostate: Secondary | ICD-10-CM

## 2018-07-18 DIAGNOSIS — I700 Atherosclerosis of aorta: Secondary | ICD-10-CM | POA: Diagnosis not present

## 2018-07-18 DIAGNOSIS — R69 Illness, unspecified: Secondary | ICD-10-CM | POA: Diagnosis not present

## 2018-07-18 MED ORDER — SERTRALINE HCL 50 MG PO TABS: 50.0000 mg | ORAL_TABLET | Freq: Every day | ORAL | 1 refills | Status: DC

## 2018-07-18 MED ORDER — ATORVASTATIN CALCIUM 40 MG PO TABS: 40.0000 mg | ORAL_TABLET | Freq: Every day | ORAL | 1 refills | Status: DC

## 2018-07-18 MED ORDER — TRAZODONE HCL 100 MG PO TABS: 100.0000 mg | ORAL_TABLET | Freq: Every day | ORAL | 0 refills | Status: DC

## 2018-07-18 MED ORDER — UMECLIDINIUM-VILANTEROL 62.5-25 MCG/INH IN AEPB: 1.0000 | INHALATION_SPRAY | Freq: Every day | RESPIRATORY_TRACT | 2 refills | Status: DC

## 2018-07-18 NOTE — Progress Notes (Signed)
Name: Ernest Sweeney   MRN: [id number]    DOB: 03/19/1951   Date:07/18/2018       Progress Note  Subjective  Chief Complaint  Chief Complaint  Patient presents with  . Hypertension  . Medication Refill  . Hyperlipidemia    HPI   HTN: he is doing well, bp today showed DBP at 90 but usually at goal and we will continue to monitor for now. He denies dizziness, chest pain or palpitation. He is compliant with medication  Hyperlipidemia: taking Atorvastatin 40 mg , no myalgias , we will recheck labs next visit it was at goal back in June 2019 .   Prostate Cancer :s/p roboticprostatectomy done by Dr. Pilar Jarvis on 07/15/2016. Gleason 3+4 . Having ED but controlled with medication, last PSA normal , on Lupron and having hot flashes   Hyperglycemia: glucose 101 back 07/2016, he states occasionally has polydipsia, no polyphagia or polyuriaNormal hgbA1C 01/2017, and normal fasting glucose in 12/2017 , recheck next visit   Chronic bronchitis : he quit smoking 2017.  CT chest showed centrilobular emphysema 04/2017, he was doing well, however had URI a few weeks ago and has noticed worsening of cough that is productive at times, also some wheezing and SOB and is willing to start medication now  Coronary artery disease: on high dose statin and aspirin, negative stress test done by Dr. Fletcher Anon 08/2017 . Continue medications   Aorta atherosclerosis: needs high dose statin therapy and continue aspirin. Unchanged  Major Depression: he has a long history of depression, but worse since diagnosed with prostate cancer and much worse over the past year, he states feels lonely, frustrated that he cannot work, and worried about prostate cancer. His phq 9 is high and is willing to try medication   Patient Active Problem List   Diagnosis Date Noted  . Chronic bronchitis ([place]) 04/23/2017  . Atherosclerosis of aorta ([place]) 04/23/2017  . Coronary artery disease due to calcified coronary lesion 04/23/2017  .  Glaucoma, left eye 08/04/2016  . Prostate cancer ([place]) 07/15/2016  . History of prostatectomy 07/15/2016  . Vitamin D deficiency 05/31/2015  . Hyperlipidemia LDL goal <100 05/31/2015  . Seasonal allergic rhinitis 05/31/2015  . Chronic pain of both shoulders 05/31/2015  . Family history of malignant neoplasm of prostate 07/26/2012  . Hematuria, microscopic 07/26/2012    Past Surgical History:  Procedure Laterality Date  . COLONOSCOPY  2010   Dr Sula Rumple  . excision skin cyst  06-19-14   Dr. Festus Aloe CYST  . PELVIC LYMPH NODE DISSECTION N/A 07/15/2016   Procedure: PELVIC LYMPH NODE DISSECTION;  Surgeon: Nickie Retort, MD;  Location: [facility] ORS;  Service: Urology;  Laterality: N/A;  . ROBOT ASSISTED LAPAROSCOPIC RADICAL PROSTATECTOMY N/A 07/15/2016   Procedure: ROBOTIC ASSISTED LAPAROSCOPIC RADICAL PROSTATECTOMY;  Surgeon: Nickie Retort, MD;  Location: [facility] ORS;  Service: Urology;  Laterality: N/A;    Family History  Problem Relation Age of Onset  . Heart disease Mother   . Heart attack Mother   . Diabetes Father   . Heart attack Father   . Cancer Sister        Breast  . Cancer Brother        Prostate  . Healthy Sister   . Stroke Brother     Social History   Socioeconomic History  . Marital status: Widowed    Spouse name: Not on file  . Number of children: 0  . Years of education: Not on file  .  Highest education level: 12th grade  Occupational History  . Occupation: Retired  Scientific laboratory technician  . Financial resource strain: Somewhat hard  . Food insecurity:    Worry: Sometimes true    Inability: Sometimes true  . Transportation needs:    Medical: No    Non-medical: No  Tobacco Use  . Smoking status: Former Smoker    Packs/day: 0.50    Years: 40.00    Pack years: 20.00    Types: Cigarettes    Last attempt to quit: 03/23/2016    Years since quitting: 2.3  . Smokeless tobacco: Never Used  . Tobacco comment: smoking cessation materials not required   Substance and Sexual Activity  . Alcohol use: Yes    Alcohol/week: 0.0 standard drinks    Comment: social  . Drug use: No  . Sexual activity: Not Currently    Birth control/protection: Condom  Lifestyle  . Physical activity:    Days per week: 0 days    Minutes per session: 0 min  . Stress: Not at all  Relationships  . Social connections:    Talks on phone: Patient refused    Gets together: Patient refused    Attends religious service: Patient refused    Active member of club or organization: Patient refused    Attends meetings of clubs or organizations: Patient refused    Relationship status: Widowed  . Intimate partner violence:    Fear of current or ex partner: No    Emotionally abused: No    Physically abused: No    Forced sexual activity: No  Other Topics Concern  . Not on file  Social History Narrative  . Not on file     Current Outpatient Medications:  .  aspirin 81 MG tablet, Take 81 mg by mouth daily., Disp: , Rfl:  .  atorvastatin (LIPITOR) 40 MG tablet, Take 1 tablet (40 mg total) by mouth daily. New cholesterol medication, Disp: 90 tablet, Rfl: 1 .  Cholecalciferol (VITAMIN D) 2000 units CAPS, Take 1 capsule (2,000 Units total) by mouth daily., Disp: 30 capsule, Rfl: 0 .  latanoprost (XALATAN) 0.005 % ophthalmic solution, Place 1 drop into both eyes at bedtime. , Disp: , Rfl:  .  lisinopril (PRINIVIL,ZESTRIL) 20 MG tablet, TAKE 1 TABLET BY MOUTH ONCE DAILY, Disp: 90 tablet, Rfl: 0 .  sildenafil (REVATIO) 20 MG tablet, Take 1 to 5 tablets PO daily prn, Disp: 30 tablet, Rfl: 3 .  traZODone (DESYREL) 100 MG tablet, Take 1 tablet (100 mg total) by mouth at bedtime., Disp: 90 tablet, Rfl: 0 .  triamcinolone cream (KENALOG) 0.1 %, Apply 1 application topically 2 (two) times daily., Disp: 45 g, Rfl: 0 .  sertraline (ZOLOFT) 50 MG tablet, Take 1 tablet (50 mg total) by mouth daily., Disp: 30 tablet, Rfl: 1 .  umeclidinium-vilanterol (ANORO ELLIPTA) 62.5-25 MCG/INH AEPB,  Inhale 1 puff into the lungs daily., Disp: 60 each, Rfl: 2  No Known Allergies  I personally reviewed active problem list, medication list, allergies, family history, social history, health maintenance with the patient/caregiver today.   ROS  Constitutional: Negative for fever or weight change.  Respiratory: positive  for cough but no shortness of breath.   Cardiovascular: Negative for chest pain or palpitations.  Gastrointestinal: Negative for abdominal pain, no bowel changes.  Musculoskeletal: Negative for gait problem or joint swelling.  Skin: Negative for rash.  Neurological: Negative for dizziness or headache.  No other specific complaints in a complete review of systems (except  as listed in HPI above).  Objective  Vitals:   07/18/18 1029  BP: 120/90  Pulse: (!) 105  Resp: 16  Temp: 98.4 F (36.9 C)  TempSrc: Oral  SpO2: 98%  Weight: 184 lb 1.6 oz (83.5 kg)  Height: 5\' 9"  (1.753 m)    Body mass index is 27.19 kg/m.  Physical Exam  Constitutional: Patient appears well-developed and well-nourished. Overweight. No distress.  HEENT: head atraumatic, normocephalic, pupils equal and reactive to light, neck supple, throat within normal limits Cardiovascular: Normal rate, regular rhythm and normal heart sounds.  No murmur heard. No BLE edema. Pulmonary/Chest: Effort normal and breath sounds normal. No respiratory distress. Abdominal: Soft.  There is no tenderness. Psychiatric: Patient has a normal mood and affect. behavior is normal. Judgment and thought content normal.  Recent Results (from the past 2160 hour(s))  CBC     Status: Abnormal   Collection Time: 04/19/18  1:40 PM  Result Value Ref Range   WBC 3.1 (L) 4.0 - 10.5 K/uL   RBC 4.57 4.22 - 5.81 MIL/uL   Hemoglobin 14.5 13.0 - 17.0 g/dL   HCT 42.6 39.0 - 52.0 %   MCV 93.2 80.0 - 100.0 fL   MCH 31.7 26.0 - 34.0 pg   MCHC 34.0 30.0 - 36.0 g/dL   RDW 11.9 11.5 - 15.5 %   Platelets 241 150 - 400 K/uL   nRBC  0.0 0.0 - 0.2 %    Comment: Performed at [facility], [address] [place], [place] [address]  CBC     Status: Abnormal   Collection Time: 05/03/18  1:43 PM  Result Value Ref Range   WBC 3.1 (L) 4.0 - 10.5 K/uL   RBC 4.41 4.22 - 5.81 MIL/uL   Hemoglobin 13.9 13.0 - 17.0 g/dL   HCT 41.2 39.0 - 52.0 %   MCV 93.4 80.0 - 100.0 fL   MCH 31.5 26.0 - 34.0 pg   MCHC 33.7 30.0 - 36.0 g/dL   RDW 11.9 11.5 - 15.5 %   Platelets 295 150 - 400 K/uL   nRBC 0.0 0.0 - 0.2 %    Comment: Performed at [facility], [place], [place] [address]      PHQ2/9: Depression screen [facility] 2/9 07/18/2018 02/11/2018 01/05/2018 02/05/2017 08/04/2016  Decreased Interest 2 1 0 0 1  Down, Depressed, Hopeless 3 1 - 0 2  PHQ - 2 Score 5 2 0 0 3  Altered sleeping 3 1 - - 0  Tired, decreased energy 3 1 - - 0  Change in appetite 1 1 - - 0  Feeling bad or failure about yourself  1 3 - - 0  Trouble concentrating 0 1 - - 0  Moving slowly or fidgety/restless 0 1 - - 0  Suicidal thoughts 0 - - - 0  PHQ-9 Score 13 10 - - 3  Difficult doing work/chores Very difficult Somewhat difficult - - Somewhat difficult     Fall Risk: Fall Risk  07/18/2018 02/11/2018 01/05/2018 05/07/2017 02/05/2017  Falls in the past year? 0 No No No No  Number falls in past yr: 0 - - - -  Injury with Fall? 0 - - - -  Risk for fall due to : - Impaired vision - - -  Risk for fall due to: Comment - wears eyeglasses - - -     Functional Status Survey: Is the patient deaf or have difficulty hearing?: No Does the patient have  difficulty seeing, even when wearing glasses/contacts?: No Does the patient have difficulty concentrating, remembering, or making decisions?: No Does the patient have difficulty walking or climbing stairs?: No Does the patient have difficulty dressing or bathing?: No Does the patient have difficulty doing errands alone such as visiting a doctor's office or shopping?: No    Assessment & Plan  1.  Atherosclerosis of aorta ([facility])  - atorvastatin (LIPITOR) 40 MG tablet; Take 1 tablet (40 mg total) by mouth daily. New cholesterol medication  Dispense: 90 tablet; Refill: 1  2. Essential hypertension  bp is at goal today   3. Simple chronic bronchitis ([place])  He has an URI , and is coughing more he is willing to start Anoro  4. Vitamin D deficiency  Advised otc supplementation  5. Prostate cancer [facility])  Under the care of Dr. Baruch Gouty and Urologist   6. Other neutropenia ([place])  Recheck next visit   7. Moderate episode of recurrent major depressive disorder (HCC)  - sertraline (ZOLOFT) 50 MG tablet; Take 1 tablet (50 mg total) by mouth daily.  Dispense: 30 tablet; Refill: 1  8. Adjustment insomnia  - traZODone (DESYREL) 100 MG tablet; Take 1 tablet (100 mg total) by mouth at bedtime.  Dispense: 90 tablet; Refill: 0

## 2018-07-20 ENCOUNTER — Telehealth: Payer: Self-pay | Admitting: *Deleted

## 2018-07-20 ENCOUNTER — Ambulatory Visit
Admission: RE | Admit: 2018-07-20 | Discharge: 2018-07-20 | Disposition: A | Discharge status: Home / Self Care | Payer: Medicare HMO | Source: Ambulatory Visit | Attending: Oncology | Admitting: Oncology

## 2018-07-20 ENCOUNTER — Other Ambulatory Visit: Payer: Self-pay | Admitting: Family Medicine

## 2018-07-20 DIAGNOSIS — J181 Lobar pneumonia, unspecified organism: Secondary | ICD-10-CM

## 2018-07-20 DIAGNOSIS — Z87891 Personal history of nicotine dependence: Secondary | ICD-10-CM | POA: Insufficient documentation

## 2018-07-20 DIAGNOSIS — Z122 Encounter for screening for malignant neoplasm of respiratory organs: Secondary | ICD-10-CM | POA: Diagnosis not present

## 2018-07-20 MED ORDER — AMOXICILLIN-POT CLAVULANATE 875-125 MG PO TABS: 1.0000 | ORAL_TABLET | Freq: Two times a day (BID) | ORAL | 0 refills | Status: DC

## 2018-07-20 NOTE — Telephone Encounter (Signed)
Notified patient of LDCT lung cancer screening program results with recommendation for (*) month follow up imaging.  Also notified of incidental findings noted below and is encouraged to discuss further questions with PCP who will receive a copy of this not and/or the CT reports.  Patient verbalized understanding.     IMPRESSION: 1. New areas of peribronchovascular ground-glass consolidation in both lower lobes are most likely due to an infectious bronchiolitis/bronchopneumonia. Lung-RADS 0, incomplete. Short-term follow-up in 4-6 weeks is recommended with repeat low-dose screening chest CT without contrast (please use the following order, "CT CHEST LCS NODULE FOLLOW-UP W/O CM"). These results will be called to the ordering clinician or representative by the Radiologist Assistant, and communication documented in the PACS or zVision Dashboard. 2. Aortic atherosclerosis (ICD10-170.0). Coronary artery calcification. 3.  Emphysema (ICD10-J43.9).  Patient reports having a "COLD" lately and not feeling well.  Information will be sent to Dr. Ancil Boozer PCP and patient was encouraged to call PCP tomorrow if he didn't hear from them to see about getting medication.  Voiced understanding.

## 2018-08-22 ENCOUNTER — Other Ambulatory Visit: Payer: Medicare HMO

## 2018-08-24 ENCOUNTER — Other Ambulatory Visit: Payer: Medicare HMO

## 2018-08-24 DIAGNOSIS — C61 Malignant neoplasm of prostate: Secondary | ICD-10-CM | POA: Diagnosis not present

## 2018-08-25 ENCOUNTER — Ambulatory Visit: Payer: Medicare HMO | Admitting: Urology

## 2018-08-25 LAB — PSA: Prostate Specific Ag, Serum: <0.1 ng/mL (ref 0.0–4.0)

## 2018-08-29 ENCOUNTER — Encounter: Payer: Self-pay | Admitting: Family Medicine

## 2018-08-29 ENCOUNTER — Ambulatory Visit (INDEPENDENT_AMBULATORY_CARE_PROVIDER_SITE_OTHER): Payer: Medicare HMO | Admitting: Family Medicine

## 2018-08-29 ENCOUNTER — Other Ambulatory Visit: Payer: Self-pay | Admitting: *Deleted

## 2018-08-29 VITALS — BP 122/90 | HR 95 | Temp 97.6°F | Resp 16 | Ht 69.0 in | Wt 184.7 lb

## 2018-08-29 DIAGNOSIS — F331 Major depressive disorder, recurrent, moderate: Secondary | ICD-10-CM | POA: Diagnosis not present

## 2018-08-29 DIAGNOSIS — J410 Simple chronic bronchitis: Secondary | ICD-10-CM

## 2018-08-29 DIAGNOSIS — R69 Illness, unspecified: Secondary | ICD-10-CM | POA: Diagnosis not present

## 2018-08-29 DIAGNOSIS — Z87891 Personal history of nicotine dependence: Secondary | ICD-10-CM

## 2018-08-29 DIAGNOSIS — Z122 Encounter for screening for malignant neoplasm of respiratory organs: Secondary | ICD-10-CM

## 2018-08-29 DIAGNOSIS — R918 Other nonspecific abnormal finding of lung field: Secondary | ICD-10-CM

## 2018-08-29 DIAGNOSIS — I10 Essential (primary) hypertension: Secondary | ICD-10-CM

## 2018-08-29 MED ORDER — SERTRALINE HCL 50 MG PO TABS: 50.0000 mg | ORAL_TABLET | Freq: Every day | ORAL | 1 refills | Status: DC

## 2018-08-29 MED ORDER — LISINOPRIL 40 MG PO TABS: 40.0000 mg | ORAL_TABLET | Freq: Every day | ORAL | 0 refills | Status: DC

## 2018-08-29 NOTE — Progress Notes (Signed)
Name: Ernest Sweeney   MRN: [id number]    DOB: [birth year]   Date:08/29/2018       Progress Note  Subjective  Chief Complaint  Chief Complaint  Patient presents with  . Depression    HPI  Depression: seen January 2020 and phq 9 was very high at 34, he has a long history of depression but was feeling more down and hopeless, we started him on Zofot 50 mg he has not been taking it consistently, he states forgets to take it at night, advised to take it with other medications to increase compliance. He states mood is better even though he recently lost his father ( 68 yo) . No crying spells, no suicidal thoughts or ideation  HTN: DBP still elevated, no chest pain or palpitation. We will increase dose of lisinopril today, may need sleep study if no improvement. Snores occasionally only  Chronic bronchitis: he is still coughing but not daily, cannot afford Breo, he stopped medication on his own, quit smoking in 2016. No sob or wheezing, Advised to walk more often.   Patient Active Problem List   Diagnosis Date Noted  . Chronic bronchitis ([place]) 04/23/2017  . Atherosclerosis of aorta ([place]) 04/23/2017  . Coronary artery disease due to calcified coronary lesion 04/23/2017  . Glaucoma, left eye 08/04/2016  . Prostate cancer ([place]) 07/15/2016  . History of prostatectomy 07/15/2016  . Vitamin D deficiency 05/31/2015  . Hyperlipidemia LDL goal <100 05/31/2015  . Seasonal allergic rhinitis 05/31/2015  . Chronic pain of both shoulders 05/31/2015  . Family history of malignant neoplasm of prostate 07/26/2012  . Hematuria, microscopic 07/26/2012    Past Surgical History:  Procedure Laterality Date  . COLONOSCOPY  2010   Dr Sula Rumple  . excision skin cyst  06-19-14   Dr. Festus Aloe CYST  . PELVIC LYMPH NODE DISSECTION N/A 07/15/2016   Procedure: PELVIC LYMPH NODE DISSECTION;  Surgeon: Nickie Retort, MD;  Location: [facility] ORS;  Service: Urology;  Laterality: N/A;  . ROBOT ASSISTED  LAPAROSCOPIC RADICAL PROSTATECTOMY N/A 07/15/2016   Procedure: ROBOTIC ASSISTED LAPAROSCOPIC RADICAL PROSTATECTOMY;  Surgeon: Nickie Retort, MD;  Location: [facility] ORS;  Service: Urology;  Laterality: N/A;    Family History  Problem Relation Age of Onset  . Heart disease Mother   . Heart attack Mother   . Diabetes Father   . Heart attack Father   . Dementia Father   . Cancer Sister        Breast  . Cancer Brother        Prostate  . Healthy Sister   . Stroke Brother     Social History   Socioeconomic History  . Marital status: Widowed    Spouse name: Not on file  . Number of children: 0  . Years of education: Not on file  . Highest education level: 12th grade  Occupational History  . Occupation: Retired  Scientific laboratory technician  . Financial resource strain: Somewhat hard  . Food insecurity:    Worry: Sometimes true    Inability: Sometimes true  . Transportation needs:    Medical: No    Non-medical: No  Tobacco Use  . Smoking status: Former Smoker    Packs/day: 0.50    Years: 40.00    Pack years: 20.00    Types: Cigarettes    Last attempt to quit: 03/23/2016    Years since quitting: 2.4  . Smokeless tobacco: Never Used  . Tobacco comment: smoking cessation materials not required  Substance and Sexual Activity  . Alcohol use: Yes    Alcohol/week: 0.0 standard drinks    Comment: social  . Drug use: No  . Sexual activity: Not Currently    Birth control/protection: Condom  Lifestyle  . Physical activity:    Days per week: 0 days    Minutes per session: 0 min  . Stress: Not at all  Relationships  . Social connections:    Talks on phone: Patient refused    Gets together: Patient refused    Attends religious service: Patient refused    Active member of club or organization: Patient refused    Attends meetings of clubs or organizations: Patient refused    Relationship status: Widowed  . Intimate partner violence:    Fear of current or ex partner: No    Emotionally  abused: No    Physically abused: No    Forced sexual activity: No  Other Topics Concern  . Not on file  Social History Narrative  . Not on file     Current Outpatient Medications:  .  aspirin 81 MG tablet, Take 81 mg by mouth daily., Disp: , Rfl:  .  atorvastatin (LIPITOR) 40 MG tablet, Take 1 tablet (40 mg total) by mouth daily. New cholesterol medication, Disp: 90 tablet, Rfl: 1 .  Cholecalciferol (VITAMIN D) 2000 units CAPS, Take 1 capsule (2,000 Units total) by mouth daily., Disp: 30 capsule, Rfl: 0 .  latanoprost (XALATAN) 0.005 % ophthalmic solution, Place 1 drop into both eyes at bedtime. , Disp: , Rfl:  .  lisinopril (PRINIVIL,ZESTRIL) 40 MG tablet, Take 1 tablet (40 mg total) by mouth daily., Disp: 90 tablet, Rfl: 0 .  sertraline (ZOLOFT) 50 MG tablet, Take 1 tablet (50 mg total) by mouth daily., Disp: 90 tablet, Rfl: 1 .  sildenafil (REVATIO) 20 MG tablet, Take 1 to 5 tablets PO daily prn, Disp: 30 tablet, Rfl: 3 .  traZODone (DESYREL) 100 MG tablet, Take 1 tablet (100 mg total) by mouth at bedtime., Disp: 90 tablet, Rfl: 0 .  triamcinolone cream (KENALOG) 0.1 %, Apply 1 application topically 2 (two) times daily., Disp: 45 g, Rfl: 0  No Known Allergies  I personally reviewed active problem list, medication list, allergies, family history, social history with the patient/caregiver today.   ROS  Constitutional: Negative for fever or weight change.  Respiratory: Negative for cough and shortness of breath.   Cardiovascular: Negative for chest pain or palpitations.  Gastrointestinal: Negative for abdominal pain, no bowel changes.  Musculoskeletal: Negative for gait problem or joint swelling.  Skin: Negative for rash.  Neurological: Negative for dizziness or headache.  No other specific complaints in a complete review of systems (except as listed in HPI above).  Objective  Vitals:   08/29/18 0929  BP: 122/90  Pulse: 95  Resp: 16  Temp: 97.6 F (36.4 C)  TempSrc: Oral   SpO2: 99%  Weight: 184 lb 11.2 oz (83.8 kg)  Height: 5\' 9"  (1.753 m)    Body mass index is 27.28 kg/m.  Physical Exam  Constitutional: Patient appears well-developed and well-nourished. Overweight. No distress.  HEENT: head atraumatic, normocephalic, pupils equal and reactive to light, neck supple, throat within normal limits Cardiovascular: Normal rate, regular rhythm and normal heart sounds.  No murmur heard. No BLE edema. Pulmonary/Chest: Effort normal and breath sounds normal. No respiratory distress. Abdominal: Soft.  There is no tenderness. Psychiatric: Patient has a normal mood and affect. behavior is normal. Judgment and thought content  normal.  Recent Results (from the past 2160 hour(s))  PSA     Status: None   Collection Time: 08/24/18  2:10 PM  Result Value Ref Range   Prostate Specific Ag, Serum <0.1 0.0 - 4.0 ng/mL    Comment: Roche ECLIA methodology. According to the American Urological Association, Serum PSA should decrease and remain at undetectable levels after radical prostatectomy. The AUA defines biochemical recurrence as an initial PSA value 0.2 ng/mL or greater followed by a subsequent confirmatory PSA value 0.2 ng/mL or greater. Values obtained with different assay methods or kits cannot be used interchangeably. Results cannot be interpreted as absolute evidence of the presence or absence of malignant disease.     PHQ2/9: Depression screen [facility] 2/9 08/29/2018 07/18/2018 02/11/2018 01/05/2018 02/05/2017  Decreased Interest 2 2 1  0 0  Down, Depressed, Hopeless 1 3 1  - 0  PHQ - 2 Score 3 5 2  0 0  Altered sleeping 0 3 1 - -  Tired, decreased energy 0 3 1 - -  Change in appetite 1 1 1  - -  Feeling bad or failure about yourself  1 1 3  - -  Trouble concentrating 0 0 1 - -  Moving slowly or fidgety/restless 0 0 1 - -  Suicidal thoughts 0 0 - - -  PHQ-9 Score 5 13 10  - -  Difficult doing work/chores Not difficult at all Very difficult Somewhat difficult - -      Fall Risk: Fall Risk  08/29/2018 07/18/2018 02/11/2018 01/05/2018 05/07/2017  Falls in the past year? 0 0 No No No  Number falls in past yr: 0 0 - - -  Injury with Fall? 0 0 - - -  Risk for fall due to : - - Impaired vision - -  Risk for fall due to: Comment - - wears eyeglasses - -      Assessment & Plan  1. Moderate episode of recurrent major depressive disorder (HCC)  - sertraline (ZOLOFT) 50 MG tablet; Take 1 tablet (50 mg total) by mouth daily.  Dispense: 90 tablet; Refill: 1  2. Essential hypertension  Increasing dose of medication today, DBP has been elevated past couple of visits  - lisinopril (PRINIVIL,ZESTRIL) 40 MG tablet; Take 1 tablet (40 mg total) by mouth daily.  Dispense: 90 tablet; Refill: 0   3. Simple chronic bronchitis ([place])  Discussed importance of increase activity to increase lung capacity

## 2018-09-02 ENCOUNTER — Ambulatory Visit
Admission: RE | Admit: 2018-09-02 | Discharge: 2018-09-02 | Disposition: A | Discharge status: Home / Self Care | Payer: Medicare HMO | Source: Ambulatory Visit | Attending: Nurse Practitioner | Admitting: Nurse Practitioner

## 2018-09-02 DIAGNOSIS — Z122 Encounter for screening for malignant neoplasm of respiratory organs: Secondary | ICD-10-CM | POA: Diagnosis not present

## 2018-09-02 DIAGNOSIS — Z87891 Personal history of nicotine dependence: Secondary | ICD-10-CM | POA: Diagnosis not present

## 2018-09-02 DIAGNOSIS — R918 Other nonspecific abnormal finding of lung field: Secondary | ICD-10-CM | POA: Diagnosis not present

## 2018-09-03 ENCOUNTER — Encounter: Payer: Self-pay | Admitting: *Deleted

## 2018-09-04 ENCOUNTER — Encounter: Payer: Self-pay | Admitting: Family Medicine

## 2018-09-04 DIAGNOSIS — R918 Other nonspecific abnormal finding of lung field: Secondary | ICD-10-CM | POA: Insufficient documentation

## 2018-09-06 ENCOUNTER — Encounter: Payer: Self-pay | Admitting: *Deleted

## 2018-09-07 ENCOUNTER — Ambulatory Visit (INDEPENDENT_AMBULATORY_CARE_PROVIDER_SITE_OTHER): Payer: Medicare HMO | Admitting: Urology

## 2018-09-07 ENCOUNTER — Encounter: Payer: Self-pay | Admitting: Urology

## 2018-09-07 VITALS — BP 129/79 | HR 106 | Ht 69.0 in | Wt 184.0 lb

## 2018-09-07 DIAGNOSIS — C61 Malignant neoplasm of prostate: Secondary | ICD-10-CM

## 2018-09-07 NOTE — Progress Notes (Signed)
   09/07/2018 9:01 AM   [name] [birth year] [id number]  Reason for visit: Follow up prostate cancer  HPI: I saw Mr. Staples in urology clinic today for prostate cancer follow-up.  To briefly summarize, he is a healthy 68 year old African-American male that underwent a robotic prostatectomy and bilateral lymphadenectomy in January 2018 with Dr. Pilar Jarvis.  Final pathology showed Gleason score 3+4 = 7 prostate cancer stage pT3aN0 Mx, there was focal extraprostatic extension and perineural invasion, margins were negative.  Pretreatment PSA was 6, and PSA after surgery was undetectable.  He developed biochemical recurrence in August 2019 with a PSA of 0.2 x2, and underwent salvage radiation to the prostatic fossa and 6 months of ADT(03/08/2018).  His most recent PSA 08/24/2018 is undetectable.  Overall, he is doing well.  He has had some decreased energy and sweats from his ADT injection.  He has very mild stress urinary incontinence, and nocturia twice per night.    We had a long conversation again about biochemical recurrence after prostatectomy, and the need for close PSA surveillance after salvage radiation.  We also reviewed the plan of monitoring the PSA, with plan for resuming ADT in the future if the PSA were to increase.  Follow-up 3 months with Dr. Donella Stade for PSA Follow-up 6 months with me with PSA  Nickolas Madrid, MD 09/07/2018  A total of 15 minutes were spent face-to-face with the patient, greater than 50% was spent in patient education, counseling, and coordination of care regarding prostate cancer and treatment strategies.

## 2018-09-09 ENCOUNTER — Encounter: Payer: Self-pay | Admitting: *Deleted

## 2018-09-21 ENCOUNTER — Other Ambulatory Visit: Payer: Self-pay

## 2018-09-21 ENCOUNTER — Inpatient Hospital Stay: Payer: Medicare HMO | Attending: Radiation Oncology

## 2018-09-21 DIAGNOSIS — C61 Malignant neoplasm of prostate: Secondary | ICD-10-CM | POA: Diagnosis not present

## 2018-09-21 LAB — PSA: Prostatic Specific Antigen: <0.01 ng/mL (ref 0.00–4.00)

## 2018-09-27 ENCOUNTER — Other Ambulatory Visit: Payer: Self-pay

## 2018-09-28 ENCOUNTER — Ambulatory Visit
Admission: RE | Admit: 2018-09-28 | Discharge: 2018-09-28 | Disposition: A | Discharge status: Home / Self Care | Payer: Medicare HMO | Source: Ambulatory Visit | Attending: Radiation Oncology | Admitting: Radiation Oncology

## 2018-09-28 ENCOUNTER — Encounter: Payer: Self-pay | Admitting: Radiation Oncology

## 2018-09-28 ENCOUNTER — Other Ambulatory Visit: Payer: Self-pay

## 2018-09-28 VITALS — BP 131/85 | HR 106 | Temp 95.4°F | Resp 18 | Wt 185.1 lb

## 2018-09-28 DIAGNOSIS — Z923 Personal history of irradiation: Secondary | ICD-10-CM | POA: Insufficient documentation

## 2018-09-28 DIAGNOSIS — R197 Diarrhea, unspecified: Secondary | ICD-10-CM | POA: Insufficient documentation

## 2018-09-28 DIAGNOSIS — C61 Malignant neoplasm of prostate: Secondary | ICD-10-CM | POA: Diagnosis not present

## 2018-09-28 NOTE — Progress Notes (Signed)
Radiation Oncology Follow up Note  Name: Ernest Sweeney   Date:   09/28/2018 MRN:  [id number] DOB: 10/29/1950    This 68 y.o. male presents to the clinic today for four-month follow-up status post salvage radiation therapy after robotic prostatectomy for Gleason 7 stage IIIB adenocarcinoma prostate.  REFERRING PROVIDER: [name], MD  HPI: patient is a 68 year old male now out 4 months having completed salvage radiation therapy after robotic prostatectomy for Gleason 7 (3+4) adenocarcinoma stage IIIa (T3 N0 M0). He is seen today in routine follow up is doing well does occasionally have some mild intermittent diarrhea does use occasional Imodium. Does not follow any low residue diet. He is having very little lower urinary tract symptoms. His most recent PSA is less than 0.01.Marland Kitchen  COMPLICATIONS OF TREATMENT: none  FOLLOW UP COMPLIANCE: keeps appointments   PHYSICAL EXAM:  BP 131/85 (BP Location: Left Arm, Patient Position: Sitting)   Pulse (!) 106   Temp (!) 95.4 F (35.2 C) (Tympanic)   Resp 18   Wt 185 lb 1.2 oz (84 kg)   BMI 27.33 kg/m  Well-developed well-nourished patient in NAD. HEENT reveals PERLA, EOMI, discs not visualized.  Oral cavity is clear. No oral mucosal lesions are identified. Neck is clear without evidence of cervical or supraclavicular adenopathy. Lungs are clear to A&P. Cardiac examination is essentially unremarkable with regular rate and rhythm without murmur rub or thrill. Abdomen is benign with no organomegaly or masses noted. Motor sensory and DTR levels are equal and symmetric in the upper and lower extremities. Cranial nerves II through XII are grossly intact. Proprioception is intact. No peripheral adenopathy or edema is identified. No motor or sensory levels are noted. Crude visual fields are within normal range.  RADIOLOGY RESULTS: no current films for review  PLAN: present time patient is under excellent biochemical control of his prostate cancer I'm  please was overall progress. I've asked to see him back in 6 months for follow-up the PSA at that time. I will then go to once your follow-up visits.further Lupron suppression will depend on PSA values. He continues close follow-up care with urology. Patient knows to call with any concerns.  I would like to take this opportunity to thank you for allowing me to participate in the care of your patient.Noreene Filbert, MD

## 2018-10-11 ENCOUNTER — Telehealth: Payer: Self-pay | Admitting: Urology

## 2018-10-11 NOTE — Telephone Encounter (Signed)
Called pt informed him that there is not current need for Lupron unless PSA rises, pt gave verbal understanding.

## 2018-10-11 NOTE — Telephone Encounter (Signed)
Per your last note, it appears this pt is to only resume Lupron if PSA increases. Just want to confirm this as pt is calling requesting injection. Thanks

## 2018-10-11 NOTE — Telephone Encounter (Signed)
Patient does not need more ADT, PSA is undetectable.   We will continue to follow PSA, would only restart ADT if psa rises  Thanks Nickolas Madrid, MD 10/11/2018

## 2018-10-11 NOTE — Telephone Encounter (Signed)
Patient called asking about his next Lupron injection and when is should have this done? He had his last one done in August of 2019 Please advise and I will work on the [employer]

## 2018-10-18 ENCOUNTER — Telehealth: Payer: Self-pay | Admitting: Urology

## 2018-10-18 NOTE — Telephone Encounter (Signed)
NO PA REQUIRED FOR LUPRON  PER SNINSKY PT DOES NOT NEED LUPRON UNLESS HIS PSA STARTS RISING   MICHELLE

## 2018-11-28 ENCOUNTER — Other Ambulatory Visit: Payer: Self-pay

## 2018-11-28 ENCOUNTER — Encounter: Payer: Self-pay | Admitting: Family Medicine

## 2018-11-28 ENCOUNTER — Ambulatory Visit (INDEPENDENT_AMBULATORY_CARE_PROVIDER_SITE_OTHER): Payer: Medicare HMO | Admitting: Family Medicine

## 2018-11-28 VITALS — BP 112/64 | HR 104 | Temp 97.6°F | Resp 18 | Ht 69.0 in | Wt 183.6 lb

## 2018-11-28 DIAGNOSIS — I10 Essential (primary) hypertension: Secondary | ICD-10-CM

## 2018-11-28 DIAGNOSIS — Z131 Encounter for screening for diabetes mellitus: Secondary | ICD-10-CM

## 2018-11-28 DIAGNOSIS — R739 Hyperglycemia, unspecified: Secondary | ICD-10-CM | POA: Diagnosis not present

## 2018-11-28 DIAGNOSIS — I700 Atherosclerosis of aorta: Secondary | ICD-10-CM | POA: Diagnosis not present

## 2018-11-28 DIAGNOSIS — F5102 Adjustment insomnia: Secondary | ICD-10-CM

## 2018-11-28 DIAGNOSIS — F320 Major depressive disorder, single episode, mild: Secondary | ICD-10-CM | POA: Diagnosis not present

## 2018-11-28 DIAGNOSIS — F32A Depression, unspecified: Secondary | ICD-10-CM

## 2018-11-28 DIAGNOSIS — J410 Simple chronic bronchitis: Secondary | ICD-10-CM

## 2018-11-28 DIAGNOSIS — Z9079 Acquired absence of other genital organ(s): Secondary | ICD-10-CM | POA: Diagnosis not present

## 2018-11-28 DIAGNOSIS — Z8546 Personal history of malignant neoplasm of prostate: Secondary | ICD-10-CM

## 2018-11-28 DIAGNOSIS — R69 Illness, unspecified: Secondary | ICD-10-CM | POA: Diagnosis not present

## 2018-11-28 MED ORDER — LISINOPRIL 30 MG PO TABS: 30.0000 mg | ORAL_TABLET | Freq: Every day | ORAL | 0 refills | Status: DC

## 2018-11-28 MED ORDER — TRAZODONE HCL 100 MG PO TABS: 100.0000 mg | ORAL_TABLET | Freq: Every day | ORAL | 0 refills | Status: DC

## 2018-11-28 MED ORDER — ATORVASTATIN CALCIUM 40 MG PO TABS: 40.0000 mg | ORAL_TABLET | Freq: Every day | ORAL | 1 refills | Status: DC

## 2018-11-28 NOTE — Progress Notes (Signed)
Name: Ernest Sweeney   MRN: [id number]    DOB: [birth year]   Date:11/28/2018       Progress Note  Subjective  Chief Complaint  Chief Complaint  Patient presents with  . Medication Refill  . Hypertension    Denies any symptoms  . Depression  . Hyperlipidemia    HPI  Depression: seen January 2020 and phq 9 was very high at 34, he has a long history of depression but was feeling more down and hopeless, we started him on Zofot 50 mg he has not been taking it consistently, he states forgets to take it at night, reminded him again to take with other medications in am  to increase compliance. Phq 9 has improved, he states medication seems to help and he will try taking it daily. He is back at work - part time and it seems to help his mood also   HTN: DBP was elevated two visits in a row so we adjusted dose of lisinopril from 20 to 40 mg but now bp is low, he states gets a little dizzy when working in his yard, we will decrease dose to 30 mg today and monitor.  He denies  chest pain or palpitation.   Chronic bronchitis: he is still coughing but not daily, cannot afford Breo, he stopped medication on his own, quit smoking in 2016. No sob or wheezing. Working part time and has been more active, states doing well now.   Hyperlipidemia/Atherosclerosis of Aorta: taking aspirin, atorvastatin and denies myalgia. We will recheck labs today  Insomnia: taking trazodone prn and doing well   History of prostate cancer: last PSA was normal seeing Dr. Baruch Gouty every 6 months now.   Patient Active Problem List   Diagnosis Date Noted  . Lung nodules 09/04/2018  . Chronic bronchitis ([place]) 04/23/2017  . Atherosclerosis of aorta ([place]) 04/23/2017  . Coronary artery disease due to calcified coronary lesion 04/23/2017  . Glaucoma, left eye 08/04/2016  . Prostate cancer ([place]) 07/15/2016  . History of prostatectomy 07/15/2016  . Vitamin D deficiency 05/31/2015  . Hyperlipidemia LDL goal <100 05/31/2015  .  Seasonal allergic rhinitis 05/31/2015  . Chronic pain of both shoulders 05/31/2015  . Family history of malignant neoplasm of prostate 07/26/2012  . Hematuria, microscopic 07/26/2012    Past Surgical History:  Procedure Laterality Date  . COLONOSCOPY  2010   Dr Sula Rumple  . excision skin cyst  06-19-14   Dr. Festus Aloe CYST  . PELVIC LYMPH NODE DISSECTION N/A 07/15/2016   Procedure: PELVIC LYMPH NODE DISSECTION;  Surgeon: Nickie Retort, MD;  Location: [facility] ORS;  Service: Urology;  Laterality: N/A;  . ROBOT ASSISTED LAPAROSCOPIC RADICAL PROSTATECTOMY N/A 07/15/2016   Procedure: ROBOTIC ASSISTED LAPAROSCOPIC RADICAL PROSTATECTOMY;  Surgeon: Nickie Retort, MD;  Location: [facility] ORS;  Service: Urology;  Laterality: N/A;    Family History  Problem Relation Age of Onset  . Heart disease Mother   . Heart attack Mother   . Diabetes Father   . Heart attack Father   . Dementia Father   . Cancer Sister        Breast  . Cancer Brother        Prostate  . Healthy Sister   . Stroke Brother     Social History   Socioeconomic History  . Marital status: Widowed    Spouse name: Not on file  . Number of children: 0  . Years of education: Not on file  . Highest  education level: 12th grade  Occupational History  . Occupation: Retired  Scientific laboratory technician  . Financial resource strain: Somewhat hard  . Food insecurity:    Worry: Sometimes true    Inability: Sometimes true  . Transportation needs:    Medical: No    Non-medical: No  Tobacco Use  . Smoking status: Former Smoker    Packs/day: 0.50    Years: 40.00    Pack years: 20.00    Types: Cigarettes    Last attempt to quit: 03/23/2016    Years since quitting: 2.6  . Smokeless tobacco: Never Used  . Tobacco comment: smoking cessation materials not required  Substance and Sexual Activity  . Alcohol use: Yes    Alcohol/week: 0.0 standard drinks    Comment: social  . Drug use: No  . Sexual activity: Not Currently    Birth  control/protection: Condom  Lifestyle  . Physical activity:    Days per week: 0 days    Minutes per session: 0 min  . Stress: Not at all  Relationships  . Social connections:    Talks on phone: Patient refused    Gets together: Patient refused    Attends religious service: Patient refused    Active member of club or organization: Patient refused    Attends meetings of clubs or organizations: Patient refused    Relationship status: Widowed  . Intimate partner violence:    Fear of current or ex partner: No    Emotionally abused: No    Physically abused: No    Forced sexual activity: No  Other Topics Concern  . Not on file  Social History Narrative  . Not on file     Current Outpatient Medications:  .  aspirin 81 MG tablet, Take 81 mg by mouth daily., Disp: , Rfl:  .  atorvastatin (LIPITOR) 40 MG tablet, Take 1 tablet (40 mg total) by mouth daily. New cholesterol medication, Disp: 90 tablet, Rfl: 1 .  Cholecalciferol (VITAMIN D) 2000 units CAPS, Take 1 capsule (2,000 Units total) by mouth daily., Disp: 30 capsule, Rfl: 0 .  latanoprost (XALATAN) 0.005 % ophthalmic solution, Place 1 drop into both eyes at bedtime. , Disp: , Rfl:  .  lisinopril (PRINIVIL,ZESTRIL) 40 MG tablet, Take 1 tablet (40 mg total) by mouth daily., Disp: 90 tablet, Rfl: 0 .  sertraline (ZOLOFT) 50 MG tablet, Take 1 tablet (50 mg total) by mouth daily., Disp: 90 tablet, Rfl: 1 .  sildenafil (REVATIO) 20 MG tablet, Take 1 to 5 tablets PO daily prn, Disp: 30 tablet, Rfl: 3 .  traZODone (DESYREL) 100 MG tablet, Take 1 tablet (100 mg total) by mouth at bedtime., Disp: 90 tablet, Rfl: 0 .  triamcinolone cream (KENALOG) 0.1 %, Apply 1 application topically 2 (two) times daily., Disp: 45 g, Rfl: 0  No Known Allergies  I personally reviewed active problem list, medication list, allergies, family history, social history with the patient/caregiver today.   ROS  Ten systems reviewed and is negative except as  mentioned in HPI   Objective  Vitals:   11/28/18 0848  BP: 112/64  Pulse: (!) 104  Resp: 18  Temp: 97.6 F (36.4 C)  TempSrc: Oral  SpO2: 98%  Weight: 183 lb 9.6 oz (83.3 kg)  Height: 5\' 9"  (1.753 m)    Body mass index is 27.11 kg/m.  Physical Exam   Constitutional: Patient appears well-developed and well-nourished. Overweight.  No distress.  HEENT: head atraumatic, normocephalic, pupils equal and reactive to  light,neck supple, wearing a surgical mask  Cardiovascular: Normal rate, regular rhythm and normal heart sounds.  No murmur heard. No BLE edema. Pulmonary/Chest: Effort normal and breath sounds normal. No respiratory distress. Abdominal: Soft.  There is no tenderness. Psychiatric: Patient has a normal mood and affect. behavior is normal. Judgment and thought content normal.  Recent Results (from the past 2160 hour(s))  PSA     Status: None   Collection Time: 09/21/18 10:36 AM  Result Value Ref Range   Prostatic Specific Antigen <0.01 0.00 - 4.00 ng/mL    Comment: (NOTE) While PSA levels of <=4.0 ng/ml are reported as reference range, some men with levels below 4.0 ng/ml can have prostate cancer and many men with PSA above 4.0 ng/ml do not have prostate cancer.  Other tests such as free PSA, age specific reference ranges, PSA velocity and PSA doubling time may be helpful especially in men less than 68 years old. Performed at [facility], [place] [address], [place], [place] [address]      PHQ2/9: Depression screen [facility] 2/9 11/28/2018 08/29/2018 07/18/2018 02/11/2018 01/05/2018  Decreased Interest 1 2 2 1  0  Down, Depressed, Hopeless 1 1 3 1  -  PHQ - 2 Score 2 3 5 2  0  Altered sleeping 1 0 3 1 -  Tired, decreased energy 0 0 3 1 -  Change in appetite 0 1 1 1  -  Feeling bad or failure about yourself  1 1 1 3  -  Trouble concentrating 0 0 0 1 -  Moving slowly or fidgety/restless 0 0 0 1 -  Suicidal thoughts 0 0 0 - -  PHQ-9 Score 4 5 13 10  -  Difficult doing  work/chores Somewhat difficult Not difficult at all Very difficult Somewhat difficult -    phq 9 is positive   Fall Risk: Fall Risk  11/28/2018 08/29/2018 07/18/2018 02/11/2018 01/05/2018  Falls in the past year? 0 0 0 No No  Number falls in past yr: 0 0 0 - -  Injury with Fall? 0 0 0 - -  Risk for fall due to : - - - Impaired vision -  Risk for fall due to: Comment - - - wears eyeglasses -     Functional Status Survey: Is the patient deaf or have difficulty hearing?: No Does the patient have difficulty seeing, even when wearing glasses/contacts?: No Does the patient have difficulty concentrating, remembering, or making decisions?: No Does the patient have difficulty walking or climbing stairs?: No Does the patient have difficulty dressing or bathing?: No Does the patient have difficulty doing errands alone such as visiting a doctor's office or shopping?: No    Assessment & Plan  1. Essential hypertension  - lisinopril (ZESTRIL) 30 MG tablet; Take 1 tablet (30 mg total) by mouth daily.  Dispense: 90 tablet; Refill: 0 - COMPLETE METABOLIC PANEL WITH GFR - CBC with Differential/Platelet  2. Mild episode of  major depressive disorder ([place])   3. Simple chronic bronchitis ([facility])  Not on medication, stable   4. History of prostate cancer   5. Atherosclerosis of aorta ([facility])  - Lipid panel - atorvastatin (LIPITOR) 40 MG tablet; Take 1 tablet (40 mg total) by mouth daily. New cholesterol medication  Dispense: 90 tablet; Refill: 1  6. Diabetes mellitus screening  - Hemoglobin A1c  7. Hyperglycemia  - Hemoglobin A1c  8. History of prostatectomy   9. Adjustment insomnia  - traZODone (DESYREL) 100 MG tablet; Take 1 tablet (100 mg  total) by mouth at bedtime.  Dispense: 90 tablet; Refill: 0

## 2018-11-29 LAB — LIPID PANEL
Cholesterol: 185 mg/dL (ref <200)
HDL: 76 mg/dL (ref >=40)
LDL Cholesterol (Calc): 92 mg/dL (calc)
Non-HDL Cholesterol (Calc): 109 mg/dL (calc) (ref <130)
Total CHOL/HDL Ratio: 2.4 (calc) (ref <5.0)
Triglycerides: 80 mg/dL (ref <150)

## 2018-11-29 LAB — CBC WITH DIFFERENTIAL/PLATELET
Absolute Monocytes: 369 cells/uL (ref 200–950)
Basophils Absolute: 21 cells/uL (ref 0–200)
Basophils Relative: 0.7 %
Eosinophils Absolute: 60 cells/uL (ref 15–500)
Eosinophils Relative: 2 %
HCT: 38.6 % (ref 38.5–50.0)
Hemoglobin: 13.2 g/dL (ref 13.2–17.1)
Lymphs Abs: 915 cells/uL (ref 850–3900)
MCH: 32.7 pg (ref 27.0–33.0)
MCHC: 34.2 g/dL (ref 32.0–36.0)
MCV: 95.5 fL (ref 80.0–100.0)
MPV: 9.3 fL (ref 7.5–12.5)
Monocytes Relative: 12.3 %
Neutro Abs: 1635 cells/uL (ref 1500–7800)
Neutrophils Relative %: 54.5 %
Platelets: 338 10*3/uL (ref 140–400)
RBC: 4.04 10*6/uL — ABNORMAL LOW (ref 4.20–5.80)
RDW: 12.9 % (ref 11.0–15.0)
Total Lymphocyte: 30.5 %
WBC: 3 10*3/uL — ABNORMAL LOW (ref 3.8–10.8)

## 2018-11-29 LAB — COMPLETE METABOLIC PANEL WITH GFR
AG Ratio: 1.8 (calc) (ref 1.0–2.5)
ALT: 22 U/L (ref 9–46)
AST: 22 U/L (ref 10–35)
Albumin: 4.6 g/dL (ref 3.6–5.1)
Alkaline phosphatase (APISO): 70 U/L (ref 35–144)
BUN: 24 mg/dL (ref 7–25)
CO2: 30 mmol/L (ref 20–32)
Calcium: 10.6 mg/dL — ABNORMAL HIGH (ref 8.6–10.3)
Chloride: 104 mmol/L (ref 98–110)
Creat: 1.16 mg/dL (ref 0.70–1.25)
GFR, Est African American: 75 mL/min/{1.73_m2} (ref >=60)
GFR, Est Non African American: 65 mL/min/{1.73_m2} (ref >=60)
Globulin: 2.6 g/dL (calc) (ref 1.9–3.7)
Glucose, Bld: 93 mg/dL (ref 65–99)
Potassium: 4.6 mmol/L (ref 3.5–5.3)
Sodium: 140 mmol/L (ref 135–146)
Total Bilirubin: 0.7 mg/dL (ref 0.2–1.2)
Total Protein: 7.2 g/dL (ref 6.1–8.1)

## 2018-11-29 LAB — HEMOGLOBIN A1C
Hgb A1c MFr Bld: 5.7 % of total Hgb — ABNORMAL HIGH (ref <5.7)
Mean Plasma Glucose: 117 (calc)
eAG (mmol/L): 6.5 (calc)

## 2018-12-12 DIAGNOSIS — H401132 Primary open-angle glaucoma, bilateral, moderate stage: Secondary | ICD-10-CM | POA: Diagnosis not present

## 2018-12-19 DIAGNOSIS — H401132 Primary open-angle glaucoma, bilateral, moderate stage: Secondary | ICD-10-CM | POA: Diagnosis not present

## 2019-01-02 DIAGNOSIS — H401132 Primary open-angle glaucoma, bilateral, moderate stage: Secondary | ICD-10-CM | POA: Diagnosis not present

## 2019-02-10 ENCOUNTER — Other Ambulatory Visit: Payer: Self-pay | Admitting: Family Medicine

## 2019-02-10 DIAGNOSIS — I10 Essential (primary) hypertension: Secondary | ICD-10-CM

## 2019-02-10 NOTE — Telephone Encounter (Signed)
Hypertension medication request: Lisinopril to [facility]  Last office visit pertaining to hypertension: 11/28/2018   BP Readings from Last 3 Encounters:  11/28/18 112/64  09/28/18 131/85  09/07/18 129/79    Lab Results  Component Value Date   CREATININE 1.16 11/28/2018   BUN 24 11/28/2018   NA 140 11/28/2018   K 4.6 11/28/2018   CL 104 11/28/2018   CO2 30 11/28/2018     Follow up on 03/01/2019

## 2019-02-21 ENCOUNTER — Ambulatory Visit (INDEPENDENT_AMBULATORY_CARE_PROVIDER_SITE_OTHER): Payer: Medicare HMO

## 2019-02-21 VITALS — Ht 69.0 in | Wt 188.0 lb

## 2019-02-21 DIAGNOSIS — Z0000 Encounter for general adult medical examination without abnormal findings: Secondary | ICD-10-CM

## 2019-02-21 NOTE — Patient Instructions (Signed)
Ernest Sweeney , Thank you for taking time to come for your Medicare Wellness Visit. I appreciate your ongoing commitment to your health goals. Please review the following plan we discussed and let me know if I can assist you in the future.   Screening recommendations/referrals: Colonoscopy: done 10/10/14. Repeat in 2021. Recommended yearly ophthalmology/optometry visit for glaucoma screening and checkup Recommended yearly dental visit for hygiene and checkup  Vaccinations: Influenza vaccine: done 04/27/18 Pneumococcal vaccine: done 02/11/18 Tdap vaccine: done 04/03/10 Shingles vaccine: Shingrix discussed. Please contact your pharmacy for coverage information.   Advanced directives: Advance directive discussed with you today. I have provided a copy for you to complete at home and have notarized. Once this is complete please bring a copy in to our office so we can scan it into your chart.  Conditions/risks identified: Keep up the great work!  Next appointment: Please follow up in one year for your Medicare Annual Wellness visit.    Preventive Care 70 Years and Older, Male Preventive care refers to lifestyle choices and visits with your health care provider that can promote health and wellness. What does preventive care include?  A yearly physical exam. This is also called an annual well check.  Dental exams once or twice a year.  Routine eye exams. Ask your health care provider how often you should have your eyes checked.  Personal lifestyle choices, including:  Daily care of your teeth and gums.  Regular physical activity.  Eating a healthy diet.  Avoiding tobacco and drug use.  Limiting alcohol use.  Practicing safe sex.  Taking low doses of aspirin every day.  Taking vitamin and mineral supplements as recommended by your health care provider. What happens during an annual well check? The services and screenings done by your health care provider during your annual well check  will depend on your age, overall health, lifestyle risk factors, and family history of disease. Counseling  Your health care provider may ask you questions about your:  Alcohol use.  Tobacco use.  Drug use.  Emotional well-being.  Home and relationship well-being.  Sexual activity.  Eating habits.  History of falls.  Memory and ability to understand (cognition).  Work and work Statistician. Screening  You may have the following tests or measurements:  Height, weight, and BMI.  Blood pressure.  Lipid and cholesterol levels. These may be checked every 5 years, or more frequently if you are over 23 years old.  Skin check.  Lung cancer screening. You may have this screening every year starting at age 102 if you have a 30-pack-year history of smoking and currently smoke or have quit within the past 15 years.  Fecal occult blood test (FOBT) of the stool. You may have this test every year starting at age 56.  Flexible sigmoidoscopy or colonoscopy. You may have a sigmoidoscopy every 5 years or a colonoscopy every 10 years starting at age 39.  Prostate cancer screening. Recommendations will vary depending on your family history and other risks.  Hepatitis C blood test.  Hepatitis B blood test.  Sexually transmitted disease (STD) testing.  Diabetes screening. This is done by checking your blood sugar (glucose) after you have not eaten for a while (fasting). You may have this done every 1-3 years.  Abdominal aortic aneurysm (AAA) screening. You may need this if you are a current or former smoker.  Osteoporosis. You may be screened starting at age 75 if you are at high risk. Talk with your health care provider  about your test results, treatment options, and if necessary, the need for more tests. Vaccines  Your health care provider may recommend certain vaccines, such as:  Influenza vaccine. This is recommended every year.  Tetanus, diphtheria, and acellular pertussis  (Tdap, Td) vaccine. You may need a Td booster every 10 years.  Zoster vaccine. You may need this after age 56.  Pneumococcal 13-valent conjugate (PCV13) vaccine. One dose is recommended after age 40.  Pneumococcal polysaccharide (PPSV23) vaccine. One dose is recommended after age 103. Talk to your health care provider about which screenings and vaccines you need and how often you need them. This information is not intended to replace advice given to you by your health care provider. Make sure you discuss any questions you have with your health care provider. Document Released: 07/26/2015 Document Revised: 03/18/2016 Document Reviewed: 04/30/2015 Elsevier Interactive Patient Education  2017 [place] Prevention in the Home Falls can cause injuries. They can happen to people of all ages. There are many things you can do to make your home safe and to help prevent falls. What can I do on the outside of my home?  Regularly fix the edges of walkways and driveways and fix any cracks.  Remove anything that might make you trip as you walk through a door, such as a raised step or threshold.  Trim any bushes or trees on the path to your home.  Use bright outdoor lighting.  Clear any walking paths of anything that might make someone trip, such as rocks or tools.  Regularly check to see if handrails are loose or broken. Make sure that both sides of any steps have handrails.  Any raised decks and porches should have guardrails on the edges.  Have any leaves, snow, or ice cleared regularly.  Use sand or salt on walking paths during winter.  Clean up any spills in your garage right away. This includes oil or grease spills. What can I do in the bathroom?  Use night lights.  Install grab bars by the toilet and in the tub and shower. Do not use towel bars as grab bars.  Use non-skid mats or decals in the tub or shower.  If you need to sit down in the shower, use a plastic, non-slip  stool.  Keep the floor dry. Clean up any water that spills on the floor as soon as it happens.  Remove soap buildup in the tub or shower regularly.  Attach bath mats securely with double-sided non-slip rug tape.  Do not have throw rugs and other things on the floor that can make you trip. What can I do in the bedroom?  Use night lights.  Make sure that you have a light by your bed that is easy to reach.  Do not use any sheets or blankets that are too big for your bed. They should not hang down onto the floor.  Have a firm chair that has side arms. You can use this for support while you get dressed.  Do not have throw rugs and other things on the floor that can make you trip. What can I do in the kitchen?  Clean up any spills right away.  Avoid walking on wet floors.  Keep items that you use a lot in easy-to-reach places.  If you need to reach something above you, use a strong step stool that has a grab bar.  Keep electrical cords out of the way.  Do not use floor polish or  wax that makes floors slippery. If you must use wax, use non-skid floor wax.  Do not have throw rugs and other things on the floor that can make you trip. What can I do with my stairs?  Do not leave any items on the stairs.  Make sure that there are handrails on both sides of the stairs and use them. Fix handrails that are broken or loose. Make sure that handrails are as long as the stairways.  Check any carpeting to make sure that it is firmly attached to the stairs. Fix any carpet that is loose or worn.  Avoid having throw rugs at the top or bottom of the stairs. If you do have throw rugs, attach them to the floor with carpet tape.  Make sure that you have a light switch at the top of the stairs and the bottom of the stairs. If you do not have them, ask someone to add them for you. What else can I do to help prevent falls?  Wear shoes that:  Do not have high heels.  Have rubber bottoms.  Are  comfortable and fit you well.  Are closed at the toe. Do not wear sandals.  If you use a stepladder:  Make sure that it is fully opened. Do not climb a closed stepladder.  Make sure that both sides of the stepladder are locked into place.  Ask someone to hold it for you, if possible.  Clearly mark and make sure that you can see:  Any grab bars or handrails.  First and last steps.  Where the edge of each step is.  Use tools that help you move around (mobility aids) if they are needed. These include:  Canes.  Walkers.  Scooters.  Crutches.  Turn on the lights when you go into a dark area. Replace any light bulbs as soon as they burn out.  Set up your furniture so you have a clear path. Avoid moving your furniture around.  If any of your floors are uneven, fix them.  If there are any pets around you, be aware of where they are.  Review your medicines with your doctor. Some medicines can make you feel dizzy. This can increase your chance of falling. Ask your doctor what other things that you can do to help prevent falls. This information is not intended to replace advice given to you by your health care provider. Make sure you discuss any questions you have with your health care provider. Document Released: 04/25/2009 Document Revised: 12/05/2015 Document Reviewed: 08/03/2014 Elsevier Interactive Patient Education  2017 [employer].

## 2019-02-21 NOTE — Progress Notes (Signed)
Subjective:   Ernest Sweeney is a 68 y.o. male who presents for Medicare Annual/Subsequent preventive examination.  Virtual Visit via Telephone Note  I connected with Ernest Sweeney on 02/21/19 at 10:40 AM EDT by telephone and verified that I am speaking with the correct person using two identifiers.  Medicare Annual Wellness visit completed telephonically due to Covid-19 pandemic.  Location: Patient: home Provider: office   I discussed the limitations, risks, security and privacy concerns of performing an evaluation and management service by telephone and the availability of in person appointments. The patient expressed understanding and agreed to proceed.  Some vital signs may be absent or patient reported.   Ernest Marker, LPN    Review of Systems:   Cardiac Risk Factors include: advanced age (>10men, >43 women);hypertension;dyslipidemia;male gender     Objective:    Vitals: Ht 5\' 9"  (1.753 m)   Wt 188 lb (85.3 kg)   BMI 27.76 kg/m   Body mass index is 27.76 kg/m.  Advanced Directives 02/21/2019 09/28/2018 06/22/2018 03/02/2018 02/11/2018 02/24/2017 01/28/2017  Does Patient Have a Medical Advance Directive? No No No No No No No  Would patient like information on creating a medical advance directive? Yes (MAU/Ambulatory/Procedural Areas - Information given) No - Patient declined No - Patient declined No - Patient declined Yes (MAU/Ambulatory/Procedural Areas - Information given) - -    Tobacco Social History   Tobacco Use  Smoking Status Former Smoker  . Packs/day: 0.50  . Years: 40.00  . Pack years: 20.00  . Types: Cigarettes  . Quit date: 03/23/2016  . Years since quitting: 2.9  Smokeless Tobacco Never Used  Tobacco Comment   smoking cessation materials not required     Counseling given: Not Answered Comment: smoking cessation materials not required   Clinical Intake:  Pre-visit preparation completed: Yes  Pain : No/denies pain     BMI - recorded: 27.75  Nutritional Status: BMI 25 -29 Overweight Nutritional Risks: None Diabetes: No  How often do you need to have someone help you when you read instructions, pamphlets, or other written materials from your doctor or pharmacy?: 1 - Never  Interpreter Needed?: No  Information entered by :: Ernest Marker LPN  Past Medical History:  Diagnosis Date  . Anxiety    Panic attacks in the past  . Arthritis   . Benign prostatic hypertrophy without lower urinary tract symptoms   . Cancer [facility])    Prostate  . Colon polyp   . GERD (gastroesophageal reflux disease)   . Glaucoma (increased eye pressure)    Left eye only  . Hyperlipidemia   . Hypertension    Past Surgical History:  Procedure Laterality Date  . COLONOSCOPY  2010   Dr Sula Rumple  . excision skin cyst  06-19-14   Dr. Festus Aloe CYST  . PELVIC LYMPH NODE DISSECTION N/A 07/15/2016   Procedure: PELVIC LYMPH NODE DISSECTION;  Surgeon: Nickie Retort, MD;  Location: [facility] ORS;  Service: Urology;  Laterality: N/A;  . ROBOT ASSISTED LAPAROSCOPIC RADICAL PROSTATECTOMY N/A 07/15/2016   Procedure: ROBOTIC ASSISTED LAPAROSCOPIC RADICAL PROSTATECTOMY;  Surgeon: Nickie Retort, MD;  Location: [facility] ORS;  Service: Urology;  Laterality: N/A;   Family History  Problem Relation Age of Onset  . Heart disease Mother   . Heart attack Mother   . Diabetes Father   . Heart attack Father   . Dementia Father   . Cancer Sister        Breast  . Cancer  Brother        Prostate  . Healthy Sister   . Stroke Brother    Social History   Socioeconomic History  . Marital status: Widowed    Spouse name: Not on file  . Number of children: 0  . Years of education: Not on file  . Highest education level: 12th grade  Occupational History  . Occupation: custodian     Comment: part time   Social Needs  . Financial resource strain: Somewhat hard  . Food insecurity    Worry: Never true    Inability: Never true  . Transportation needs    Medical:  No    Non-medical: No  Tobacco Use  . Smoking status: Former Smoker    Packs/day: 0.50    Years: 40.00    Pack years: 20.00    Types: Cigarettes    Quit date: 03/23/2016    Years since quitting: 2.9  . Smokeless tobacco: Never Used  . Tobacco comment: smoking cessation materials not required  Substance and Sexual Activity  . Alcohol use: Yes    Alcohol/week: 0.0 standard drinks    Comment: social  . Drug use: No  . Sexual activity: Not Currently    Birth control/protection: Condom  Lifestyle  . Physical activity    Days per week: 0 days    Minutes per session: 0 min  . Stress: Only a little  Relationships  . Social connections    Talks on phone: More than three times a week    Gets together: Once a week    Attends religious service: Never    Active member of club or organization: No    Attends meetings of clubs or organizations: Never    Relationship status: Widowed  Other Topics Concern  . Not on file  Social History Narrative  . Not on file    Outpatient Encounter Medications as of 02/21/2019  Medication Sig  . aspirin 81 MG tablet Take 81 mg by mouth daily.  Marland Kitchen atorvastatin (LIPITOR) 40 MG tablet Take 1 tablet (40 mg total) by mouth daily. New cholesterol medication  . brimonidine-timolol (COMBIGAN) 0.2-0.5 % ophthalmic solution Place 1 drop into both eyes every 12 (twelve) hours.  . Cholecalciferol (VITAMIN D) 2000 units CAPS Take 1 capsule (2,000 Units total) by mouth daily.  Marland Kitchen latanoprost (XALATAN) 0.005 % ophthalmic solution Place 1 drop into both eyes at bedtime.   Marland Kitchen lisinopril (ZESTRIL) 30 MG tablet Take 1 tablet by mouth once daily  . sildenafil (REVATIO) 20 MG tablet Take 1 to 5 tablets PO daily prn  . traZODone (DESYREL) 100 MG tablet Take 1 tablet (100 mg total) by mouth at bedtime.  . triamcinolone cream (KENALOG) 0.1 % Apply 1 application topically 2 (two) times daily.  . sertraline (ZOLOFT) 50 MG tablet Take 1 tablet (50 mg total) by mouth daily. (Patient  not taking: Reported on 02/21/2019)   No facility-administered encounter medications on file as of 02/21/2019.     Activities of Daily Living In your present state of health, do you have any difficulty performing the following activities: 02/21/2019 11/28/2018  Hearing? N N  Comment declines hearing aids -  Vision? N N  Difficulty concentrating or making decisions? N N  Walking or climbing stairs? N N  Dressing or bathing? N N  Doing errands, shopping? N N  Preparing Food and eating ? N -  Using the Toilet? N -  In the past six months, have you accidently leaked urine?  Y -  Do you have problems with loss of bowel control? N -  Managing your Medications? N -  Managing your Finances? N -  Housekeeping or managing your Housekeeping? N -  Some recent data might be hidden    Patient Care Team: [name], MD as PCP - General (Family Medicine) Abbie Sons, MD as Consulting Physician (Urology)   Assessment:   This is a routine wellness examination for [place].  Exercise Activities and Dietary recommendations Current Exercise Habits: The patient does not participate in regular exercise at present, Exercise limited by: None identified  Goals    . DIET - INCREASE WATER INTAKE     Recommend to drink at least 6-8 8oz glasses of water per day.       Fall Risk Fall Risk  02/21/2019 11/28/2018 08/29/2018 07/18/2018 02/11/2018  Falls in the past year? 0 0 0 0 No  Number falls in past yr: 0 0 0 0 -  Injury with Fall? 0 0 0 0 -  Risk for fall due to : - - - - Impaired vision  Risk for fall due to: Comment - - - - wears eyeglasses  Follow up Falls prevention discussed - - - -   FALL RISK PREVENTION PERTAINING TO THE HOME:  Any stairs in or around the home? Yes  If so, do they handrails? Yes   Home free of loose throw rugs in walkways, pet beds, electrical cords, etc? Yes  Adequate lighting in your home to reduce risk of falls? Yes   ASSISTIVE DEVICES UTILIZED TO PREVENT FALLS:   Life alert? No  Use of a cane, walker or w/c? No  Grab bars in the bathroom? No  Shower chair or bench in shower? No  Elevated toilet seat or a handicapped toilet? No   DME ORDERS:  DME order needed?  No   TIMED UP AND GO:  Was the test performed? No . Telephonic visit.   Education: Fall risk prevention has been discussed.  Intervention(s) required? No    Depression Screen PHQ 2/9 Scores 02/21/2019 11/28/2018 08/29/2018 07/18/2018  PHQ - 2 Score 2 2 3 5   PHQ- 9 Score 3 4 5 13     Cognitive Function     6CIT Screen 02/21/2019 02/11/2018 02/05/2017  What Year? 0 points 0 points 0 points  What month? 0 points 0 points 0 points  What time? 0 points 0 points 0 points  Count back from 20 0 points 0 points 0 points  Months in reverse 0 points 0 points 0 points  Repeat phrase 2 points 0 points 8 points  Total Score 2 0 8    Immunization History  Administered Date(s) Administered  . Influenza, High Dose Seasonal PF 05/21/2016, 05/07/2017, 04/27/2018  . Influenza-Unspecified 04/12/2014, 04/27/2015  . Pneumococcal Conjugate-13 02/05/2017  . Pneumococcal Polysaccharide-23 03/08/2012, 02/11/2018  . Tdap 04/03/2010  . Zoster 02/05/2016    Qualifies for Shingles Vaccine? Yes  Zostavax completed 2017. Due for Shingrix. Education has been provided regarding the importance of this vaccine. Pt has been advised to call insurance company to determine out of pocket expense. Advised may also receive vaccine at local pharmacy or Health Dept. Verbalized acceptance and understanding.  Tdap: Up to date  Flu Vaccine: Up to date  Pneumococcal Vaccine: Up to date   Screening Tests Health Maintenance  Topic Date Due  . INFLUENZA VACCINE  02/11/2019  . COLONOSCOPY  10/10/2019  . TETANUS/TDAP  04/03/2020  . Hepatitis C Screening  Completed  . PNA vac Low Risk Adult  Completed   Cancer Screenings:  Colorectal Screening: Completed 10/10/14. Repeat every 5 years;   Lung Cancer Screening: (Low  Dose CT Chest recommended if Age 21-80 years, 30 pack-year currently smoking OR have quit w/in 15years.) does qualify. Lung cancer screening completed 07/20/18.   Additional Screening:  Hepatitis C Screening: does qualify; Completed 03/15/12  Vision Screening: Recommended annual ophthalmology exams for early detection of glaucoma and other disorders of the eye. Is the patient up to date with their annual eye exam?  Yes  Who is the provider or what is the name of the office in which the pt attends annual eye exams? [facility] Dr. Michelene Heady  Dental Screening: Recommended annual dental exams for proper oral hygiene  Community Resource Referral:  CRR required this visit?  No       Plan:    I have personally reviewed and addressed the Medicare Annual Wellness questionnaire and have noted the following in the patient's chart:  A. Medical and social history B. Use of alcohol, tobacco or illicit drugs  C. Current medications and supplements D. Functional ability and status E.  Nutritional status F.  Physical activity G. Advance directives H. List of other physicians I.  Hospitalizations, surgeries, and ER visits in previous 12 months J.  [place] such as hearing and vision if needed, cognitive and depression L. Referrals and appointments   In addition, I have reviewed and discussed with patient certain preventive protocols, quality metrics, and best practice recommendations. A written personalized care plan for preventive services as well as general preventive health recommendations were provided to patient.   Signed,  Ernest Marker, LPN Nurse Health Advisor   Nurse Notes: none

## 2019-03-01 ENCOUNTER — Other Ambulatory Visit: Payer: Self-pay

## 2019-03-01 ENCOUNTER — Other Ambulatory Visit: Payer: Medicare HMO

## 2019-03-01 ENCOUNTER — Ambulatory Visit (INDEPENDENT_AMBULATORY_CARE_PROVIDER_SITE_OTHER): Payer: Medicare HMO | Admitting: Family Medicine

## 2019-03-01 ENCOUNTER — Encounter: Payer: Self-pay | Admitting: Family Medicine

## 2019-03-01 VITALS — BP 100/70 | HR 80 | Temp 97.1°F | Resp 16 | Ht 69.0 in | Wt 186.9 lb

## 2019-03-01 DIAGNOSIS — Z9079 Acquired absence of other genital organ(s): Secondary | ICD-10-CM | POA: Diagnosis not present

## 2019-03-01 DIAGNOSIS — J410 Simple chronic bronchitis: Secondary | ICD-10-CM | POA: Diagnosis not present

## 2019-03-01 DIAGNOSIS — F5102 Adjustment insomnia: Secondary | ICD-10-CM | POA: Diagnosis not present

## 2019-03-01 DIAGNOSIS — F320 Major depressive disorder, single episode, mild: Secondary | ICD-10-CM

## 2019-03-01 DIAGNOSIS — I10 Essential (primary) hypertension: Secondary | ICD-10-CM

## 2019-03-01 DIAGNOSIS — I700 Atherosclerosis of aorta: Secondary | ICD-10-CM | POA: Diagnosis not present

## 2019-03-01 DIAGNOSIS — J432 Centrilobular emphysema: Secondary | ICD-10-CM

## 2019-03-01 DIAGNOSIS — R69 Illness, unspecified: Secondary | ICD-10-CM | POA: Diagnosis not present

## 2019-03-01 DIAGNOSIS — C61 Malignant neoplasm of prostate: Secondary | ICD-10-CM

## 2019-03-01 MED ORDER — SILDENAFIL CITRATE 100 MG PO TABS: 100.0000 mg | ORAL_TABLET | Freq: Every day | ORAL | 0 refills | Status: DC | PRN

## 2019-03-01 MED ORDER — SERTRALINE HCL 50 MG PO TABS: 50.0000 mg | ORAL_TABLET | Freq: Every day | ORAL | 1 refills | Status: DC

## 2019-03-01 MED ORDER — LISINOPRIL 20 MG PO TABS: 20.0000 mg | ORAL_TABLET | Freq: Every day | ORAL | 0 refills | Status: DC

## 2019-03-01 MED ORDER — TRAZODONE HCL 100 MG PO TABS: 100.0000 mg | ORAL_TABLET | Freq: Every day | ORAL | 0 refills | Status: DC

## 2019-03-01 NOTE — Progress Notes (Signed)
Name: Ernest Sweeney   MRN: [id number]    DOB: 12/04/1950   Date:03/01/2019       Progress Note  Subjective  Chief Complaint  Chief Complaint  Patient presents with  . Hyperlipidemia  . Hypertension  . Depression    HPI  Depression: seen January 2020 and phq 9 was very high at 47, he has a long history of depression but was feeling more down and hopeless, we started him on Zofot 50 mg he has been more compliant with medication since he started to take it in am's with other medications,  Phq 9 has improved,. He is back at work - part time and it seems to help his mood also , he states sitting at home was not good for him. No suicidal thoughts or ideation  HTN: DBP was elevated two visits in a row so we adjusted dose of lisinopril from 20 to 40 mg but now bp is low, he states gets a little dizzy when working in his yard,we decreased dose to 30 mg on his last visit and he is still getting dizzy when he stands up quickly, we will try going down again to 20 mg and monitor, if goes up consider changing to another drug.  He denies  chest pain or palpitation.   Chronic bronchitis: he is still coughing but not daily, cannot afford Breo, he stopped medication on his own, quit smoking in 2016 and seems to be getting better. No sob or wheezing. Working part time and has been more active, also able to mow his own yard with a push mower   Hyperlipidemia/Atherosclerosis of Aorta: taking aspirin, atorvastatin and denies myalgia. LDL below 100  Insomnia: taking trazodone prn , he states medication works well for him and no side effects   History of prostate cancer: last PSA was normal seeing Dr. Baruch Gouty every 6 months now. Going back for labs today  Patient Active Problem List   Diagnosis Date Noted  . Lung nodules 09/04/2018  . Chronic bronchitis ([place]) 04/23/2017  . Atherosclerosis of aorta ([place]) 04/23/2017  . Coronary artery disease due to calcified coronary lesion 04/23/2017  . Glaucoma,  left eye 08/04/2016  . Prostate cancer ([place]) 07/15/2016  . History of prostatectomy 07/15/2016  . Vitamin D deficiency 05/31/2015  . Hyperlipidemia LDL goal <100 05/31/2015  . Seasonal allergic rhinitis 05/31/2015  . Chronic pain of both shoulders 05/31/2015  . Family history of malignant neoplasm of prostate 07/26/2012  . Hematuria, microscopic 07/26/2012    Past Surgical History:  Procedure Laterality Date  . COLONOSCOPY  2010   Dr Sula Rumple  . excision skin cyst  06-19-14   Dr. Festus Aloe CYST  . PELVIC LYMPH NODE DISSECTION N/A 07/15/2016   Procedure: PELVIC LYMPH NODE DISSECTION;  Surgeon: Nickie Retort, MD;  Location: [facility] ORS;  Service: Urology;  Laterality: N/A;  . ROBOT ASSISTED LAPAROSCOPIC RADICAL PROSTATECTOMY N/A 07/15/2016   Procedure: ROBOTIC ASSISTED LAPAROSCOPIC RADICAL PROSTATECTOMY;  Surgeon: Nickie Retort, MD;  Location: [facility] ORS;  Service: Urology;  Laterality: N/A;    Family History  Problem Relation Age of Onset  . Heart disease Mother   . Heart attack Mother   . Diabetes Father   . Heart attack Father   . Dementia Father   . Cancer Sister        Breast  . Cancer Brother        Prostate  . Healthy Sister   . Stroke Brother     Social  History   Socioeconomic History  . Marital status: Widowed    Spouse name: Not on file  . Number of children: 0  . Years of education: Not on file  . Highest education level: 12th grade  Occupational History  . Occupation: custodian     Comment: part time   Social Needs  . Financial resource strain: Somewhat hard  . Food insecurity    Worry: Never true    Inability: Never true  . Transportation needs    Medical: No    Non-medical: No  Tobacco Use  . Smoking status: Former Smoker    Packs/day: 0.50    Years: 40.00    Pack years: 20.00    Types: Cigarettes    Quit date: 03/23/2016    Years since quitting: 2.9  . Smokeless tobacco: Never Used  . Tobacco comment: smoking cessation materials not  required  Substance and Sexual Activity  . Alcohol use: Yes    Alcohol/week: 0.0 standard drinks    Comment: social  . Drug use: No  . Sexual activity: Not Currently    Birth control/protection: Condom  Lifestyle  . Physical activity    Days per week: 0 days    Minutes per session: 0 min  . Stress: Only a little  Relationships  . Social connections    Talks on phone: More than three times a week    Gets together: Once a week    Attends religious service: Never    Active member of club or organization: No    Attends meetings of clubs or organizations: Never    Relationship status: Widowed  . Intimate partner violence    Fear of current or ex partner: No    Emotionally abused: No    Physically abused: No    Forced sexual activity: No  Other Topics Concern  . Not on file  Social History Narrative  . Not on file     Current Outpatient Medications:  .  aspirin 81 MG tablet, Take 81 mg by mouth daily., Disp: , Rfl:  .  atorvastatin (LIPITOR) 40 MG tablet, Take 1 tablet (40 mg total) by mouth daily. New cholesterol medication, Disp: 90 tablet, Rfl: 1 .  brimonidine-timolol (COMBIGAN) 0.2-0.5 % ophthalmic solution, Place 1 drop into both eyes every 12 (twelve) hours., Disp: , Rfl:  .  Cholecalciferol (VITAMIN D) 2000 units CAPS, Take 1 capsule (2,000 Units total) by mouth daily., Disp: 30 capsule, Rfl: 0 .  latanoprost (XALATAN) 0.005 % ophthalmic solution, Place 1 drop into both eyes at bedtime. , Disp: , Rfl:  .  lisinopril (ZESTRIL) 30 MG tablet, Take 1 tablet by mouth once daily, Disp: 30 tablet, Rfl: 0 .  sertraline (ZOLOFT) 50 MG tablet, Take 1 tablet (50 mg total) by mouth daily., Disp: 90 tablet, Rfl: 1 .  sildenafil (REVATIO) 20 MG tablet, Take 1 to 5 tablets PO daily prn, Disp: 30 tablet, Rfl: 3 .  traZODone (DESYREL) 100 MG tablet, Take 1 tablet (100 mg total) by mouth at bedtime., Disp: 90 tablet, Rfl: 0 .  triamcinolone cream (KENALOG) 0.1 %, Apply 1 application  topically 2 (two) times daily., Disp: 45 g, Rfl: 0  No Known Allergies  I personally reviewed active problem list, medication list, allergies, family history, social history with the patient/caregiver today.   ROS  Constitutional: Negative for fever or weight change.  Respiratory: Positive  for occasional cough but no  shortness of breath.   Cardiovascular: Negative for chest pain  or palpitations.  Gastrointestinal: Negative for abdominal pain, some heartburn intermittently , no bowel changes.  Musculoskeletal: Negative for gait problem or joint swelling.  Skin: Negative for rash.  Neurological: Positive for orthostatic changes but no  headache.  No other specific complaints in a complete review of systems (except as listed in HPI above).  Objective  Vitals:   03/01/19 0804  BP: 100/70  Pulse: 80  Resp: 16  Temp: (!) 97.1 F (36.2 C)  TempSrc: Temporal  SpO2: 98%  Weight: 186 lb 14.4 oz (84.8 kg)  Height: 5\' 9"  (1.753 m)    Body mass index is 27.6 kg/m.  Physical Exam  Constitutional: Patient appears well-developed and well-nourished. Overweight  No distress.  HEENT: head atraumatic, normocephalic, pupils equal and reactive to light,neck supple Cardiovascular: Normal rate, regular rhythm and normal heart sounds.  No murmur heard. No BLE edema. Pulmonary/Chest: Effort normal and breath sounds normal. No respiratory distress. Abdominal: Soft.  There is no tenderness. Psychiatric: Patient has a normal mood and affect. behavior is normal. Judgment and thought content normal.  PHQ2/9: Depression screen [facility] 2/9 03/01/2019 02/21/2019 11/28/2018 08/29/2018 07/18/2018  Decreased Interest 0 1 1 2 2   Down, Depressed, Hopeless 0 1 1 1 3   PHQ - 2 Score 0 2 2 3 5   Altered sleeping 0 0 1 0 3  Tired, decreased energy 0 0 0 0 3  Change in appetite 0 0 0 1 1  Feeling bad or failure about yourself  0 0 1 1 1   Trouble concentrating 0 0 0 0 0  Moving slowly or fidgety/restless 0 1 0 0 0   Suicidal thoughts 0 0 0 0 0  PHQ-9 Score 0 3 4 5 13   Difficult doing work/chores - Not difficult at all Somewhat difficult Not difficult at all Very difficult  Some recent data might be hidden    phq 9 is negative   Fall Risk: Fall Risk  03/01/2019 02/21/2019 11/28/2018 08/29/2018 07/18/2018  Falls in the past year? 0 0 0 0 0  Number falls in past yr: 0 0 0 0 0  Injury with Fall? 0 0 0 0 0  Risk for fall due to : - - - - -  Risk for fall due to: Comment - - - - -  Follow up - Falls prevention discussed - - -    Functional Status Survey: Is the patient deaf or have difficulty hearing?: No Does the patient have difficulty seeing, even when wearing glasses/contacts?: No Does the patient have difficulty concentrating, remembering, or making decisions?: No Does the patient have difficulty walking or climbing stairs?: No Does the patient have difficulty dressing or bathing?: No Does the patient have difficulty doing errands alone such as visiting a doctor's office or shopping?: No    Assessment & Plan  1. Essential hypertension  BP is towards low end of normal we will decrease lisinopril to 20 mg daily  - lisinopril (ZESTRIL) 20 MG tablet; Take 1 tablet (20 mg total) by mouth daily.  Dispense: 90 tablet; Refill: 0  2. Mild  episode of recurrent major depressive disorder (HCC)  - sertraline (ZOLOFT) 50 MG tablet; Take 1 tablet (50 mg total) by mouth daily.  Dispense: 90 tablet; Refill: 1  3. Adjustment insomnia  - traZODone (DESYREL) 100 MG tablet; Take 1 tablet (100 mg total) by mouth at bedtime.  Dispense: 90 tablet; Refill: 0  4. Centrilobular emphysema ([place])  He quit smoking, he still has occasional cough but  no SOB  5. History of prostatectomy  - sildenafil (VIAGRA) 100 MG tablet; Take 1 tablet (100 mg total) by mouth daily as needed for erectile dysfunction.  Dispense: 30 tablet; Refill: 0  6. Atherosclerosis of aorta (HCC)  On statin and aspirin   7. Simple chronic  bronchitis ([place])

## 2019-03-02 LAB — PSA: Prostate Specific Ag, Serum: <0.1 ng/mL (ref 0.0–4.0)

## 2019-03-08 ENCOUNTER — Encounter: Payer: Self-pay | Admitting: Urology

## 2019-03-08 ENCOUNTER — Other Ambulatory Visit: Payer: Self-pay

## 2019-03-08 ENCOUNTER — Ambulatory Visit: Payer: Medicare HMO | Admitting: Urology

## 2019-03-08 VITALS — BP 98/65 | HR 87 | Ht 69.0 in | Wt 186.0 lb

## 2019-03-08 DIAGNOSIS — N529 Male erectile dysfunction, unspecified: Secondary | ICD-10-CM

## 2019-03-08 DIAGNOSIS — C61 Malignant neoplasm of prostate: Secondary | ICD-10-CM | POA: Diagnosis not present

## 2019-03-08 NOTE — Patient Instructions (Signed)
Sildenafil tablets (Erectile Dysfunction) What is this medicine? SILDENAFIL (sil DEN a fil) is used to treat erection problems in men. This medicine may be used for other purposes; ask your health care provider or pharmacist if you have questions. COMMON BRAND NAME(S): Viagra What should I tell my health care provider before I take this medicine? They need to know if you have any of these conditions:  bleeding disorders  eye or vision problems, including a rare inherited eye disease called retinitis pigmentosa  anatomical deformation of the penis, Peyronie's disease, or history of priapism (painful and prolonged erection)  heart disease, angina, a history of heart attack, irregular heart beats, or other heart problems  high or low blood pressure  history of blood diseases, like sickle cell anemia or leukemia  history of stomach bleeding  kidney disease  liver disease  stroke  an unusual or allergic reaction to sildenafil, other medicines, foods, dyes, or preservatives  pregnant or trying to get pregnant  breast-feeding How should I use this medicine? Take this medicine by mouth with a glass of water. Follow the directions on the prescription label. The dose is usually taken 1 hour before sexual activity. You should not take the dose more than once per day. Do not take your medicine more often than directed. Talk to your pediatrician regarding the use of this medicine in children. This medicine is not used in children for this condition. Overdosage: If you think you have taken too much of this medicine contact a poison control center or emergency room at once. NOTE: This medicine is only for you. Do not share this medicine with others. What if I miss a dose? This does not apply. Do not take double or extra doses. What may interact with this medicine? Do not take this medicine with any of the following medications:  cisapride  nitrates like amyl nitrite, isosorbide  dinitrate, isosorbide mononitrate, nitroglycerin  riociguat This medicine may also interact with the following medications:  antiviral medicines for HIV or AIDS  bosentan  certain medicines for benign prostatic hyperplasia (BPH)  certain medicines for blood pressure  certain medicines for fungal infections like ketoconazole and itraconazole  cimetidine  erythromycin  rifampin This list may not describe all possible interactions. Give your health care provider a list of all the medicines, herbs, non-prescription drugs, or dietary supplements you use. Also tell them if you smoke, drink alcohol, or use illegal drugs. Some items may interact with your medicine. What should I watch for while using this medicine? If you notice any changes in your vision while taking this drug, call your doctor or health care professional as soon as possible. Stop using this medicine and call your health care provider right away if you have a loss of sight in one or both eyes. Contact your doctor or health care professional right away if you have an erection that lasts longer than 4 hours or if it becomes painful. This may be a sign of a serious problem and must be treated right away to prevent permanent damage. If you experience symptoms of nausea, dizziness, chest pain or arm pain upon initiation of sexual activity after taking this medicine, you should refrain from further activity and call your doctor or health care professional as soon as possible. Do not drink alcohol to excess (examples, 5 glasses of wine or 5 shots of whiskey) when taking this medicine. When taken in excess, alcohol can increase your chances of getting a headache or getting dizzy, increasing  your heart rate or lowering your blood pressure. Using this medicine does not protect you or your partner against HIV infection (the virus that causes AIDS) or other sexually transmitted diseases. What side effects may I notice from receiving this  medicine? Side effects that you should report to your doctor or health care professional as soon as possible:  allergic reactions like skin rash, itching or hives, swelling of the face, lips, or tongue  breathing problems  changes in hearing  changes in vision  chest pain  fast, irregular heartbeat  prolonged or painful erection  seizures Side effects that usually do not require medical attention (report to your doctor or health care professional if they continue or are bothersome):  back pain  dizziness  flushing  headache  indigestion  muscle aches  nausea  stuffy or runny nose This list may not describe all possible side effects. Call your doctor for medical advice about side effects. You may report side effects to FDA at 1-800-FDA-1088. Where should I keep my medicine? Keep out of reach of children. Store at room temperature between 15 and 30 degrees C (59 and 86 degrees F). Throw away any unused medicine after the expiration date. NOTE: This sheet is a summary. It may not cover all possible information. If you have questions about this medicine, talk to your doctor, pharmacist, or health care provider.  2020 Elsevier/Gold Standard (2015-06-12 12:00:25)  

## 2019-03-08 NOTE — Progress Notes (Signed)
   03/08/2019 9:53 AM   Ernest Sweeney 1951-01-28 [id number]  Reason for visit: Follow up prostate cancer  I saw [name] in urology clinic today for prostate cancer follow-up.  To briefly summarize, he is a healthy 68 year old African-American male that underwent a robotic prostatectomy and bilateral lymphadenectomy in January 2018 with Dr. Pilar Jarvis.  Final pathology showed Gleason score 3+4 = 7 prostate cancer stage pT3aN0 Mx, there was focal extraprostatic extension and perineural invasion, margins were negative.  Pretreatment PSA was 6, and PSA after surgery was undetectable.  He developed biochemical recurrence in August 2019 with a PSA of 0.2 x2, and underwent salvage radiation to the prostatic fossa and 6 months of ADT(03/08/2018).  His most recent PSA 03/01/2019 is undetectable.  Overall, he is doing well.    His energy has returned now that he is off ADT.  He is not having spontaneous erections, however was recently prescribed 100 mg sildenafil by PCP, though has not tried this yet.  He has minimal stress urinary incontinence.  We had a long conversation again about biochemical recurrence after prostatectomy, and the need for close PSA surveillance after salvage radiation.  We also reviewed the plan of monitoring the PSA, with plan for resuming ADT in the future if the PSA were to increase.  RTC 6 months with PSA prior  A total of 15 minutes were spent face-to-face with the patient, greater than 50% was spent in patient education, counseling, and coordination of care regarding prostate cancer, PSA monitoring, and ED.  Nickolas Madrid, MD 03/08/2019

## 2019-03-29 ENCOUNTER — Other Ambulatory Visit: Payer: Self-pay

## 2019-03-30 ENCOUNTER — Inpatient Hospital Stay: Payer: Medicare HMO | Attending: Radiation Oncology

## 2019-03-30 ENCOUNTER — Other Ambulatory Visit: Payer: Self-pay

## 2019-03-30 DIAGNOSIS — C61 Malignant neoplasm of prostate: Secondary | ICD-10-CM | POA: Insufficient documentation

## 2019-03-30 LAB — PSA: Prostatic Specific Antigen: <0.01 ng/mL (ref 0.00–4.00)

## 2019-04-06 ENCOUNTER — Other Ambulatory Visit: Payer: Self-pay

## 2019-04-06 ENCOUNTER — Ambulatory Visit
Admission: RE | Admit: 2019-04-06 | Discharge: 2019-04-06 | Disposition: A | Discharge status: Home / Self Care | Payer: Medicare HMO | Source: Ambulatory Visit | Attending: Radiation Oncology | Admitting: Radiation Oncology

## 2019-04-06 ENCOUNTER — Encounter: Payer: Self-pay | Admitting: Radiation Oncology

## 2019-04-06 VITALS — BP 145/95 | HR 98 | Temp 95.7°F | Resp 18 | Wt 187.2 lb

## 2019-04-06 DIAGNOSIS — C61 Malignant neoplasm of prostate: Secondary | ICD-10-CM | POA: Diagnosis not present

## 2019-04-06 DIAGNOSIS — Z923 Personal history of irradiation: Secondary | ICD-10-CM | POA: Insufficient documentation

## 2019-04-06 NOTE — Progress Notes (Signed)
Radiation Oncology Follow up Note  Name: Ernest Sweeney   Date:   04/06/2019 MRN:  [id number] DOB: 01-Aug-1950    This 68 y.o. male presents to the clinic today for 8-month follow-up status post salvage radiation therapy after robotic prostatectomy for Gleason 7 adenocarcinoma stage IIIb.  REFERRING PROVIDER: [name], MD  HPI: Patient is a 68 year old male now at 11 months having completed salvage radiation therapy after biochemical failure from robotic prostatectomy for Gleason 7 (3+4) adenocarcinoma the prostate (T3 N0 M0).  He is seen today in routine follow-up he is seen today in routine follow-up is doing well specifically denies any increased lower urinary tract symptoms or diarrhea.  His most recent PSA is less than [id number].  COMPLICATIONS OF TREATMENT: none  FOLLOW UP COMPLIANCE: keeps appointments   PHYSICAL EXAM:  BP (!) 145/95   Pulse 98   Temp (!) 95.7 F (35.4 C)   Resp 18   Wt 187 lb 3.2 oz (84.9 kg)   BMI 27.64 kg/m  Well-developed well-nourished patient in NAD. HEENT reveals PERLA, EOMI, discs not visualized.  Oral cavity is clear. No oral mucosal lesions are identified. Neck is clear without evidence of cervical or supraclavicular adenopathy. Lungs are clear to A&P. Cardiac examination is essentially unremarkable with regular rate and rhythm without murmur rub or thrill. Abdomen is benign with no organomegaly or masses noted. Motor sensory and DTR levels are equal and symmetric in the upper and lower extremities. Cranial nerves II through XII are grossly intact. Proprioception is intact. No peripheral adenopathy or edema is identified. No motor or sensory levels are noted. Crude visual fields are within normal range.  RADIOLOGY RESULTS: No current films for review  PLAN: Present time patient is under excellent biochemical control of his prostate cancer.  He is no longer androgen deprivation therapy.  I am pleased with his overall progress.  Of asked to see him back  in 1 year for follow-up with a PSA prior to that visit.  Patient knows to call sooner with any concerns.  I would like to take this opportunity to thank you for allowing me to participate in the care of your patient.Noreene Filbert, MD

## 2019-05-02 ENCOUNTER — Other Ambulatory Visit: Payer: Self-pay

## 2019-05-02 ENCOUNTER — Ambulatory Visit (INDEPENDENT_AMBULATORY_CARE_PROVIDER_SITE_OTHER): Payer: Medicare HMO

## 2019-05-02 DIAGNOSIS — Z23 Encounter for immunization: Secondary | ICD-10-CM | POA: Diagnosis not present

## 2019-06-01 ENCOUNTER — Encounter: Payer: Self-pay | Admitting: Family Medicine

## 2019-06-02 ENCOUNTER — Other Ambulatory Visit: Payer: Self-pay

## 2019-06-02 ENCOUNTER — Encounter: Payer: Self-pay | Admitting: Family Medicine

## 2019-06-02 ENCOUNTER — Ambulatory Visit (INDEPENDENT_AMBULATORY_CARE_PROVIDER_SITE_OTHER): Payer: Medicare HMO | Admitting: Family Medicine

## 2019-06-02 VITALS — BP 122/94 | HR 94 | Temp 96.9°F | Resp 16 | Ht 69.0 in | Wt 188.6 lb

## 2019-06-02 DIAGNOSIS — I10 Essential (primary) hypertension: Secondary | ICD-10-CM

## 2019-06-02 DIAGNOSIS — J432 Centrilobular emphysema: Secondary | ICD-10-CM | POA: Diagnosis not present

## 2019-06-02 DIAGNOSIS — R079 Chest pain, unspecified: Secondary | ICD-10-CM

## 2019-06-02 DIAGNOSIS — I2510 Atherosclerotic heart disease of native coronary artery without angina pectoris: Secondary | ICD-10-CM

## 2019-06-02 DIAGNOSIS — E559 Vitamin D deficiency, unspecified: Secondary | ICD-10-CM

## 2019-06-02 DIAGNOSIS — I700 Atherosclerosis of aorta: Secondary | ICD-10-CM

## 2019-06-02 DIAGNOSIS — J302 Other seasonal allergic rhinitis: Secondary | ICD-10-CM

## 2019-06-02 DIAGNOSIS — IMO0001 Reserved for inherently not codable concepts without codable children: Secondary | ICD-10-CM

## 2019-06-02 DIAGNOSIS — F1020 Alcohol dependence, uncomplicated: Secondary | ICD-10-CM

## 2019-06-02 DIAGNOSIS — R739 Hyperglycemia, unspecified: Secondary | ICD-10-CM | POA: Diagnosis not present

## 2019-06-02 DIAGNOSIS — F320 Major depressive disorder, single episode, mild: Secondary | ICD-10-CM | POA: Diagnosis not present

## 2019-06-02 DIAGNOSIS — D708 Other neutropenia: Secondary | ICD-10-CM | POA: Diagnosis not present

## 2019-06-02 DIAGNOSIS — Z9079 Acquired absence of other genital organ(s): Secondary | ICD-10-CM

## 2019-06-02 DIAGNOSIS — R69 Illness, unspecified: Secondary | ICD-10-CM | POA: Diagnosis not present

## 2019-06-02 MED ORDER — SILDENAFIL CITRATE 100 MG PO TABS: 100.0000 mg | ORAL_TABLET | Freq: Every day | ORAL | 0 refills | Status: DC | PRN

## 2019-06-02 MED ORDER — FLUTICASONE PROPIONATE 50 MCG/ACT NA SUSP: 2.0000 | Freq: Every day | NASAL | 6 refills | Status: DC

## 2019-06-02 MED ORDER — LISINOPRIL 10 MG PO TABS: 10.0000 mg | ORAL_TABLET | Freq: Two times a day (BID) | ORAL | 0 refills | Status: DC

## 2019-06-02 NOTE — Progress Notes (Signed)
Name: Ernest Sweeney   MRN: [id number]    DOB: [birth year]   Date:06/02/2019       Progress Note  Subjective  Chief Complaint  Chief Complaint  Patient presents with  . Hypertension  . Allergic Rhinitis   . Hyperlipidemia    HPI  Chest pain: he sates he noticed chest discomfort while mowing his yard, twice since September, last episode was last week. He states it resolved when he stopped to rest. Associated with dizziness like he is going faint, heart seems to be skipping a beat. Not associated with nausea, vomiting, sob or diaphoresis. Seems to only lasts seconds but enough to make him feel uncomfortable. Last time he could not finish doing his yard.   Depression: seen January 2020 and phq 9 was very high at 64, he has a long history of depression but was feeling more down and hopeless, we started him on Zofot 50 mg  Phq 9 stopped Zofot because he read the side effects, he states he likes to drink on weekend, a pint and half and was afraid of side effects. He is still feeling depressed, and has been drinking more than usual since has been more isolated - feeling bored . Phq 9 is still positive   Alcohol use disorder: he always drank, used to beer when he was younger, but since his mid 20's he started to drink liquor, since his 83's he has been drinking heavier, mostly on weekends and worse since COVID-19, drinking a pint and a half of liquor on weekends and some beer, he feels bad about it and would like to quit, worries about his health and had to stop Zoloft because of alcohol use, does not want to see someone to help him quit at this time. Discussed AA meeting.   HTN:DBP was elevated two visits in a row so we adjusted dose of lisinopril from 20 to 40 mg but bp dropped and he felt dizzines, so he is down to 20 mg again, likely going up because of alcohol use, we will try 20 mg in am and 10 mg at night and monitor    Chronic bronchitis/Emphysema : he is still coughing but not daily,  cannot afford Breo, he stopped medication on his own, quit smoking in 2016 and seems to be getting better. No sob or wheezing. He states occasionally has a cough.    Hyperlipidemia/Atherosclerosis of Aorta: taking aspirin, atorvastatin and denies myalgia. LDL below 100, we will recheck labs and may need to adjust dose of medication   Insomnia: taking trazodone prn only , sleeping better lately   History of prostate cancer: last PSA was normal seeing Dr. Baruch Gouty every 6 months now.Unchanged  Patient Active Problem List   Diagnosis Date Noted  . Lung nodules 09/04/2018  . Chronic bronchitis ([place]) 04/23/2017  . Atherosclerosis of aorta ([place]) 04/23/2017  . Coronary artery disease due to calcified coronary lesion 04/23/2017  . Glaucoma, left eye 08/04/2016  . Prostate cancer ([place]) 07/15/2016  . History of prostatectomy 07/15/2016  . Vitamin D deficiency 05/31/2015  . Hyperlipidemia LDL goal <100 05/31/2015  . Seasonal allergic rhinitis 05/31/2015  . Chronic pain of both shoulders 05/31/2015  . Family history of malignant neoplasm of prostate 07/26/2012  . Hematuria, microscopic 07/26/2012    Past Surgical History:  Procedure Laterality Date  . COLONOSCOPY  2010   Dr Sula Rumple  . excision skin cyst  06-19-14   Dr. Festus Aloe CYST  . PELVIC LYMPH NODE DISSECTION N/A 07/15/2016  Procedure: PELVIC LYMPH NODE DISSECTION;  Surgeon: Nickie Retort, MD;  Location: [facility] ORS;  Service: Urology;  Laterality: N/A;  . ROBOT ASSISTED LAPAROSCOPIC RADICAL PROSTATECTOMY N/A 07/15/2016   Procedure: ROBOTIC ASSISTED LAPAROSCOPIC RADICAL PROSTATECTOMY;  Surgeon: Nickie Retort, MD;  Location: [facility] ORS;  Service: Urology;  Laterality: N/A;    Family History  Problem Relation Age of Onset  . Heart disease Mother   . Heart attack Mother   . Diabetes Father   . Heart attack Father   . Dementia Father   . Cancer Sister        Breast  . Cancer Brother        Prostate  . Healthy Sister    . Stroke Brother     Social History   Socioeconomic History  . Marital status: Widowed    Spouse name: Not on file  . Number of children: 0  . Years of education: Not on file  . Highest education level: 12th grade  Occupational History  . Occupation: custodian     Comment: part time   Social Needs  . Financial resource strain: Somewhat hard  . Food insecurity    Worry: Never true    Inability: Never true  . Transportation needs    Medical: No    Non-medical: No  Tobacco Use  . Smoking status: Former Smoker    Packs/day: 0.50    Years: 40.00    Pack years: 20.00    Types: Cigarettes    Quit date: 03/23/2016    Years since quitting: 3.1  . Smokeless tobacco: Never Used  . Tobacco comment: to stay quit   Substance and Sexual Activity  . Alcohol use: Yes    Alcohol/week: 8.0 standard drinks    Types: 8 Standard drinks or equivalent per week  . Drug use: No  . Sexual activity: Not Currently    Birth control/protection: Condom  Lifestyle  . Physical activity    Days per week: 1 day    Minutes per session: 90 min  . Stress: Only a little  Relationships  . Social connections    Talks on phone: More than three times a week    Gets together: Once a week    Attends religious service: Never    Active member of club or organization: No    Attends meetings of clubs or organizations: Never    Relationship status: Widowed  . Intimate partner violence    Fear of current or ex partner: No    Emotionally abused: No    Physically abused: No    Forced sexual activity: No  Other Topics Concern  . Not on file  Social History Narrative  . Not on file     Current Outpatient Medications:  .  aspirin 81 MG tablet, Take 81 mg by mouth daily., Disp: , Rfl:  .  atorvastatin (LIPITOR) 40 MG tablet, Take 1 tablet (40 mg total) by mouth daily. New cholesterol medication, Disp: 90 tablet, Rfl: 1 .  brimonidine-timolol (COMBIGAN) 0.2-0.5 % ophthalmic solution, Place 1 drop into both  eyes every 12 (twelve) hours., Disp: , Rfl:  .  Cholecalciferol (VITAMIN D) 2000 units CAPS, Take 1 capsule (2,000 Units total) by mouth daily., Disp: 30 capsule, Rfl: 0 .  latanoprost (XALATAN) 0.005 % ophthalmic solution, Place 1 drop into both eyes at bedtime. , Disp: , Rfl:  .  lisinopril (ZESTRIL) 10 MG tablet, Take 1-2 tablets (10-20 mg total) by mouth 2 (two) times daily.  20 mg in am and one in pm, Disp: 270 tablet, Rfl: 0 .  traZODone (DESYREL) 100 MG tablet, Take 1 tablet (100 mg total) by mouth at bedtime., Disp: 90 tablet, Rfl: 0 .  triamcinolone cream (KENALOG) 0.1 %, Apply 1 application topically 2 (two) times daily., Disp: 45 g, Rfl: 0 .  fluticasone (FLONASE) 50 MCG/ACT nasal spray, Place 2 sprays into both nostrils daily., Disp: 16 g, Rfl: 6 .  sildenafil (VIAGRA) 100 MG tablet, Take 1 tablet (100 mg total) by mouth daily as needed for erectile dysfunction., Disp: 30 tablet, Rfl: 0  No Known Allergies  I personally reviewed active problem list, medication list, allergies, family history, social history, health maintenance with the patient/caregiver today.   ROS  Constitutional: Negative for fever or weight change.  Respiratory: positive  for intermittent cough but no  shortness of breath.   Cardiovascular: Positive for chest pain and  palpitations.  Gastrointestinal: Negative for abdominal pain, no bowel changes.  Musculoskeletal: Negative for gait problem or joint swelling.  Skin: Negative for rash.  Neurological: Negative for dizziness or headache.  No other specific complaints in a complete review of systems (except as listed in HPI above).  Objective  Vitals:   06/02/19 0849  BP: (!) 122/94  Pulse: 94  Resp: 16  Temp: (!) 96.9 F (36.1 C)  TempSrc: Temporal  SpO2: 98%  Weight: 188 lb 9.6 oz (85.5 kg)  Height: 5\' 9"  (1.753 m)    Body mass index is 27.85 kg/m.  Physical Exam  Constitutional: Patient appears well-developed and well-nourished. No distress.   HEENT: head atraumatic, normocephalic, pupils equal and reactive to light Cardiovascular: Normal rate, regular rhythm and normal heart sounds.  No murmur heard. No BLE edema. Pulmonary/Chest: Effort normal and breath sounds normal. No respiratory distress. Abdominal: Soft.  There is no tenderness. Psychiatric: Patient has a normal mood and affect. behavior is normal. Judgment and thought content normal.  Recent Results (from the past 2160 hour(s))  PSA     Status: None   Collection Time: 03/30/19 10:10 AM  Result Value Ref Range   Prostatic Specific Antigen <0.01 0.00 - 4.00 ng/mL    Comment: (NOTE) While PSA levels of <=4.0 ng/ml are reported as reference range, some men with levels below 4.0 ng/ml can have prostate cancer and many men with PSA above 4.0 ng/ml do not have prostate cancer.  Other tests such as free PSA, age specific reference ranges, PSA velocity and PSA doubling time may be helpful especially in men less than 2 years old. Performed at [facility], [place] [address] [place], [place] [address]     PHQ2/9: Depression screen [facility] 2/9 06/02/2019 03/01/2019 02/21/2019 11/28/2018 08/29/2018  Decreased Interest 1 0 1 1 2   Down, Depressed, Hopeless 1 0 1 1 1   PHQ - 2 Score 2 0 2 2 3   Altered sleeping 0 0 0 1 0  Tired, decreased energy 0 0 0 0 0  Change in appetite 1 0 0 0 1  Feeling bad or failure about yourself  1 0 0 1 1  Trouble concentrating 1 0 0 0 0  Moving slowly or fidgety/restless 0 0 1 0 0  Suicidal thoughts 0 0 0 0 0  PHQ-9 Score 5 0 3 4 5   Difficult doing work/chores Somewhat difficult - Not difficult at all Somewhat difficult Not difficult at all  Some recent data might be hidden    phq 9 is positive   Fall  Risk: Fall Risk  06/02/2019 03/01/2019 02/21/2019 11/28/2018 08/29/2018  Falls in the past year? 0 0 0 0 0  Number falls in past yr: 0 0 0 0 0  Injury with Fall? 0 0 0 0 0  Risk for fall due to : - - - - -  Risk for fall due to: Comment - - - -  -  Follow up - - Falls prevention discussed - -     Functional Status Survey: Is the patient deaf or have difficulty hearing?: No Does the patient have difficulty seeing, even when wearing glasses/contacts?: No Does the patient have difficulty concentrating, remembering, or making decisions?: No Does the patient have difficulty walking or climbing stairs?: No Does the patient have difficulty dressing or bathing?: No Does the patient have difficulty doing errands alone such as visiting a doctor's office or shopping?: No    Assessment & Plan  1. Atherosclerosis of aorta (HCC)  - Lipid panel  2. Centrilobular emphysema ([place])   3. Major depression in Mild  ([place])   4. Vitamin D deficiency  Continue vitamin D supplementation   5. History of prostatectomy  - sildenafil (VIAGRA) 100 MG tablet; Take 1 tablet (100 mg total) by mouth daily as needed for erectile dysfunction.  Dispense: 30 tablet; Refill: 0  6. Other neutropenia (HCC)  - CBC with Differential/Platelet  7. Hyperglycemia  - Hemoglobin A1c  8. Atherosclerosis of native coronary artery of native heart without angina pectoris  - CRP High sensitivity  9. Seasonal allergic rhinitis, unspecified trigger  - fluticasone (FLONASE) 50 MCG/ACT nasal spray; Place 2 sprays into both nostrils daily.  Dispense: 16 g; Refill: 6  10. Chest pain in adult  - Ambulatory referral to Cardiology - CRP High sensitivity  Normal sinus rhythm on EKG in our office today   11. Hypercalcemia  - COMPLETE METABOLIC PANEL WITH GFR - Parathyroid hormone, intact (no Ca)  12. Alcoholism /alcohol abuse [facility])  Discussed AA meetings, referrals and he will think about it   13. Essential hypertension  - lisinopril (ZESTRIL) 10 MG tablet; Take 1-2 tablets (10-20 mg total) by mouth 2 (two) times daily. 20 mg in am and one in pm  Dispense: 270 tablet; Refill: 0

## 2019-06-05 ENCOUNTER — Other Ambulatory Visit: Payer: Self-pay | Admitting: Family Medicine

## 2019-06-05 ENCOUNTER — Telehealth: Payer: Self-pay | Admitting: Family Medicine

## 2019-06-05 DIAGNOSIS — I10 Essential (primary) hypertension: Secondary | ICD-10-CM

## 2019-06-05 NOTE — Telephone Encounter (Signed)
Pt states he is completely out of this medication but has some old 30mg  pills.  He wants to know should he take those until this RX can be sent in?

## 2019-06-05 NOTE — Telephone Encounter (Signed)
Requested medication (s) are due for refill today: no  Requested medication (s) are on the active medication list: no  Last refill:  03/01/2019  Future visit scheduled:yes  Notes to clinic:  Looks like the dose has been changed Review for refill   Requested Prescriptions  Pending Prescriptions Disp Refills   lisinopril (ZESTRIL) 20 MG tablet [Pharmacy Med Name: Lisinopril 20 MG Oral Tablet] 90 tablet 0    Sig: Take 1 tablet by mouth once daily     Cardiovascular:  ACE Inhibitors Failed - 06/05/2019  2:08 PM      Failed - Cr in normal range and within 180 days    Creat  Date Value Ref Range Status  11/28/2018 1.16 0.70 - 1.25 mg/dL Final    Comment:    For patients >53 years of age, the reference limit for Creatinine is approximately 13% higher for people identified as African-American. .          Failed - K in normal range and within 180 days    Potassium  Date Value Ref Range Status  11/28/2018 4.6 3.5 - 5.3 mmol/L Final  11/30/2012 3.9 3.5 - 5.1 mmol/L Final         Failed - Last BP in normal range    BP Readings from Last 1 Encounters:  06/02/19 (!) 122/94         Passed - Patient is not pregnant      Passed - Valid encounter within last 6 months    Recent Outpatient Visits          3 days ago Centrilobular emphysema [facility])   [facility] [name], MD   3 months ago Centrilobular emphysema [facility])   [facility] [name], MD   6 months ago Essential hypertension   [facility], Drue Stager, MD   9 months ago Moderate episode of recurrent major depressive disorder [facility])   [facility] [name], MD   10 months ago Essential hypertension   [facility] [name], MD      Future Appointments            In 3 months Ancil Boozer, Drue Stager, MD [facility] [place]   In 3 months Diamantina Providence, Herbert Seta, MD [place]    In 8 months  [facility]

## 2019-06-06 ENCOUNTER — Encounter: Payer: Self-pay | Admitting: Family Medicine

## 2019-06-15 DIAGNOSIS — R079 Chest pain, unspecified: Secondary | ICD-10-CM | POA: Diagnosis not present

## 2019-06-15 DIAGNOSIS — R739 Hyperglycemia, unspecified: Secondary | ICD-10-CM | POA: Diagnosis not present

## 2019-06-15 DIAGNOSIS — I700 Atherosclerosis of aorta: Secondary | ICD-10-CM | POA: Diagnosis not present

## 2019-06-15 DIAGNOSIS — D708 Other neutropenia: Secondary | ICD-10-CM | POA: Diagnosis not present

## 2019-06-15 DIAGNOSIS — I2510 Atherosclerotic heart disease of native coronary artery without angina pectoris: Secondary | ICD-10-CM | POA: Diagnosis not present

## 2019-06-16 LAB — COMPLETE METABOLIC PANEL WITH GFR
AG Ratio: 1.8 (calc) (ref 1.0–2.5)
ALT: 11 U/L (ref 9–46)
AST: 13 U/L (ref 10–35)
Albumin: 4.4 g/dL (ref 3.6–5.1)
Alkaline phosphatase (APISO): 61 U/L (ref 35–144)
BUN/Creatinine Ratio: 9 (calc) (ref 6–22)
BUN: 12 mg/dL (ref 7–25)
CO2: 29 mmol/L (ref 20–32)
Calcium: 10 mg/dL (ref 8.6–10.3)
Chloride: 103 mmol/L (ref 98–110)
Creat: 1.3 mg/dL — ABNORMAL HIGH (ref 0.70–1.25)
GFR, Est African American: 65 mL/min/{1.73_m2} (ref >=60)
GFR, Est Non African American: 56 mL/min/{1.73_m2} — ABNORMAL LOW (ref >=60)
Globulin: 2.5 g/dL (calc) (ref 1.9–3.7)
Glucose, Bld: 86 mg/dL (ref 65–99)
Potassium: 4.9 mmol/L (ref 3.5–5.3)
Sodium: 139 mmol/L (ref 135–146)
Total Bilirubin: 1.1 mg/dL (ref 0.2–1.2)
Total Protein: 6.9 g/dL (ref 6.1–8.1)

## 2019-06-16 LAB — CBC WITH DIFFERENTIAL/PLATELET
Absolute Monocytes: 298 cells/uL (ref 200–950)
Basophils Absolute: 29 cells/uL (ref 0–200)
Basophils Relative: 1.2 %
Eosinophils Absolute: 50 cells/uL (ref 15–500)
Eosinophils Relative: 2.1 %
HCT: 43.3 % (ref 38.5–50.0)
Hemoglobin: 14.4 g/dL (ref 13.2–17.1)
Lymphs Abs: 684 cells/uL — ABNORMAL LOW (ref 850–3900)
MCH: 32 pg (ref 27.0–33.0)
MCHC: 33.3 g/dL (ref 32.0–36.0)
MCV: 96.2 fL (ref 80.0–100.0)
MPV: 9.1 fL (ref 7.5–12.5)
Monocytes Relative: 12.4 %
Neutro Abs: 1339 cells/uL — ABNORMAL LOW (ref 1500–7800)
Neutrophils Relative %: 55.8 %
Platelets: 340 10*3/uL (ref 140–400)
RBC: 4.5 10*6/uL (ref 4.20–5.80)
RDW: 12.2 % (ref 11.0–15.0)
Total Lymphocyte: 28.5 %
WBC: 2.4 10*3/uL — ABNORMAL LOW (ref 3.8–10.8)

## 2019-06-16 LAB — HEMOGLOBIN A1C
Hgb A1c MFr Bld: 5.3 % of total Hgb (ref <5.7)
Mean Plasma Glucose: 105 (calc)
eAG (mmol/L): 5.8 (calc)

## 2019-06-16 LAB — LIPID PANEL
Cholesterol: 162 mg/dL (ref <200)
HDL: 68 mg/dL (ref >=40)
LDL Cholesterol (Calc): 80 mg/dL (calc)
Non-HDL Cholesterol (Calc): 94 mg/dL (calc) (ref <130)
Total CHOL/HDL Ratio: 2.4 (calc) (ref <5.0)
Triglycerides: 55 mg/dL (ref <150)

## 2019-06-16 LAB — HIGH SENSITIVITY CRP: hs-CRP: <0.3 mg/L

## 2019-06-16 LAB — PARATHYROID HORMONE, INTACT (NO CA): PTH: 27 pg/mL (ref 14–64)

## 2019-06-19 ENCOUNTER — Other Ambulatory Visit: Payer: Self-pay | Admitting: Family Medicine

## 2019-06-19 DIAGNOSIS — D708 Other neutropenia: Secondary | ICD-10-CM

## 2019-06-21 DIAGNOSIS — R69 Illness, unspecified: Secondary | ICD-10-CM | POA: Diagnosis not present

## 2019-06-21 DIAGNOSIS — R011 Cardiac murmur, unspecified: Secondary | ICD-10-CM | POA: Diagnosis not present

## 2019-06-21 DIAGNOSIS — E782 Mixed hyperlipidemia: Secondary | ICD-10-CM | POA: Diagnosis not present

## 2019-06-21 DIAGNOSIS — I208 Other forms of angina pectoris: Secondary | ICD-10-CM | POA: Diagnosis not present

## 2019-06-21 DIAGNOSIS — R0789 Other chest pain: Secondary | ICD-10-CM | POA: Diagnosis not present

## 2019-06-21 DIAGNOSIS — I10 Essential (primary) hypertension: Secondary | ICD-10-CM | POA: Diagnosis not present

## 2019-06-21 DIAGNOSIS — R0602 Shortness of breath: Secondary | ICD-10-CM | POA: Diagnosis not present

## 2019-06-30 NOTE — Telephone Encounter (Signed)
Pharmacy states they need clarification on the directions for the  lisinopril (ZESTRIL) 10 MG tablet    Route: Take 1-2 tablets (10-20 mg total) by mouth 2 (two) times daily. 20 mg in am and one in pm - Oral      Pharmacy states they called and had not heard anything.  Pt has one pill left.  Pt never piked this Rx up, they have been waiting to hear back.  Pharmacist states confusing to them.  Please call  [place] ([place]), [place] [facility] [place] Phone:  [phone]  Fax:  [phone]

## 2019-06-30 NOTE — Telephone Encounter (Signed)
The rx has two sets of instructions, they have been sending over instructions for correction.

## 2019-07-04 NOTE — Telephone Encounter (Signed)
Called pharmacy to clarify instruction on Lisinopril.

## 2019-07-05 DIAGNOSIS — I208 Other forms of angina pectoris: Secondary | ICD-10-CM | POA: Diagnosis not present

## 2019-07-05 DIAGNOSIS — R0602 Shortness of breath: Secondary | ICD-10-CM | POA: Diagnosis not present

## 2019-07-05 DIAGNOSIS — R011 Cardiac murmur, unspecified: Secondary | ICD-10-CM | POA: Diagnosis not present

## 2019-07-17 DIAGNOSIS — R0602 Shortness of breath: Secondary | ICD-10-CM | POA: Diagnosis not present

## 2019-07-17 DIAGNOSIS — E782 Mixed hyperlipidemia: Secondary | ICD-10-CM | POA: Diagnosis not present

## 2019-07-17 DIAGNOSIS — Z87891 Personal history of nicotine dependence: Secondary | ICD-10-CM | POA: Diagnosis not present

## 2019-07-17 DIAGNOSIS — I208 Other forms of angina pectoris: Secondary | ICD-10-CM | POA: Diagnosis not present

## 2019-07-17 DIAGNOSIS — R011 Cardiac murmur, unspecified: Secondary | ICD-10-CM | POA: Diagnosis not present

## 2019-07-17 DIAGNOSIS — I10 Essential (primary) hypertension: Secondary | ICD-10-CM | POA: Diagnosis not present

## 2019-07-28 DIAGNOSIS — D708 Other neutropenia: Secondary | ICD-10-CM | POA: Diagnosis not present

## 2019-07-28 DIAGNOSIS — H401132 Primary open-angle glaucoma, bilateral, moderate stage: Secondary | ICD-10-CM | POA: Diagnosis not present

## 2019-07-28 LAB — CBC WITH DIFFERENTIAL/PLATELET
Absolute Monocytes: 355 cells/uL (ref 200–950)
Basophils Absolute: 10 cells/uL (ref 0–200)
Basophils Relative: 0.4 %
Eosinophils Absolute: 50 cells/uL (ref 15–500)
Eosinophils Relative: 2 %
HCT: 43.8 % (ref 38.5–50.0)
Hemoglobin: 14.8 g/dL (ref 13.2–17.1)
Lymphs Abs: 830 cells/uL — ABNORMAL LOW (ref 850–3900)
MCH: 32.5 pg (ref 27.0–33.0)
MCHC: 33.8 g/dL (ref 32.0–36.0)
MCV: 96.1 fL (ref 80.0–100.0)
MPV: 9.1 fL (ref 7.5–12.5)
Monocytes Relative: 14.2 %
Neutro Abs: 1255 cells/uL — ABNORMAL LOW (ref 1500–7800)
Neutrophils Relative %: 50.2 %
Platelets: 320 10*3/uL (ref 140–400)
RBC: 4.56 10*6/uL (ref 4.20–5.80)
RDW: 12.1 % (ref 11.0–15.0)
Total Lymphocyte: 33.2 %
WBC: 2.5 10*3/uL — ABNORMAL LOW (ref 3.8–10.8)

## 2019-08-23 DIAGNOSIS — Z23 Encounter for immunization: Secondary | ICD-10-CM | POA: Diagnosis not present

## 2019-08-31 ENCOUNTER — Telehealth: Payer: Self-pay | Admitting: *Deleted

## 2019-08-31 DIAGNOSIS — Z87891 Personal history of nicotine dependence: Secondary | ICD-10-CM

## 2019-08-31 NOTE — Telephone Encounter (Signed)
 (  08/31/2019) Patient has been notified that lung cancer screening CT scan is due currently or will be in near future. Confirmed that patient is within the appropriate age range, and asymptomatic. Patient denies illness that would prevent curative treatment for lung cancer if found. Verified smoking history (Former Smoker since 2016, 0.5 ppd). Patient is agreeable for CT scan being scheduled, says he works @ 5 pm so prefers scan be scheduled in the morning  SRW

## 2019-09-01 NOTE — Addendum Note (Signed)
Addended by: Lieutenant Diego on: 09/01/2019 11:54 AM   Modules accepted: Orders

## 2019-09-01 NOTE — Telephone Encounter (Signed)
Smoking history: former, quit 03/23/16, 30 pack year

## 2019-09-04 ENCOUNTER — Other Ambulatory Visit: Payer: Self-pay

## 2019-09-04 ENCOUNTER — Encounter: Payer: Self-pay | Admitting: Family Medicine

## 2019-09-04 ENCOUNTER — Ambulatory Visit (INDEPENDENT_AMBULATORY_CARE_PROVIDER_SITE_OTHER): Payer: Medicare HMO | Admitting: Family Medicine

## 2019-09-04 VITALS — BP 118/90 | HR 100 | Temp 97.3°F | Resp 16 | Ht 69.0 in | Wt 186.3 lb

## 2019-09-04 DIAGNOSIS — Z9079 Acquired absence of other genital organ(s): Secondary | ICD-10-CM

## 2019-09-04 DIAGNOSIS — I700 Atherosclerosis of aorta: Secondary | ICD-10-CM | POA: Diagnosis not present

## 2019-09-04 DIAGNOSIS — I10 Essential (primary) hypertension: Secondary | ICD-10-CM

## 2019-09-04 DIAGNOSIS — D708 Other neutropenia: Secondary | ICD-10-CM

## 2019-09-04 DIAGNOSIS — E559 Vitamin D deficiency, unspecified: Secondary | ICD-10-CM | POA: Diagnosis not present

## 2019-09-04 DIAGNOSIS — F320 Major depressive disorder, single episode, mild: Secondary | ICD-10-CM

## 2019-09-04 DIAGNOSIS — IMO0001 Reserved for inherently not codable concepts without codable children: Secondary | ICD-10-CM

## 2019-09-04 DIAGNOSIS — F1020 Alcohol dependence, uncomplicated: Secondary | ICD-10-CM

## 2019-09-04 DIAGNOSIS — J432 Centrilobular emphysema: Secondary | ICD-10-CM

## 2019-09-04 DIAGNOSIS — R69 Illness, unspecified: Secondary | ICD-10-CM | POA: Diagnosis not present

## 2019-09-04 LAB — CBC WITH DIFFERENTIAL/PLATELET
Absolute Monocytes: 364 cells/uL (ref 200–950)
Basophils Absolute: 20 cells/uL (ref 0–200)
Basophils Relative: 0.7 %
Eosinophils Absolute: 101 cells/uL (ref 15–500)
Eosinophils Relative: 3.6 %
HCT: 45.7 % (ref 38.5–50.0)
Hemoglobin: 15.5 g/dL (ref 13.2–17.1)
Lymphs Abs: 812 cells/uL — ABNORMAL LOW (ref 850–3900)
MCH: 32.7 pg (ref 27.0–33.0)
MCHC: 33.9 g/dL (ref 32.0–36.0)
MCV: 96.4 fL (ref 80.0–100.0)
MPV: 9.4 fL (ref 7.5–12.5)
Monocytes Relative: 13 %
Neutro Abs: 1504 cells/uL (ref 1500–7800)
Neutrophils Relative %: 53.7 %
Platelets: 326 10*3/uL (ref 140–400)
RBC: 4.74 10*6/uL (ref 4.20–5.80)
RDW: 12.4 % (ref 11.0–15.0)
Total Lymphocyte: 29 %
WBC: 2.8 10*3/uL — ABNORMAL LOW (ref 3.8–10.8)

## 2019-09-04 MED ORDER — LISINOPRIL 10 MG PO TABS: 10.0000 mg | ORAL_TABLET | Freq: Two times a day (BID) | ORAL | 0 refills | Status: DC

## 2019-09-04 MED ORDER — ROSUVASTATIN CALCIUM 40 MG PO TABS: 40.0000 mg | ORAL_TABLET | Freq: Every day | ORAL | 1 refills | Status: DC

## 2019-09-04 NOTE — Patient Instructions (Signed)
Alcohol Abuse and Nutrition Alcohol abuse is any pattern of alcohol consumption that harms your health, relationships, or work. Alcohol abuse can cause poor nutrition (malnutrition or malnourishment) and a lack of nutrients (nutrient deficiencies), which can lead to more complications. Alcohol abuse brings malnutrition and nutrient deficiencies in two ways:  It causes your liver to work abnormally. This affects how your body divides (breaks down) and absorbs nutrients from food.  It causes you to eat poorly. Many people who abuse alcohol do not eat enough carbohydrates, protein, fat, vitamins, and minerals. Nutrients that are commonly lacking (deficient) in people who abuse alcohol include:  Vitamins. ? Vitamin A. This is needed for your vision, metabolism, and ability to fight off infections (immunity). ? B vitamins. These include folate, thiamine, and niacin. These are needed for new cell growth. ? Vitamin C. This plays an important role in wound healing, immunity, and helping your body to absorb iron. ? Vitamin D. This is necessary for your body to absorb and use calcium. It is produced by your liver, but you can also get it from food and from sun exposure.  Minerals. ? Calcium. This is needed for healthy bones as well as heart and blood vessel (cardiovascular) function. ? Iron. This is important for blood, muscle, and nervous system functioning. ? Magnesium. This plays an important role in muscle and nerve function, and it helps to control blood sugar and blood pressure. ? Zinc. This is important for the normal functioning of your nervous system and digestive system (gastrointestinal tract). If you think that you have an alcohol dependency problem, or if it is hard to stop drinking because you feel sick or different when you do not use alcohol, talk with your health care provider or another health professional about where to get help. Nutrition is an essential factor in therapy for alcohol  abuse. Your health care provider or diet and nutrition specialist (dietitian) will work with you to design a plan that can help to restore nutrients to your body and prevent the risk of complications. What is my plan? Your dietitian may develop a specific eating plan that is based on your condition and any other problems that you have. An eating plan will commonly include:  A balanced diet. ? Grains: 6-8 oz (170-227 g) a day. Examples of 1 oz of whole grains include 1 cup of whole-wheat cereal,  cup of brown rice, or 1 slice of whole-wheat bread. ? Vegetables: 2-3 cups a day. Examples of 1 cup of vegetables include 2 medium carrots, 1 large tomato, or 2 stalks of celery. ? Fruits: 1-2 cups a day. Examples of 1 cup of fruit include 1 large banana, 1 small apple, 8 large strawberries, or 1 large orange. ? Meat and other protein: 5-6 oz (142-170 g) a day.  A cut of meat or fish that is the size of a deck of cards is about 3-4 oz.  Foods that provide 1 oz of protein include 1 egg,  cup of nuts or seeds, or 1 tablespoon (16 g) of peanut butter. ? Dairy: 2-3 cups a day. Examples of 1 cup of dairy include 8 oz (230 mL) of milk, 8 oz (230 g) of yogurt, or 1 oz (44 g) of natural cheese.  Vitamin and mineral supplements. What are tips for following this plan?  Eat frequent meals and snacks. Try to eat 5-6 small meals each day.  Take vitamin or mineral supplements as recommended by your dietitian.  If you are malnourished  or if your dietitian recommends it: ? You may follow a high-protein, high-calorie diet. This may include:  2,000-3,000 calories (kilocalories) a day.  70-100 g (grams) of protein a day. ? You may be directed to follow a diet that includes a complete nutritional supplement beverage. This can help to restore calories, protein, and vitamins to your body. Depending on your condition, you may be advised to consume this beverage instead of your meals or in addition to them.  Certain  medicines may cause changes in your appetite, taste, and weight. Work with your health care provider and dietitian to make any changes to your medicines and eating plan.  If you are unable to take in enough food and calories by mouth, your health care provider may recommend a feeding tube. This tube delivers nutritional supplements directly to your stomach. Recommended foods  Eat foods that are high in molecules that prevent oxygen from reacting with your food (antioxidants). These foods include grapes, berries, nuts, green tea, and dark green or orange vegetables. Eating these can help to prevent some of the stress that is placed on your liver by consuming alcohol.  Eat a variety of fresh fruits and vegetables each day. This will help you to get fiber and vitamins in your diet.  Drink plenty of water and other clear fluids, such as apple juice and broth. Try to drink at least 48-64 oz (1.5-2 L) of water a day.  Include foods fortified with vitamins and minerals in your diet. Commonly fortified foods include milk, orange juice, cereal, and bread.  Eat a variety of foods that are high in omega-3 and omega-6 fatty acids. These include fish, nuts and seeds, and soybeans. These foods may help your liver to recover and may also stabilize your mood.  If you are a vegetarian: ? Eat a variety of protein-rich foods. ? Pair whole grains with plant-based proteins at meals and snack time. For example, eat rice with beans, put peanut butter on whole-grain toast, or eat oatmeal with sunflower seeds. The items listed above may not be a complete list of foods and beverages you can eat. Contact a dietitian for more information. Foods to avoid  Avoid foods and drinks that are high in fat and sugar. Sugary drinks, salty snacks, and candy contain empty calories. This means that they lack important nutrients such as protein, fiber, and vitamins.  Avoid alcohol. This is the best way to avoid malnutrition due to  alcohol abuse. If you must drink, drink measured amounts. Measured drinking means limiting your intake to no more than 1 drink a day for nonpregnant women and 2 drinks a day for men. One drink equals 12 oz (355 mL) of beer, 5 oz (148 mL) of wine, or 1 oz (44 mL) of hard liquor.  Limit your intake of caffeine. Replace drinks like coffee and black tea with decaffeinated coffee and decaffeinated herbal tea. The items listed above may not be a complete list of foods and beverages you should avoid. Contact a dietitian for more information. Summary  Alcohol abuse can cause poor nutrition (malnutrition or malnourishment) and a lack of nutrients (nutrient deficiencies), which can lead to more health problems.  Common nutrient deficiencies include vitamin deficiencies (A, B, C, and D) and mineral deficiencies (calcium, iron, magnesium, and zinc).  Nutrition is an essential factor in therapy for alcohol abuse.  Your health care provider and dietitian can help you to develop a specific eating plan that includes a balanced diet plus vitamin and  mineral supplements. This information is not intended to replace advice given to you by your health care provider. Make sure you discuss any questions you have with your health care provider. Document Revised: 10/18/2018 Document Reviewed: 03/16/2017 Elsevier Patient Education  [place]. Alcohol Abuse and Dependence Information, Adult Alcohol is a widely available drug. People drink alcohol in different amounts. People who drink alcohol very often and in large amounts often have problems during and after drinking. They may develop what is called an alcohol use disorder. There are two main types of alcohol use disorders:  Alcohol abuse. This is when you use alcohol too much or too often. You may use alcohol to make yourself feel happy or to reduce stress. You may have a hard time setting a limit on the amount you drink.  Alcohol dependence. This is when you  use alcohol consistently for a period of time, and your body changes as a result. This can make it hard to stop drinking because you may start to feel sick or feel different when you do not use alcohol. These symptoms are known as withdrawal. How can alcohol abuse and dependence affect me? Alcohol abuse and dependence can have a negative effect on your life. Drinking too much can lead to addiction. You may feel like you need alcohol to function normally. You may drink alcohol before work in the morning, during the day, or as soon as you get home from work in the evening. These actions can result in:  Poor work performance.  Job loss.  Financial problems.  Car crashes or criminal charges from driving after drinking alcohol.  Problems in your relationships with friends and family.  Losing the trust and respect of coworkers, friends, and family. Drinking heavily over a long period of time can permanently damage your body and brain, and can cause lifelong health issues, such as:  Damage to your liver or pancreas.  Heart problems, high blood pressure, or stroke.  Certain cancers.  Decreased ability to fight infections.  Brain or nerve damage.  Depression.  Early (premature) death. If you are careless or you crave alcohol, it is easy to drink more than your body can handle (overdose). Alcohol overdose is a serious situation that requires hospitalization. It may lead to permanent injuries or death. What can increase my risk?  Having a family history of alcohol abuse.  Having depression or other mental health conditions.  Beginning to drink at an early age.  Binge drinking often.  Experiencing trauma, stress, and an unstable home life during childhood.  Spending time with people who drink often. What actions can I take to prevent or manage alcohol abuse and dependence?  Do not drink alcohol if: ? Your health care provider tells you not to drink. ? You are pregnant, may be  pregnant, or are planning to become pregnant.  If you drink alcohol: ? Limit how much you use to:  0-1 drink a day for women.  0-2 drinks a day for men. ? Be aware of how much alcohol is in your drink. In the [place] one drink equals one 12 oz bottle of beer (355 mL), one 5 oz glass of wine (148 mL), or one 1 oz glass of hard liquor (44 mL).  Stop drinking if you have been drinking too much. This can be very hard to do if you are used to abusing alcohol. If you begin to have withdrawal symptoms, talk with your health care provider or a person that you  trust. These symptoms may include anxiety, shaky hands, headache, nausea, sweating, or not being able to sleep.  Choose to drink nonalcoholic beverages in social gatherings and places where there may be alcohol. Activity  Spend more time on activities that you enjoy that do not involve alcohol, like hobbies or exercise.  Find healthy ways to cope with stress, such as exercise, meditation, or spending time with people you care about. General information  Talk to your family, coworkers, and friends about supporting you in your efforts to stop drinking. If they drink, ask them not to drink around you. Spend more time with people who do not drink alcohol.  If you think that you have an alcohol dependency problem: ? Tell friends or family about your concerns. ? Talk with your health care provider or another health professional about where to get help. ? Work with a Transport planner and a Regulatory affairs officer. ? Consider joining a support group for people who struggle with alcohol abuse and dependence. Where to find support   Your health care provider.  SMART Recovery: www.smartrecovery.org Therapy and support groups  Local treatment centers or chemical dependency counselors.  Local AA groups in your community: NicTax.com.pt Where to find more information  Centers for Disease Control and Prevention: http://www.wolf.info/  National Institute on  Alcohol Abuse and Alcoholism: http://www.bradshaw.com/  Alcoholics Anonymous (AA): [employer]Tax.com.pt Contact a health care provider if:  You drank more or for longer than you intended on more than one occasion.  You tried to stop drinking or to cut back on how much you drink, but you were not able to.  You often drink to the point of vomiting or passing out.  You want to drink so badly that you cannot think about anything else.  You have problems in your life due to drinking, but you continue to drink.  You keep drinking even though you feel anxious, depressed, or have experienced memory loss.  You have stopped doing the things you used to enjoy in order to drink.  You have to drink more than you used to in order to get the effect you want.  You experience anxiety, sweating, nausea, shakiness, and trouble sleeping when you try to stop drinking. Get help right away if:  You have thoughts about hurting yourself or others.  You have serious withdrawal symptoms, including: ? Confusion. ? Racing heart. ? High blood pressure. ? Fever. If you ever feel like you may hurt yourself or others, or have thoughts about taking your own life, get help right away. You can go to your nearest emergency department or call:  Your local emergency services (911 in the [place]  A suicide crisis helpline, such as the [place] at 7797030971. This is open 24 hours a day. Summary  Alcohol abuse and dependence can have a negative effect on your life. Drinking too much or too often can lead to addiction.  If you drink alcohol, limit how much you use.  If you are having trouble keeping your drinking under control, find ways to change your behavior. Hobbies, calming activities, exercise, or support groups can help.  If you feel you need help with changing your drinking habits, talk with your health care provider, a good friend, or a therapist, or go to an [place] group. This information is  not intended to replace advice given to you by your health care provider. Make sure you discuss any questions you have with your health care provider. Document Revised: 10/18/2018 Document Reviewed:  09/06/2018 Elsevier Patient Education  [place].

## 2019-09-04 NOTE — Progress Notes (Signed)
Name: Ernest Sweeney   MRN: [id number]    DOB: 02-Aug-1950   Date:09/04/2019       Progress Note  Subjective  Chief Complaint  Chief Complaint  Patient presents with  . Hyperlipidemia  . Coronary Artery Disease  . Hypertension    HPI  Chest pain: no longer having problems, seen by Dr. Clayborn Bigness and is on medical management only   Leucopenia: we will recheck level today  Depression: seen January 2020 and phq 9 was very high at 35, he has a long history of depression but was feeling more down and hopeless, we started him on Zofot 50 mgbut he  stopped Zofot because he read the side effects, he states he is okay at this time, phq 9 is stable at 5  Alcohol use disorder: he always drank, used to beer when he was younger, but since his mid 20's he started to drink liquor, since his 31's he has been drinking heavier, mostly on weekends and worse since COVID-19, drinking a pint and a half of liquor on weekends and some beer, he feels bad about it and would like to quit, worries about his health and had to stop Zoloft because of alcohol use, we will give him information about diet, he is trying to cut down, only drinking beer during the week at most one but still splurges on weekends.   HTN:DBP was elevated two visits in a row so we adjusted dose of lisinopril from 20 to 40 mg but bp dropped and he felt dizzines, so he is down to 20 mg again, likely going up because of alcohol use, we will try 20 mg in am and 10 mg at night, bp was at goal during visit with Dr. Clayborn Bigness in January but DBP elevated again today, he drinks more on the weekends.   Chronic bronchitis/Emphysema : he is still coughing but down to a few times a week , cannot afford Breo, he stopped medication on his own, quit smoking in 2016and is gradually getting better.  No sob or wheezing.   Hyperlipidemia/Atherosclerosis of Aorta: taking aspirin, atorvastatin and denies myalgia.LDL below 100 but would like to have it below 70, he  agrees on adjusting medication   Insomnia: taking trazodone prn only, he states sometimes wakes up early in am and will resume medication  History of prostate cancer: last PSA was normal seeing Dr. Baruch Gouty every 6 months now.He has some urinary incontinence and also nocturia since treatment    Patient Active Problem List   Diagnosis Date Noted  . Lung nodules 09/04/2018  . Chronic bronchitis ([place]) 04/23/2017  . Atherosclerosis of aorta ([place]) 04/23/2017  . Coronary artery disease due to calcified coronary lesion 04/23/2017  . Glaucoma, left eye 08/04/2016  . Prostate cancer ([place]) 07/15/2016  . History of prostatectomy 07/15/2016  . Vitamin D deficiency 05/31/2015  . Hyperlipidemia LDL goal <100 05/31/2015  . Seasonal allergic rhinitis 05/31/2015  . Chronic pain of both shoulders 05/31/2015  . Family history of malignant neoplasm of prostate 07/26/2012  . Hematuria, microscopic 07/26/2012    Past Surgical History:  Procedure Laterality Date  . COLONOSCOPY  2010   Dr Sula Rumple  . excision skin cyst  06-19-14   Dr. Festus Aloe CYST  . PELVIC LYMPH NODE DISSECTION N/A 07/15/2016   Procedure: PELVIC LYMPH NODE DISSECTION;  Surgeon: Nickie Retort, MD;  Location: [facility] ORS;  Service: Urology;  Laterality: N/A;  . ROBOT ASSISTED LAPAROSCOPIC RADICAL PROSTATECTOMY N/A 07/15/2016   Procedure: ROBOTIC  ASSISTED LAPAROSCOPIC RADICAL PROSTATECTOMY;  Surgeon: Nickie Retort, MD;  Location: [facility] ORS;  Service: Urology;  Laterality: N/A;    Family History  Problem Relation Age of Onset  . Heart disease Mother   . Heart attack Mother   . Diabetes Father   . Heart attack Father   . Dementia Father   . Cancer Sister        Breast  . Cancer Brother        Prostate  . Healthy Sister   . Stroke Brother     Social History   Tobacco Use  . Smoking status: Former Smoker    Packs/day: 0.50    Years: 40.00    Pack years: 20.00    Types: Cigarettes    Quit date: 03/23/2016     Years since quitting: 3.4  . Smokeless tobacco: Never Used  . Tobacco comment: to stay quit   Substance Use Topics  . Alcohol use: Yes    Alcohol/week: 8.0 standard drinks    Types: 8 Standard drinks or equivalent per week  . Drug use: No     Current Outpatient Medications:  .  aspirin 81 MG tablet, Take 81 mg by mouth daily., Disp: , Rfl:  .  atorvastatin (LIPITOR) 40 MG tablet, Take 1 tablet (40 mg total) by mouth daily. New cholesterol medication, Disp: 90 tablet, Rfl: 1 .  brimonidine-timolol (COMBIGAN) 0.2-0.5 % ophthalmic solution, Place 1 drop into both eyes every 12 (twelve) hours., Disp: , Rfl:  .  Cholecalciferol (VITAMIN D) 2000 units CAPS, Take 1 capsule (2,000 Units total) by mouth daily., Disp: 30 capsule, Rfl: 0 .  fluticasone (FLONASE) 50 MCG/ACT nasal spray, Place 2 sprays into both nostrils daily., Disp: 16 g, Rfl: 6 .  latanoprost (XALATAN) 0.005 % ophthalmic solution, Place 1 drop into both eyes at bedtime. , Disp: , Rfl:  .  lisinopril (ZESTRIL) 10 MG tablet, Take 1-2 tablets (10-20 mg total) by mouth 2 (two) times daily. 20 mg in am and one in pm, Disp: 270 tablet, Rfl: 0 .  sildenafil (VIAGRA) 100 MG tablet, Take 1 tablet (100 mg total) by mouth daily as needed for erectile dysfunction., Disp: 30 tablet, Rfl: 0 .  traZODone (DESYREL) 100 MG tablet, Take 1 tablet (100 mg total) by mouth at bedtime., Disp: 90 tablet, Rfl: 0 .  triamcinolone cream (KENALOG) 0.1 %, Apply 1 application topically 2 (two) times daily., Disp: 45 g, Rfl: 0  No Known Allergies  I personally reviewed active problem list, medication list, allergies, family history, social history with the patient/caregiver today.   ROS  Constitutional: Negative for fever or weight change.  Respiratory: Negative for cough and shortness of breath.   Cardiovascular: Negative for chest pain or palpitations.  Gastrointestinal: Negative for abdominal pain, no bowel changes.  Musculoskeletal: Negative for gait  problem or joint swelling.  Skin: Negative for rash.  Neurological: Negative for dizziness or headache.  No other specific complaints in a complete review of systems (except as listed in HPI above).  Objective  Vitals:   09/04/19 1004  BP: (!) 118/98  Pulse: 100  Resp: 16  Temp: (!) 97.3 F (36.3 C)  TempSrc: Temporal  SpO2: 98%  Weight: 186 lb 4.8 oz (84.5 kg)  Height: 5\' 9"  (1.753 m)    Body mass index is 27.51 kg/m.  Physical Exam  Constitutional: Patient appears well-developed and well-nourished.  No distress.  HEENT: head atraumatic, normocephalic, pupils equal and reactive to light, Cardiovascular:  Normal rate, regular rhythm and normal heart sounds.  No murmur heard. No BLE edema. Pulmonary/Chest: Effort normal and breath sounds normal. No respiratory distress. Abdominal: Soft.  There is no tenderness. Psychiatric: Patient has a normal mood and affect. behavior is normal. Judgment and thought content normal.  Recent Results (from the past 2160 hour(s))  CBC with Differential/Platelet     Status: Abnormal   Collection Time: 06/15/19 12:00 AM  Result Value Ref Range   WBC 2.4 (L) 3.8 - 10.8 Thousand/uL   RBC 4.50 4.20 - 5.80 Million/uL   Hemoglobin 14.4 13.2 - 17.1 g/dL   HCT 43.3 38.5 - 50.0 %   MCV 96.2 80.0 - 100.0 fL   MCH 32.0 27.0 - 33.0 pg   MCHC 33.3 32.0 - 36.0 g/dL   RDW 12.2 11.0 - 15.0 %   Platelets 340 140 - 400 Thousand/uL   MPV 9.1 7.5 - 12.5 fL   Neutro Abs 1,339 (L) 1,500 - 7,800 cells/uL   Lymphs Abs 684 (L) 850 - 3,900 cells/uL   Absolute Monocytes 298 200 - 950 cells/uL   Eosinophils Absolute 50 15 - 500 cells/uL   Basophils Absolute 29 0 - 200 cells/uL   Neutrophils Relative % 55.8 %   Total Lymphocyte 28.5 %   Monocytes Relative 12.4 %   Eosinophils Relative 2.1 %   Basophils Relative 1.2 %  COMPLETE METABOLIC PANEL WITH GFR     Status: Abnormal   Collection Time: 06/15/19 12:00 AM  Result Value Ref Range   Glucose, Bld 86 65 - 99  mg/dL    Comment: .            Fasting reference interval .    BUN 12 7 - 25 mg/dL   Creat 1.30 (H) 0.70 - 1.25 mg/dL    Comment: For patients >2 years of age, the reference limit for Creatinine is approximately 13% higher for people identified as African-American. .    GFR, Est Non African American 56 (L) > OR = 60 mL/min/1.48m2   GFR, Est African American 65 > OR = 60 mL/min/1.42m2   BUN/Creatinine Ratio 9 6 - 22 (calc)   Sodium 139 135 - 146 mmol/L   Potassium 4.9 3.5 - 5.3 mmol/L   Chloride 103 98 - 110 mmol/L   CO2 29 20 - 32 mmol/L   Calcium 10.0 8.6 - 10.3 mg/dL   Total Protein 6.9 6.1 - 8.1 g/dL   Albumin 4.4 3.6 - 5.1 g/dL   Globulin 2.5 1.9 - 3.7 g/dL (calc)   AG Ratio 1.8 1.0 - 2.5 (calc)   Total Bilirubin 1.1 0.2 - 1.2 mg/dL   Alkaline phosphatase (APISO) 61 35 - 144 U/L   AST 13 10 - 35 U/L   ALT 11 9 - 46 U/L  Lipid panel     Status: None   Collection Time: 06/15/19 12:00 AM  Result Value Ref Range   Cholesterol 162 <200 mg/dL   HDL 68 > OR = 40 mg/dL   Triglycerides 55 <150 mg/dL   LDL Cholesterol (Calc) 80 mg/dL (calc)    Comment: Reference range: <100 . Desirable range <100 mg/dL for primary prevention;   <70 mg/dL for patients with CHD or diabetic patients  with > or = 2 CHD risk factors. Marland Kitchen LDL-C is now calculated using the Martin-Hopkins  calculation, which is a validated novel method providing  better accuracy than the Friedewald equation in the  estimation of LDL-C.  Cresenciano Genre et al.  JAMA. MU:[id number]): [phone]  (http://education.[facility].com/faq/FAQ164)    Total CHOL/HDL Ratio 2.4 <5.0 (calc)   Non-HDL Cholesterol (Calc) 94 <130 mg/dL (calc)    Comment: For patients with diabetes plus 1 major ASCVD risk  factor, treating to a non-HDL-C goal of <100 mg/dL  (LDL-C of <70 mg/dL) is considered a therapeutic  option.   Hemoglobin A1c     Status: None   Collection Time: 06/15/19 12:00 AM  Result Value Ref Range   Hgb A1c MFr Bld 5.3  <5.7 % of total Hgb    Comment: For the purpose of screening for the presence of diabetes: . <5.7%       Consistent with the absence of diabetes 5.7-6.4%    Consistent with increased risk for diabetes             (prediabetes) > or =6.5%  Consistent with diabetes . This assay result is consistent with a decreased risk of diabetes. . Currently, no consensus exists regarding use of hemoglobin A1c for diagnosis of diabetes in children. . According to American Diabetes Association (ADA) guidelines, hemoglobin A1c <7.0% represents optimal control in non-pregnant diabetic patients. Different metrics may apply to specific patient populations.  Standards of Medical Care in Diabetes(ADA). .    Mean Plasma Glucose 105 (calc)   eAG (mmol/L) 5.8 (calc)  CRP High sensitivity     Status: None   Collection Time: 06/15/19 12:00 AM  Result Value Ref Range   hs-CRP <0.3 mg/L    Comment: Reference Range Optimal <1.0 Jellinger PS et al. [facility] [place] Pract.2017;23(Suppl 2):1-87. . For ages >96 Years: hs-CRP mg/L  Risk According to AHA/CDC Guidelines <1.0         Lower relative cardiovascular risk. 1.0-3.0      Average relative cardiovascular risk. 3.1-10.0     Higher relative cardiovascular risk.              Consider retesting in 1 to 2 weeks to              exclude a benign transient elevation              in the baseline CRP value secondary              to infection or inflammation. >10.0        Persistent elevation, upon retesting,              may be associated with infection and              inflammation. .   Parathyroid hormone, intact (no Ca)     Status: None   Collection Time: 06/15/19 12:00 AM  Result Value Ref Range   PTH 27 14 - 64 pg/mL    Comment: . Interpretive Guide    Intact PTH           Calcium ------------------    ----------           ------- Normal Parathyroid    Normal               Normal Hypoparathyroidism    Low or Low Normal    Low Hyperparathyroidism     Primary            Normal or High       High    Secondary          High                 Normal or Low  Tertiary           High                 High Non-Parathyroid    Hypercalcemia      Low or Low Normal    High .   CBC with Differential/Platelet     Status: Abnormal   Collection Time: 07/28/19 11:40 AM  Result Value Ref Range   WBC 2.5 (L) 3.8 - 10.8 Thousand/uL   RBC 4.56 4.20 - 5.80 Million/uL   Hemoglobin 14.8 13.2 - 17.1 g/dL   HCT 43.8 38.5 - 50.0 %   MCV 96.1 80.0 - 100.0 fL   MCH 32.5 27.0 - 33.0 pg   MCHC 33.8 32.0 - 36.0 g/dL   RDW 12.1 11.0 - 15.0 %   Platelets 320 140 - 400 Thousand/uL   MPV 9.1 7.5 - 12.5 fL   Neutro Abs 1,255 (L) 1,500 - 7,800 cells/uL   Lymphs Abs 830 (L) 850 - 3,900 cells/uL   Absolute Monocytes 355 200 - 950 cells/uL   Eosinophils Absolute 50 15 - 500 cells/uL   Basophils Absolute 10 0 - 200 cells/uL   Neutrophils Relative % 50.2 %   Total Lymphocyte 33.2 %   Monocytes Relative 14.2 %   Eosinophils Relative 2.0 %   Basophils Relative 0.4 %      PHQ2/9: Depression screen [facility] 2/9 06/02/2019 03/01/2019 02/21/2019 11/28/2018 08/29/2018  Decreased Interest 1 0 1 1 2   Down, Depressed, Hopeless 1 0 1 1 1   PHQ - 2 Score 2 0 2 2 3   Altered sleeping 0 0 0 1 0  Tired, decreased energy 0 0 0 0 0  Change in appetite 1 0 0 0 1  Feeling bad or failure about yourself  1 0 0 1 1  Trouble concentrating 1 0 0 0 0  Moving slowly or fidgety/restless 0 0 1 0 0  Suicidal thoughts 0 0 0 0 0  PHQ-9 Score 5 0 3 4 5   Difficult doing work/chores Somewhat difficult - Not difficult at all Somewhat difficult Not difficult at all  Some recent data might be hidden    phq 9 is positive   Fall Risk: Fall Risk  06/02/2019 03/01/2019 02/21/2019 11/28/2018 08/29/2018  Falls in the past year? 0 0 0 0 0  Number falls in past yr: 0 0 0 0 0  Injury with Fall? 0 0 0 0 0  Risk for fall due to : - - - - -  Risk for fall due to: Comment - - - - -  Follow up - - Falls  prevention discussed - -       Assessment & Plan  1. Other neutropenia ([place])  - CBC with Differential/Platelet  2. Essential hypertension  - lisinopril (ZESTRIL) 10 MG tablet; Take 1-2 tablets (10-20 mg total) by mouth 2 (two) times daily. 20 mg in am and one in pm  Dispense: 270 tablet; Refill: 0  3. Atherosclerosis of aorta ([facility])  - rosuvastatin (CRESTOR) 40 MG tablet; Take 1 tablet (40 mg total) by mouth daily. In place of Atorvastatin  Dispense: 90 tablet; Refill: 1  4. Vitamin D deficiency   5. Centrilobular emphysema ([place])   6. Mild major depression ([facility])  stable  7. History of prostatectomy   8. Alcoholism /alcohol abuse ([place])  Trying to cut down

## 2019-09-07 ENCOUNTER — Other Ambulatory Visit: Payer: Medicare HMO

## 2019-09-07 ENCOUNTER — Other Ambulatory Visit: Payer: Self-pay

## 2019-09-07 DIAGNOSIS — C61 Malignant neoplasm of prostate: Secondary | ICD-10-CM | POA: Diagnosis not present

## 2019-09-08 ENCOUNTER — Ambulatory Visit: Payer: Medicare HMO

## 2019-09-08 LAB — PSA: Prostate Specific Ag, Serum: <0.1 ng/mL (ref 0.0–4.0)

## 2019-09-11 ENCOUNTER — Ambulatory Visit: Payer: Medicare HMO | Admitting: Urology

## 2019-09-13 ENCOUNTER — Encounter: Payer: Self-pay | Admitting: Urology

## 2019-09-13 ENCOUNTER — Ambulatory Visit: Payer: Medicare HMO | Admitting: Urology

## 2019-09-13 ENCOUNTER — Other Ambulatory Visit: Payer: Self-pay

## 2019-09-13 VITALS — BP 105/70 | HR 93 | Ht 69.0 in | Wt 186.0 lb

## 2019-09-13 DIAGNOSIS — C61 Malignant neoplasm of prostate: Secondary | ICD-10-CM | POA: Diagnosis not present

## 2019-09-13 NOTE — Patient Instructions (Signed)

## 2019-09-13 NOTE — Progress Notes (Signed)
   09/13/2019 4:29 PM   [name] [birth year] [id number]  Reason for visit: Follow up prostate cancer  HPI: I saw Mr. Knierim in urology clinic today for prostate cancer follow-up. To briefly summarize, he is a healthy 69 year old African-American male that underwent a robotic prostatectomy and bilateral lymphadenectomy in January 2018 with Dr. Pilar Jarvis. Final pathology showed Gleason score 3+4 = 7 prostate cancer stage [id number] Mx, therewasfocal extraprostatic extension and perineural invasion, margins were negative. Pretreatment PSA was 6, and PSA after surgery was undetectable.  He developed biochemical recurrence in August 2019 with a PSA of 0.2x2,and underwent salvage radiation to the prostatic fossa and 6 months of ADT(03/08/2018).  His most recent PSA 09/06/2018 is undetectable.  Overall he continues to do well.  His energy has returned now that he is off ADT.    Minimally bothersome stress urinary incontinence.  He will also have some urgency when he drinks a lot of diet drinks or sodas.  He is able to get erections with 100 mg sildenafil on demand  We had a long conversation again about biochemical recurrence after prostatectomy, and the need for close PSA surveillance after salvage radiation. We also reviewed the plan of monitoring the PSA, with plan for resuming ADT in the future if the PSA were to increase.  RTC 1 year for PSA, he is seeing Dr. Donella Stade in 6 months with a PSA prior  I spent 20 total minutes on the day of the encounter including pre-visit review of the medical record, face-to-face time with the patient, and post visit ordering of labs/imaging/tests.  Billey Co, [facility] [address], [place], [place] [address] [phone]

## 2019-09-20 ENCOUNTER — Ambulatory Visit: Payer: Self-pay | Admitting: Urology

## 2019-09-20 DIAGNOSIS — Z23 Encounter for immunization: Secondary | ICD-10-CM | POA: Diagnosis not present

## 2019-09-21 ENCOUNTER — Ambulatory Visit
Admission: RE | Admit: 2019-09-21 | Discharge: 2019-09-21 | Disposition: A | Discharge status: Home / Self Care | Payer: Medicare HMO | Source: Ambulatory Visit | Attending: Nurse Practitioner | Admitting: Nurse Practitioner

## 2019-09-21 ENCOUNTER — Other Ambulatory Visit: Payer: Self-pay

## 2019-09-21 DIAGNOSIS — Z87891 Personal history of nicotine dependence: Secondary | ICD-10-CM | POA: Diagnosis not present

## 2019-09-22 ENCOUNTER — Other Ambulatory Visit: Payer: Self-pay | Admitting: Family Medicine

## 2019-09-22 DIAGNOSIS — R21 Rash and other nonspecific skin eruption: Secondary | ICD-10-CM

## 2019-09-22 NOTE — Telephone Encounter (Signed)
Requested medication (s) are due for refill today: Yes  Requested medication (s) are on the active medication list: Yes  Last refill:  02/24/17  Future visit scheduled: Yes  Notes to clinic:  Prescription has expired.    Requested Prescriptions  Pending Prescriptions Disp Refills   triamcinolone cream (KENALOG) 0.1 % [Pharmacy Med Name: Triamcinolone Acetonide 0.1 % External Cream] 45 g 0    Sig: APPLY  CREAM EXTERNALLY TWICE DAILY      Dermatology:  Corticosteroids Passed - 09/22/2019  3:35 PM      Passed - Valid encounter within last 12 months    Recent Outpatient Visits           2 weeks ago Other neutropenia [facility])   [facility] [name], MD   3 months ago Centrilobular emphysema [facility])   [facility] [name], MD   6 months ago Centrilobular emphysema [facility])   [facility] [name], MD   9 months ago Essential hypertension   [facility] [place], Drue Stager, MD   1 year ago Moderate episode of recurrent major depressive disorder [facility])   [facility] [name], MD       Future Appointments             In 3 months Ancil Boozer, Drue Stager, MD [facility] [place]   In 5 months  [place]

## 2019-09-25 ENCOUNTER — Encounter: Payer: Self-pay | Admitting: *Deleted

## 2019-10-20 ENCOUNTER — Other Ambulatory Visit: Payer: Self-pay

## 2019-10-20 ENCOUNTER — Ambulatory Visit (INDEPENDENT_AMBULATORY_CARE_PROVIDER_SITE_OTHER): Payer: Medicare HMO | Admitting: Family Medicine

## 2019-10-20 ENCOUNTER — Encounter: Payer: Self-pay | Admitting: Family Medicine

## 2019-10-20 VITALS — BP 118/80 | HR 100 | Temp 97.5°F | Resp 18 | Ht 69.0 in | Wt 183.2 lb

## 2019-10-20 DIAGNOSIS — Z1211 Encounter for screening for malignant neoplasm of colon: Secondary | ICD-10-CM | POA: Diagnosis not present

## 2019-10-20 DIAGNOSIS — R195 Other fecal abnormalities: Secondary | ICD-10-CM | POA: Diagnosis not present

## 2019-10-20 DIAGNOSIS — Z86010 Personal history of colonic polyps: Secondary | ICD-10-CM

## 2019-10-20 NOTE — Progress Notes (Signed)
Name: Ernest Sweeney   MRN: [id number]    DOB: 08/22/1950   Date:10/20/2019       Progress Note  Subjective  Chief Complaint  Chief Complaint  Patient presents with  . Diarrhea    bowel changes on and off for 8 monthe. Patient thought it was related to radiation treatments    HPI  Change color or stools:  he states symptoms started around over 8 months ago. He states he noticed episodes of change in color of stools, sometimes dark, he also had one episode of blood on toilet paper. He states his bowel movements are [place] 2 and about once a day. He states he called because he has noticed a change in color of his stools. Darker than usual but not black. No abdominal cramping or weight loss. His diet is rich in fast food . He had prostate cancer and had prostatectomy and radiation - finished radiation in 2019. He has a history of hemorrhoids - many years ago. He is due for a colonoscopy and we will place the referral.  Same weight as Feb 2020   Patient Active Problem List   Diagnosis Date Noted  . Lung nodules 09/04/2018  . Chronic bronchitis ([place]) 04/23/2017  . Atherosclerosis of aorta ([place]) 04/23/2017  . Coronary artery disease due to calcified coronary lesion 04/23/2017  . Glaucoma, left eye 08/04/2016  . Prostate cancer ([place]) 07/15/2016  . History of prostatectomy 07/15/2016  . Vitamin D deficiency 05/31/2015  . Hyperlipidemia LDL goal <100 05/31/2015  . Seasonal allergic rhinitis 05/31/2015  . Chronic pain of both shoulders 05/31/2015  . Family history of malignant neoplasm of prostate 07/26/2012  . Hematuria, microscopic 07/26/2012    Past Surgical History:  Procedure Laterality Date  . COLONOSCOPY  2010   Dr Sula Rumple  . excision skin cyst  06-19-14   Dr. Festus Aloe CYST  . PELVIC LYMPH NODE DISSECTION N/A 07/15/2016   Procedure: PELVIC LYMPH NODE DISSECTION;  Surgeon: Nickie Retort, MD;  Location: [facility] ORS;  Service: Urology;  Laterality: N/A;  . ROBOT ASSISTED  LAPAROSCOPIC RADICAL PROSTATECTOMY N/A 07/15/2016   Procedure: ROBOTIC ASSISTED LAPAROSCOPIC RADICAL PROSTATECTOMY;  Surgeon: Nickie Retort, MD;  Location: [facility] ORS;  Service: Urology;  Laterality: N/A;    Family History  Problem Relation Age of Onset  . Heart disease Mother   . Heart attack Mother   . Diabetes Father   . Heart attack Father   . Dementia Father   . Cancer Sister        Breast  . Cancer Brother        Prostate  . Healthy Sister   . Stroke Brother     Social History   Tobacco Use  . Smoking status: Former Smoker    Packs/day: 0.50    Years: 40.00    Pack years: 20.00    Types: Cigarettes    Quit date: 03/23/2016    Years since quitting: 3.5  . Smokeless tobacco: Never Used  . Tobacco comment: to stay quit   Substance Use Topics  . Alcohol use: Yes    Alcohol/week: 8.0 standard drinks    Types: 8 Standard drinks or equivalent per week     Current Outpatient Medications:  .  aspirin 81 MG tablet, Take 81 mg by mouth daily., Disp: , Rfl:  .  brimonidine-timolol (COMBIGAN) 0.2-0.5 % ophthalmic solution, Place 1 drop into both eyes every 12 (twelve) hours., Disp: , Rfl:  .  Cholecalciferol (VITAMIN D)  2000 units CAPS, Take 1 capsule (2,000 Units total) by mouth daily., Disp: 30 capsule, Rfl: 0 .  fluticasone (FLONASE) 50 MCG/ACT nasal spray, Place 2 sprays into both nostrils daily., Disp: 16 g, Rfl: 6 .  latanoprost (XALATAN) 0.005 % ophthalmic solution, Place 1 drop into both eyes at bedtime. , Disp: , Rfl:  .  lisinopril (ZESTRIL) 10 MG tablet, Take 1-2 tablets (10-20 mg total) by mouth 2 (two) times daily. 20 mg in am and one in pm, Disp: 270 tablet, Rfl: 0 .  rosuvastatin (CRESTOR) 40 MG tablet, Take 1 tablet (40 mg total) by mouth daily. In place of Atorvastatin, Disp: 90 tablet, Rfl: 1 .  sildenafil (VIAGRA) 100 MG tablet, Take 1 tablet (100 mg total) by mouth daily as needed for erectile dysfunction., Disp: 30 tablet, Rfl: 0 .  traZODone (DESYREL) 100  MG tablet, Take 1 tablet (100 mg total) by mouth at bedtime., Disp: 90 tablet, Rfl: 0 .  triamcinolone cream (KENALOG) 0.1 %, APPLY  CREAM EXTERNALLY TWICE DAILY, Disp: 45 g, Rfl: 0  No Known Allergies  I personally reviewed active problem list, medication list, allergies, family history, social history with the patient/caregiver today.   ROS  Ten systems reviewed and is negative except as mentioned in HPI   Objective  Vitals:   10/20/19 1317  BP: 118/80  Pulse: 100  Resp: 18  Temp: (!) 97.5 F (36.4 C)  TempSrc: Temporal  SpO2: 96%  Weight: 183 lb 3.2 oz (83.1 kg)    Body mass index is 27.05 kg/m.  Physical Exam  Constitutional: Patient appears well-developed and well-nourished. Obese  No distress.  HEENT: head atraumatic, normocephalic, pupils equal and reactive to light Cardiovascular: Normal rate, regular rhythm and normal heart sounds.  No murmur heard. No BLE edema. Pulmonary/Chest: Effort normal and breath sounds normal. No respiratory distress. Abdominal: Soft.  There is no tenderness. Normal bowel sounds  Psychiatric: Patient has a normal mood and affect. behavior is normal. Judgment and thought content normal  Recent Results (from the past 2160 hour(s))  CBC with Differential/Platelet     Status: Abnormal   Collection Time: 07/28/19 11:40 AM  Result Value Ref Range   WBC 2.5 (L) 3.8 - 10.8 Thousand/uL   RBC 4.56 4.20 - 5.80 Million/uL   Hemoglobin 14.8 13.2 - 17.1 g/dL   HCT 43.8 38.5 - 50.0 %   MCV 96.1 80.0 - 100.0 fL   MCH 32.5 27.0 - 33.0 pg   MCHC 33.8 32.0 - 36.0 g/dL   RDW 12.1 11.0 - 15.0 %   Platelets 320 140 - 400 Thousand/uL   MPV 9.1 7.5 - 12.5 fL   Neutro Abs 1,255 (L) 1,500 - 7,800 cells/uL   Lymphs Abs 830 (L) 850 - 3,900 cells/uL   Absolute Monocytes 355 200 - 950 cells/uL   Eosinophils Absolute 50 15 - 500 cells/uL   Basophils Absolute 10 0 - 200 cells/uL   Neutrophils Relative % 50.2 %   Total Lymphocyte 33.2 %   Monocytes  Relative 14.2 %   Eosinophils Relative 2.0 %   Basophils Relative 0.4 %  CBC with Differential/Platelet     Status: Abnormal   Collection Time: 09/04/19 11:08 AM  Result Value Ref Range   WBC 2.8 (L) 3.8 - 10.8 Thousand/uL   RBC 4.74 4.20 - 5.80 Million/uL   Hemoglobin 15.5 13.2 - 17.1 g/dL   HCT 45.7 38.5 - 50.0 %   MCV 96.4 80.0 - 100.0 fL  MCH 32.7 27.0 - 33.0 pg   MCHC 33.9 32.0 - 36.0 g/dL   RDW 12.4 11.0 - 15.0 %   Platelets 326 140 - 400 Thousand/uL   MPV 9.4 7.5 - 12.5 fL   Neutro Abs 1,504 1,500 - 7,800 cells/uL   Lymphs Abs 812 (L) 850 - 3,900 cells/uL   Absolute Monocytes 364 200 - 950 cells/uL   Eosinophils Absolute 101 15 - 500 cells/uL   Basophils Absolute 20 0 - 200 cells/uL   Neutrophils Relative % 53.7 %   Total Lymphocyte 29.0 %   Monocytes Relative 13.0 %   Eosinophils Relative 3.6 %   Basophils Relative 0.7 %  PSA     Status: None   Collection Time: 09/07/19 10:46 AM  Result Value Ref Range   Prostate Specific Ag, Serum <0.1 0.0 - 4.0 ng/mL    Comment: Roche ECLIA methodology. According to the American Urological Association, Serum PSA should decrease and remain at undetectable levels after radical prostatectomy. The AUA defines biochemical recurrence as an initial PSA value 0.2 ng/mL or greater followed by a subsequent confirmatory PSA value 0.2 ng/mL or greater. Values obtained with different assay methods or kits cannot be used interchangeably. Results cannot be interpreted as absolute evidence of the presence or absence of malignant disease.      PHQ2/9: Depression screen [facility] 2/9 10/20/2019 06/02/2019 03/01/2019 02/21/2019 11/28/2018  Decreased Interest 0 1 0 1 1  Down, Depressed, Hopeless 0 1 0 1 1  PHQ - 2 Score 0 2 0 2 2  Altered sleeping 0 0 0 0 1  Tired, decreased energy 0 0 0 0 0  Change in appetite 0 1 0 0 0  Feeling bad or failure about yourself  0 1 0 0 1  Trouble concentrating 0 1 0 0 0  Moving slowly or fidgety/restless 0 0 0 1 0   Suicidal thoughts 0 0 0 0 0  PHQ-9 Score 0 5 0 3 4  Difficult doing work/chores Not difficult at all Somewhat difficult - Not difficult at all Somewhat difficult  Some recent data might be hidden    phq 9 is negative   Fall Risk: Fall Risk  10/20/2019 06/02/2019 03/01/2019 02/21/2019 11/28/2018  Falls in the past year? 0 0 0 0 0  Number falls in past yr: 0 0 0 0 0  Injury with Fall? 0 0 0 0 0  Risk for fall due to : - - - - -  Risk for fall due to: Comment - - - - -  Follow up Falls evaluation completed - - Falls prevention discussed -      Functional Status Survey: Is the patient deaf or have difficulty hearing?: No Does the patient have difficulty seeing, even when wearing glasses/contacts?: No Does the patient have difficulty concentrating, remembering, or making decisions?: No Does the patient have difficulty walking or climbing stairs?: No Does the patient have difficulty dressing or bathing?: No Does the patient have difficulty doing errands alone such as visiting a doctor's office or shopping?: No    Assessment & Plan  1. History of colonic polyps  - Ambulatory referral to Gastroenterology used to see Dr. Jamal Collin   2. Colon cancer screening  - Ambulatory referral to Gastroenterology  3. Abnormal stool color  He is worried about stool color change, but stool is not black or red, just darker than usual and sometimes green, explained that it may be secondary to his diet, but since due for colonoscopy  we will refer him to GI - Ambulatory referral to Gastroenterology

## 2019-12-14 ENCOUNTER — Other Ambulatory Visit: Payer: Self-pay

## 2019-12-14 ENCOUNTER — Ambulatory Visit: Payer: Medicare HMO | Admitting: Gastroenterology

## 2019-12-14 VITALS — BP 121/74 | HR 82 | Temp 97.7°F | Ht 69.0 in | Wt 185.0 lb

## 2019-12-14 DIAGNOSIS — Z860100 Personal history of colon polyps, unspecified: Secondary | ICD-10-CM

## 2019-12-14 DIAGNOSIS — R194 Change in bowel habit: Secondary | ICD-10-CM

## 2019-12-14 NOTE — Progress Notes (Signed)
[name] MD, MRCP(U.K) [address]  [place]  [place],  [address]  Main: [phone]  Fax: [phone]   Gastroenterology Consultation  Referring Provider:     [name], MD Primary Care Physician:  [name], MD Primary Gastroenterologist:  Dr. [name]  Reason for Consultation:     Change in color stool and colon cancer screening        HPI:   Ernest Sweeney is a 69 y.o. y/o male referred for consultation & management  by Dr. Ancil Boozer, Drue Stager, MD.    Since that a few weeks back he had an episode of diarrhea took some Pepto-Bismol and then his stools turned dark in color and subsequently resolved.  He has since stopped taking the Pepto-Bismol and all the symptoms have resolved completely.  No issues presently.  He says his last colonoscopy was 5 to 6 years back and he had polyps.  No family history of colon cancer or polyps.  No rectal bleeding. 09/04/2019: Hemoglobin 15.5 g  Past Medical History:  Diagnosis Date  . Anxiety    Panic attacks in the past  . Arthritis   . Benign prostatic hypertrophy without lower urinary tract symptoms   . Cancer [facility])    Prostate  . Colon polyp   . GERD (gastroesophageal reflux disease)   . Glaucoma (increased eye pressure)    Left eye only  . Hyperlipidemia   . Hypertension     Past Surgical History:  Procedure Laterality Date  . COLONOSCOPY  2010   Dr Sula Rumple  . excision skin cyst  06-19-14   Dr. Festus Aloe CYST  . PELVIC LYMPH NODE DISSECTION N/A 07/15/2016   Procedure: PELVIC LYMPH NODE DISSECTION;  Surgeon: Nickie Retort, MD;  Location: [facility] ORS;  Service: Urology;  Laterality: N/A;  . ROBOT ASSISTED LAPAROSCOPIC RADICAL PROSTATECTOMY N/A 07/15/2016   Procedure: ROBOTIC ASSISTED LAPAROSCOPIC RADICAL PROSTATECTOMY;  Surgeon: Nickie Retort, MD;  Location: [facility] ORS;  Service: Urology;  Laterality: N/A;    Prior to Admission medications   Medication Sig Start Date End Date Taking?  Authorizing Provider  aspirin 81 MG tablet Take 81 mg by mouth daily.    [provider]  brimonidine-timolol (COMBIGAN) 0.2-0.5 % ophthalmic solution Place 1 drop into both eyes every 12 (twelve) hours.    [provider]  Cholecalciferol (VITAMIN D) 2000 units CAPS Take 1 capsule (2,000 Units total) by mouth daily. 10/03/15   [name], MD  fluticasone (FLONASE) 50 MCG/ACT nasal spray Place 2 sprays into both nostrils daily. 06/02/19   [name], MD  latanoprost (XALATAN) 0.005 % ophthalmic solution Place 1 drop into both eyes at bedtime.     Birder Robson, MD  lisinopril (ZESTRIL) 10 MG tablet Take 1-2 tablets (10-20 mg total) by mouth 2 (two) times daily. 20 mg in am and one in pm 09/04/19   [name], MD  rosuvastatin (CRESTOR) 40 MG tablet Take 1 tablet (40 mg total) by mouth daily. In place of Atorvastatin 09/04/19   [name], MD  sildenafil (VIAGRA) 100 MG tablet Take 1 tablet (100 mg total) by mouth daily as needed for erectile dysfunction. 06/02/19   [name], MD  traZODone (DESYREL) 100 MG tablet Take 1 tablet (100 mg total) by mouth at bedtime. 03/01/19   [name], MD  triamcinolone cream (KENALOG) 0.1 % APPLY  CREAM EXTERNALLY TWICE DAILY 09/23/19   [name], MD    Family History  Problem Relation Age of  Onset  . Heart disease Mother   . Heart attack Mother   . Diabetes Father   . Heart attack Father   . Dementia Father   . Cancer Sister        Breast  . Cancer Brother        Prostate  . Healthy Sister   . Stroke Brother      Social History   Tobacco Use  . Smoking status: Former Smoker    Packs/day: 0.50    Years: 40.00    Pack years: 20.00    Types: Cigarettes    Quit date: 03/23/2016    Years since quitting: 3.7  . Smokeless tobacco: Never Used  . Tobacco comment: to stay quit   Substance Use Topics  . Alcohol use: Yes    Alcohol/week: 8.0 standard drinks    Types: 8 Standard drinks or equivalent  per week  . Drug use: No    Allergies as of 12/14/2019  . (No Known Allergies)    Review of Systems:    All systems reviewed and negative except where noted in HPI.   Physical Exam:  There were no vitals taken for this visit. No LMP for male patient. Psych:  Alert and cooperative. Normal mood and affect. General:   Alert,  Well-developed, well-nourished, pleasant and cooperative in NAD Head:  Normocephalic and atraumatic. Eyes:  Sclera clear, no icterus.   Conjunctiva pink. Ears:  Normal auditory acuity. Lungs:  Respirations even and unlabored.  Clear throughout to auscultation.   No wheezes, crackles, or rhonchi. No acute distress. Heart:  Regular rate and rhythm; no murmurs, clicks, rubs, or gallops. Abdomen:  Normal bowel sounds.  No bruits.  Soft, non-tender and non-distended without masses, hepatosplenomegaly or hernias noted.  No guarding or rebound tenderness.    Neurologic:  Alert and oriented x3;  grossly normal neurologically. Psych:  Alert and cooperative. Normal mood and affect.  Imaging Studies: No results found.  Assessment and Plan:   Ernest Sweeney is a 69 y.o. y/o male has been referred for change in bowel habits that have resolved.  Prior history of colon polyps.  Plan 1.  Colonoscopy   I have discussed alternative options, risks & benefits,  which include, but are not limited to, bleeding, infection, perforation,respiratory complication & drug reaction.  The patient agrees with this plan & written consent will be obtained.     Follow up as needed  Dr [name] MD,MRCP(U.K)

## 2019-12-29 ENCOUNTER — Encounter: Payer: Self-pay | Admitting: Family Medicine

## 2019-12-29 ENCOUNTER — Other Ambulatory Visit: Payer: Self-pay

## 2019-12-29 ENCOUNTER — Ambulatory Visit (INDEPENDENT_AMBULATORY_CARE_PROVIDER_SITE_OTHER): Payer: Medicare HMO | Admitting: Family Medicine

## 2019-12-29 VITALS — BP 104/80 | HR 78 | Temp 97.1°F | Wt 183.9 lb

## 2019-12-29 DIAGNOSIS — I2510 Atherosclerotic heart disease of native coronary artery without angina pectoris: Secondary | ICD-10-CM | POA: Diagnosis not present

## 2019-12-29 DIAGNOSIS — I700 Atherosclerosis of aorta: Secondary | ICD-10-CM | POA: Diagnosis not present

## 2019-12-29 DIAGNOSIS — J432 Centrilobular emphysema: Secondary | ICD-10-CM | POA: Diagnosis not present

## 2019-12-29 DIAGNOSIS — D708 Other neutropenia: Secondary | ICD-10-CM | POA: Diagnosis not present

## 2019-12-29 DIAGNOSIS — IMO0001 Reserved for inherently not codable concepts without codable children: Secondary | ICD-10-CM

## 2019-12-29 DIAGNOSIS — I10 Essential (primary) hypertension: Secondary | ICD-10-CM

## 2019-12-29 DIAGNOSIS — E559 Vitamin D deficiency, unspecified: Secondary | ICD-10-CM | POA: Diagnosis not present

## 2019-12-29 DIAGNOSIS — F1020 Alcohol dependence, uncomplicated: Secondary | ICD-10-CM

## 2019-12-29 DIAGNOSIS — R69 Illness, unspecified: Secondary | ICD-10-CM | POA: Diagnosis not present

## 2019-12-29 MED ORDER — LISINOPRIL 10 MG PO TABS: 10.0000 mg | ORAL_TABLET | Freq: Two times a day (BID) | ORAL | 1 refills | Status: DC

## 2019-12-29 MED ORDER — ROSUVASTATIN CALCIUM 40 MG PO TABS: 40.0000 mg | ORAL_TABLET | Freq: Every day | ORAL | 1 refills | Status: DC

## 2019-12-29 NOTE — Progress Notes (Signed)
Name: Ernest Sweeney   MRN: [id number]    DOB: March 06, 1951   Date:12/29/2019       Progress Note  Subjective  Chief Complaint  Chief Complaint  Patient presents with  . Hyperlipidemia  . Hypertension    HPI  Leucopenia: we will continue to monitor it yearly   Depression: seen January 2020 and phq 9 was very high at 75, he has a long history of depression but was feeling more down and hopeless, we started him on Zofot 50 mgbut he  stopped Zofot because he read the side effects, he has been off medication for a while now and pqh 9 is negative, he states exercising has helped with his symptoms.   Alcohol use disorder: he always drank, used to beer when he was younger, but since his mid 20's he started to drink liquor, since his 70's he has been drinking heavier, mostly on weekends and worse since COVID-19, drinking a pint and a half of liquor on weekends and some beer, he feels bad about it and would like to quit, worries about his health and had to stop Zoloft because of alcohol use. He states he is back down to one pint of liquor on weekends, feeling better emotionally , going for walks, but has not quit drinking yet. Not ready .   HTN:DBP was elevated two visits in a row so we adjusted dose of lisinopril from 20 to 40 mg butbp dropped and he felt dizzines, so he is now on 20 mg in am and 10 mg at night and is doing well, very seldom gets dizzy when gets up fast.   Chronic bronchitis/Emphysema: he used Breo in the past but he stopped medication on his own, quit smoking in 2016and is gradually getting better.  No sob or wheezing.he states cough is at most a couple times a week and is now dry   Hyperlipidemia/Atherosclerosis of Aorta: taking aspirin, atorvastatin and denies myalgia.Last LDL was down to 80 and we will recheck on his next visit. He has atherosclerosis of coronaries but not chest pain ro palpitation, he was seen by Dr. Clayborn Bigness , negative stress test done 07/10/2019    Insomnia:taking trazodone prn only, he states waking up with alarm and going for a walk every day, he has been sleeping well lately   History of prostate cancer: last PSA was normal seeing Dr. Baruch Gouty every 6 months now.He has some urinary incontinence and also nocturia since treatment Unchanged.    Patient Active Problem List   Diagnosis Date Noted  . Lung nodules 09/04/2018  . Chronic bronchitis ([place]) 04/23/2017  . Atherosclerosis of aorta ([place]) 04/23/2017  . Coronary artery disease due to calcified coronary lesion 04/23/2017  . Glaucoma, left eye 08/04/2016  . Prostate cancer ([place]) 07/15/2016  . History of prostatectomy 07/15/2016  . Vitamin D deficiency 05/31/2015  . Hyperlipidemia LDL goal <100 05/31/2015  . Seasonal allergic rhinitis 05/31/2015  . Chronic pain of both shoulders 05/31/2015  . Family history of malignant neoplasm of prostate 07/26/2012  . Hematuria, microscopic 07/26/2012    Past Surgical History:  Procedure Laterality Date  . COLONOSCOPY  2010   Dr Sula Rumple  . excision skin cyst  06-19-14   Dr. Festus Aloe CYST  . PELVIC LYMPH NODE DISSECTION N/A 07/15/2016   Procedure: PELVIC LYMPH NODE DISSECTION;  Surgeon: Nickie Retort, MD;  Location: [facility] ORS;  Service: Urology;  Laterality: N/A;  . ROBOT ASSISTED LAPAROSCOPIC RADICAL PROSTATECTOMY N/A 07/15/2016   Procedure: ROBOTIC  ASSISTED LAPAROSCOPIC RADICAL PROSTATECTOMY;  Surgeon: Nickie Retort, MD;  Location: [facility] ORS;  Service: Urology;  Laterality: N/A;    Family History  Problem Relation Age of Onset  . Heart disease Mother   . Heart attack Mother   . Diabetes Father   . Heart attack Father   . Dementia Father   . Cancer Sister        Breast  . Cancer Brother        Prostate  . Healthy Sister   . Stroke Brother     Social History   Tobacco Use  . Smoking status: Former Smoker    Packs/day: 0.50    Years: 40.00    Pack years: 20.00    Types: Cigarettes    Quit date:  03/23/2016    Years since quitting: 3.7  . Smokeless tobacco: Never Used  . Tobacco comment: to stay quit   Substance Use Topics  . Alcohol use: Yes    Alcohol/week: 8.0 standard drinks    Types: 8 Standard drinks or equivalent per week     Current Outpatient Medications:  .  aspirin 81 MG tablet, Take 81 mg by mouth daily., Disp: , Rfl:  .  brimonidine-timolol (COMBIGAN) 0.2-0.5 % ophthalmic solution, Place 1 drop into both eyes every 12 (twelve) hours., Disp: , Rfl:  .  Cholecalciferol (VITAMIN D) 2000 units CAPS, Take 1 capsule (2,000 Units total) by mouth daily., Disp: 30 capsule, Rfl: 0 .  fluticasone (FLONASE) 50 MCG/ACT nasal spray, Place 2 sprays into both nostrils daily., Disp: 16 g, Rfl: 6 .  latanoprost (XALATAN) 0.005 % ophthalmic solution, Place 1 drop into both eyes at bedtime. , Disp: , Rfl:  .  lisinopril (ZESTRIL) 10 MG tablet, Take 1-2 tablets (10-20 mg total) by mouth 2 (two) times daily. 20 mg in am and one in pm, Disp: 270 tablet, Rfl: 0 .  rosuvastatin (CRESTOR) 40 MG tablet, Take 1 tablet (40 mg total) by mouth daily. In place of Atorvastatin, Disp: 90 tablet, Rfl: 1 .  sildenafil (VIAGRA) 100 MG tablet, Take 1 tablet (100 mg total) by mouth daily as needed for erectile dysfunction., Disp: 30 tablet, Rfl: 0 .  traZODone (DESYREL) 100 MG tablet, Take 1 tablet (100 mg total) by mouth at bedtime., Disp: 90 tablet, Rfl: 0 .  triamcinolone cream (KENALOG) 0.1 %, APPLY  CREAM EXTERNALLY TWICE DAILY, Disp: 45 g, Rfl: 0  No Known Allergies  I personally reviewed active problem list, medication list, allergies, family history, social history, health maintenance with the patient/caregiver today.   ROS  Constitutional: Negative for fever or weight change.  Respiratory: Negative for cough and shortness of breath.   Cardiovascular: Negative for chest pain or palpitations.  Gastrointestinal: Negative for abdominal pain, no bowel changes.  Musculoskeletal: Negative for gait  problem or joint swelling.  Skin: Negative for rash.  Neurological: Positive  for dizziness - when he gets up quickly but seldom but no  headache.  No other specific complaints in a complete review of systems (except as listed in HPI above).   Objective  Vitals:   12/29/19 1022  BP: 104/80  Pulse: 78  Temp: (!) 97.1 F (36.2 C)  TempSrc: Temporal  Weight: 183 lb 14.4 oz (83.4 kg)    Body mass index is 27.16 kg/m.  Physical Exam  Constitutional: Patient appears well-developed and well-nourished. Overweight.  No distress.  HEENT: head atraumatic, normocephalic, pupils equal and reactive to light Cardiovascular: Normal rate, regular rhythm  and normal heart sounds.  No murmur heard. No BLE edema. Pulmonary/Chest: Effort normal and breath sounds normal. No respiratory distress. Abdominal: Soft.  There is no tenderness. Psychiatric: Patient has a normal mood and affect. behavior is normal. Judgment and thought content normal.  PHQ2/9: Depression screen [facility] 2/9 12/29/2019 10/20/2019 06/02/2019 03/01/2019 02/21/2019  Decreased Interest 0 0 1 0 1  Down, Depressed, Hopeless 0 0 1 0 1  PHQ - 2 Score 0 0 2 0 2  Altered sleeping 0 0 0 0 0  Tired, decreased energy 0 0 0 0 0  Change in appetite 0 0 1 0 0  Feeling bad or failure about yourself  0 0 1 0 0  Trouble concentrating 0 0 1 0 0  Moving slowly or fidgety/restless 0 0 0 0 1  Suicidal thoughts 0 0 0 0 0  PHQ-9 Score 0 0 5 0 3  Difficult doing work/chores - Not difficult at all Somewhat difficult - Not difficult at all  Some recent data might be hidden    phq 9 is negative  Fall Risk: Fall Risk  12/29/2019 10/20/2019 06/02/2019 03/01/2019 02/21/2019  Falls in the past year? 0 0 0 0 0  Number falls in past yr: 0 0 0 0 0  Injury with Fall? 0 0 0 0 0  Risk for fall due to : - - - - -  Risk for fall due to: Comment - - - - -  Follow up - Falls evaluation completed - - Falls prevention discussed     Functional Status Survey: Is the  patient deaf or have difficulty hearing?: No Does the patient have difficulty seeing, even when wearing glasses/contacts?: No Does the patient have difficulty concentrating, remembering, or making decisions?: No Does the patient have difficulty walking or climbing stairs?: No Does the patient have difficulty dressing or bathing?: No Does the patient have difficulty doing errands alone such as visiting a doctor's office or shopping?: No    Assessment & Plan  1. Atherosclerosis of aorta (HCC)  - rosuvastatin (CRESTOR) 40 MG tablet; Take 1 tablet (40 mg total) by mouth daily. In place of Atorvastatin  Dispense: 90 tablet; Refill: 1  2. Essential hypertension  We have tried cutting down on dose but his bp spikes when we do, advised to stay hydrated when doing yard work or staying in the heat , get up slowly and let us know if dizziness becomes for frequent - lisinopril (ZESTRIL) 10 MG tablet; Take 1-2 tablets (10-20 mg total) by mouth 2 (two) times daily. 20 mg in am and one in pm  Dispense: 270 tablet; Refill: 1  3. Other neutropenia ([place])  Stable for years, we will recheck on his next visit   4. Vitamin D deficiency  Taking supplementation   5. Centrilobular emphysema (HCC)  Not on medication, quit smoking  6. Atherosclerosis of native coronary artery of native heart without angina pectoris  On statin, ace and aspirin, no symptoms   7. Alcoholism /alcohol abuse ([place])  Still drinking one pint of liquor on weekends

## 2020-01-03 ENCOUNTER — Other Ambulatory Visit: Payer: Self-pay

## 2020-01-03 ENCOUNTER — Other Ambulatory Visit
Admission: RE | Admit: 2020-01-03 | Discharge: 2020-01-03 | Disposition: A | Discharge status: Home / Self Care | Payer: Medicare HMO | Source: Ambulatory Visit | Attending: Gastroenterology | Admitting: Gastroenterology

## 2020-01-03 DIAGNOSIS — Z01812 Encounter for preprocedural laboratory examination: Secondary | ICD-10-CM | POA: Diagnosis not present

## 2020-01-03 DIAGNOSIS — Z20822 Contact with and (suspected) exposure to covid-19: Secondary | ICD-10-CM | POA: Diagnosis not present

## 2020-01-03 LAB — SARS CORONAVIRUS 2 (TAT 6-24 HRS): SARS Coronavirus 2: NEGATIVE

## 2020-01-03 MED ORDER — PEG 3350-KCL-NABCB-NACL-NASULF 236 G PO SOLR: ORAL | 0 refills | Status: DC

## 2020-01-04 ENCOUNTER — Encounter: Payer: Self-pay | Admitting: Gastroenterology

## 2020-01-05 ENCOUNTER — Other Ambulatory Visit: Payer: Self-pay

## 2020-01-05 ENCOUNTER — Ambulatory Visit
Admission: RE | Admit: 2020-01-05 | Discharge: 2020-01-05 | Disposition: A | Discharge status: Home / Self Care | Payer: Medicare HMO | Attending: Gastroenterology | Admitting: Gastroenterology

## 2020-01-05 ENCOUNTER — Ambulatory Visit: Payer: Medicare HMO | Admitting: Anesthesiology

## 2020-01-05 ENCOUNTER — Encounter
Admission: RE | Disposition: A | Discharge status: Home / Self Care | Payer: Self-pay | Source: Home / Self Care | Attending: Gastroenterology

## 2020-01-05 ENCOUNTER — Encounter: Payer: Self-pay | Admitting: Gastroenterology

## 2020-01-05 DIAGNOSIS — E785 Hyperlipidemia, unspecified: Secondary | ICD-10-CM | POA: Diagnosis not present

## 2020-01-05 DIAGNOSIS — I10 Essential (primary) hypertension: Secondary | ICD-10-CM | POA: Diagnosis not present

## 2020-01-05 DIAGNOSIS — Z1211 Encounter for screening for malignant neoplasm of colon: Secondary | ICD-10-CM | POA: Diagnosis not present

## 2020-01-05 DIAGNOSIS — K219 Gastro-esophageal reflux disease without esophagitis: Secondary | ICD-10-CM | POA: Diagnosis not present

## 2020-01-05 DIAGNOSIS — H409 Unspecified glaucoma: Secondary | ICD-10-CM | POA: Insufficient documentation

## 2020-01-05 DIAGNOSIS — D126 Benign neoplasm of colon, unspecified: Secondary | ICD-10-CM | POA: Diagnosis not present

## 2020-01-05 DIAGNOSIS — Z87891 Personal history of nicotine dependence: Secondary | ICD-10-CM | POA: Diagnosis not present

## 2020-01-05 DIAGNOSIS — Z7982 Long term (current) use of aspirin: Secondary | ICD-10-CM | POA: Insufficient documentation

## 2020-01-05 DIAGNOSIS — N400 Enlarged prostate without lower urinary tract symptoms: Secondary | ICD-10-CM | POA: Diagnosis not present

## 2020-01-05 DIAGNOSIS — F419 Anxiety disorder, unspecified: Secondary | ICD-10-CM | POA: Diagnosis not present

## 2020-01-05 DIAGNOSIS — Z79899 Other long term (current) drug therapy: Secondary | ICD-10-CM | POA: Insufficient documentation

## 2020-01-05 DIAGNOSIS — Z8249 Family history of ischemic heart disease and other diseases of the circulatory system: Secondary | ICD-10-CM | POA: Insufficient documentation

## 2020-01-05 DIAGNOSIS — Z86010 Personal history of colonic polyps: Secondary | ICD-10-CM | POA: Diagnosis not present

## 2020-01-05 DIAGNOSIS — K621 Rectal polyp: Secondary | ICD-10-CM | POA: Insufficient documentation

## 2020-01-05 DIAGNOSIS — M1990 Unspecified osteoarthritis, unspecified site: Secondary | ICD-10-CM | POA: Insufficient documentation

## 2020-01-05 DIAGNOSIS — R69 Illness, unspecified: Secondary | ICD-10-CM | POA: Diagnosis not present

## 2020-01-05 HISTORY — PX: COLONOSCOPY WITH PROPOFOL: SHX5780

## 2020-01-05 SURGERY — COLONOSCOPY WITH PROPOFOL
Anesthesia: General

## 2020-01-05 MED ORDER — LIDOCAINE HCL (CARDIAC) PF 100 MG/5ML IV SOSY
PREFILLED_SYRINGE | INTRAVENOUS | Status: DC | PRN
  Administered 2020-01-05: 40 mg via INTRAVENOUS

## 2020-01-05 MED ORDER — SODIUM CHLORIDE 0.9 % IV SOLN: INTRAVENOUS | Status: DC

## 2020-01-05 MED ORDER — PROPOFOL 10 MG/ML IV BOLUS
INTRAVENOUS | Status: DC | PRN
  Administered 2020-01-05: 150 ug/kg/min via INTRAVENOUS

## 2020-01-05 NOTE — Anesthesia Preprocedure Evaluation (Signed)
Anesthesia Evaluation  Patient identified by MRN, date of birth, ID band Patient awake    Reviewed: Allergy & Precautions, H&P , NPO status , Patient's Chart, lab work & pertinent test results, reviewed documented beta blocker date and time   History of Anesthesia Complications Negative for: history of anesthetic complications  Airway Mallampati: II  TM Distance: >3 FB Neck ROM: full    Dental  (+) Missing, Poor Dentition   Pulmonary neg pulmonary ROS, former smoker,    Pulmonary exam normal breath sounds clear to auscultation       Cardiovascular Exercise Tolerance: Good hypertension, (-) angina(-) CAD, (-) Past MI, (-) Cardiac Stents and (-) CABG Normal cardiovascular exam(-) dysrhythmias (-) Valvular Problems/Murmurs Rhythm:regular Rate:Normal     Neuro/Psych PSYCHIATRIC DISORDERS Anxiety negative neurological ROS     GI/Hepatic Neg liver ROS, GERD  ,  Endo/Other  negative endocrine ROS  Renal/GU negative Renal ROS  negative genitourinary   Musculoskeletal   Abdominal   Peds  Hematology negative hematology ROS (+)   Anesthesia Other Findings Past Medical History: No date: Anxiety     Comment: Panic attacks in the past No date: Arthritis No date: Benign prostatic hypertrophy without lower uri* No date: Cancer [facility])     Comment: Prostate No date: Colon polyp No date: GERD (gastroesophageal reflux disease) No date: Glaucoma (increased eye pressure)     Comment: Left eye only No date: Hyperlipidemia No date: Hypertension   Reproductive/Obstetrics negative OB ROS                             Anesthesia Physical  Anesthesia Plan  ASA: II  Anesthesia Plan: General   Post-op Pain Management:    Induction: Intravenous  PONV Risk Score and Plan: 2 and Propofol infusion and TIVA  Airway Management Planned: Natural Airway and Nasal Cannula  Additional Equipment:   Intra-op  Plan:   Post-operative Plan:   Informed Consent: I have reviewed the patients History and Physical, chart, labs and discussed the procedure including the risks, benefits and alternatives for the proposed anesthesia with the patient or authorized representative who has indicated his/her understanding and acceptance.     Dental Advisory Given  Plan Discussed with: Anesthesiologist, CRNA and Surgeon  Anesthesia Plan Comments:         Anesthesia Quick Evaluation

## 2020-01-05 NOTE — Transfer of Care (Signed)
Immediate Anesthesia Transfer of Care Note  Patient: Ernest Sweeney  Procedure(s) Performed: COLONOSCOPY WITH PROPOFOL (N/A )  Patient Location: PACU  Anesthesia Type:General  Level of Consciousness: drowsy  Airway & Oxygen Therapy: Patient Spontanous Breathing  Post-op Assessment: Report given to RN and Post -op Vital signs reviewed and stable  Post vital signs: stable  Last Vitals:  Vitals Value Taken Time  BP 93/71 01/05/20 1122  Temp    Pulse 87 01/05/20 1123  Resp 17 01/05/20 1123  SpO2 98 % 01/05/20 1123  Vitals shown include unvalidated device data.  Last Pain:  Vitals:   01/05/20 0945  TempSrc: Temporal  PainSc: 0-No pain         Complications: No complications documented.

## 2020-01-05 NOTE — Op Note (Signed)
[facility] Gastroenterology Patient Name: Ernest Sweeney Procedure Date: 01/05/2020 11:01 AM MRN: [id number] Account #: [id number] Date of Birth: [birth year] Admit Type: Outpatient Age: 69 Room: [facility] ENDO ROOM 2 Gender: Male Note Status: Finalized Procedure:             Colonoscopy Indications:           High risk colon cancer surveillance: Personal history                         of colonic polyps, Last colonoscopy: March 2016 Providers:             [name] MD, MD Referring MD:          Bethena Roys. Sowles, MD (Referring MD) Medicines:             Monitored Anesthesia Care Complications:         No immediate complications. Procedure:             Pre-Anesthesia Assessment:                        - Prior to the procedure, a History and Physical was                         performed, and patient medications, allergies and                         sensitivities were reviewed. The patient's tolerance                         of previous anesthesia was reviewed.                        - The risks and benefits of the procedure and the                         sedation options and risks were discussed with the                         patient. All questions were answered and informed                         consent was obtained.                        - ASA Grade Assessment: II - A patient with mild                         systemic disease.                        After obtaining informed consent, the colonoscope was                         passed under direct vision. Throughout the procedure,                         the patient's blood pressure, pulse, and oxygen                         saturations were  monitored continuously. The                         Colonoscope was introduced through the anus and                         advanced to the the cecum, identified by the                         appendiceal orifice, IC valve and transillumination.                         The  colonoscopy was performed with ease. The patient                         tolerated the procedure well. The quality of the bowel                         preparation was excellent. Findings:      The perianal and digital rectal examinations were normal.      Two sessile polyps were found in the rectum and cecum. The polyps were 4       to 6 mm in size. These polyps were removed with a cold snare. Resection       and retrieval were complete.      The exam was otherwise without abnormality on direct and retroflexion       views.      Multiple small-mouthed diverticula were found in the entire colon. Impression:            - Two 4 to 6 mm polyps in the rectum and in the cecum,                         removed with a cold snare. Resected and retrieved.                        - The examination was otherwise normal on direct and                         retroflexion views. Recommendation:        - Discharge patient to home (with escort).                        - Resume previous diet.                        - Continue present medications.                        - Await pathology results.                        - Repeat colonoscopy for surveillance based on                         pathology results. Procedure Code(s):     --- Professional ---                        [phone], Colonoscopy, flexible; with removal of  tumor(s), polyp(s), or other lesion(s) by snare                         technique Diagnosis Code(s):     --- Professional ---                        Z86.010, Personal history of colonic polyps                        K62.1, Rectal polyp                        K63.5, Polyp of colon CPT copyright 2019 American Medical Association. All rights reserved. The codes documented in this report are preliminary and upon coder review may  be revised to meet current compliance requirements. [name], MD [name] MD, MD 01/05/2020 11:23:27 AM This report has been signed  electronically. Number of Addenda: 0 Note Initiated On: 01/05/2020 11:01 AM Scope Withdrawal Time: 0 hours 11 minutes 36 seconds  Total Procedure Duration: 0 hours 13 minutes 40 seconds  Estimated Blood Loss:  Estimated blood loss: none.      [facility]

## 2020-01-05 NOTE — Anesthesia Postprocedure Evaluation (Signed)
Anesthesia Post Note  Patient: Ernest Sweeney  Procedure(s) Performed: COLONOSCOPY WITH PROPOFOL (N/A )  Patient location during evaluation: Endoscopy Anesthesia Type: General Level of consciousness: awake and alert Pain management: pain level controlled Vital Signs Assessment: post-procedure vital signs reviewed and stable Respiratory status: spontaneous breathing, nonlabored ventilation, respiratory function stable and patient connected to nasal cannula oxygen Cardiovascular status: blood pressure returned to baseline and stable Postop Assessment: no apparent nausea or vomiting Anesthetic complications: no   No complications documented.   Last Vitals:  Vitals:   01/05/20 1125 01/05/20 1145  BP:  112/87  Pulse:    Resp:    Temp: 37 C   SpO2:      Last Pain:  Vitals:   01/05/20 1145  TempSrc:   PainSc: 0-No pain                 Martha Clan

## 2020-01-05 NOTE — H&P (Signed)
[name], MD [address] [place], [place], [place], [address] [address], [place], [place], [place], [address] Phone: [phone]  Fax: [phone]  Primary Care Physician:  [name], MD   Pre-Procedure History & Physical: HPI:  Ernest Sweeney is a 69 y.o. male is here for an colonoscopy.   Past Medical History:  Diagnosis Date  . Anxiety    Panic attacks in the past  . Arthritis   . Benign prostatic hypertrophy without lower urinary tract symptoms   . Cancer [facility])    Prostate  . Colon polyp   . GERD (gastroesophageal reflux disease)   . Glaucoma (increased eye pressure)    Left eye only  . Hyperlipidemia   . Hypertension     Past Surgical History:  Procedure Laterality Date  . COLONOSCOPY  2010   Dr Sula Rumple  . excision skin cyst  06-19-14   Dr. Festus Aloe CYST  . PELVIC LYMPH NODE DISSECTION N/A 07/15/2016   Procedure: PELVIC LYMPH NODE DISSECTION;  Surgeon: Nickie Retort, MD;  Location: [facility] ORS;  Service: Urology;  Laterality: N/A;  . ROBOT ASSISTED LAPAROSCOPIC RADICAL PROSTATECTOMY N/A 07/15/2016   Procedure: ROBOTIC ASSISTED LAPAROSCOPIC RADICAL PROSTATECTOMY;  Surgeon: Nickie Retort, MD;  Location: [facility] ORS;  Service: Urology;  Laterality: N/A;    Prior to Admission medications   Medication Sig Start Date End Date Taking? Authorizing Provider  brimonidine-timolol (COMBIGAN) 0.2-0.5 % ophthalmic solution Place 1 drop into both eyes every 12 (twelve) hours.   Yes [provider]  fluticasone (FLONASE) 50 MCG/ACT nasal spray Place 2 sprays into both nostrils daily. 06/02/19  Yes Sowles, Drue Stager, MD  latanoprost (XALATAN) 0.005 % ophthalmic solution Place 1 drop into both eyes at bedtime.    Yes Porfilio, Gwyndolyn Saxon, MD  lisinopril (ZESTRIL) 10 MG tablet Take 1-2 tablets (10-20 mg total) by mouth 2 (two) times daily. 20 mg in am and one in pm 12/29/19  Yes Sowles, Drue Stager, MD  rosuvastatin (CRESTOR) 40 MG tablet Take 1 tablet (40  mg total) by mouth daily. In place of Atorvastatin 12/29/19  Yes Sowles, Drue Stager, MD  traZODone (DESYREL) 100 MG tablet Take 1 tablet (100 mg total) by mouth at bedtime. 03/01/19  Yes Sowles, Drue Stager, MD  triamcinolone cream (KENALOG) 0.1 % APPLY  CREAM EXTERNALLY TWICE DAILY 09/23/19  Yes Sowles, Drue Stager, MD  aspirin 81 MG tablet Take 81 mg by mouth daily.    [provider]  Cholecalciferol (VITAMIN D) 2000 units CAPS Take 1 capsule (2,000 Units total) by mouth daily. 10/03/15   [name], MD  polyethylene glycol (GOLYTELY) 236 g solution Drink 8 oz every 20-30 minutes until entire prep is finished 01/03/20   [name], MD  sildenafil (VIAGRA) 100 MG tablet Take 1 tablet (100 mg total) by mouth daily as needed for erectile dysfunction. 06/02/19   [name], MD    Allergies as of 12/14/2019  . (No Known Allergies)    Family History  Problem Relation Age of Onset  . Heart disease Mother   . Heart attack Mother   . Diabetes Father   . Heart attack Father   . Dementia Father   . Cancer Sister        Breast  . Cancer Brother        Prostate  . Healthy Sister   . Stroke Brother     Social History   Socioeconomic History  . Marital status: Widowed    Spouse name: Not on file  .  Number of children: 0  . Years of education: Not on file  . Highest education level: 12th grade  Occupational History  . Occupation: custodian     Comment: part time   Tobacco Use  . Smoking status: Former Smoker    Packs/day: 0.50    Years: 40.00    Pack years: 20.00    Types: Cigarettes    Quit date: 03/23/2016    Years since quitting: 3.7  . Smokeless tobacco: Never Used  . Tobacco comment: to stay quit   Vaping Use  . Vaping Use: Never used  Substance and Sexual Activity  . Alcohol use: Yes    Alcohol/week: 2.0 - 4.0 standard drinks    Types: 2 - 4 Standard drinks or equivalent per week  . Drug use: No  . Sexual activity: Not Currently    Birth control/protection: Condom   Other Topics Concern  . Not on file  Social History Narrative  . Not on file   Social Determinants of Health   Financial Resource Strain:   . Difficulty of Paying Living Expenses:   Food Insecurity: No Food Insecurity  . Worried About Charity fundraiser in the Last Year: Never true  . Ran Out of Food in the Last Year: Never true  Transportation Needs: No Transportation Needs  . Lack of Transportation (Medical): No  . Lack of Transportation (Non-Medical): No  Physical Activity: Sufficiently Active  . Days of Exercise per Week: 5 days  . Minutes of Exercise per Session: 30 min  Stress: No Stress Concern Present  . Feeling of Stress : Only a little  Social Connections: Unknown  . Frequency of Communication with Friends and Family: More than three times a week  . Frequency of Social Gatherings with Friends and Family: Once a week  . Attends Religious Services: Never  . Active Member of Clubs or Organizations: No  . Attends Archivist Meetings: Never  . Marital Status: Not on file  Intimate Partner Violence: Not At Risk  . Fear of Current or Ex-Partner: No  . Emotionally Abused: No  . Physically Abused: No  . Sexually Abused: No    Review of Systems: See HPI, otherwise negative ROS  Physical Exam: BP 128/90   Pulse 71   Temp (!) 96.6 F (35.9 C) (Temporal)   Resp 18   Ht 5\' 9"  (1.753 m)   Wt 82.1 kg   SpO2 100%   BMI 26.73 kg/m  General:   Alert,  pleasant and cooperative in NAD Head:  Normocephalic and atraumatic. Neck:  Supple; no masses or thyromegaly. Lungs:  Clear throughout to auscultation, normal respiratory effort.    Heart:  +S1, +S2, Regular rate and rhythm, No edema. Abdomen:  Soft, nontender and nondistended. Normal bowel sounds, without guarding, and without rebound.   Neurologic:  Alert and  oriented x4;  grossly normal neurologically.  Impression/Plan: Ernest Sweeney is here for an colonoscopy to be performed for surveillance due to  prior history of colon polyps   Risks, benefits, limitations, and alternatives regarding  colonoscopy have been reviewed with the patient.  Questions have been answered.  All parties agreeable.   [name], MD  01/05/2020, 10:44 AM

## 2020-01-08 ENCOUNTER — Encounter: Payer: Self-pay | Admitting: Gastroenterology

## 2020-01-08 LAB — SURGICAL PATHOLOGY

## 2020-01-26 ENCOUNTER — Telehealth: Payer: Self-pay | Admitting: Family Medicine

## 2020-01-26 NOTE — Chronic Care Management (AMB) (Signed)
  Chronic Care Management   Note  01/26/2020 Name: Ernest Sweeney MRN: [id number] DOB: 23-Apr-1951  MITSUGI SCHRADER is a 69 y.o. year old male who is a primary care patient of [name], MD. I reached out to [name] by phone today in response to a referral sent by Mr. Kees [facility]dro[facility] health plan.     Mr. Dubie was given information about Chronic Care Management services today including:  1. CCM service includes personalized support from designated clinical staff supervised by his physician, including individualized plan of care and coordination with other care providers 2. 24/7 contact phone numbers for assistance for urgent and routine care needs. 3. Service will only be billed when office clinical staff spend 20 minutes or more in a month to coordinate care. 4. Only one practitioner may furnish and bill the service in a calendar month. 5. The patient may stop CCM services at any time (effective at the end of the month) by phone call to the office staff. 6. The patient will be responsible for cost sharing (co-pay) of up to 20% of the service fee (after annual deductible is met).  Patient agreed to services and verbal consent obtained.   Follow up plan: Telephone appointment with care management team member scheduled for:02/16/2020  Noreene Larsson, [place], [place], [place] [address] Direct Dial: [phone] [email or web address] Website: [email or web address]

## 2020-01-29 DIAGNOSIS — H2511 Age-related nuclear cataract, right eye: Secondary | ICD-10-CM | POA: Diagnosis not present

## 2020-01-29 DIAGNOSIS — H401132 Primary open-angle glaucoma, bilateral, moderate stage: Secondary | ICD-10-CM | POA: Diagnosis not present

## 2020-02-16 ENCOUNTER — Telehealth: Payer: Self-pay

## 2020-02-16 ENCOUNTER — Telehealth: Payer: Medicare HMO

## 2020-02-16 NOTE — Telephone Encounter (Signed)
°  Chronic Care Management   Outreach Note  02/16/2020 Name: KAEDON FANELLI MRN: [id number] DOB: February 16, 1951  Primary Care Provider: [name], MD Reason for referral : Chronic Care Management   An unsuccessful telephone outreach was attempted today. Mr. Putt was referred to the case management team for assistance with care management and care coordination.   A HIPAA compliant voice message was left today requesting a return call.   PLAN The care management team will reach out to Mr. Bolger again within the next two weeks.   [facility] Care Management [phone]

## 2020-02-27 ENCOUNTER — Other Ambulatory Visit: Payer: Self-pay

## 2020-02-27 ENCOUNTER — Ambulatory Visit (INDEPENDENT_AMBULATORY_CARE_PROVIDER_SITE_OTHER): Payer: Medicare HMO

## 2020-02-27 VITALS — BP 112/82 | HR 89 | Temp 97.7°F | Resp 16 | Ht 69.0 in | Wt 181.6 lb

## 2020-02-27 DIAGNOSIS — Z0000 Encounter for general adult medical examination without abnormal findings: Secondary | ICD-10-CM

## 2020-02-27 NOTE — Progress Notes (Signed)
Subjective:   Ernest Sweeney is a 69 y.o. male who presents for Medicare Annual/Subsequent preventive examination.  Review of Systems     Cardiac Risk Factors include: advanced age (>39men, >27 women);hypertension;dyslipidemia;male gender     Objective:    Today's Vitals   02/27/20 1036  BP: 112/82  Pulse: 89  Resp: 16  Temp: 97.7 F (36.5 C)  TempSrc: Oral  SpO2: 99%  Weight: 181 lb 9.6 oz (82.4 kg)  Height: 5\' 9"  (1.753 m)   Body mass index is 26.82 kg/m.  Advanced Directives 02/27/2020 01/05/2020 02/21/2019 09/28/2018 06/22/2018 03/02/2018 02/11/2018  Does Patient Have a Medical Advance Directive? No No No No No No No  Would patient like information on creating a medical advance directive? No - Patient declined No - Patient declined Yes (MAU/Ambulatory/Procedural Areas - Information given) No - Patient declined No - Patient declined No - Patient declined Yes (MAU/Ambulatory/Procedural Areas - Information given)    Current Medications (verified) Outpatient Encounter Medications as of 02/27/2020  Medication Sig  . aspirin 81 MG tablet Take 81 mg by mouth daily.  . brimonidine-timolol (COMBIGAN) 0.2-0.5 % ophthalmic solution Place 1 drop into both eyes every 12 (twelve) hours.  . Cholecalciferol (VITAMIN D) 2000 units CAPS Take 1 capsule (2,000 Units total) by mouth daily.  . fluticasone (FLONASE) 50 MCG/ACT nasal spray Place 2 sprays into both nostrils daily.  Marland Kitchen latanoprost (XALATAN) 0.005 % ophthalmic solution Place 1 drop into both eyes at bedtime.   Marland Kitchen lisinopril (ZESTRIL) 10 MG tablet Take 1-2 tablets (10-20 mg total) by mouth 2 (two) times daily. 20 mg in am and one in pm  . rosuvastatin (CRESTOR) 40 MG tablet Take 1 tablet (40 mg total) by mouth daily. In place of Atorvastatin  . sildenafil (VIAGRA) 100 MG tablet Take 1 tablet (100 mg total) by mouth daily as needed for erectile dysfunction.  . traZODone (DESYREL) 100 MG tablet Take 1 tablet (100 mg total) by mouth at  bedtime.  . triamcinolone cream (KENALOG) 0.1 % APPLY  CREAM EXTERNALLY TWICE DAILY  . [DISCONTINUED] polyethylene glycol (GOLYTELY) 236 g solution Drink 8 oz every 20-30 minutes until entire prep is finished   No facility-administered encounter medications on file as of 02/27/2020.    Allergies (verified) Patient has no known allergies.   History: Past Medical History:  Diagnosis Date  . Anxiety    Panic attacks in the past  . Arthritis   . Benign prostatic hypertrophy without lower urinary tract symptoms   . Cancer [facility])    Prostate  . Colon polyp   . GERD (gastroesophageal reflux disease)   . Glaucoma (increased eye pressure)    Left eye only  . Hyperlipidemia   . Hypertension    Past Surgical History:  Procedure Laterality Date  . COLONOSCOPY  2010   Dr Sula Rumple  . COLONOSCOPY WITH PROPOFOL N/A 01/05/2020   Procedure: COLONOSCOPY WITH PROPOFOL;  Surgeon: [name], MD;  Location: [facility] ENDOSCOPY;  Service: Gastroenterology;  Laterality: N/A;  . excision skin cyst  06-19-14   Dr. Festus Aloe CYST  . PELVIC LYMPH NODE DISSECTION N/A 07/15/2016   Procedure: PELVIC LYMPH NODE DISSECTION;  Surgeon: Nickie Retort, MD;  Location: [facility] ORS;  Service: Urology;  Laterality: N/A;  . ROBOT ASSISTED LAPAROSCOPIC RADICAL PROSTATECTOMY N/A 07/15/2016   Procedure: ROBOTIC ASSISTED LAPAROSCOPIC RADICAL PROSTATECTOMY;  Surgeon: Nickie Retort, MD;  Location: [facility] ORS;  Service: Urology;  Laterality: N/A;   Family History  Problem Relation  Age of Onset  . Heart disease Mother   . Heart attack Mother   . Diabetes Father   . Heart attack Father   . Dementia Father   . Cancer Sister        Breast  . Cancer Brother        Prostate  . Healthy Sister   . Stroke Brother    Social History   Socioeconomic History  . Marital status: Widowed    Spouse name: Not on file  . Number of children: 0  . Years of education: Not on file  . Highest education level: 12th grade    Occupational History  . Occupation: custodian     Comment: part time   Tobacco Use  . Smoking status: Former Smoker    Packs/day: 0.50    Years: 40.00    Pack years: 20.00    Types: Cigarettes    Quit date: 03/23/2016    Years since quitting: 3.9  . Smokeless tobacco: Never Used  . Tobacco comment: to stay quit   Vaping Use  . Vaping Use: Never used  Substance and Sexual Activity  . Alcohol use: Yes    Alcohol/week: 2.0 - 4.0 standard drinks    Types: 2 - 4 Standard drinks or equivalent per week    Comment: 1-2 times per week  . Drug use: No  . Sexual activity: Not Currently    Birth control/protection: Condom  Other Topics Concern  . Not on file  Social History Narrative  . Not on file   Social Determinants of Health   Financial Resource Strain: Medium Risk  . Difficulty of Paying Living Expenses: Somewhat hard  Food Insecurity: No Food Insecurity  . Worried About Charity fundraiser in the Last Year: Never true  . Ran Out of Food in the Last Year: Never true  Transportation Needs: No Transportation Needs  . Lack of Transportation (Medical): No  . Lack of Transportation (Non-Medical): No  Physical Activity: Sufficiently Active  . Days of Exercise per Week: 5 days  . Minutes of Exercise per Session: 30 min  Stress: No Stress Concern Present  . Feeling of Stress : Not at all  Social Connections: Socially Isolated  . Frequency of Communication with Friends and Family: More than three times a week  . Frequency of Social Gatherings with Friends and Family: Once a week  . Attends Religious Services: Never  . Active Member of Clubs or Organizations: No  . Attends Archivist Meetings: Never  . Marital Status: Widowed    Tobacco Counseling Counseling given: Not Answered Comment: to stay quit    Clinical Intake:  Pre-visit preparation completed: Yes  Pain : No/denies pain     BMI - recorded: 26.82 Nutritional Status: BMI 25 -29  Overweight Nutritional Risks: None Diabetes: No  How often do you need to have someone help you when you read instructions, pamphlets, or other written materials from your doctor or pharmacy?: 1 - Never    Interpreter Needed?: No  Information entered by :: Clemetine Marker LPN   Activities of Daily Living In your present state of health, do you have any difficulty performing the following activities: 02/27/2020 12/29/2019  Hearing? N N  Comment declines hearing aids -  Vision? N N  Difficulty concentrating or making decisions? N N  Walking or climbing stairs? N N  Dressing or bathing? N N  Doing errands, shopping? N N  Preparing Food and eating ? N -  Using the Toilet? N -  In the past six months, have you accidently leaked urine? N -  Do you have problems with loss of bowel control? N -  Managing your Medications? N -  Managing your Finances? N -  Housekeeping or managing your Housekeeping? N -  Some recent data might be hidden    Patient Care Team: [name], MD as PCP - General (Family Medicine) Abbie Sons, MD as Consulting Physician (Urology) Neldon Labella, RN as Registered Nurse  Indicate any recent Medical Services you may have received from other than [facility] providers in the past year (date may be approximate).     Assessment:   This is a routine wellness examination for [place].  Hearing/Vision screen  Hearing Screening   125Hz  250Hz  500Hz  1000Hz  2000Hz  3000Hz  4000Hz  6000Hz  8000Hz   Right ear:           Left ear:           Comments: Pt denies hearing difficulty  Vision Screening Comments: Annual vision screenings done at [facility] Dr. Michelene Heady  Dietary issues and exercise activities discussed: Current Exercise Habits: Home exercise routine, Type of exercise: walking, Time (Minutes): 30, Frequency (Times/Week): 5, Weekly Exercise (Minutes/Week): 150, Intensity: Moderate, Exercise limited by: None identified  Goals    . DIET - INCREASE  WATER INTAKE     Recommend to drink at least 6-8 8oz glasses of water per day.      Depression Screen PHQ 2/9 Scores 02/27/2020 12/29/2019 10/20/2019 06/02/2019 03/01/2019 02/21/2019 11/28/2018  PHQ - 2 Score 0 0 0 2 0 2 2  PHQ- 9 Score - 0 0 5 0 3 4    Fall Risk Fall Risk  02/27/2020 12/29/2019 10/20/2019 06/02/2019 03/01/2019  Falls in the past year? 0 0 0 0 0  Number falls in past yr: 0 0 0 0 0  Injury with Fall? 0 0 0 0 0  Risk for fall due to : No Fall Risks - - - -  Risk for fall due to: Comment - - - - -  Follow up Falls prevention discussed - Falls evaluation completed - -    Any stairs in or around the home? Yes  If so, are there any without handrails? No  Home free of loose throw rugs in walkways, pet beds, electrical cords, etc? Yes  Adequate lighting in your home to reduce risk of falls? Yes   ASSISTIVE DEVICES UTILIZED TO PREVENT FALLS:  Life alert? No  Use of a cane, walker or w/c? No  Grab bars in the bathroom? No  Shower chair or bench in shower? No  Elevated toilet seat or a handicapped toilet? No   TIMED UP AND GO:  Was the test performed? Yes .  Length of time to ambulate 10 feet: 4 sec.   Gait steady and fast without use of assistive device  Cognitive Function:     6CIT Screen 02/27/2020 02/21/2019 02/11/2018 02/05/2017  What Year? 0 points 0 points 0 points 0 points  What month? 0 points 0 points 0 points 0 points  What time? 0 points 0 points 0 points 0 points  Count back from 20 0 points 0 points 0 points 0 points  Months in reverse 0 points 0 points 0 points 0 points  Repeat phrase 2 points 2 points 0 points 8 points  Total Score 2 2 0 8    Immunizations Immunization History  Administered Date(s) Administered  . Fluad Quad(high Dose 65+)  05/02/2019  . Influenza, High Dose Seasonal PF 05/21/2016, 05/07/2017, 04/27/2018  . Influenza-Unspecified 04/12/2014, 04/27/2015  . Moderna SARS-COVID-2 Vaccination 08/23/2019, 09/20/2019  . Pneumococcal  Conjugate-13 02/05/2017  . Pneumococcal Polysaccharide-23 03/08/2012, 02/11/2018  . Tdap 04/03/2010  . Zoster 02/05/2016    TDAP status: Up to date   Flu Vaccine status: Up to date   Pneumococcal vaccine status: Up to date   Covid-19 vaccine status: Completed vaccines  Qualifies for Shingles Vaccine? Yes   Zostavax completed Yes   Shingrix Completed?: No.    Education has been provided regarding the importance of this vaccine. Patient has been advised to call insurance company to determine out of pocket expense if they have not yet received this vaccine. Advised may also receive vaccine at local pharmacy or Health Dept. Verbalized acceptance and understanding.  Screening Tests Health Maintenance  Topic Date Due  . INFLUENZA VACCINE  02/11/2020  . TETANUS/TDAP  04/03/2020  . COLONOSCOPY  01/04/2025  . COVID-19 Vaccine  Completed  . Hepatitis C Screening  Completed  . PNA vac Low Risk Adult  Completed    Health Maintenance  Health Maintenance Due  Topic Date Due  . INFLUENZA VACCINE  02/11/2020    Colorectal cancer screening: Completed 01/05/20. Repeat every 5 years  Lung Cancer Screening: (Low Dose CT Chest recommended if Age 55-80 years, 30 pack-year currently smoking OR have quit w/in 15years.) does qualify. Completed 09/21/19.   Additional Screening:  Hepatitis C Screening: does qualify; Completed 03/15/12  Vision Screening: Recommended annual ophthalmology exams for early detection of glaucoma and other disorders of the eye. Is the patient up to date with their annual eye exam?  Yes  Who is the provider or what is the name of the office in which the patient attends annual eye exams? [facility] Screening: Recommended annual dental exams for proper oral hygiene  Community Resource Referral / Chronic Care Management: CRR required this visit?  No   CCM required this visit?  No      Plan:     I have personally reviewed and noted the following in  the patient's chart:   . Medical and social history . Use of alcohol, tobacco or illicit drugs  . Current medications and supplements . Functional ability and status . Nutritional status . Physical activity . Advanced directives . List of other physicians . Hospitalizations, surgeries, and ER visits in previous 12 months . Vitals . Screenings to include cognitive, depression, and falls . Referrals and appointments  In addition, I have reviewed and discussed with patient certain preventive protocols, quality metrics, and best practice recommendations. A written personalized care plan for preventive services as well as general preventive health recommendations were provided to patient.     Clemetine Marker, LPN   [id number]   Nurse Notes: pt doing well and appreciative of visit today.

## 2020-02-27 NOTE — Patient Instructions (Signed)
Ernest Sweeney , Thank you for taking time to come for your Medicare Wellness Visit. I appreciate your ongoing commitment to your health goals. Please review the following plan we discussed and let me know if I can assist you in the future.   Screening recommendations/referrals: Colonoscopy: done 01/05/20. Repeat in 2026. Recommended yearly ophthalmology/optometry visit for glaucoma screening and checkup Recommended yearly dental visit for hygiene and checkup  Vaccinations: Influenza vaccine: done 05/02/19 Pneumococcal vaccine: done 02/11/18 Tdap vaccine: done 04/03/10 Shingles vaccine: Shingrix discussed. Please contact your pharmacy for coverage information.  Covid-19: done 08/23/19 & 09/20/19  Advanced directives: Please bring a copy of your health care power of attorney and living will to the office at your convenience once you have completed those documents.   Conditions/risks identified: Keep up the great work!  Next appointment: Follow up in one year for your annual wellness visit.   Preventive Care 69 Years and Older, Male Preventive care refers to lifestyle choices and visits with your health care provider that can promote health and wellness. What does preventive care include?  A yearly physical exam. This is also called an annual well check.  Dental exams once or twice a year.  Routine eye exams. Ask your health care provider how often you should have your eyes checked.  Personal lifestyle choices, including:  Daily care of your teeth and gums.  Regular physical activity.  Eating a healthy diet.  Avoiding tobacco and drug use.  Limiting alcohol use.  Practicing safe sex.  Taking low doses of aspirin every day.  Taking vitamin and mineral supplements as recommended by your health care provider. What happens during an annual well check? The services and screenings done by your health care provider during your annual well check will depend on your age, overall health,  lifestyle risk factors, and family history of disease. Counseling  Your health care provider may ask you questions about your:  Alcohol use.  Tobacco use.  Drug use.  Emotional well-being.  Home and relationship well-being.  Sexual activity.  Eating habits.  History of falls.  Memory and ability to understand (cognition).  Work and work Statistician. Screening  You may have the following tests or measurements:  Height, weight, and BMI.  Blood pressure.  Lipid and cholesterol levels. These may be checked every 5 years, or more frequently if you are over 38 years old.  Skin check.  Lung cancer screening. You may have this screening every year starting at age 69 if you have a 30-pack-year history of smoking and currently smoke or have quit within the past 15 years.  Fecal occult blood test (FOBT) of the stool. You may have this test every year starting at age 69.  Flexible sigmoidoscopy or colonoscopy. You may have a sigmoidoscopy every 5 years or a colonoscopy every 10 years starting at age 69.  Prostate cancer screening. Recommendations will vary depending on your family history and other risks.  Hepatitis C blood test.  Hepatitis B blood test.  Sexually transmitted disease (STD) testing.  Diabetes screening. This is done by checking your blood sugar (glucose) after you have not eaten for a while (fasting). You may have this done every 1-3 years.  Abdominal aortic aneurysm (AAA) screening. You may need this if you are a current or former smoker.  Osteoporosis. You may be screened starting at age 69 if you are at high risk. Talk with your health care provider about your test results, treatment options, and if necessary, the need  for more tests. Vaccines  Your health care provider may recommend certain vaccines, such as:  Influenza vaccine. This is recommended every year.  Tetanus, diphtheria, and acellular pertussis (Tdap, Td) vaccine. You may need a Td booster  every 10 years.  Zoster vaccine. You may need this after age 69.  Pneumococcal 13-valent conjugate (PCV13) vaccine. One dose is recommended after age 69.  Pneumococcal polysaccharide (PPSV23) vaccine. One dose is recommended after age 69. Talk to your health care provider about which screenings and vaccines you need and how often you need them. This information is not intended to replace advice given to you by your health care provider. Make sure you discuss any questions you have with your health care provider. Document Released: 07/26/2015 Document Revised: 03/18/2016 Document Reviewed: 04/30/2015 Elsevier Interactive Patient Education  2017 [place] Prevention in the Home Falls can cause injuries. They can happen to people of all ages. There are many things you can do to make your home safe and to help prevent falls. What can I do on the outside of my home?  Regularly fix the edges of walkways and driveways and fix any cracks.  Remove anything that might make you trip as you walk through a door, such as a raised step or threshold.  Trim any bushes or trees on the path to your home.  Use bright outdoor lighting.  Clear any walking paths of anything that might make someone trip, such as rocks or tools.  Regularly check to see if handrails are loose or broken. Make sure that both sides of any steps have handrails.  Any raised decks and porches should have guardrails on the edges.  Have any leaves, snow, or ice cleared regularly.  Use sand or salt on walking paths during winter.  Clean up any spills in your garage right away. This includes oil or grease spills. What can I do in the bathroom?  Use night lights.  Install grab bars by the toilet and in the tub and shower. Do not use towel bars as grab bars.  Use non-skid mats or decals in the tub or shower.  If you need to sit down in the shower, use a plastic, non-slip stool.  Keep the floor dry. Clean up any  water that spills on the floor as soon as it happens.  Remove soap buildup in the tub or shower regularly.  Attach bath mats securely with double-sided non-slip rug tape.  Do not have throw rugs and other things on the floor that can make you trip. What can I do in the bedroom?  Use night lights.  Make sure that you have a light by your bed that is easy to reach.  Do not use any sheets or blankets that are too big for your bed. They should not hang down onto the floor.  Have a firm chair that has side arms. You can use this for support while you get dressed.  Do not have throw rugs and other things on the floor that can make you trip. What can I do in the kitchen?  Clean up any spills right away.  Avoid walking on wet floors.  Keep items that you use a lot in easy-to-reach places.  If you need to reach something above you, use a strong step stool that has a grab bar.  Keep electrical cords out of the way.  Do not use floor polish or wax that makes floors slippery. If you must use wax, use  non-skid floor wax.  Do not have throw rugs and other things on the floor that can make you trip. What can I do with my stairs?  Do not leave any items on the stairs.  Make sure that there are handrails on both sides of the stairs and use them. Fix handrails that are broken or loose. Make sure that handrails are as long as the stairways.  Check any carpeting to make sure that it is firmly attached to the stairs. Fix any carpet that is loose or worn.  Avoid having throw rugs at the top or bottom of the stairs. If you do have throw rugs, attach them to the floor with carpet tape.  Make sure that you have a light switch at the top of the stairs and the bottom of the stairs. If you do not have them, ask someone to add them for you. What else can I do to help prevent falls?  Wear shoes that:  Do not have high heels.  Have rubber bottoms.  Are comfortable and fit you well.  Are closed  at the toe. Do not wear sandals.  If you use a stepladder:  Make sure that it is fully opened. Do not climb a closed stepladder.  Make sure that both sides of the stepladder are locked into place.  Ask someone to hold it for you, if possible.  Clearly mark and make sure that you can see:  Any grab bars or handrails.  First and last steps.  Where the edge of each step is.  Use tools that help you move around (mobility aids) if they are needed. These include:  Canes.  Walkers.  Scooters.  Crutches.  Turn on the lights when you go into a dark area. Replace any light bulbs as soon as they burn out.  Set up your furniture so you have a clear path. Avoid moving your furniture around.  If any of your floors are uneven, fix them.  If there are any pets around you, be aware of where they are.  Review your medicines with your doctor. Some medicines can make you feel dizzy. This can increase your chance of falling. Ask your doctor what other things that you can do to help prevent falls. This information is not intended to replace advice given to you by your health care provider. Make sure you discuss any questions you have with your health care provider. Document Released: 04/25/2009 Document Revised: 12/05/2015 Document Reviewed: 08/03/2014 Elsevier Interactive Patient Education  2017 [employer].

## 2020-04-04 ENCOUNTER — Other Ambulatory Visit: Payer: Self-pay

## 2020-04-04 ENCOUNTER — Inpatient Hospital Stay: Payer: Medicare HMO | Attending: Radiation Oncology

## 2020-04-04 DIAGNOSIS — C61 Malignant neoplasm of prostate: Secondary | ICD-10-CM | POA: Diagnosis not present

## 2020-04-04 LAB — PSA: Prostatic Specific Antigen: <0.01 ng/mL (ref 0.00–4.00)

## 2020-04-11 ENCOUNTER — Ambulatory Visit
Admission: RE | Admit: 2020-04-11 | Discharge: 2020-04-11 | Disposition: A | Discharge status: Home / Self Care | Payer: Medicare HMO | Source: Ambulatory Visit | Attending: Radiation Oncology | Admitting: Radiation Oncology

## 2020-04-11 ENCOUNTER — Other Ambulatory Visit: Payer: Self-pay

## 2020-04-11 ENCOUNTER — Encounter: Payer: Self-pay | Admitting: Radiation Oncology

## 2020-04-11 VITALS — BP 128/95 | HR 87 | Temp 96.1°F | Wt 180.2 lb

## 2020-04-11 DIAGNOSIS — Z923 Personal history of irradiation: Secondary | ICD-10-CM | POA: Insufficient documentation

## 2020-04-11 DIAGNOSIS — C61 Malignant neoplasm of prostate: Secondary | ICD-10-CM | POA: Insufficient documentation

## 2020-04-11 NOTE — Progress Notes (Signed)
Radiation Oncology Follow up Note  Name: Ernest Sweeney   Date:   04/11/2020 MRN:  [id number] DOB: 25-Sep-1950    This 69 y.o. male presents to the clinic today for 2-year follow-up status post salvage radiation therapy after robotic prostatectomy for Gleason 7 adenocarcinoma the prostate stage IIIb.  REFERRING PROVIDER: [name], MD  HPI: Patient is a 69 year old male now out 2 years having pleated salvage radiation therapy after completion of robotic prostatectomy for a T3 N0 M0 lesion.  He is seen today in routine follow-up is doing well.  Specifically denies any increased lower urinary tract symptoms diarrhea or fatigue his most recent PSA is less than 0.01.Marland Kitchen  He had a CT screening CT scan of his chest back in March which I have reviewed showing no evidence of pathology.  COMPLICATIONS OF TREATMENT: none  FOLLOW UP COMPLIANCE: keeps appointments   PHYSICAL EXAM:  BP (!) 128/95   Pulse 87   Temp (!) 96.1 F (35.6 C) (Tympanic)   Wt 180 lb 4 oz (81.8 kg)   BMI 26.62 kg/m  Well-developed well-nourished patient in NAD. HEENT reveals PERLA, EOMI, discs not visualized.  Oral cavity is clear. No oral mucosal lesions are identified. Neck is clear without evidence of cervical or supraclavicular adenopathy. Lungs are clear to A&P. Cardiac examination is essentially unremarkable with regular rate and rhythm without murmur rub or thrill. Abdomen is benign with no organomegaly or masses noted. Motor sensory and DTR levels are equal and symmetric in the upper and lower extremities. Cranial nerves II through XII are grossly intact. Proprioception is intact. No peripheral adenopathy or edema is identified. No motor or sensory levels are noted. Crude visual fields are within normal range.  RADIOLOGY RESULTS: CT scan reviewed compatible with above-stated findings  PLAN: Present time patient is doing well under excellent biochemical control of his prostate cancer now 2 years out.  Of asked to see  him back in 1 year for follow-up.  Patient knows to call sooner with any concerns.  I would like to take this opportunity to thank you for allowing me to participate in the care of your patient.Noreene Filbert, MD

## 2020-04-22 ENCOUNTER — Ambulatory Visit: Payer: Self-pay

## 2020-04-22 NOTE — Chronic Care Management (AMB) (Signed)
  Chronic Care Management   Note  04/22/2020 Name: Ernest Sweeney MRN: [id number] DOB: [birth year]   [name] was referred to the chronic care management for assistance with case management and care coordination. He anticipates being available for outreach on 04/26/20. Contact information provided. Agreed to call if needed prior to the scheduled outreach.    PLAN A member of the care management team will contact [name] on 04/26/20.    [facility] Care Management [facility] [phone]

## 2020-04-26 ENCOUNTER — Ambulatory Visit (INDEPENDENT_AMBULATORY_CARE_PROVIDER_SITE_OTHER): Payer: Medicare HMO

## 2020-04-26 DIAGNOSIS — I2510 Atherosclerotic heart disease of native coronary artery without angina pectoris: Secondary | ICD-10-CM

## 2020-04-26 DIAGNOSIS — E785 Hyperlipidemia, unspecified: Secondary | ICD-10-CM

## 2020-04-26 DIAGNOSIS — I2584 Coronary atherosclerosis due to calcified coronary lesion: Secondary | ICD-10-CM | POA: Diagnosis not present

## 2020-04-26 NOTE — Chronic Care Management (AMB) (Signed)
Chronic Care Management   Initial Visit Note  04/26/2020 Name: Ernest Sweeney MRN: [id number] DOB: Jul 09, 1951  Primary Care Provider: [name], MD Reason for referral : Chronic Care Management   Ernest Sweeney is a 69 y.o. year old male who is a primary care patient of [name], MD. The CCM team was consulted for assistance with chronic disease management and care coordination. A telephonic assessment was conducted today.  Review of Mr. Favor's status, including review of consultants reports, relevant labs and test results was conducted today. Collaboration with appropriate care team members was performed as part of the comprehensive evaluation and provision of chronic care management services.     SDOH (Social Determinants of Health) assessments performed: Yes  SDOH Interventions     Most Recent Value  SDOH Interventions  Food Insecurity Interventions Other (Comment)  [Care Guide Referral]  Financial Strain Interventions Other (Comment)  [Care Guide Referral]  Stress Interventions Intervention Not Indicated  Transportation Interventions Intervention Not Indicated       Medications: Outpatient Encounter Medications as of 04/26/2020  Medication Sig  . aspirin 81 MG tablet Take 81 mg by mouth daily.  . brimonidine-timolol (COMBIGAN) 0.2-0.5 % ophthalmic solution Place 1 drop into both eyes every 12 (twelve) hours.  . Cholecalciferol (VITAMIN D) 2000 units CAPS Take 1 capsule (2,000 Units total) by mouth daily.  . fluticasone (FLONASE) 50 MCG/ACT nasal spray Place 2 sprays into both nostrils daily.  Marland Kitchen latanoprost (XALATAN) 0.005 % ophthalmic solution Place 1 drop into both eyes at bedtime.   Marland Kitchen lisinopril (ZESTRIL) 10 MG tablet Take 1-2 tablets (10-20 mg total) by mouth 2 (two) times daily. 20 mg in am and one in pm  . rosuvastatin (CRESTOR) 40 MG tablet Take 1 tablet (40 mg total) by mouth daily. In place of Atorvastatin  . sildenafil (VIAGRA) 100 MG tablet Take 1  tablet (100 mg total) by mouth daily as needed for erectile dysfunction.  . traZODone (DESYREL) 100 MG tablet Take 1 tablet (100 mg total) by mouth at bedtime.  . triamcinolone cream (KENALOG) 0.1 % APPLY  CREAM EXTERNALLY TWICE DAILY   No facility-administered encounter medications on file as of 04/26/2020.     Objective:  BP Readings from Last 3 Encounters:  04/11/20 (!) 128/95  02/27/20 112/82  01/05/20 112/87    Lab Results  Component Value Date   CHOL 162 06/15/2019   HDL 68 06/15/2019   LDLCALC 80 06/15/2019   TRIG 55 06/15/2019   CHOLHDL 2.4 06/15/2019     Goals Addressed            This Visit's Progress   . Chronic Care Management       CARE PLAN ENTRY (see longitudinal plan of care for additional care plan information)  Current Barriers:  . Chronic Disease Management support and education needs related to Coronary Artery Disease, Chronic Bronchitis and Hyperlipidemia (Hx of Prostate Cancer)  Case Manager Clinical Goal(s):  Over the next 120 days, patient will: . Not require hospitalization or emergent care d/t complications r/t chronic illnesses. . Attend all scheduled medical appointments . Take all medications as prescribed. . Follow recommended safety precautions to prevent falls and injuries. Over the next 60 days, patient will: . Follow up with care management team regarding changes or immediate care needs. Marland Kitchen Update team if medication assistance is needed.   Interventions:  . Inter-disciplinary care team collaboration (see longitudinal plan of care) . Reviewed medications and discussed indications for use. Instructed  to take all medications as prescribed. Encouraged to notify provider if unable to tolerate prescribed regimen. Encouraged to notify care management team with concerns regarding medication management or prescription costs. Reports taking medications as prescribed. Discussed current costs of his Combigan eyedrops. Reports his Ophthalmologist  is attempting to provide samples when available. Discussed possible need for medication assistance if he remains on this medication. He agreed to update team.  . Discussed current nutrition and importance of adhering to a cardiac prudent/heart healthy diet.  Discussed nutritional needs. Reports attempting to adhere to the recommended diet but may need assistance with food resources. Agreed to outreach from a [place] to discuss available resources.  . Provided information regarding safety and fall prevention. Encouraged to use caution when ambulating. Encouraged to keep pathways clear and well lit to prevent accidental falls and injuries.   . Reviewed pending appointments. Encouraged to attend scheduled medical appointments to prevent delays in care. Encouraged to notify care management team with concerns regarding transportation.  . Discussed plans for ongoing care management and follow up. Provided direct contact information for CCM Nurse Case Manager. Reports doing very well today and remains independent in the home. We discussed need for additional assistance with nutritional resources and utilities. Care Guide referral placed.    Patient Self Care Activities:  . Self administers medications  . Attends scheduled provider appointments . Performs ADL's independently . Performs IADL's independently   Initial goal documentation         Mr. Spiller was given information about Chronic Care Management services including:  1. CCM service includes personalized support from designated clinical staff supervised by his physician, including individualized plan of care and coordination with other care providers 2. 24/7 contact phone numbers for assistance for urgent and routine care needs. 3. Service will only be billed when office clinical staff spend 20 minutes or more in a month to coordinate care. 4. Only one practitioner may furnish and bill the service in a calendar month. 5. The patient  may stop CCM services at any time (effective at the end of the month) by phone call to the office staff. 6. The patient will be responsible for cost sharing (co-pay) of up to 20% of the service fee (after annual deductible is met).  Patient agreed to services and verbal consent obtained.       PLAN A member of the care management team will follow up with Mr. Sebald next month.     [facility] Care [facility] [phone]

## 2020-05-01 NOTE — Patient Instructions (Addendum)
Check coverage for shingrix   Preventive Care 65 Years and Older, Male Preventive care refers to lifestyle choices and visits with your health care provider that can promote health and wellness. This includes:  A yearly physical exam. This is also called an annual well check.  Regular dental and eye exams.  Immunizations.  Screening for certain conditions.  Healthy lifestyle choices, such as diet and exercise. What can I expect for my preventive care visit? Physical exam Your health care provider will check:  Height and weight. These may be used to calculate body mass index (BMI), which is a measurement that tells if you are at a healthy weight.  Heart rate and blood pressure.  Your skin for abnormal spots. Counseling Your health care provider may ask you questions about:  Alcohol, tobacco, and drug use.  Emotional well-being.  Home and relationship well-being.  Sexual activity.  Eating habits.  History of falls.  Memory and ability to understand (cognition).  Work and work Astronomer. What immunizations do I need?  Influenza (flu) vaccine  This is recommended every year. Tetanus, diphtheria, and pertussis (Tdap) vaccine  You may need a Td booster every 10 years. Varicella (chickenpox) vaccine  You may need this vaccine if you have not already been vaccinated. Zoster (shingles) vaccine  You may need this after age 32. Pneumococcal conjugate (PCV13) vaccine  One dose is recommended after age 11. Pneumococcal polysaccharide (PPSV23) vaccine  One dose is recommended after age 46. Measles, mumps, and rubella (MMR) vaccine  You may need at least one dose of MMR if you were born in 1957 or later. You may also need a second dose. Meningococcal conjugate (MenACWY) vaccine  You may need this if you have certain conditions. Hepatitis A vaccine  You may need this if you have certain conditions or if you travel or work in places where you may be exposed to  hepatitis A. Hepatitis B vaccine  You may need this if you have certain conditions or if you travel or work in places where you may be exposed to hepatitis B. Haemophilus influenzae type b (Hib) vaccine  You may need this if you have certain conditions. You may receive vaccines as individual doses or as more than one vaccine together in one shot (combination vaccines). Talk with your health care provider about the risks and benefits of combination vaccines. What tests do I need? Blood tests  Lipid and cholesterol levels. These may be checked every 5 years, or more frequently depending on your overall health.  Hepatitis C test.  Hepatitis B test. Screening  Lung cancer screening. You may have this screening every year starting at age 61 if you have a 30-pack-year history of smoking and currently smoke or have quit within the past 15 years.  Colorectal cancer screening. All adults should have this screening starting at age 44 and continuing until age 70. Your health care provider may recommend screening at age 66 if you are at increased risk. You will have tests every 1-10 years, depending on your results and the type of screening test.  Prostate cancer screening. Recommendations will vary depending on your family history and other risks.  Diabetes screening. This is done by checking your blood sugar (glucose) after you have not eaten for a while (fasting). You may have this done every 1-3 years.  Abdominal aortic aneurysm (AAA) screening. You may need this if you are a current or former smoker.  Sexually transmitted disease (STD) testing. Follow these instructions at  home: Eating and drinking  Eat a diet that includes fresh fruits and vegetables, whole grains, lean protein, and low-fat dairy products. Limit your intake of foods with high amounts of sugar, saturated fats, and salt.  Take vitamin and mineral supplements as recommended by your health care provider.  Do not drink  alcohol if your health care provider tells you not to drink.  If you drink alcohol: ? Limit how much you have to 0-2 drinks a day. ? Be aware of how much alcohol is in your drink. In the [place] one drink equals one 12 oz bottle of beer (355 mL), one 5 oz glass of wine (148 mL), or one 1 oz glass of hard liquor (44 mL). Lifestyle  Take daily care of your teeth and gums.  Stay active. Exercise for at least 30 minutes on 5 or more days each week.  Do not use any products that contain nicotine or tobacco, such as cigarettes, e-cigarettes, and chewing tobacco. If you need help quitting, ask your health care provider.  If you are sexually active, practice safe sex. Use a condom or other form of protection to prevent STIs (sexually transmitted infections).  Talk with your health care provider about taking a low-dose aspirin or statin. What's next?  Visit your health care provider once a year for a well check visit.  Ask your health care provider how often you should have your eyes and teeth checked.  Stay up to date on all vaccines. This information is not intended to replace advice given to you by your health care provider. Make sure you discuss any questions you have with your health care provider. Document Revised: 06/23/2018 Document Reviewed: 06/23/2018 Elsevier Patient Education  2020 [employer].

## 2020-05-01 NOTE — Progress Notes (Signed)
Name: Ernest Sweeney   MRN: [id number]    DOB: March 04, 1951   Date:05/02/2020       Progress Note  Subjective  Chief Complaint  Annual Exam   HPI  Patient presents for annual CPE   IPSS Questionnaire (AUA-7): Over the past month   1)  How often have you had a sensation of not emptying your bladder completely after you finish urinating?  1 - Less than 1 time in 5  2)  How often have you had to urinate again less than two hours after you finished urinating? 1 - Less than 1 time in 5  3)  How often have you found you stopped and started again several times when you urinated?  0 - Not at all  4) How difficult have you found it to postpone urination?  0 - Not at all  5) How often have you had a weak urinary stream?  0 - Not at all  6) How often have you had to push or strain to begin urination?  0 - Not at all  7) How many times did you most typically get up to urinate from the time you went to bed until the time you got up in the morning?  1 - 1 time  Total score:  0-7 mildly symptomatic   8-19 moderately symptomatic   20-35 severely symptomatic     Diet: " I am eating out a lot", usually breakfast and lunch. Discussed balanced diet  Exercise: walking 1 miles most mornings   Depression: phq 9 is negative - states bored with COVID  Depression screen [facility] 2/9 05/02/2020 04/26/2020 02/27/2020 12/29/2019 10/20/2019  Decreased Interest 1 0 0 0 0  Down, Depressed, Hopeless 0 1 0 0 0  PHQ - 2 Score 1 1 0 0 0  Altered sleeping 0 - - 0 0  Tired, decreased energy 0 - - 0 0  Change in appetite 0 - - 0 0  Feeling bad or failure about yourself  0 - - 0 0  Trouble concentrating 0 - - 0 0  Moving slowly or fidgety/restless 0 - - 0 0  Suicidal thoughts 0 - - 0 0  PHQ-9 Score 1 - - 0 0  Difficult doing work/chores Not difficult at all - - - Not difficult at all  Some recent data might be hidden    Hypertension:  BP Readings from Last 3 Encounters:  05/02/20 110/80  04/11/20 (!) 128/95   02/27/20 112/82    Obesity: Wt Readings from Last 3 Encounters:  05/02/20 180 lb (81.6 kg)  04/11/20 180 lb 4 oz (81.8 kg)  02/27/20 181 lb 9.6 oz (82.4 kg)   BMI Readings from Last 3 Encounters:  05/02/20 26.58 kg/m  04/11/20 26.62 kg/m  02/27/20 26.82 kg/m     Lipids:  Lab Results  Component Value Date   CHOL 162 06/15/2019   CHOL 185 11/28/2018   CHOL 167 01/05/2018   Lab Results  Component Value Date   HDL 68 06/15/2019   HDL 76 11/28/2018   HDL 73 01/05/2018   Lab Results  Component Value Date   LDLCALC 80 06/15/2019   LDLCALC 92 11/28/2018   LDLCALC 80 01/05/2018   Lab Results  Component Value Date   TRIG 55 06/15/2019   TRIG 80 11/28/2018   TRIG 67 01/05/2018   Lab Results  Component Value Date   CHOLHDL 2.4 06/15/2019   CHOLHDL 2.4 11/28/2018   CHOLHDL 2.3 01/05/2018  No results found for: LDLDIRECT Glucose:  Glucose, Bld  Date Value Ref Range Status  06/15/2019 86 65 - 99 mg/dL Final    Comment:    .            Fasting reference interval .   11/28/2018 93 65 - 99 mg/dL Final    Comment:    .            Fasting reference interval .   01/05/2018 91 65 - 99 mg/dL Final    Comment:    .            Fasting reference interval .       Chronic Care Management from 04/26/2020 in [facility]  AUDIT-C Score 2       Widowed STD testing and prevention (HIV/chl/gon/syphilis): not interested today  Hep C: 03/2012  Skin cancer: Discussed monitoring for atypical lesions Colorectal cancer: completed 01/05/20 Prostate cancer: under the care of Urologist   Lab Results  Component Value Date   PSA 5.99 (H) 02/05/2016   PSA 2.6 08/24/2014     Lung cancer:  Completed on 09/21/19.   AAA:  Normal screen in 2018 ECG:  06/02/2019  Vaccines:  Shingrix: 28-64 yo and ask insurance if covered when patient above 21 yo Pneumonia:  educated and discussed with patient. Flu:  educated and discussed with patient.  Advanced  Care Planning: A voluntary discussion about advance care planning including the explanation and discussion of advance directives.  Discussed health care proxy and Living will, and the patient was able to identify a health care proxy as sister - Sylvie Farrier  .   Patient Active Problem List   Diagnosis Date Noted   Lung nodules 09/04/2018   Chronic bronchitis ([place]) 04/23/2017   Atherosclerosis of aorta ([place]) 04/23/2017   Coronary artery disease due to calcified coronary lesion 04/23/2017   Glaucoma, left eye 08/04/2016   Prostate cancer ([place]) 07/15/2016   History of prostatectomy 07/15/2016   Vitamin D deficiency 05/31/2015   Hyperlipidemia LDL goal <100 05/31/2015   Seasonal allergic rhinitis 05/31/2015   Chronic pain of both shoulders 05/31/2015   Family history of malignant neoplasm of prostate 07/26/2012   Hematuria, microscopic 07/26/2012    Past Surgical History:  Procedure Laterality Date   COLONOSCOPY  2010   Dr Sula Rumple   COLONOSCOPY WITH PROPOFOL N/A 01/05/2020   Procedure: COLONOSCOPY WITH PROPOFOL;  Surgeon: [name], MD;  Location: [facility] ENDOSCOPY;  Service: Gastroenterology;  Laterality: N/A;   excision skin cyst  06-19-14   Dr. Festus Aloe CYST   PELVIC LYMPH NODE DISSECTION N/A 07/15/2016   Procedure: PELVIC LYMPH NODE DISSECTION;  Surgeon: Nickie Retort, MD;  Location: [facility] ORS;  Service: Urology;  Laterality: N/A;   ROBOT ASSISTED LAPAROSCOPIC RADICAL PROSTATECTOMY N/A 07/15/2016   Procedure: ROBOTIC ASSISTED LAPAROSCOPIC RADICAL PROSTATECTOMY;  Surgeon: Nickie Retort, MD;  Location: [facility] ORS;  Service: Urology;  Laterality: N/A;    Family History  Problem Relation Age of Onset   Heart disease Mother    Heart attack Mother    Diabetes Father    Heart attack Father    Dementia Father    Cancer Sister        Breast   Cancer Brother        Prostate   Healthy Sister    Stroke Brother     Social History   Socioeconomic  History   Marital status: Widowed    Spouse name: Not  on file   Number of children: 0   Years of education: Not on file   Highest education level: 12th grade  Occupational History   Occupation: custodian     Comment: part time   Tobacco Use   Smoking status: Former Smoker    Packs/day: 0.50    Years: 40.00    Pack years: 20.00    Types: Cigarettes    Quit date: 03/23/2016    Years since quitting: 4.1   Smokeless tobacco: Never Used   Tobacco comment: to stay quit   Vaping Use   Vaping Use: Never used  Substance and Sexual Activity   Alcohol use: Yes    Alcohol/week: 2.0 - 4.0 standard drinks    Types: 2 - 4 Standard drinks or equivalent per week    Comment: 1-2 times per week   Drug use: No   Sexual activity: Not Currently    Birth control/protection: Condom  Other Topics Concern   Not on file  Social History Narrative   Not on file   Social Determinants of Health   Financial Resource Strain: Medium Risk   Difficulty of Paying Living Expenses: Somewhat hard  Food Insecurity: Food Insecurity Present   Worried About Charity fundraiser in the Last Year: Sometimes true   Ran Out of Food in the Last Year: Never true  Transportation Needs: No Transportation Needs   Lack of Transportation (Medical): No   Lack of Transportation (Non-Medical): No  Physical Activity: Sufficiently Active   Days of Exercise per Week: 5 days   Minutes of Exercise per Session: 30 min  Stress: No Stress Concern Present   Feeling of Stress : Not at all  Social Connections: Socially Isolated   Frequency of Communication with Friends and Family: More than three times a week   Frequency of Social Gatherings with Friends and Family: Once a week   Attends Religious Services: Never   Marine scientist or Organizations: No   Attends Archivist Meetings: Never   Marital Status: Widowed  Human resources officer Violence: Not At Risk   Fear of Current or Ex-Partner:  No   Emotionally Abused: No   Physically Abused: No   Sexually Abused: No     Current Outpatient Medications:    aspirin 81 MG tablet, Take 81 mg by mouth daily., Disp: , Rfl:    brimonidine-timolol (COMBIGAN) 0.2-0.5 % ophthalmic solution, Place 1 drop into both eyes every 12 (twelve) hours., Disp: , Rfl:    Cholecalciferol (VITAMIN D) 2000 units CAPS, Take 1 capsule (2,000 Units total) by mouth daily., Disp: 30 capsule, Rfl: 0   fluticasone (FLONASE) 50 MCG/ACT nasal spray, Place 2 sprays into both nostrils daily., Disp: 16 g, Rfl: 6   latanoprost (XALATAN) 0.005 % ophthalmic solution, Place 1 drop into both eyes at bedtime. , Disp: , Rfl:    lisinopril (ZESTRIL) 10 MG tablet, Take 1-2 tablets (10-20 mg total) by mouth 2 (two) times daily. 20 mg in am and one in pm, Disp: 270 tablet, Rfl: 1   rosuvastatin (CRESTOR) 40 MG tablet, Take 1 tablet (40 mg total) by mouth daily. In place of Atorvastatin, Disp: 90 tablet, Rfl: 1   sildenafil (VIAGRA) 100 MG tablet, Take 1 tablet (100 mg total) by mouth daily as needed for erectile dysfunction., Disp: 30 tablet, Rfl: 0   triamcinolone cream (KENALOG) 0.1 %, APPLY  CREAM EXTERNALLY TWICE DAILY, Disp: 45 g, Rfl: 0   traZODone (DESYREL) 100 MG tablet,  Take 1 tablet (100 mg total) by mouth at bedtime. (Patient not taking: Reported on 05/02/2020), Disp: 90 tablet, Rfl: 0  No Known Allergies   ROS  Constitutional: Negative for fever or weight change.  Respiratory: Negative for cough and shortness of breath.   Cardiovascular: Negative for chest pain or palpitations.  Gastrointestinal: Negative for abdominal pain, no bowel changes.  Musculoskeletal: Negative for gait problem or joint swelling.  Skin: Negative for rash.  Neurological: Negative for dizziness or headache.  No other specific complaints in a complete review of systems (except as listed in HPI above).  Objective  Vitals:   05/02/20 0943  BP: 110/80  Pulse: 89  Resp: 18   Temp: 98.2 F (36.8 C)  SpO2: 99%  Weight: 180 lb (81.6 kg)  Height: 5\' 9"  (1.753 m)    Body mass index is 26.58 kg/m.  Physical Exam  Constitutional: Patient appears well-developed and well-nourished. No distress.  HENT: Head: Normocephalic and atraumatic. Ears: B TMs ok, no erythema or effusion; Nose: Not done l. Mouth/Throat:not done  Eyes: Conjunctivae and EOM are normal. Pupils are equal, round, and reactive to light. No scleral icterus.  Neck: Normal range of motion. Neck supple. No JVD present. No thyromegaly present.  Cardiovascular: Normal rate, regular rhythm and normal heart sounds.  No murmur heard. No BLE edema. Pulmonary/Chest: Effort normal and breath sounds normal. No respiratory distress. Abdominal: Soft. Bowel sounds are normal, no distension. There is no tenderness. no masses MALE GENITALIA: not done  RECTAL: not done  Musculoskeletal: Normal range of motion, no joint effusions. No gross deformities Neurological: he is alert and oriented to person, place, and time. No cranial nerve deficit. Coordination, balance, strength, speech and gait are normal.  Skin: Skin is warm and dry. No rash noted. No erythema.  Psychiatric: Patient has a normal mood and affect. behavior is normal. Judgment and thought content normal.  Recent Results (from the past 2160 hour(s))  PSA     Status: None   Collection Time: 04/04/20 10:08 AM  Result Value Ref Range   Prostatic Specific Antigen <0.01 0.00 - 4.00 ng/mL    Comment: Performed at [facility] [address], [place], [place] [address]     Fall Risk: Fall Risk  05/02/2020 04/26/2020 02/27/2020 12/29/2019 10/20/2019  Falls in the past year? 0 0 0 0 0  Number falls in past yr: 0 - 0 0 0  Injury with Fall? 0 - 0 0 0  Risk for fall due to : - Medication side effect No Fall Risks - -  Risk for fall due to: Comment - - - - -  Follow up - Falls prevention discussed Falls prevention discussed - Falls evaluation completed     Functional Status Survey: Is the patient deaf or have difficulty hearing?: No Does the patient have difficulty seeing, even when wearing glasses/contacts?: No Does the patient have difficulty concentrating, remembering, or making decisions?: No Does the patient have difficulty walking or climbing stairs?: No Does the patient have difficulty dressing or bathing?: No Does the patient have difficulty doing errands alone such as visiting a doctor's office or shopping?: No    Assessment & Plan  1. Well adult exam  - Lipid panel - CBC with Differential/Platelet - COMPLETE METABOLIC PANEL WITH GFR - Hemoglobin A1c - VITAMIN D 25 Hydroxy (Vit-D Deficiency, Fractures)  2. Need for immunization against influenza  - Flu Vaccine QUAD High Dose(Fluad)  3. Other neutropenia (HCC)  - CBC  with Differential/Platelet  4. Essential hypertension  - COMPLETE METABOLIC PANEL WITH GFR  5. Vitamin D deficiency  - VITAMIN D 25 Hydroxy (Vit-D Deficiency, Fractures)  6. Hyperglycemia  - Hemoglobin A1c  7. Atherosclerosis of aorta (HCC)  - Lipid panel  -Prostate cancer screening and PSA options (with potential risks and benefits of testing vs not testing) were discussed along with recent recs/guidelines. -USPSTF grade A and B recommendations reviewed with patient; age-appropriate recommendations, preventive care, screening tests, etc discussed and encouraged; healthy living encouraged; see AVS for patient education given to patient -Discussed importance of 150 minutes of physical activity weekly, eat two servings of fish weekly, eat one serving of tree nuts ( cashews, pistachios, pecans, almonds.Marland Kitchen) every other day, eat 6 servings of fruit/vegetables daily and drink plenty of water and avoid sweet beverages.

## 2020-05-02 ENCOUNTER — Other Ambulatory Visit: Payer: Self-pay

## 2020-05-02 ENCOUNTER — Ambulatory Visit (INDEPENDENT_AMBULATORY_CARE_PROVIDER_SITE_OTHER): Payer: Medicare HMO | Admitting: Family Medicine

## 2020-05-02 ENCOUNTER — Encounter: Payer: Self-pay | Admitting: Family Medicine

## 2020-05-02 VITALS — BP 110/80 | HR 89 | Temp 98.2°F | Resp 18 | Ht 69.0 in | Wt 180.0 lb

## 2020-05-02 DIAGNOSIS — E559 Vitamin D deficiency, unspecified: Secondary | ICD-10-CM

## 2020-05-02 DIAGNOSIS — I10 Essential (primary) hypertension: Secondary | ICD-10-CM

## 2020-05-02 DIAGNOSIS — Z23 Encounter for immunization: Secondary | ICD-10-CM | POA: Diagnosis not present

## 2020-05-02 DIAGNOSIS — R739 Hyperglycemia, unspecified: Secondary | ICD-10-CM

## 2020-05-02 DIAGNOSIS — I700 Atherosclerosis of aorta: Secondary | ICD-10-CM

## 2020-05-02 DIAGNOSIS — D708 Other neutropenia: Secondary | ICD-10-CM

## 2020-05-02 DIAGNOSIS — Z0000 Encounter for general adult medical examination without abnormal findings: Secondary | ICD-10-CM

## 2020-05-03 LAB — HEMOGLOBIN A1C
Hgb A1c MFr Bld: 5.4 % of total Hgb (ref <5.7)
Mean Plasma Glucose: 108 (calc)
eAG (mmol/L): 6 (calc)

## 2020-05-03 LAB — CBC WITH DIFFERENTIAL/PLATELET
Absolute Monocytes: 299 cells/uL (ref 200–950)
Basophils Absolute: 9 cells/uL (ref 0–200)
Basophils Relative: 0.4 %
Eosinophils Absolute: 60 cells/uL (ref 15–500)
Eosinophils Relative: 2.6 %
HCT: 44.7 % (ref 38.5–50.0)
Hemoglobin: 15 g/dL (ref 13.2–17.1)
Lymphs Abs: 796 cells/uL — ABNORMAL LOW (ref 850–3900)
MCH: 33.1 pg — ABNORMAL HIGH (ref 27.0–33.0)
MCHC: 33.6 g/dL (ref 32.0–36.0)
MCV: 98.7 fL (ref 80.0–100.0)
MPV: 9.6 fL (ref 7.5–12.5)
Monocytes Relative: 13 %
Neutro Abs: 1136 cells/uL — ABNORMAL LOW (ref 1500–7800)
Neutrophils Relative %: 49.4 %
Platelets: 302 10*3/uL (ref 140–400)
RBC: 4.53 10*6/uL (ref 4.20–5.80)
RDW: 12.1 % (ref 11.0–15.0)
Total Lymphocyte: 34.6 %
WBC: 2.3 10*3/uL — ABNORMAL LOW (ref 3.8–10.8)

## 2020-05-03 LAB — COMPLETE METABOLIC PANEL WITH GFR
AG Ratio: 2 (calc) (ref 1.0–2.5)
ALT: 14 U/L (ref 9–46)
AST: 14 U/L (ref 10–35)
Albumin: 4.6 g/dL (ref 3.6–5.1)
Alkaline phosphatase (APISO): 47 U/L (ref 35–144)
BUN: 14 mg/dL (ref 7–25)
CO2: 31 mmol/L (ref 20–32)
Calcium: 10.2 mg/dL (ref 8.6–10.3)
Chloride: 102 mmol/L (ref 98–110)
Creat: 1.16 mg/dL (ref 0.70–1.25)
GFR, Est African American: 74 mL/min/{1.73_m2} (ref >=60)
GFR, Est Non African American: 64 mL/min/{1.73_m2} (ref >=60)
Globulin: 2.3 g/dL (calc) (ref 1.9–3.7)
Glucose, Bld: 89 mg/dL (ref 65–99)
Potassium: 4.8 mmol/L (ref 3.5–5.3)
Sodium: 139 mmol/L (ref 135–146)
Total Bilirubin: 1.3 mg/dL — ABNORMAL HIGH (ref 0.2–1.2)
Total Protein: 6.9 g/dL (ref 6.1–8.1)

## 2020-05-03 LAB — LIPID PANEL
Cholesterol: 150 mg/dL (ref <200)
HDL: 72 mg/dL (ref >=40)
LDL Cholesterol (Calc): 64 mg/dL (calc)
Non-HDL Cholesterol (Calc): 78 mg/dL (calc) (ref <130)
Total CHOL/HDL Ratio: 2.1 (calc) (ref <5.0)
Triglycerides: 60 mg/dL (ref <150)

## 2020-05-03 LAB — VITAMIN D 25 HYDROXY (VIT D DEFICIENCY, FRACTURES): Vit D, 25-Hydroxy: 34 ng/mL (ref 30–100)

## 2020-05-21 NOTE — Patient Instructions (Signed)
Thank you for allowing the Chronic Care Management team to participate in your care.   Goals Addressed            This Visit's Progress    Chronic Care Management       CARE PLAN ENTRY (see longitudinal plan of care for additional care plan information)  Current Barriers:   Chronic Disease Management support and education needs related to Coronary Artery Disease, Chronic Bronchitis and Hyperlipidemia (Hx of Prostate Cancer)  Case Manager Clinical Goal(s):  Over the next 120 days, patient will:  Not require hospitalization or emergent care d/t complications r/t chronic illnesses.  Attend all scheduled medical appointments  Take all medications as prescribed.  Follow recommended safety precautions to prevent falls and injuries. Over the next 60 days, patient will:  Follow up with care management team regarding changes or immediate care needs.  Update team if medication assistance is needed.   Interventions:   Inter-disciplinary care team collaboration (see longitudinal plan of care)  Reviewed medications and discussed indications for use. Instructed to take all medications as prescribed. Encouraged to notify provider if unable to tolerate prescribed regimen. Encouraged to notify care management team with concerns regarding medication management or prescription costs. Reports taking medications as prescribed. Discussed current costs of his Combigan eyedrops. Reports his Ophthalmologist is attempting to provide samples when available. Discussed possible need for medication assistance if he remains on this medication. He agreed to update team.   Discussed current nutrition and importance of adhering to a cardiac prudent/heart healthy diet.  Discussed nutritional needs. Reports attempting to adhere to the recommended diet but may need assistance with food resources. Agreed to outreach from a [place] to discuss available resources.   Provided information regarding safety  and fall prevention. Encouraged to use caution when ambulating. Encouraged to keep pathways clear and well lit to prevent accidental falls and injuries.    Reviewed pending appointments. Encouraged to attend scheduled medical appointments to prevent delays in care. Encouraged to notify care management team with concerns regarding transportation.   Discussed plans for ongoing care management and follow up. Provided direct contact information for CCM Nurse Case Manager. Reports doing very well today and remains independent in the home. We discussed need for additional assistance with nutritional resources and utilities. Care Guide referral placed.    Patient Self Care Activities:   Self administers medications   Attends scheduled provider appointments  Performs ADL's independently  Performs IADL's independently   Initial goal documentation        Ernest Sweeney was given information about Chronic Care Management services including:  1. CCM service includes personalized support from designated clinical staff supervised by his physician, including individualized plan of care and coordination with other care providers 2. 24/7 contact phone numbers for assistance for urgent and routine care needs. 3. Service will only be billed when office clinical staff spend 20 minutes or more in a month to coordinate care. 4. Only one practitioner may furnish and bill the service in a calendar month. 5. The patient may stop CCM services at any time (effective at the end of the month) by phone call to the office staff. 6. The patient will be responsible for cost sharing (co-pay) of up to 20% of the service fee (after annual deductible is met).  Patient agreed to services and verbal consent obtained.    Ernest Sweeney verbalized understanding of the information discussed during the telephonic outreach today. Declined need for a mailed/printed copy of  the instructions.    A member of the care management team  will follow up with Ernest Sweeney next month.     [facility] Care Management [facility] [phone]

## 2020-05-28 ENCOUNTER — Telehealth: Payer: Self-pay | Admitting: Family Medicine

## 2020-05-28 NOTE — Telephone Encounter (Signed)
   SF 05/28/2020   Name: Ernest Sweeney   MRN: [id number]   DOB: Jan 30, 1951   AGE: 69 y.o.   GENDER: male   PCP [name], MD.   Spoke with patient today regarding Community Resource Referral for assistance with food resources and utility assistance. Patient stated that he is still in need of assistance. Asked patient if he is behind on any of his utility bills patient stated that currently he is not behind, but it is getting hard to pay his utility bills. Informed patient that Care Guide will send him resources for utility assistance. Also explained that some organizations will need to see a late notice in order to assist him. Patient stated understanding. Patient asked if a list of food resources in [facility] can be sent to him by mail.  Verified address and also let the patient know that Care Guide will refer him to the [facility] to get a grocery gift card. Will give patient a call once Care Guide has heard back from [facility].    [place], Care Management Phone: [phone] Email: [email or web address]

## 2020-05-30 ENCOUNTER — Encounter: Payer: Self-pay | Admitting: Family Medicine

## 2020-05-30 NOTE — Telephone Encounter (Signed)
Sent referral to [facility] for gift card for groceries. Will contact patient once Care Guide hears back from the organization.  Also sent patient list of food banks within [facility]. Sent letter to [employer] ) to print and mail letter to patient.

## 2020-05-31 NOTE — Telephone Encounter (Signed)
Received message from Martinique Woods from [facility] that she will get the [employer] gift card for Mr. Visconti on Tuesday 06/03/2020. Care Guide will give patient a call to let him know.

## 2020-06-04 ENCOUNTER — Encounter: Payer: Self-pay | Admitting: Family Medicine

## 2020-06-04 NOTE — Telephone Encounter (Signed)
   SF 06/04/2020   Name: Ernest Sweeney   MRN: [id number]   DOB: 1950/07/25   AGE: 69 y.o.   GENDER: male   PCP [name], MD.   Received e-mail from Martinique Woods stating that [employer] gift card has been placed in the mail. Also sent corrected resource letter to [name] to print and give to front office for [name] to pick up along with his gift card.   Sent e-mail to [employer] to disregard previous e-mail sent to her in error to print and mail information to patient.   Called [name] to let him know that the gift card should be arriving to the office by tomorrow 06/05/20, but he will need to give the office a call to verify the card has arrived and he should also get resource letter and a list of food banks in [facility] as well. Patient stated understanding and that he does not have any additional needs at this time.   Closing referral pending any other needs of patient.    [place], Care Management Phone: [phone] Email: [email or web address]

## 2020-06-11 ENCOUNTER — Telehealth: Payer: Self-pay | Admitting: Family Medicine

## 2020-06-11 NOTE — Telephone Encounter (Signed)
° °  SF 06/11/2020    Name: Ernest Sweeney    MRN: [id number]    DOB: 04-19-1951    AGE: 69 y.o.    GENDER: male    PCP [name], MD.   Received call from Mr. Police that he did not receive the [employer] gift card from [facility] that was supposed to be sent via inter office mail. Care Guide called front office and spoke with staff, but they stated that the card has not been received. Sent message to Martinique Woods, Geophysical data processor for [facility] to let her know that the gift card was not received. Let patient know that Care Guide is looking into the matter and will give him a call once it has been resolved. Patient stated understanding.    [place], Care Management Phone: [phone] Email: [email or web address]

## 2020-06-13 NOTE — Telephone Encounter (Signed)
Received e-mail from Martinique Woods from [place] that gift card has been located and is being sent to [place] office. Called Mr. Derossett to let him know that his [place] gift card has been found and that it is being sent to the [place] office via inter-office mail. Informed patient that he may want to check with the office on Monday, 06/17/20 to make sure the office receives his gift card. Patient stated understanding.

## 2020-06-14 NOTE — Telephone Encounter (Signed)
The gift card for Ernest Sweeney was mailed to [facility], but the manager there came by our office on today to deliver to [place].  I just spoke with patient and informed him that he may come by our office on today to pick up at our front desk.  Patient verbalized understanding.

## 2020-06-14 NOTE — Telephone Encounter (Signed)
Patient has now picked up his [place] gift card.

## 2020-06-27 NOTE — Progress Notes (Signed)
Name: Ernest Sweeney   MRN: [id number]    DOB: [birth year]   Date:06/28/2020       Progress Note  Subjective  Chief Complaint  Follow Up  HPI  Leucopenia: we will continue to monitor it yearly   Depression: seen January 2020 and phq 9 was very high at 51, we started him on Zofot 50 mgbut he  stopped Zofot because he read the side effects, he has been off medication for a while now and pqh 9 is negative, he states exercising has helped with his symptoms. He is doing well at this time   Alcohol use disorder: he always drank, used to beer when he was younger, but since his mid 20's he started to drink liquor, since his 23's he has been drinking heavier, mostly on weekends and worse since COVID-19, drinking a pint and a half of liquor on weekends and some beer, he feels bad about it and would like to quit. He states he is back down to half a  pint of liquor on weekends, feeling better emotionally , going for walks, but not ready to quit yet, but getting closer   FYB:OFBPZW 20 mg lisinopril in am and 10 mg at night ( cannot combine or gets dizzy) and is tolerating well, denies orthostatic changes, chest pain or palpitation   Chronic bronchitis/Emphysema: he used Breo in the past but he stopped medication on his own, quit smoking in 2016and is gradually getting better.  No sob or wheezing. He has occasional cough that is usually dry   Hyperlipidemia/Atherosclerosis of Aorta: taking aspirin and rosuvastatin.Last LDL done 04/2020 down from 80 to 64  He has atherosclerosis of coronaries but not chest pain ro palpitation, he was seen by Dr. Clayborn Bigness , negative stress test done 07/10/2019 , he denies myalgia   Insomnia:he states no longer taking medication, he states he has been sleeping over 6 hours per night without medication and wakes up feeling rested   History of prostate cancer dx 2016 : last PSA was normal seeing Dr. Baruch Gouty every 12  months now.He states nocturia resolved,  sometimes has urinary urgency but much better   Patient Active Problem List   Diagnosis Date Noted  . Lung nodules 09/04/2018  . Chronic bronchitis ([place]) 04/23/2017  . Atherosclerosis of aorta ([place]) 04/23/2017  . Coronary artery disease due to calcified coronary lesion 04/23/2017  . Glaucoma, left eye 08/04/2016  . Prostate cancer ([place]) 07/15/2016  . History of prostatectomy 07/15/2016  . Vitamin D deficiency 05/31/2015  . Hyperlipidemia LDL goal <100 05/31/2015  . Seasonal allergic rhinitis 05/31/2015  . Chronic pain of both shoulders 05/31/2015  . Family history of malignant neoplasm of prostate 07/26/2012  . Hematuria, microscopic 07/26/2012    Past Surgical History:  Procedure Laterality Date  . COLONOSCOPY  2010   Dr Sula Rumple  . COLONOSCOPY WITH PROPOFOL N/A 01/05/2020   Procedure: COLONOSCOPY WITH PROPOFOL;  Surgeon: [name], MD;  Location: [facility] ENDOSCOPY;  Service: Gastroenterology;  Laterality: N/A;  . excision skin cyst  06-19-14   Dr. Festus Aloe CYST  . PELVIC LYMPH NODE DISSECTION N/A 07/15/2016   Procedure: PELVIC LYMPH NODE DISSECTION;  Surgeon: Nickie Retort, MD;  Location: [facility] ORS;  Service: Urology;  Laterality: N/A;  . ROBOT ASSISTED LAPAROSCOPIC RADICAL PROSTATECTOMY N/A 07/15/2016   Procedure: ROBOTIC ASSISTED LAPAROSCOPIC RADICAL PROSTATECTOMY;  Surgeon: Nickie Retort, MD;  Location: [facility] ORS;  Service: Urology;  Laterality: N/A;    Family History  Problem Relation Age  of Onset  . Heart disease Mother   . Heart attack Mother   . Diabetes Father   . Heart attack Father   . Dementia Father   . Cancer Sister        Breast  . Cancer Brother        Prostate  . Healthy Sister   . Stroke Brother     Social History   Tobacco Use  . Smoking status: Former Smoker    Packs/day: 0.50    Years: 40.00    Pack years: 20.00    Types: Cigarettes    Quit date: 03/23/2016    Years since quitting: 4.2  . Smokeless tobacco: Never Used  . Tobacco  comment: to stay quit   Substance Use Topics  . Alcohol use: Yes    Alcohol/week: 2.0 - 4.0 standard drinks    Types: 2 - 4 Standard drinks or equivalent per week    Comment: 1-2 times per week     Current Outpatient Medications:  .  aspirin 81 MG tablet, Take 81 mg by mouth daily., Disp: , Rfl:  .  brimonidine-timolol (COMBIGAN) 0.2-0.5 % ophthalmic solution, Place 1 drop into both eyes every 12 (twelve) hours., Disp: , Rfl:  .  Cholecalciferol (VITAMIN D) 2000 units CAPS, Take 1 capsule (2,000 Units total) by mouth daily., Disp: 30 capsule, Rfl: 0 .  fluticasone (FLONASE) 50 MCG/ACT nasal spray, Place 2 sprays into both nostrils daily., Disp: 16 g, Rfl: 6 .  latanoprost (XALATAN) 0.005 % ophthalmic solution, Place 1 drop into both eyes at bedtime. , Disp: , Rfl:  .  lisinopril (ZESTRIL) 10 MG tablet, Take 1-2 tablets (10-20 mg total) by mouth 2 (two) times daily. 20 mg in am and one in pm, Disp: 270 tablet, Rfl: 1 .  rosuvastatin (CRESTOR) 40 MG tablet, Take 1 tablet (40 mg total) by mouth daily. In place of Atorvastatin, Disp: 90 tablet, Rfl: 1 .  sildenafil (VIAGRA) 100 MG tablet, Take 1 tablet (100 mg total) by mouth daily as needed for erectile dysfunction., Disp: 30 tablet, Rfl: 0 .  triamcinolone cream (KENALOG) 0.1 %, APPLY  CREAM EXTERNALLY TWICE DAILY, Disp: 45 g, Rfl: 0 .  traZODone (DESYREL) 100 MG tablet, Take 1 tablet (100 mg total) by mouth at bedtime. (Patient not taking: No sig reported), Disp: 90 tablet, Rfl: 0  No Known Allergies  I personally reviewed active problem list, medication list, allergies, family history, social history, health maintenance, notes from last encounter with the patient/caregiver today.   ROS  Constitutional: Negative for fever or weight change.  Respiratory: Negative for cough and shortness of breath.   Cardiovascular: Negative for chest pain or palpitations.  Gastrointestinal: Negative for abdominal pain, no bowel changes.   Musculoskeletal: Negative for gait problem or joint swelling.  Skin: Negative for rash.  Neurological: Negative for dizziness or headache.  No other specific complaints in a complete review of systems (except as listed in HPI above).  Objective  Vitals:   06/28/20 0937  BP: 116/78  Pulse: 90  Resp: 16  Temp: 97.7 F (36.5 C)  TempSrc: Oral  SpO2: 94%  Weight: 183 lb 14.4 oz (83.4 kg)  Height: 5\' 9"  (1.753 m)    Body mass index is 27.16 kg/m.  Physical Exam  Constitutional: Patient appears well-developed and well-nourished. Overweight.  No distress.  HEENT: head atraumatic, normocephalic, pupils equal and reactive to light,  neck supple, throat within normal limits Cardiovascular: Normal rate, regular rhythm and  normal heart sounds.  No murmur heard. No BLE edema. Pulmonary/Chest: Effort normal and breath sounds normal. No respiratory distress. Abdominal: Soft.  There is no tenderness. Psychiatric: Patient has a normal mood and affect. behavior is normal. Judgment and thought content normal.  Recent Results (from the past 2160 hour(s))  PSA     Status: None   Collection Time: 04/04/20 10:08 AM  Result Value Ref Range   Prostatic Specific Antigen <0.01 0.00 - 4.00 ng/mL    Comment: Performed at [facility] [address], [place], [place] [address]  Lipid panel     Status: None   Collection Time: 05/02/20 10:39 AM  Result Value Ref Range   Cholesterol 150 <200 mg/dL   HDL 72 > OR = 40 mg/dL   Triglycerides 60 <150 mg/dL   LDL Cholesterol (Calc) 64 mg/dL (calc)    Comment: Reference range: <100 . Desirable range <100 mg/dL for primary prevention;   <70 mg/dL for patients with CHD or diabetic patients  with > or = 2 CHD risk factors. Marland Kitchen LDL-C is now calculated using the Martin-Hopkins  calculation, which is a validated novel method providing  better accuracy than the Friedewald equation in the  estimation of LDL-C.  Cresenciano Genre et al. Annamaria Helling. 6045;409(81):  2061-2068  (http://education.[facility].com/faq/FAQ164)    Total CHOL/HDL Ratio 2.1 <5.0 (calc)   Non-HDL Cholesterol (Calc) 78 <130 mg/dL (calc)    Comment: For patients with diabetes plus 1 major ASCVD risk  factor, treating to a non-HDL-C goal of <100 mg/dL  (LDL-C of <70 mg/dL) is considered a therapeutic  option.   CBC with Differential/Platelet     Status: Abnormal   Collection Time: 05/02/20 10:39 AM  Result Value Ref Range   WBC 2.3 (L) 3.8 - 10.8 Thousand/uL   RBC 4.53 4.20 - 5.80 Million/uL   Hemoglobin 15.0 13.2 - 17.1 g/dL   HCT 44.7 38.5 - 50.0 %   MCV 98.7 80.0 - 100.0 fL   MCH 33.1 (H) 27.0 - 33.0 pg   MCHC 33.6 32.0 - 36.0 g/dL   RDW 12.1 11.0 - 15.0 %   Platelets 302 140 - 400 Thousand/uL   MPV 9.6 7.5 - 12.5 fL   Neutro Abs 1,136 (L) 1,500 - 7,800 cells/uL   Lymphs Abs 796 (L) 850 - 3,900 cells/uL   Absolute Monocytes 299 200 - 950 cells/uL   Eosinophils Absolute 60 15 - 500 cells/uL   Basophils Absolute 9 0 - 200 cells/uL   Neutrophils Relative % 49.4 %   Total Lymphocyte 34.6 %   Monocytes Relative 13.0 %   Eosinophils Relative 2.6 %   Basophils Relative 0.4 %  COMPLETE METABOLIC PANEL WITH GFR     Status: Abnormal   Collection Time: 05/02/20 10:39 AM  Result Value Ref Range   Glucose, Bld 89 65 - 99 mg/dL    Comment: .            Fasting reference interval .    BUN 14 7 - 25 mg/dL   Creat 1.16 0.70 - 1.25 mg/dL    Comment: For patients >17 years of age, the reference limit for Creatinine is approximately 13% higher for people identified as African-American. .    GFR, Est Non African American 64 > OR = 60 mL/min/1.35m2   GFR, Est African American 74 > OR = 60 mL/min/1.17m2   BUN/Creatinine Ratio NOT APPLICABLE 6 - 22 (calc)   Sodium 139 135 - 146 mmol/L  Potassium 4.8 3.5 - 5.3 mmol/L   Chloride 102 98 - 110 mmol/L   CO2 31 20 - 32 mmol/L   Calcium 10.2 8.6 - 10.3 mg/dL   Total Protein 6.9 6.1 - 8.1 g/dL   Albumin 4.6 3.6 - 5.1 g/dL    Globulin 2.3 1.9 - 3.7 g/dL (calc)   AG Ratio 2.0 1.0 - 2.5 (calc)   Total Bilirubin 1.3 (H) 0.2 - 1.2 mg/dL   Alkaline phosphatase (APISO) 47 35 - 144 U/L   AST 14 10 - 35 U/L   ALT 14 9 - 46 U/L  Hemoglobin A1c     Status: None   Collection Time: 05/02/20 10:39 AM  Result Value Ref Range   Hgb A1c MFr Bld 5.4 <5.7 % of total Hgb    Comment: For the purpose of screening for the presence of diabetes: . <5.7%       Consistent with the absence of diabetes 5.7-6.4%    Consistent with increased risk for diabetes             (prediabetes) > or =6.5%  Consistent with diabetes . This assay result is consistent with a decreased risk of diabetes. . Currently, no consensus exists regarding use of hemoglobin A1c for diagnosis of diabetes in children. . According to American Diabetes Association (ADA) guidelines, hemoglobin A1c <7.0% represents optimal control in non-pregnant diabetic patients. Different metrics may apply to specific patient populations.  Standards of Medical Care in Diabetes(ADA). .    Mean Plasma Glucose 108 (calc)   eAG (mmol/L) 6.0 (calc)  VITAMIN D 25 Hydroxy (Vit-D Deficiency, Fractures)     Status: None   Collection Time: 05/02/20 10:39 AM  Result Value Ref Range   Vit D, 25-Hydroxy 34 30 - 100 ng/mL    Comment: Vitamin D Status         25-OH Vitamin D: . Deficiency:                    <20 ng/mL Insufficiency:             20 - 29 ng/mL Optimal:                 > or = 30 ng/mL . For 25-OH Vitamin D testing on patients on  D2-supplementation and patients for whom quantitation  of D2 and D3 fractions is required, the [facility](TM) 25-OH VIT D, (D2,D3), LC/MS/MS is recommended: order  code [phone] (patients >15yrs). See Note 1 . Note 1 . For additional information, please refer to  http://education.[facility].com/faq/FAQ199  (This link is being provided for informational/ educational purposes only.)      PHQ2/9: Depression screen [facility] 2/9  06/28/2020 05/02/2020 04/26/2020 02/27/2020 12/29/2019  Decreased Interest 0 1 0 0 0  Down, Depressed, Hopeless 0 0 1 0 0  PHQ - 2 Score 0 1 1 0 0  Altered sleeping 0 0 - - 0  Tired, decreased energy 0 0 - - 0  Change in appetite 0 0 - - 0  Feeling bad or failure about yourself  0 0 - - 0  Trouble concentrating 0 0 - - 0  Moving slowly or fidgety/restless 0 0 - - 0  Suicidal thoughts 0 0 - - 0  PHQ-9 Score 0 1 - - 0  Difficult doing work/chores - Not difficult at all - - -  Some recent data might be hidden    phq 9 is negative   Fall Risk: Fall Risk  06/28/2020  05/02/2020 04/26/2020 02/27/2020 12/29/2019  Falls in the past year? 0 0 0 0 0  Number falls in past yr: 0 0 - 0 0  Injury with Fall? 0 0 - 0 0  Risk for fall due to : - - Medication side effect No Fall Risks -  Risk for fall due to: Comment - - - - -  Follow up - - Falls prevention discussed Falls prevention discussed -     Functional Status Survey: Is the patient deaf or have difficulty hearing?: No Does the patient have difficulty seeing, even when wearing glasses/contacts?: No Does the patient have difficulty concentrating, remembering, or making decisions?: No Does the patient have difficulty walking or climbing stairs?: No Does the patient have difficulty dressing or bathing?: No Does the patient have difficulty doing errands alone such as visiting a doctor's office or shopping?: No    Assessment & Plan  There are no diagnoses linked to this encounter.

## 2020-06-28 ENCOUNTER — Other Ambulatory Visit: Payer: Self-pay

## 2020-06-28 ENCOUNTER — Encounter: Payer: Self-pay | Admitting: Family Medicine

## 2020-06-28 ENCOUNTER — Ambulatory Visit (INDEPENDENT_AMBULATORY_CARE_PROVIDER_SITE_OTHER): Payer: Medicare HMO | Admitting: Family Medicine

## 2020-06-28 VITALS — BP 116/78 | HR 90 | Temp 97.7°F | Resp 16 | Ht 69.0 in | Wt 183.9 lb

## 2020-06-28 DIAGNOSIS — Z9079 Acquired absence of other genital organ(s): Secondary | ICD-10-CM

## 2020-06-28 DIAGNOSIS — F325 Major depressive disorder, single episode, in full remission: Secondary | ICD-10-CM

## 2020-06-28 DIAGNOSIS — E785 Hyperlipidemia, unspecified: Secondary | ICD-10-CM

## 2020-06-28 DIAGNOSIS — E559 Vitamin D deficiency, unspecified: Secondary | ICD-10-CM | POA: Diagnosis not present

## 2020-06-28 DIAGNOSIS — F1020 Alcohol dependence, uncomplicated: Secondary | ICD-10-CM | POA: Insufficient documentation

## 2020-06-28 DIAGNOSIS — I2584 Coronary atherosclerosis due to calcified coronary lesion: Secondary | ICD-10-CM

## 2020-06-28 DIAGNOSIS — I700 Atherosclerosis of aorta: Secondary | ICD-10-CM

## 2020-06-28 DIAGNOSIS — J432 Centrilobular emphysema: Secondary | ICD-10-CM | POA: Diagnosis not present

## 2020-06-28 DIAGNOSIS — I2510 Atherosclerotic heart disease of native coronary artery without angina pectoris: Secondary | ICD-10-CM | POA: Diagnosis not present

## 2020-06-28 DIAGNOSIS — I10 Essential (primary) hypertension: Secondary | ICD-10-CM

## 2020-06-28 DIAGNOSIS — J302 Other seasonal allergic rhinitis: Secondary | ICD-10-CM

## 2020-06-28 DIAGNOSIS — R69 Illness, unspecified: Secondary | ICD-10-CM | POA: Diagnosis not present

## 2020-06-28 MED ORDER — SILDENAFIL CITRATE 100 MG PO TABS: 100.0000 mg | ORAL_TABLET | Freq: Every day | ORAL | 0 refills | Status: DC | PRN

## 2020-06-28 MED ORDER — LISINOPRIL 10 MG PO TABS: 10.0000 mg | ORAL_TABLET | Freq: Two times a day (BID) | ORAL | 1 refills | Status: DC

## 2020-06-28 MED ORDER — ROSUVASTATIN CALCIUM 40 MG PO TABS: 40.0000 mg | ORAL_TABLET | Freq: Every day | ORAL | 1 refills | Status: DC

## 2020-06-28 MED ORDER — AZELASTINE HCL 0.1 % NA SOLN: 2.0000 | Freq: Two times a day (BID) | NASAL | 2 refills | Status: DC

## 2020-08-07 ENCOUNTER — Ambulatory Visit: Payer: Self-pay | Admitting: *Deleted

## 2020-08-07 DIAGNOSIS — H401132 Primary open-angle glaucoma, bilateral, moderate stage: Secondary | ICD-10-CM | POA: Diagnosis not present

## 2020-08-07 NOTE — Telephone Encounter (Signed)
Patient is calling to report he has swelling Left groin- it has u=increased in size and he states it is a;most golf ball size- not sore- but does feel "pulling sensation". No fever or cuts present in area  Reason for Disposition . [1] Single large node AND [2] size > 1 inch (2.5 cm) AND [3] no fever  Answer Assessment - Initial Assessment Questions 1. LOCATION: "Where is the swollen node located?" "Is the matching node on the other side of the body also swollen?"      Right side- left normal 2. SIZE: "How big is the node?" (Inches or centimeters) (or compare to common objects such as pea, bean, marble, golf ball)      Golf ball size 3. ONSET: "When did the swelling start?"     Few weeks ago 4. NECK NODES: "Is there a sore throat, runny nose or other symptoms of a cold?"      no 5. GROIN OR ARMPIT NODES: "Is there a sore, scratch, cut or painful red area on that arm or leg?"      R groin 6. FEVER: "Do you have a fever?" If Yes, ask: "What is it, how was it measured, and when did it start?"      No fever 7. CAUSE: "What do you think is causing the swollen lymph nodes?"     unsure 8. OTHER SYMPTOMS: "Do you have any other symptoms?"     no 9. PREGNANCY: "Is there any chance you are pregnant?" "When was your last menstrual period?"     n/a  Protocols used: LYMPH NODES - [facility]

## 2020-08-07 NOTE — Progress Notes (Signed)
Name: Ernest Sweeney   MRN: [id number]    DOB: 07/26/1950   Date:08/08/2020       Progress Note  Subjective  Chief Complaint  Acute: Left groin pain and swelling   HPI  Groin mass: patient states that a few weeks ago he noticed a bulging on right inguinal area, non tender, sounds like there is water inside. Improves when supine but larger when having intercourse or standing. No redness or increase in warmth. Occasionally feels slightly tender to touch. No change in bowel movements or urinary frequency. No fever or chills. Normal appetite  Patient Active Problem List   Diagnosis Date Noted  . Alcoholism ([place]) 06/28/2020  . Major depression in remission ([place]) 06/28/2020  . Lung nodules 09/04/2018  . Chronic bronchitis ([place]) 04/23/2017  . Atherosclerosis of aorta ([place]) 04/23/2017  . Coronary artery disease due to calcified coronary lesion 04/23/2017  . Glaucoma, left eye 08/04/2016  . Prostate cancer ([place]) 07/15/2016  . History of prostatectomy 07/15/2016  . Vitamin D deficiency 05/31/2015  . Dyslipidemia 05/31/2015  . Seasonal allergic rhinitis 05/31/2015  . Chronic pain of both shoulders 05/31/2015  . Family history of malignant neoplasm of prostate 07/26/2012  . Hematuria, microscopic 07/26/2012    Past Surgical History:  Procedure Laterality Date  . COLONOSCOPY  2010   Dr Sula Rumple  . COLONOSCOPY WITH PROPOFOL N/A 01/05/2020   Procedure: COLONOSCOPY WITH PROPOFOL;  Surgeon: [name], MD;  Location: [facility] ENDOSCOPY;  Service: Gastroenterology;  Laterality: N/A;  . excision skin cyst  06-19-14   Dr. Festus Aloe CYST  . PELVIC LYMPH NODE DISSECTION N/A 07/15/2016   Procedure: PELVIC LYMPH NODE DISSECTION;  Surgeon: Nickie Retort, MD;  Location: [facility] ORS;  Service: Urology;  Laterality: N/A;  . ROBOT ASSISTED LAPAROSCOPIC RADICAL PROSTATECTOMY N/A 07/15/2016   Procedure: ROBOTIC ASSISTED LAPAROSCOPIC RADICAL PROSTATECTOMY;  Surgeon: Nickie Retort, MD;  Location: [facility]  ORS;  Service: Urology;  Laterality: N/A;    Family History  Problem Relation Age of Onset  . Heart disease Mother   . Heart attack Mother   . Diabetes Father   . Heart attack Father   . Dementia Father   . Cancer Sister        Breast  . Cancer Brother        Prostate  . Healthy Sister   . Stroke Brother     Social History   Tobacco Use  . Smoking status: Former Smoker    Packs/day: 0.50    Years: 40.00    Pack years: 20.00    Types: Cigarettes    Quit date: 03/23/2016    Years since quitting: 4.3  . Smokeless tobacco: Never Used  . Tobacco comment: to stay quit   Substance Use Topics  . Alcohol use: Yes    Alcohol/week: 2.0 - 4.0 standard drinks    Types: 2 - 4 Standard drinks or equivalent per week    Comment: 1-2 times per week     Current Outpatient Medications:  .  aspirin 81 MG tablet, Take 81 mg by mouth daily., Disp: , Rfl:  .  brimonidine-timolol (COMBIGAN) 0.2-0.5 % ophthalmic solution, Place 1 drop into both eyes every 12 (twelve) hours., Disp: , Rfl:  .  Cholecalciferol (VITAMIN D) 2000 units CAPS, Take 1 capsule (2,000 Units total) by mouth daily., Disp: 30 capsule, Rfl: 0 .  latanoprost (XALATAN) 0.005 % ophthalmic solution, Place 1 drop into both eyes at bedtime. , Disp: , Rfl:  .  lisinopril (ZESTRIL) 10 MG tablet, Take 1-2 tablets (10-20 mg total) by mouth 2 (two) times daily. 20 mg in am and one in pm, Disp: 270 tablet, Rfl: 1 .  rosuvastatin (CRESTOR) 40 MG tablet, Take 1 tablet (40 mg total) by mouth daily. In place of Atorvastatin, Disp: 90 tablet, Rfl: 1 .  sildenafil (VIAGRA) 100 MG tablet, Take 1 tablet (100 mg total) by mouth daily as needed for erectile dysfunction., Disp: 30 tablet, Rfl: 0 .  triamcinolone cream (KENALOG) 0.1 %, APPLY  CREAM EXTERNALLY TWICE DAILY, Disp: 45 g, Rfl: 0 .  azelastine (ASTELIN) 0.1 % nasal spray, Place 2 sprays into both nostrils 2 (two) times daily. Use in each nostril as directed (Patient not taking: Reported on  08/08/2020), Disp: 30 mL, Rfl: 2 .  fluticasone (FLONASE) 50 MCG/ACT nasal spray, Place 2 sprays into both nostrils daily. (Patient not taking: Reported on 08/08/2020), Disp: 16 g, Rfl: 6  No Known Allergies  I personally reviewed active problem list, medication list, allergies, family history, social history with the patient/caregiver today.   ROS  Ten systems reviewed and is negative except as mentioned in HPI   Objective  Vitals:   08/08/20 1102  BP: 118/70  Pulse: 100  Resp: 17  Temp: 97.9 F (36.6 C)  TempSrc: Oral  SpO2: 99%  Weight: 184 lb 8 oz (83.7 kg)  Height: 5\' 9"  (1.753 m)    Body mass index is 27.25 kg/m.  Physical Exam  Constitutional: Patient appears well-developed and well-nourished. Overweight.  No distress.  HEENT: head atraumatic, normocephalic, pupils equal and reactive to light,  neck supple Cardiovascular: Normal rate, regular rhythm and normal heart sounds.  No murmur heard. No BLE edema. Pulmonary/Chest: Effort normal and breath sounds normal. No respiratory distress. Abdominal: Soft.  There is no tenderness.Large bulging that is reducible to touch on right inguinal area Psychiatric: Patient has a normal mood and affect. behavior is normal. Judgment and thought content normal.  PHQ2/9: Depression screen [facility] 2/9 08/08/2020 06/28/2020 05/02/2020 04/26/2020 02/27/2020  Decreased Interest 0 0 1 0 0  Down, Depressed, Hopeless 0 0 0 1 0  PHQ - 2 Score 0 0 1 1 0  Altered sleeping 0 0 0 - -  Tired, decreased energy 0 0 0 - -  Change in appetite 0 0 0 - -  Feeling bad or failure about yourself  0 0 0 - -  Trouble concentrating 0 0 0 - -  Moving slowly or fidgety/restless 0 0 0 - -  Suicidal thoughts 0 0 0 - -  PHQ-9 Score 0 0 1 - -  Difficult doing work/chores - - Not difficult at all - -  Some recent data might be hidden    phq 9 is negative  Fall Risk: Fall Risk  08/08/2020 06/28/2020 05/02/2020 04/26/2020 02/27/2020  Falls in the past year? 0 0  0 0 0  Number falls in past yr: 0 0 0 - 0  Injury with Fall? 0 0 0 - 0  Risk for fall due to : - - - Medication side effect No Fall Risks  Risk for fall due to: Comment - - - - -  Follow up - - - Falls prevention discussed Falls prevention discussed     Assessment & Plan  1. Hernia, inguinal, right  - Ambulatory referral to General Surgery  Discussed symptoms of incarceration and when to go to [facility] if needed

## 2020-08-08 ENCOUNTER — Other Ambulatory Visit: Payer: Self-pay

## 2020-08-08 ENCOUNTER — Ambulatory Visit (INDEPENDENT_AMBULATORY_CARE_PROVIDER_SITE_OTHER): Payer: Medicare HMO | Admitting: Family Medicine

## 2020-08-08 ENCOUNTER — Encounter: Payer: Self-pay | Admitting: Family Medicine

## 2020-08-08 VITALS — BP 118/70 | HR 100 | Temp 97.9°F | Resp 17 | Ht 69.0 in | Wt 184.5 lb

## 2020-08-08 DIAGNOSIS — K4090 Unilateral inguinal hernia, without obstruction or gangrene, not specified as recurrent: Secondary | ICD-10-CM | POA: Diagnosis not present

## 2020-08-08 NOTE — Patient Instructions (Signed)
Inguinal Hernia, Adult An inguinal hernia is when fat or your intestines push through a weak spot in a muscle where your leg meets your lower belly (groin). This causes a bulge. This kind of hernia could also be:  In your scrotum, if you are male.  In folds of skin around your vagina, if you are male. There are three types of inguinal hernias:  Hernias that can be pushed back into the belly (are reducible). This type rarely causes pain.  Hernias that cannot be pushed back into the belly (are incarcerated).  Hernias that cannot be pushed back into the belly and lose their blood supply (are strangulated). This type needs emergency surgery. What are the causes? This condition is caused by having a weak spot in the muscles or tissues in your groin. This develops over time. The hernia may poke through the weak spot when you strain your lower belly muscles all of a sudden, such as when you:  Lift a heavy object.  Strain to poop (have a bowel movement). Trouble pooping (constipation) can lead to straining.  Cough. What increases the risk? This condition is more likely to develop in:  Males.  Pregnant females.  People who: ? Are overweight. ? Work in jobs that require long periods of standing or heavy lifting. ? Have had an inguinal hernia before. ? Smoke or have lung disease. These factors can lead to long-term (chronic) coughing. What are the signs or symptoms? Symptoms may depend on the size of the hernia. Often, a small hernia has no symptoms. Symptoms of a larger hernia may include:  A bulge in the groin area. This is easier to see when standing. You might not be able to see it when you are lying down.  Pain or burning in the groin. This may get worse when you lift, strain, or cough.  A dull ache or a feeling of pressure in the groin.  An abnormal bulge in the scrotum, in males. Symptoms of a strangulated inguinal hernia may include:  A bulge in your groin that is very  painful and tender to the touch.  A bulge that turns red or purple.  Fever, feeling like you may vomit (nausea), and vomiting.  Not being able to poop or to pass gas. How is this treated? Treatment depends on the size of your hernia and whether you have symptoms. If you do not have symptoms, your doctor may have you watch your hernia carefully and have you come in for follow-up visits. If your hernia is large or if you have symptoms, you may need surgery to repair the hernia. Follow these instructions at home: Lifestyle  Avoid lifting heavy objects.  Avoid standing for long amounts of time.  Do not smoke or use any products that contain nicotine or tobacco. If you need help quitting, ask your doctor.  Stay at a healthy weight. Prevent trouble pooping You may need to take these actions to prevent or treat trouble pooping:  Drink enough fluid to keep your pee (urine) pale yellow.  Take over-the-counter or prescription medicines.  Eat foods that are high in fiber. These include beans, whole grains, and fresh fruits and vegetables.  Limit foods that are high in fat and sugar. These include fried or sweet foods. General instructions  You may try to push your hernia back in place by very gently pressing on it when you are lying down. Do not try to push the bulge back in if it will not go in   easily.  Watch your hernia for any changes in shape, size, or color. Tell your doctor if you see any changes.  Take over-the-counter and prescription medicines only as told by your doctor.  Keep all follow-up visits. Contact a doctor if:  You have a fever or chills.  You have new symptoms.  Your symptoms get worse. Get help right away if:  You have pain in your groin that gets worse all of a sudden.  You have a bulge in your groin that: ? Gets bigger all of a sudden, and it does not get smaller after that. ? Turns red or purple. ? Is painful when you touch it.  You are a male, and  you have: ? Sudden pain in your scrotum. ? A sudden change in the size of your scrotum.  You cannot push the hernia back in place by very gently pressing on it when you are lying down.  You feel like you may vomit, and that feeling does not go away.  You keep vomiting.  You have a fast heartbeat.  You cannot poop or pass gas. These symptoms may be an emergency. Get help right away. Call your local emergency services (911 in the [place]  Do not wait to see if the symptoms will go away.  Do not drive yourself to the hospital. Summary  An inguinal hernia is when fat or your intestines push through a weak spot in a muscle where your leg meets your lower belly (groin). This causes a bulge.  If you do not have symptoms, you may not need treatment. If you have symptoms or a large hernia, you may need surgery.  Avoid lifting heavy objects. Also, avoid standing for long amounts of time.  Do not try to push the bulge back in if it will not go in easily. This information is not intended to replace advice given to you by your health care provider. Make sure you discuss any questions you have with your health care provider. Document Revised: 02/27/2020 Document Reviewed: 02/27/2020 Elsevier Patient Education  2021 [employer]  

## 2020-08-15 ENCOUNTER — Ambulatory Visit (INDEPENDENT_AMBULATORY_CARE_PROVIDER_SITE_OTHER): Payer: Medicare HMO | Admitting: General Surgery

## 2020-08-15 ENCOUNTER — Encounter: Payer: Self-pay | Admitting: General Surgery

## 2020-08-15 ENCOUNTER — Other Ambulatory Visit: Payer: Self-pay

## 2020-08-15 VITALS — BP 120/84 | HR 92 | Temp 98.0°F | Ht 69.0 in | Wt 183.0 lb

## 2020-08-15 DIAGNOSIS — K4090 Unilateral inguinal hernia, without obstruction or gangrene, not specified as recurrent: Secondary | ICD-10-CM | POA: Diagnosis not present

## 2020-08-15 NOTE — H&P (View-Only) (Signed)
Patient ID: Ernest Sweeney, male   DOB: 07/24/1950, 70 y.o.   MRN: [id number]  Chief Complaint  Patient presents with  . New Patient (Initial Visit)  . Inguinal Hernia    Right inguinal hernia    HPI Ernest Sweeney is a 70 y.o. male.   He has been referred by his primary care provider, Dr. [name], for further evaluation of right inguinal hernia.  Ernest Sweeney states that he first noticed a swelling or bulging in the area after he had his prostate surgery.  He says that it eventually got better but within the last few weeks, it has become more prominent again.  He endorses chronic constipation, but denies any difficulty with urination.  It seems to be aggravated by physical activity and he says that sometimes it feels like there is water gurgling around in it.  It goes away when he lies supine.  He denies any nausea or vomiting.  It has never become red, hot, or exquisitely painful.  He is interested in surgical repair.  Notably, he has a history of robot assisted laparoscopic radical prostatectomy with pelvic lymph node dissection and radiation therapy for prostate cancer.   Past Medical History:  Diagnosis Date  . Anxiety    Panic attacks in the past  . Arthritis   . Benign prostatic hypertrophy without lower urinary tract symptoms   . Cancer [place])    Prostate  . Colon polyp   . GERD (gastroesophageal reflux disease)   . Glaucoma (increased eye pressure)    Left eye only  . Hyperlipidemia   . Hypertension     Past Surgical History:  Procedure Laterality Date  . COLONOSCOPY  2010   Dr Sula Rumple  . COLONOSCOPY WITH PROPOFOL N/A 01/05/2020   Procedure: COLONOSCOPY WITH PROPOFOL;  Surgeon: [name], MD;  Location: [facility] ENDOSCOPY;  Service: Gastroenterology;  Laterality: N/A;  . excision skin cyst  06-19-14   Dr. Festus Aloe CYST  . PELVIC LYMPH NODE DISSECTION N/A 07/15/2016   Procedure: PELVIC LYMPH NODE DISSECTION;  Surgeon: Nickie Retort, MD;  Location: [facility] ORS;   Service: Urology;  Laterality: N/A;  . ROBOT ASSISTED LAPAROSCOPIC RADICAL PROSTATECTOMY N/A 07/15/2016   Procedure: ROBOTIC ASSISTED LAPAROSCOPIC RADICAL PROSTATECTOMY;  Surgeon: Nickie Retort, MD;  Location: [facility] ORS;  Service: Urology;  Laterality: N/A;    Family History  Problem Relation Age of Onset  . Heart disease Mother   . Heart attack Mother   . Diabetes Father   . Heart attack Father   . Dementia Father   . Cancer Sister        Breast  . Cancer Brother        Prostate  . Healthy Sister   . Stroke Brother     Social History Social History   Tobacco Use  . Smoking status: Former Smoker    Packs/day: 0.50    Years: 40.00    Pack years: 20.00    Types: Cigarettes    Quit date: 03/23/2016    Years since quitting: 4.4  . Smokeless tobacco: Never Used  . Tobacco comment: to stay quit   Vaping Use  . Vaping Use: Never used  Substance Use Topics  . Alcohol use: Yes    Alcohol/week: 2.0 - 4.0 standard drinks    Types: 2 - 4 Standard drinks or equivalent per week    Comment: 1-2 times per week  . Drug use: No    No Known Allergies  Current  Outpatient Medications  Medication Sig Dispense Refill  . aspirin 81 MG tablet Take 81 mg by mouth daily.    Marland Kitchen azelastine (ASTELIN) 0.1 % nasal spray Place 2 sprays into both nostrils 2 (two) times daily. Use in each nostril as directed 30 mL 2  . brimonidine-timolol (COMBIGAN) 0.2-0.5 % ophthalmic solution Place 1 drop into both eyes every 12 (twelve) hours.    . Cholecalciferol (VITAMIN D) 2000 units CAPS Take 1 capsule (2,000 Units total) by mouth daily. 30 capsule 0  . fluticasone (FLONASE) 50 MCG/ACT nasal spray Place 2 sprays into both nostrils daily. 16 g 6  . latanoprost (XALATAN) 0.005 % ophthalmic solution Place 1 drop into both eyes at bedtime.     . rosuvastatin (CRESTOR) 40 MG tablet Take 1 tablet (40 mg total) by mouth daily. In place of Atorvastatin 90 tablet 1  . sildenafil (VIAGRA) 100 MG tablet Take 1 tablet  (100 mg total) by mouth daily as needed for erectile dysfunction. 30 tablet 0  . triamcinolone cream (KENALOG) 0.1 % APPLY  CREAM EXTERNALLY TWICE DAILY 45 g 0  . lisinopril (ZESTRIL) 10 MG tablet Take 1-2 tablets (10-20 mg total) by mouth 2 (two) times daily. 20 mg in am and one in pm 270 tablet 1   No current facility-administered medications for this visit.    Review of Systems Review of Systems  All other systems reviewed and are negative. Or as discussed in the history of present illness. Blood pressure 120/84, pulse 92, temperature 98 F (36.7 C), temperature source Oral, height 5\' 9"  (1.753 m), weight 183 lb (83 kg), SpO2 97 %. Body mass index is 27.02 kg/m.  Physical Exam Physical Exam Vitals reviewed. Exam conducted with a chaperone present.  Constitutional:      General: He is not in acute distress.    Appearance: Normal appearance.  HENT:     Head: Normocephalic and atraumatic.     Nose:     Comments: Covered with a mask    Mouth/Throat:     Comments: Covered with a mask Eyes:     General: No scleral icterus.       Right eye: No discharge.        Left eye: No discharge.     Conjunctiva/sclera: Conjunctivae normal.  Neck:     Comments: The trachea is midline.  There is no palpable cervical or supraclavicular lymphadenopathy.  No thyromegaly or dominant thyroid masses appreciated.  The gland moves freely with deglutition. Cardiovascular:     Rate and Rhythm: Normal rate and regular rhythm.  Pulmonary:     Effort: Pulmonary effort is normal.     Breath sounds: Normal breath sounds.  Abdominal:     General: Abdomen is flat.     Palpations: Abdomen is soft.  Genitourinary:      Comments: There is a reducible right-sided inguinal hernia.  No left-sided hernia appreciated. Musculoskeletal:        General: No swelling or tenderness.  Skin:    General: Skin is warm and dry.  Neurological:     General: No focal deficit present.     Mental Status: He is alert and  oriented to person, place, and time.  Psychiatric:        Mood and Affect: Mood normal.        Behavior: Behavior normal.     Data Reviewed I reviewed the operative report from his radical prostatectomy to confirm the extent of dissection and the approach taken.  I also reviewed Dr. Ancil Boozer clinic note from August 08, 2020.  She diagnosed him with a right inguinal hernia and referred him to general surgery.  Results for Ernest, Sweeney (MRN [id number]) as of 08/15/2020 15:28  Ref. Range 05/02/2020 10:39  Sodium Latest Ref Range: 135 - 146 mmol/L 139  Potassium Latest Ref Range: 3.5 - 5.3 mmol/L 4.8  Chloride Latest Ref Range: 98 - 110 mmol/L 102  CO2 Latest Ref Range: 20 - 32 mmol/L 31  Glucose Latest Ref Range: 65 - 99 mg/dL 89  Mean Plasma Glucose Latest Units: (calc) 108  BUN Latest Ref Range: 7 - 25 mg/dL 14  Creatinine Latest Ref Range: 0.70 - 1.25 mg/dL 1.16  Calcium Latest Ref Range: 8.6 - 10.3 mg/dL 10.2  BUN/Creatinine Ratio Latest Ref Range: 6 - 22 (calc) NOT APPLICABLE  AG Ratio Latest Ref Range: 1.0 - 2.5 (calc) 2.0  AST Latest Ref Range: 10 - 35 U/L 14  ALT Latest Ref Range: 9 - 46 U/L 14  Total Protein Latest Ref Range: 6.1 - 8.1 g/dL 6.9  Total Bilirubin Latest Ref Range: 0.2 - 1.2 mg/dL 1.3 (H)  GFR, Est Non African American Latest Ref Range: > OR = 60 mL/min/1.6m2 64  GFR, Est African American Latest Ref Range: > OR = 60 mL/min/1.77m2 74  Total CHOL/HDL Ratio Latest Ref Range: <5.0 (calc) 2.1  Cholesterol Latest Ref Range: <200 mg/dL 150  HDL Cholesterol Latest Ref Range: > OR = 40 mg/dL 72  LDL Cholesterol (Calc) Latest Units: mg/dL (calc) 64  Non-HDL Cholesterol (Calc) Latest Ref Range: <130 mg/dL (calc) 78  Triglycerides Latest Ref Range: <150 mg/dL 60  Alkaline phosphatase (APISO) Latest Ref Range: 35 - 144 U/L 47  Vitamin D, 25-Hydroxy Latest Ref Range: 30 - 100 ng/mL 34  Globulin Latest Ref Range: 1.9 - 3.7 g/dL (calc) 2.3  WBC Latest Ref Range: 3.8 - 10.8  Thousand/uL 2.3 (L)  RBC Latest Ref Range: 4.20 - 5.80 Million/uL 4.53  Hemoglobin Latest Ref Range: 13.2 - 17.1 g/dL 15.0  HCT Latest Ref Range: 38.5 - 50.0 % 44.7  MCV Latest Ref Range: 80.0 - 100.0 fL 98.7  MCH Latest Ref Range: 27.0 - 33.0 pg 33.1 (H)  MCHC Latest Ref Range: 32.0 - 36.0 g/dL 33.6  RDW Latest Ref Range: 11.0 - 15.0 % 12.1  Platelets Latest Ref Range: 140 - 400 Thousand/uL 302  MPV Latest Ref Range: 7.5 - 12.5 fL 9.6  These are the most recent labs I have available.  It does show mild neutropenia with a white blood cell count of 2300 and a mild elevation in his total bilirubin at 1.3  Assessment This is a 70 year old man with a surgical history notable for robot-assisted laparoscopic radical prostatectomy and pelvic lymph node dissection.  He has a right inguinal hernia that is becoming symptomatic.  Plan I have recommended that he undergo surgical repair of his right inguinal hernia.  Normally, I would perform this via a robot-assisted laparoscopic approach, however he has had extensive surgery in the area already, as well as radiation and I am concerned about the degree of scarring and fibrosis that he may encounter.  I therefore think it is more appropriate to perform this via an open approach. I have explained the procedure, risks, and aftercare of inguinal hernia repair to Ernest Sweeney.   Risks include but are not limited to bleeding, infection, wound problems, anesthesia, recurrence, bladder or intestine injury, urinary retention, testicular dysfunction, chronic pain,  mesh problems.  He  seems to understand and agrees to proceed.  Questions were answered to his stated satisfaction.  We will work on getting him scheduled.    Ernest Sweeney 08/15/2020, 3:19 PM

## 2020-08-15 NOTE — Patient Instructions (Addendum)
Our surgery scheduler will contact you in the next 24 to 48 hours. Please have your blue surgery sheet available when speaking with her.  Inguinal Hernia, Adult An inguinal hernia is when fat or your intestines push through a weak spot in a muscle where your leg meets your lower belly (groin). This causes a bulge. This kind of hernia could also be:  In your scrotum, if you are male.  In folds of skin around your vagina, if you are male. There are three types of inguinal hernias:  Hernias that can be pushed back into the belly (are reducible). This type rarely causes pain.  Hernias that cannot be pushed back into the belly (are incarcerated).  Hernias that cannot be pushed back into the belly and lose their blood supply (are strangulated). This type needs emergency surgery. What are the causes? This condition is caused by having a weak spot in the muscles or tissues in your groin. This develops over time. The hernia may poke through the weak spot when you strain your lower belly muscles all of a sudden, such as when you:  Lift a heavy object.  Strain to poop (have a bowel movement). Trouble pooping (constipation) can lead to straining.  Cough. What increases the risk? This condition is more likely to develop in:  Males.  Pregnant females.  People who: ? Are overweight. ? Work in jobs that require long periods of standing or heavy lifting. ? Have had an inguinal hernia before. ? Smoke or have lung disease. These factors can lead to long-term (chronic) coughing. What are the signs or symptoms? Symptoms may depend on the size of the hernia. Often, a small hernia has no symptoms. Symptoms of a larger hernia may include:  A bulge in the groin area. This is easier to see when standing. You might not be able to see it when you are lying down.  Pain or burning in the groin. This may get worse when you lift, strain, or cough.  A dull ache or a feeling of pressure in the groin.  An  abnormal bulge in the scrotum, in males. Symptoms of a strangulated inguinal hernia may include:  A bulge in your groin that is very painful and tender to the touch.  A bulge that turns red or purple.  Fever, feeling like you may vomit (nausea), and vomiting.  Not being able to poop or to pass gas. How is this treated? Treatment depends on the size of your hernia and whether you have symptoms. If you do not have symptoms, your doctor may have you watch your hernia carefully and have you come in for follow-up visits. If your hernia is large or if you have symptoms, you may need surgery to repair the hernia. Follow these instructions at home: Lifestyle  Avoid lifting heavy objects.  Avoid standing for long amounts of time.  Do not smoke or use any products that contain nicotine or tobacco. If you need help quitting, ask your doctor.  Stay at a healthy weight. Prevent trouble pooping You may need to take these actions to prevent or treat trouble pooping:  Drink enough fluid to keep your pee (urine) pale yellow.  Take over-the-counter or prescription medicines.  Eat foods that are high in fiber. These include beans, whole grains, and fresh fruits and vegetables.  Limit foods that are high in fat and sugar. These include fried or sweet foods. General instructions  You may try to push your hernia back in place by  very gently pressing on it when you are lying down. Do not try to push the bulge back in if it will not go in easily.  Watch your hernia for any changes in shape, size, or color. Tell your doctor if you see any changes.  Take over-the-counter and prescription medicines only as told by your doctor.  Keep all follow-up visits. Contact a doctor if:  You have a fever or chills.  You have new symptoms.  Your symptoms get worse. Get help right away if:  You have pain in your groin that gets worse all of a sudden.  You have a bulge in your groin that: ? Gets bigger  all of a sudden, and it does not get smaller after that. ? Turns red or purple. ? Is painful when you touch it.  You are a male, and you have: ? Sudden pain in your scrotum. ? A sudden change in the size of your scrotum.  You cannot push the hernia back in place by very gently pressing on it when you are lying down.  You feel like you may vomit, and that feeling does not go away.  You keep vomiting.  You have a fast heartbeat.  You cannot poop or pass gas. These symptoms may be an emergency. Get help right away. Call your local emergency services (911 in the [place]  Do not wait to see if the symptoms will go away.  Do not drive yourself to the hospital. Summary  An inguinal hernia is when fat or your intestines push through a weak spot in a muscle where your leg meets your lower belly (groin). This causes a bulge.  If you do not have symptoms, you may not need treatment. If you have symptoms or a large hernia, you may need surgery.  Avoid lifting heavy objects. Also, avoid standing for long amounts of time.  Do not try to push the bulge back in if it will not go in easily. This information is not intended to replace advice given to you by your health care provider. Make sure you discuss any questions you have with your health care provider. Document Revised: 02/27/2020 Document Reviewed: 02/27/2020 Elsevier Patient Education  2021 [employer].

## 2020-08-15 NOTE — Progress Notes (Signed)
Patient ID: Ernest Sweeney, male   DOB: 07/24/1950, 70 y.o.   MRN: [id number]  Chief Complaint  Patient presents with  . New Patient (Initial Visit)  . Inguinal Hernia    Right inguinal hernia    HPI Ernest Sweeney is a 70 y.o. male.   He has been referred by his primary care provider, Dr. [name], for further evaluation of right inguinal hernia.  Ernest Sweeney states that he first noticed a swelling or bulging in the area after he had his prostate surgery.  He says that it eventually got better but within the last few weeks, it has become more prominent again.  He endorses chronic constipation, but denies any difficulty with urination.  It seems to be aggravated by physical activity and he says that sometimes it feels like there is water gurgling around in it.  It goes away when he lies supine.  He denies any nausea or vomiting.  It has never become red, hot, or exquisitely painful.  He is interested in surgical repair.  Notably, he has a history of robot assisted laparoscopic radical prostatectomy with pelvic lymph node dissection and radiation therapy for prostate cancer.   Past Medical History:  Diagnosis Date  . Anxiety    Panic attacks in the past  . Arthritis   . Benign prostatic hypertrophy without lower urinary tract symptoms   . Cancer [facility])    Prostate  . Colon polyp   . GERD (gastroesophageal reflux disease)   . Glaucoma (increased eye pressure)    Left eye only  . Hyperlipidemia   . Hypertension     Past Surgical History:  Procedure Laterality Date  . COLONOSCOPY  2010   Dr Sula Rumple  . COLONOSCOPY WITH PROPOFOL N/A 01/05/2020   Procedure: COLONOSCOPY WITH PROPOFOL;  Surgeon: [name], MD;  Location: [facility] ENDOSCOPY;  Service: Gastroenterology;  Laterality: N/A;  . excision skin cyst  06-19-14   Dr. Festus Aloe CYST  . PELVIC LYMPH NODE DISSECTION N/A 07/15/2016   Procedure: PELVIC LYMPH NODE DISSECTION;  Surgeon: Nickie Retort, MD;  Location: [facility] ORS;   Service: Urology;  Laterality: N/A;  . ROBOT ASSISTED LAPAROSCOPIC RADICAL PROSTATECTOMY N/A 07/15/2016   Procedure: ROBOTIC ASSISTED LAPAROSCOPIC RADICAL PROSTATECTOMY;  Surgeon: Nickie Retort, MD;  Location: [facility] ORS;  Service: Urology;  Laterality: N/A;    Family History  Problem Relation Age of Onset  . Heart disease Mother   . Heart attack Mother   . Diabetes Father   . Heart attack Father   . Dementia Father   . Cancer Sister        Breast  . Cancer Brother        Prostate  . Healthy Sister   . Stroke Brother     Social History Social History   Tobacco Use  . Smoking status: Former Smoker    Packs/day: 0.50    Years: 40.00    Pack years: 20.00    Types: Cigarettes    Quit date: 03/23/2016    Years since quitting: 4.4  . Smokeless tobacco: Never Used  . Tobacco comment: to stay quit   Vaping Use  . Vaping Use: Never used  Substance Use Topics  . Alcohol use: Yes    Alcohol/week: 2.0 - 4.0 standard drinks    Types: 2 - 4 Standard drinks or equivalent per week    Comment: 1-2 times per week  . Drug use: No    No Known Allergies  Current  Outpatient Medications  Medication Sig Dispense Refill  . aspirin 81 MG tablet Take 81 mg by mouth daily.    Marland Kitchen azelastine (ASTELIN) 0.1 % nasal spray Place 2 sprays into both nostrils 2 (two) times daily. Use in each nostril as directed 30 mL 2  . brimonidine-timolol (COMBIGAN) 0.2-0.5 % ophthalmic solution Place 1 drop into both eyes every 12 (twelve) hours.    . Cholecalciferol (VITAMIN D) 2000 units CAPS Take 1 capsule (2,000 Units total) by mouth daily. 30 capsule 0  . fluticasone (FLONASE) 50 MCG/ACT nasal spray Place 2 sprays into both nostrils daily. 16 g 6  . latanoprost (XALATAN) 0.005 % ophthalmic solution Place 1 drop into both eyes at bedtime.     . rosuvastatin (CRESTOR) 40 MG tablet Take 1 tablet (40 mg total) by mouth daily. In place of Atorvastatin 90 tablet 1  . sildenafil (VIAGRA) 100 MG tablet Take 1 tablet  (100 mg total) by mouth daily as needed for erectile dysfunction. 30 tablet 0  . triamcinolone cream (KENALOG) 0.1 % APPLY  CREAM EXTERNALLY TWICE DAILY 45 g 0  . lisinopril (ZESTRIL) 10 MG tablet Take 1-2 tablets (10-20 mg total) by mouth 2 (two) times daily. 20 mg in am and one in pm 270 tablet 1   No current facility-administered medications for this visit.    Review of Systems Review of Systems  All other systems reviewed and are negative. Or as discussed in the history of present illness. Blood pressure 120/84, pulse 92, temperature 98 F (36.7 C), temperature source Oral, height 5\' 9"  (1.753 m), weight 183 lb (83 kg), SpO2 97 %. Body mass index is 27.02 kg/m.  Physical Exam Physical Exam Vitals reviewed. Exam conducted with a chaperone present.  Constitutional:      General: He is not in acute distress.    Appearance: Normal appearance.  HENT:     Head: Normocephalic and atraumatic.     Nose:     Comments: Covered with a mask    Mouth/Throat:     Comments: Covered with a mask Eyes:     General: No scleral icterus.       Right eye: No discharge.        Left eye: No discharge.     Conjunctiva/sclera: Conjunctivae normal.  Neck:     Comments: The trachea is midline.  There is no palpable cervical or supraclavicular lymphadenopathy.  No thyromegaly or dominant thyroid masses appreciated.  The gland moves freely with deglutition. Cardiovascular:     Rate and Rhythm: Normal rate and regular rhythm.  Pulmonary:     Effort: Pulmonary effort is normal.     Breath sounds: Normal breath sounds.  Abdominal:     General: Abdomen is flat.     Palpations: Abdomen is soft.  Genitourinary:      Comments: There is a reducible right-sided inguinal hernia.  No left-sided hernia appreciated. Musculoskeletal:        General: No swelling or tenderness.  Skin:    General: Skin is warm and dry.  Neurological:     General: No focal deficit present.     Mental Status: He is alert and  oriented to person, place, and time.  Psychiatric:        Mood and Affect: Mood normal.        Behavior: Behavior normal.     Data Reviewed I reviewed the operative report from his radical prostatectomy to confirm the extent of dissection and the approach taken.  I also reviewed Dr. Ancil Boozer clinic note from August 08, 2020.  She diagnosed him with a right inguinal hernia and referred him to general surgery.  Results for DENNEY, WILLY (MRN [id number]) as of 08/15/2020 15:28  Ref. Range 05/02/2020 10:39  Sodium Latest Ref Range: 135 - 146 mmol/L 139  Potassium Latest Ref Range: 3.5 - 5.3 mmol/L 4.8  Chloride Latest Ref Range: 98 - 110 mmol/L 102  CO2 Latest Ref Range: 20 - 32 mmol/L 31  Glucose Latest Ref Range: 65 - 99 mg/dL 89  Mean Plasma Glucose Latest Units: (calc) 108  BUN Latest Ref Range: 7 - 25 mg/dL 14  Creatinine Latest Ref Range: 0.70 - 1.25 mg/dL 1.16  Calcium Latest Ref Range: 8.6 - 10.3 mg/dL 10.2  BUN/Creatinine Ratio Latest Ref Range: 6 - 22 (calc) NOT APPLICABLE  AG Ratio Latest Ref Range: 1.0 - 2.5 (calc) 2.0  AST Latest Ref Range: 10 - 35 U/L 14  ALT Latest Ref Range: 9 - 46 U/L 14  Total Protein Latest Ref Range: 6.1 - 8.1 g/dL 6.9  Total Bilirubin Latest Ref Range: 0.2 - 1.2 mg/dL 1.3 (H)  GFR, Est Non African American Latest Ref Range: > OR = 60 mL/min/1.82m2 64  GFR, Est African American Latest Ref Range: > OR = 60 mL/min/1.24m2 74  Total CHOL/HDL Ratio Latest Ref Range: <5.0 (calc) 2.1  Cholesterol Latest Ref Range: <200 mg/dL 150  HDL Cholesterol Latest Ref Range: > OR = 40 mg/dL 72  LDL Cholesterol (Calc) Latest Units: mg/dL (calc) 64  Non-HDL Cholesterol (Calc) Latest Ref Range: <130 mg/dL (calc) 78  Triglycerides Latest Ref Range: <150 mg/dL 60  Alkaline phosphatase (APISO) Latest Ref Range: 35 - 144 U/L 47  Vitamin D, 25-Hydroxy Latest Ref Range: 30 - 100 ng/mL 34  Globulin Latest Ref Range: 1.9 - 3.7 g/dL (calc) 2.3  WBC Latest Ref Range: 3.8 - 10.8  Thousand/uL 2.3 (L)  RBC Latest Ref Range: 4.20 - 5.80 Million/uL 4.53  Hemoglobin Latest Ref Range: 13.2 - 17.1 g/dL 15.0  HCT Latest Ref Range: 38.5 - 50.0 % 44.7  MCV Latest Ref Range: 80.0 - 100.0 fL 98.7  MCH Latest Ref Range: 27.0 - 33.0 pg 33.1 (H)  MCHC Latest Ref Range: 32.0 - 36.0 g/dL 33.6  RDW Latest Ref Range: 11.0 - 15.0 % 12.1  Platelets Latest Ref Range: 140 - 400 Thousand/uL 302  MPV Latest Ref Range: 7.5 - 12.5 fL 9.6  These are the most recent labs I have available.  It does show mild neutropenia with a white blood cell count of 2300 and a mild elevation in his total bilirubin at 1.3  Assessment This is a 70 year old man with a surgical history notable for robot-assisted laparoscopic radical prostatectomy and pelvic lymph node dissection.  He has a right inguinal hernia that is becoming symptomatic.  Plan I have recommended that he undergo surgical repair of his right inguinal hernia.  Normally, I would perform this via a robot-assisted laparoscopic approach, however he has had extensive surgery in the area already, as well as radiation and I am concerned about the degree of scarring and fibrosis that he may encounter.  I therefore think it is more appropriate to perform this via an open approach. I have explained the procedure, risks, and aftercare of inguinal hernia repair to Ernest Sweeney.   Risks include but are not limited to bleeding, infection, wound problems, anesthesia, recurrence, bladder or intestine injury, urinary retention, testicular dysfunction, chronic pain,  mesh problems.  He  seems to understand and agrees to proceed.  Questions were answered to his stated satisfaction.  We will work on getting him scheduled.    Fredirick Maudlin 08/15/2020, 3:19 PM

## 2020-08-16 ENCOUNTER — Telehealth: Payer: Self-pay | Admitting: General Surgery

## 2020-08-16 NOTE — Telephone Encounter (Signed)
Outgoing call is made, left message for patient to call.  Please advise patient of Pre-Admission date/time, COVID Testing date and Surgery date.  Surgery Date: 09/06/20 Preadmission Testing Date: 08/30/20 (phone 8a-1p) Covid Testing Date: 09/04/20 - patient advised to go to the [facility] ([place] between 8a-1p  Also patient needs to call at [phone], between 1-3:00pm the day before surgery, to find out what time to arrive for surgery.

## 2020-08-16 NOTE — Telephone Encounter (Signed)
Incoming call from the patient, he is aware of all dates regarding his surgery and voices understanding.  

## 2020-08-19 ENCOUNTER — Telehealth: Payer: Self-pay | Admitting: Family Medicine

## 2020-08-19 NOTE — Telephone Encounter (Signed)
Received call from Mr. Cupp regarding transportation for his surgery on September 06, 2020. Informed patient that he will need to give the office a call in order for a referral to be placed and a Care Guide will be able to assist him. Also informed patient that he can give [employer] a call to see if they offer transportation as a benefit. Patient stated understanding.    [place], Care Management Phone: [phone] Email: [email or web address]

## 2020-08-30 ENCOUNTER — Other Ambulatory Visit: Payer: Self-pay

## 2020-08-30 ENCOUNTER — Other Ambulatory Visit
Admission: RE | Admit: 2020-08-30 | Discharge: 2020-08-30 | Disposition: A | Discharge status: Home / Self Care | Payer: Medicare HMO | Source: Ambulatory Visit | Attending: General Surgery | Admitting: General Surgery

## 2020-08-30 NOTE — Patient Instructions (Signed)
Your procedure is scheduled on: Friday September 06, 2020. Report to Day Surgery inside [facility] 2nd floor (stop by Admissions desk first before getting on Elevator). To find out your arrival time please call [phone] between 1PM - 3PM on Thursday September 05, 2020.  Remember: Instructions that are not followed completely may result in serious medical risk,  up to and including death, or upon the discretion of your surgeon and anesthesiologist your  surgery may need to be rescheduled.     _X__ 1. Do not eat food after midnight the night before your procedure.                 No chewing gum or hard candies. You may drink clear liquids up to 2 hours                 before you are scheduled to arrive for your surgery- DO not drink clear                 liquids within 2 hours of the start of your surgery.                 Clear Liquids include:  water, apple juice without pulp, clear Gatorade, G2 or                  Gatorade Zero (avoid Red/Purple/Blue), Black Coffee or Tea (Do not add                 anything to coffee or tea).  __X__2.  On the morning of surgery brush your teeth with toothpaste and water, you                may rinse your mouth with mouthwash if you wish.  Do not swallow any toothpaste of mouthwash.     _X__ 3.  No Alcohol for 24 hours before or after surgery.   _X__ 4.  Do Not Smoke or use e-cigarettes For 24 Hours Prior to Your Surgery.                 Do not use any chewable tobacco products for at least 6 hours prior to                 Surgery.  _X__  5.  Do not use any recreational drugs (marijuana, cocaine, heroin, ecstasy, MDMA or other)                For at least one week prior to your surgery.  Combination of these drugs with anesthesia                May have life threatening results.  __X__ 6.  Notify your doctor if there is any change in your medical condition      (cold, fever, infections).     Do not wear jewelry, make-up,  hairpins, clips or nail polish. Do not wear lotions, powders, or perfumes. You may wear deodorant. Do not shave 48 hours prior to surgery. Men may shave face and neck. Do not bring valuables to the hospital.    [facility] is not responsible for any belongings or valuables.  Contacts, dentures or bridgework may not be worn into surgery. Leave your suitcase in the car. After surgery it may be brought to your room. For patients admitted to the hospital, discharge time is determined by your treatment team.   Patients discharged the day of surgery will not be allowed to drive  home.   Make arrangements for someone to be with you for the first 24 hours of your Same Day Discharge.   __X__ Take these medicines the morning of surgery with A SIP OF WATER:    1. rosuvastatin (CRESTOR) 40 MG   ____ Fleet Enema (as directed)   __X__ Use CHG Soap (or wipes) as directed  ____ Use Benzoyl Peroxide Gel as instructed  ____ Use inhalers on the day of surgery  ____ Stop metformin 2 days prior to surgery    ____ Take 1/2 of usual insulin dose the night before surgery. No insulin the morning          of surgery.   __X__ Call your doctor and ask when to stop your aspirin 81 MG and let them know about your surgery date.   __X__ Stop Anti-inflammatories such as Ibuprofen, Aleve, Advil, naproxen, and or BC powders.    __X__ Stop supplements until after surgery.    __X__ Do not start any herbal supplements before your procedure.    If you have any questions regarding your pre-procedure instructions,  Please call Pre-admit Testing at [phone].

## 2020-09-02 ENCOUNTER — Other Ambulatory Visit: Payer: Self-pay

## 2020-09-02 ENCOUNTER — Encounter
Admission: RE | Admit: 2020-09-02 | Discharge: 2020-09-02 | Disposition: A | Discharge status: Home / Self Care | Payer: Medicare HMO | Source: Ambulatory Visit | Attending: General Surgery | Admitting: General Surgery

## 2020-09-02 ENCOUNTER — Telehealth: Payer: Self-pay | Admitting: Family Medicine

## 2020-09-02 DIAGNOSIS — Z01818 Encounter for other preprocedural examination: Secondary | ICD-10-CM | POA: Insufficient documentation

## 2020-09-02 DIAGNOSIS — I10 Essential (primary) hypertension: Secondary | ICD-10-CM | POA: Diagnosis not present

## 2020-09-02 LAB — BASIC METABOLIC PANEL
Anion gap: 7 (ref 5–15)
BUN: 14 mg/dL (ref 8–23)
CO2: 26 mmol/L (ref 22–32)
Calcium: 9.3 mg/dL (ref 8.9–10.3)
Chloride: 103 mmol/L (ref 98–111)
Creatinine, Ser: 1.14 mg/dL (ref 0.61–1.24)
GFR, Estimated: >60 mL/min (ref >60)
Glucose, Bld: 92 mg/dL (ref 70–99)
Potassium: 3.7 mmol/L (ref 3.5–5.1)
Sodium: 136 mmol/L (ref 135–145)

## 2020-09-02 LAB — CBC
HCT: 43.4 % (ref 39.0–52.0)
Hemoglobin: 14.9 g/dL (ref 13.0–17.0)
MCH: 32.7 pg (ref 26.0–34.0)
MCHC: 34.3 g/dL (ref 30.0–36.0)
MCV: 95.4 fL (ref 80.0–100.0)
Platelets: 282 10*3/uL (ref 150–400)
RBC: 4.55 MIL/uL (ref 4.22–5.81)
RDW: 12.2 % (ref 11.5–15.5)
WBC: 2.7 10*3/uL — ABNORMAL LOW (ref 4.0–10.5)
nRBC: 0 % (ref 0.0–0.2)

## 2020-09-02 NOTE — Telephone Encounter (Signed)
Pt is calling and per pt he is having hernia surgery on this Friday 09-06-2020 and pre surgical would like to know if he needs to stop taking aspirin. Pt is aware normally [facility] send md medical clearance form. Dr cannon will be doing the surgery

## 2020-09-03 NOTE — Telephone Encounter (Signed)
Pt.notified

## 2020-09-04 ENCOUNTER — Other Ambulatory Visit
Admission: RE | Admit: 2020-09-04 | Discharge: 2020-09-04 | Disposition: A | Discharge status: Home / Self Care | Payer: Medicare HMO | Source: Ambulatory Visit | Attending: General Surgery | Admitting: General Surgery

## 2020-09-04 ENCOUNTER — Other Ambulatory Visit: Payer: Self-pay

## 2020-09-04 DIAGNOSIS — Z01812 Encounter for preprocedural laboratory examination: Secondary | ICD-10-CM | POA: Diagnosis not present

## 2020-09-04 DIAGNOSIS — Z20822 Contact with and (suspected) exposure to covid-19: Secondary | ICD-10-CM | POA: Insufficient documentation

## 2020-09-04 LAB — SARS CORONAVIRUS 2 (TAT 6-24 HRS): SARS Coronavirus 2: NEGATIVE

## 2020-09-06 ENCOUNTER — Ambulatory Visit: Payer: Medicare HMO | Admitting: Anesthesiology

## 2020-09-06 ENCOUNTER — Ambulatory Visit
Admission: RE | Admit: 2020-09-06 | Discharge: 2020-09-06 | Disposition: A | Discharge status: Home / Self Care | Payer: Medicare HMO | Attending: General Surgery | Admitting: General Surgery

## 2020-09-06 ENCOUNTER — Other Ambulatory Visit: Payer: Self-pay

## 2020-09-06 ENCOUNTER — Encounter: Payer: Self-pay | Admitting: General Surgery

## 2020-09-06 ENCOUNTER — Encounter
Admission: RE | Disposition: A | Discharge status: Home / Self Care | Payer: Self-pay | Source: Home / Self Care | Attending: General Surgery

## 2020-09-06 DIAGNOSIS — Z9079 Acquired absence of other genital organ(s): Secondary | ICD-10-CM | POA: Insufficient documentation

## 2020-09-06 DIAGNOSIS — Z803 Family history of malignant neoplasm of breast: Secondary | ICD-10-CM | POA: Insufficient documentation

## 2020-09-06 DIAGNOSIS — I10 Essential (primary) hypertension: Secondary | ICD-10-CM | POA: Insufficient documentation

## 2020-09-06 DIAGNOSIS — H409 Unspecified glaucoma: Secondary | ICD-10-CM | POA: Insufficient documentation

## 2020-09-06 DIAGNOSIS — Z79899 Other long term (current) drug therapy: Secondary | ICD-10-CM | POA: Insufficient documentation

## 2020-09-06 DIAGNOSIS — E785 Hyperlipidemia, unspecified: Secondary | ICD-10-CM | POA: Insufficient documentation

## 2020-09-06 DIAGNOSIS — Z7982 Long term (current) use of aspirin: Secondary | ICD-10-CM | POA: Insufficient documentation

## 2020-09-06 DIAGNOSIS — Z8546 Personal history of malignant neoplasm of prostate: Secondary | ICD-10-CM | POA: Diagnosis not present

## 2020-09-06 DIAGNOSIS — N400 Enlarged prostate without lower urinary tract symptoms: Secondary | ICD-10-CM | POA: Diagnosis not present

## 2020-09-06 DIAGNOSIS — Z8249 Family history of ischemic heart disease and other diseases of the circulatory system: Secondary | ICD-10-CM | POA: Diagnosis not present

## 2020-09-06 DIAGNOSIS — Z87891 Personal history of nicotine dependence: Secondary | ICD-10-CM | POA: Insufficient documentation

## 2020-09-06 DIAGNOSIS — Z86010 Personal history of colonic polyps: Secondary | ICD-10-CM | POA: Diagnosis not present

## 2020-09-06 DIAGNOSIS — Z8719 Personal history of other diseases of the digestive system: Secondary | ICD-10-CM

## 2020-09-06 DIAGNOSIS — Z8042 Family history of malignant neoplasm of prostate: Secondary | ICD-10-CM | POA: Diagnosis not present

## 2020-09-06 DIAGNOSIS — K4090 Unilateral inguinal hernia, without obstruction or gangrene, not specified as recurrent: Secondary | ICD-10-CM | POA: Diagnosis not present

## 2020-09-06 HISTORY — PX: INGUINAL HERNIA REPAIR: SHX194

## 2020-09-06 SURGERY — REPAIR, HERNIA, INGUINAL, ADULT
Anesthesia: General | Laterality: Right

## 2020-09-06 MED ORDER — GABAPENTIN 300 MG PO CAPS
ORAL_CAPSULE | ORAL | Status: AC
  Administered 2020-09-06: 300 mg via ORAL
  Filled 2020-09-06: qty 1

## 2020-09-06 MED ORDER — CHLORHEXIDINE GLUCONATE CLOTH 2 % EX PADS: 6.0000 | MEDICATED_PAD | Freq: Once | CUTANEOUS | Status: DC

## 2020-09-06 MED ORDER — FENTANYL CITRATE (PF) 100 MCG/2ML IJ SOLN
INTRAMUSCULAR | Status: DC | PRN
  Administered 2020-09-06 (×2): 50 ug via INTRAVENOUS

## 2020-09-06 MED ORDER — CEFAZOLIN SODIUM-DEXTROSE 2-4 GM/100ML-% IV SOLN
INTRAVENOUS | Status: AC
  Filled 2020-09-06: qty 100

## 2020-09-06 MED ORDER — FENTANYL CITRATE (PF) 100 MCG/2ML IJ SOLN
25.0000 ug | INTRAMUSCULAR | Status: DC | PRN
  Administered 2020-09-06: 25 ug via INTRAVENOUS

## 2020-09-06 MED ORDER — IBUPROFEN 800 MG PO TABS: 800.0000 mg | ORAL_TABLET | Freq: Three times a day (TID) | ORAL | 0 refills | Status: DC | PRN

## 2020-09-06 MED ORDER — BUPIVACAINE LIPOSOME 1.3 % IJ SUSP
INTRAMUSCULAR | Status: DC | PRN
  Administered 2020-09-06: 20 mL

## 2020-09-06 MED ORDER — PHENYLEPHRINE HCL (PRESSORS) 10 MG/ML IV SOLN
INTRAVENOUS | Status: AC
  Filled 2020-09-06: qty 1

## 2020-09-06 MED ORDER — HYDROCODONE-ACETAMINOPHEN 5-325 MG PO TABS: 1.0000 | ORAL_TABLET | Freq: Four times a day (QID) | ORAL | 0 refills | Status: DC | PRN

## 2020-09-06 MED ORDER — BUPIVACAINE HCL (PF) 0.25 % IJ SOLN
INTRAMUSCULAR | Status: AC
  Filled 2020-09-06: qty 30

## 2020-09-06 MED ORDER — SUGAMMADEX SODIUM 500 MG/5ML IV SOLN
INTRAVENOUS | Status: DC | PRN
  Administered 2020-09-06: 300 mg via INTRAVENOUS

## 2020-09-06 MED ORDER — ACETAMINOPHEN 500 MG PO TABS: 1000.0000 mg | ORAL_TABLET | ORAL | Status: AC

## 2020-09-06 MED ORDER — LIDOCAINE-EPINEPHRINE 1 %-1:100000 IJ SOLN
INTRAMUSCULAR | Status: DC | PRN
  Administered 2020-09-06: 10 mL via INTRAMUSCULAR

## 2020-09-06 MED ORDER — SUCCINYLCHOLINE CHLORIDE 20 MG/ML IJ SOLN
INTRAMUSCULAR | Status: DC | PRN
  Administered 2020-09-06: 120 mg via INTRAVENOUS

## 2020-09-06 MED ORDER — ONDANSETRON HCL 4 MG/2ML IJ SOLN: 4.0000 mg | Freq: Once | INTRAMUSCULAR | Status: DC | PRN

## 2020-09-06 MED ORDER — LACTATED RINGERS IV SOLN: INTRAVENOUS | Status: DC

## 2020-09-06 MED ORDER — DEXAMETHASONE SODIUM PHOSPHATE 10 MG/ML IJ SOLN
INTRAMUSCULAR | Status: AC
  Filled 2020-09-06: qty 1

## 2020-09-06 MED ORDER — CHLORHEXIDINE GLUCONATE 0.12 % MT SOLN: 15.0000 mL | Freq: Once | OROMUCOSAL | Status: AC

## 2020-09-06 MED ORDER — GABAPENTIN 300 MG PO CAPS: 300.0000 mg | ORAL_CAPSULE | ORAL | Status: AC

## 2020-09-06 MED ORDER — FAMOTIDINE 20 MG PO TABS: 20.0000 mg | ORAL_TABLET | Freq: Once | ORAL | Status: AC

## 2020-09-06 MED ORDER — PHENYLEPHRINE HCL (PRESSORS) 10 MG/ML IV SOLN
INTRAVENOUS | Status: DC | PRN
  Administered 2020-09-06 (×3): 100 ug via INTRAVENOUS
  Administered 2020-09-06: 200 ug via INTRAVENOUS

## 2020-09-06 MED ORDER — FAMOTIDINE 20 MG PO TABS
ORAL_TABLET | ORAL | Status: AC
  Administered 2020-09-06: 20 mg via ORAL
  Filled 2020-09-06: qty 1

## 2020-09-06 MED ORDER — DEXAMETHASONE SODIUM PHOSPHATE 10 MG/ML IJ SOLN
INTRAMUSCULAR | Status: DC | PRN
  Administered 2020-09-06: 4 mg via INTRAVENOUS

## 2020-09-06 MED ORDER — ONDANSETRON HCL 4 MG/2ML IJ SOLN
INTRAMUSCULAR | Status: DC | PRN
  Administered 2020-09-06: 4 mg via INTRAVENOUS

## 2020-09-06 MED ORDER — LIDOCAINE HCL (CARDIAC) PF 100 MG/5ML IV SOSY
PREFILLED_SYRINGE | INTRAVENOUS | Status: DC | PRN
  Administered 2020-09-06: 80 mg via INTRAVENOUS

## 2020-09-06 MED ORDER — ROCURONIUM BROMIDE 10 MG/ML (PF) SYRINGE
PREFILLED_SYRINGE | INTRAVENOUS | Status: AC
  Filled 2020-09-06: qty 10

## 2020-09-06 MED ORDER — PROPOFOL 10 MG/ML IV BOLUS
INTRAVENOUS | Status: DC | PRN
  Administered 2020-09-06: 150 mg via INTRAVENOUS

## 2020-09-06 MED ORDER — CEFAZOLIN SODIUM-DEXTROSE 2-4 GM/100ML-% IV SOLN
2.0000 g | INTRAVENOUS | Status: AC
  Administered 2020-09-06: 2 g via INTRAVENOUS

## 2020-09-06 MED ORDER — BUPIVACAINE LIPOSOME 1.3 % IJ SUSP: 20.0000 mL | Freq: Once | INTRAMUSCULAR | Status: DC

## 2020-09-06 MED ORDER — SUCCINYLCHOLINE CHLORIDE 200 MG/10ML IV SOSY
PREFILLED_SYRINGE | INTRAVENOUS | Status: AC
  Filled 2020-09-06: qty 10

## 2020-09-06 MED ORDER — MEPERIDINE HCL 50 MG/ML IJ SOLN: 6.2500 mg | INTRAMUSCULAR | Status: DC | PRN

## 2020-09-06 MED ORDER — ROCURONIUM BROMIDE 100 MG/10ML IV SOLN
INTRAVENOUS | Status: DC | PRN
  Administered 2020-09-06: 65 mg via INTRAVENOUS
  Administered 2020-09-06: 5 mg via INTRAVENOUS

## 2020-09-06 MED ORDER — FENTANYL CITRATE (PF) 250 MCG/5ML IJ SOLN
INTRAMUSCULAR | Status: AC
  Filled 2020-09-06: qty 5

## 2020-09-06 MED ORDER — FENTANYL CITRATE (PF) 100 MCG/2ML IJ SOLN
INTRAMUSCULAR | Status: AC
  Administered 2020-09-06: 25 ug via INTRAVENOUS
  Filled 2020-09-06: qty 2

## 2020-09-06 MED ORDER — PROPOFOL 500 MG/50ML IV EMUL
INTRAVENOUS | Status: AC
  Filled 2020-09-06: qty 50

## 2020-09-06 MED ORDER — ONDANSETRON HCL 4 MG/2ML IJ SOLN
INTRAMUSCULAR | Status: AC
  Filled 2020-09-06: qty 2

## 2020-09-06 MED ORDER — LIDOCAINE-EPINEPHRINE 1 %-1:100000 IJ SOLN
INTRAMUSCULAR | Status: AC
  Filled 2020-09-06: qty 1

## 2020-09-06 MED ORDER — ACETAMINOPHEN 500 MG PO TABS
ORAL_TABLET | ORAL | Status: AC
  Administered 2020-09-06: 1000 mg via ORAL
  Filled 2020-09-06: qty 2

## 2020-09-06 MED ORDER — CHLORHEXIDINE GLUCONATE 0.12 % MT SOLN
OROMUCOSAL | Status: AC
  Administered 2020-09-06: 15 mL via OROMUCOSAL
  Filled 2020-09-06: qty 15

## 2020-09-06 MED ORDER — BUPIVACAINE LIPOSOME 1.3 % IJ SUSP
INTRAMUSCULAR | Status: AC
  Filled 2020-09-06: qty 20

## 2020-09-06 MED ORDER — ORAL CARE MOUTH RINSE: 15.0000 mL | Freq: Once | OROMUCOSAL | Status: AC

## 2020-09-06 SURGICAL SUPPLY — 42 items
ADH SKN CLS APL DERMABOND .7 (GAUZE/BANDAGES/DRESSINGS) ×1
APL PRP STRL LF DISP 70% ISPRP (MISCELLANEOUS) ×1
BLADE CLIPPER SURG (BLADE) ×2 IMPLANT
CHLORAPREP W/TINT 26 (MISCELLANEOUS) ×2 IMPLANT
COVER WAND RF STERILE (DRAPES) ×2 IMPLANT
DERMABOND ADVANCED (GAUZE/BANDAGES/DRESSINGS) ×1
DERMABOND ADVANCED .7 DNX12 (GAUZE/BANDAGES/DRESSINGS) ×1 IMPLANT
DRAIN PENROSE 5/8X18 LTX STRL (DRAIN) ×2 IMPLANT
DRAPE LAPAROTOMY 77X122 PED (DRAPES) ×2 IMPLANT
ELECT CAUTERY BLADE TIP 2.5 (TIP) ×2
ELECT REM PT RETURN 9FT ADLT (ELECTROSURGICAL) ×2
ELECTRODE CAUTERY BLDE TIP 2.5 (TIP) ×1 IMPLANT
ELECTRODE REM PT RTRN 9FT ADLT (ELECTROSURGICAL) ×1 IMPLANT
GLOVE SURG ENC MOIS LTX SZ6.5 (GLOVE) ×2 IMPLANT
GLOVE SURG UNDER LTX SZ7 (GLOVE) ×4 IMPLANT
GOWN STRL REUS W/ TWL LRG LVL3 (GOWN DISPOSABLE) ×2 IMPLANT
GOWN STRL REUS W/TWL LRG LVL3 (GOWN DISPOSABLE) ×4
KIT TURNOVER KIT A (KITS) ×2 IMPLANT
LABEL OR SOLS (LABEL) ×2 IMPLANT
MANIFOLD NEPTUNE II (INSTRUMENTS) ×2 IMPLANT
MESH HERNIA SYS ULTRAPRO LRG (Mesh General) IMPLANT
MESH HERNIA ULTRAPRO MED (Mesh General) IMPLANT
MESH PARIETEX PROGRIP LEFT (Mesh General) IMPLANT
MESH PARIETEX PROGRIP RIGHT (Mesh General) ×2 IMPLANT
NEEDLE HYPO 22GX1.5 SAFETY (NEEDLE) ×4 IMPLANT
NS IRRIG 500ML POUR BTL (IV SOLUTION) ×2 IMPLANT
PACK BASIN MINOR ARMC (MISCELLANEOUS) ×2 IMPLANT
SPONGE KITTNER 5P (MISCELLANEOUS) ×2 IMPLANT
SPONGE LAP 18X18 RF (DISPOSABLE) ×2 IMPLANT
STRIP CLOSURE SKIN 1/2X4 (GAUZE/BANDAGES/DRESSINGS) ×2 IMPLANT
SUT ETHIBOND NAB MO 7 #0 18IN (SUTURE) IMPLANT
SUT MNCRL 4-0 (SUTURE) ×2
SUT MNCRL 4-0 27XMFL (SUTURE) ×1
SUT PROLENE 2 0 SH DA (SUTURE) IMPLANT
SUT VIC AB 2-0 SH 27 (SUTURE)
SUT VIC AB 2-0 SH 27XBRD (SUTURE) IMPLANT
SUT VIC AB 3-0 SH 27 (SUTURE) ×4
SUT VIC AB 3-0 SH 27X BRD (SUTURE) ×2 IMPLANT
SUTURE MNCRL 4-0 27XMF (SUTURE) ×1 IMPLANT
SYR 10ML LL (SYRINGE) ×2 IMPLANT
SYR 20ML LL LF (SYRINGE) ×2 IMPLANT
SYR BULB IRRIG 60ML STRL (SYRINGE) ×2 IMPLANT

## 2020-09-06 NOTE — Anesthesia Postprocedure Evaluation (Signed)
Anesthesia Post Note  Patient: Ernest Sweeney  Procedure(s) Performed: HERNIA REPAIR RIGH  INGUINAL WITH MESH ADULT, open with RNFA to assist (Right )  Patient location during evaluation: PACU Anesthesia Type: General Level of consciousness: awake and alert, awake and oriented Pain management: pain level controlled Vital Signs Assessment: post-procedure vital signs reviewed and stable Respiratory status: spontaneous breathing, nonlabored ventilation and respiratory function stable Cardiovascular status: blood pressure returned to baseline and stable Postop Assessment: no apparent nausea or vomiting Anesthetic complications: no   No complications documented.   Last Vitals:  Vitals:   09/06/20 0945 09/06/20 0957  BP: 108/82 110/73  Pulse: 82 76  Resp: (!) 22 18  Temp: (!) 36.3 C   SpO2: 99% 100%    Last Pain:  Vitals:   09/06/20 0957  TempSrc: Temporal  PainSc: 1                  Phill Mutter

## 2020-09-06 NOTE — Transfer of Care (Signed)
Immediate Anesthesia Transfer of Care Note  Patient: Ernest Sweeney  Procedure(s) Performed: HERNIA REPAIR RIGH  INGUINAL WITH MESH ADULT, open with RNFA to assist (Right )  Patient Location: PACU  Anesthesia Type:General  Level of Consciousness: awake, alert  and oriented  Airway & Oxygen Therapy: Patient Spontanous Breathing and Patient connected to face mask oxygen  Post-op Assessment: Report given to RN and Post -op Vital signs reviewed and stable  Post vital signs: Reviewed and stable  Last Vitals:  Vitals Value Taken Time  BP 108/82 09/06/20 0904  Temp 36.2 C 09/06/20 0904  Pulse 87 09/06/20 0913  Resp 15 09/06/20 0913  SpO2 100 % 09/06/20 0913  Vitals shown include unvalidated device data.  Last Pain:  Vitals:   09/06/20 0904  TempSrc:   PainSc: Asleep      Patients Stated Pain Goal: 0 (21/82/88 3374)  Complications: No complications documented.

## 2020-09-06 NOTE — Anesthesia Preprocedure Evaluation (Signed)
Anesthesia Evaluation  Patient identified by MRN, date of birth, ID band Patient awake    Reviewed: Allergy & Precautions, H&P , NPO status , Patient's Chart, lab work & pertinent test results  History of Anesthesia Complications Negative for: history of anesthetic complications  Airway Mallampati: II  TM Distance: >3 FB Neck ROM: full    Dental  (+) Missing, Poor Dentition   Pulmonary neg pulmonary ROS, former smoker,    Pulmonary exam normal breath sounds clear to auscultation       Cardiovascular Exercise Tolerance: Good hypertension, Pt. on medications (-) angina(-) CAD, (-) Past MI, (-) Cardiac Stents and (-) CABG Normal cardiovascular exam(-) dysrhythmias (-) Valvular Problems/Murmurs Rhythm:regular Rate:Normal     Neuro/Psych PSYCHIATRIC DISORDERS Anxiety Depression negative neurological ROS     GI/Hepatic Neg liver ROS, GERD  ,  Endo/Other  negative endocrine ROS  Renal/GU negative Renal ROS  negative genitourinary   Musculoskeletal  (+) Arthritis ,   Abdominal   Peds  Hematology negative hematology ROS (+)   Anesthesia Other Findings . Anxiety   Panic attacks in the past . Arthritis  . Benign prostatic hypertrophy without lower urinary tract symptoms  . Cancer [facility])   Prostate . Colon polyp  . GERD (gastroesophageal reflux disease)  . Glaucoma (increased eye pressure)   Left eye only . Hyperlipidemia  . Hypertension     Reproductive/Obstetrics negative OB ROS                            Anesthesia Physical  Anesthesia Plan  ASA: II  Anesthesia Plan: General   Post-op Pain Management:    Induction: Intravenous  PONV Risk Score and Plan: 2 and Ondansetron  Airway Management Planned: Oral ETT  Additional Equipment:   Intra-op Plan:   Post-operative Plan:   Informed Consent: I have reviewed the patients History and Physical, chart, labs and  discussed the procedure including the risks, benefits and alternatives for the proposed anesthesia with the patient or authorized representative who has indicated his/her understanding and acceptance.     Dental Advisory Given  Plan Discussed with: Anesthesiologist, CRNA and Surgeon  Anesthesia Plan Comments:        Anesthesia Quick Evaluation

## 2020-09-06 NOTE — Op Note (Signed)
Hernia, Open, Procedure Note  Indications: The patient presented with a history of a right, reducible hernia.    Pre-operative Diagnosis: right reducible indirect inguinal hernia  Post-operative Diagnosis: same  Surgeon: Fredirick Maudlin   Assistants: Armida Sans, RNFA  Anesthesia: General endotracheal anesthesia  Procedure Details  The patient was seen again in the Holding Room. The risks, benefits, complications, treatment options, and expected outcomes were discussed with the patient. The possibilities of reaction to medication, pulmonary aspiration, perforation of viscus, bleeding, recurrent infection, the need for additional procedures, and development of a complication requiring transfusion or further operation were discussed with the patient and/or family. There was concurrence with the proposed plan, and informed consent was obtained. The site of surgery was properly noted/marked. The patient was taken to the Operating Room, identified as Ernest Sweeney, and the procedure verified as hernia repair. A Time Out was held and the above information confirmed.  The patient was placed in the supine position and underwent induction of anesthesia. The lower abdomen and groin was prepped and draped in the standard fashion, and a one-to-one mixture of 0.25% bupivacaine and 1% lidocaine with epinephrine was used to anesthetize the skin over the mid-portion of the inguinal canal. A transverse incision was made. Dissection was carried through the soft tissue to expose the inguinal canal and inguinal ligament along its lower edge. The external oblique fascia was split along the course of its fibers, exposing the inguinal canal. The cord and nerve were looped using a Penrose drain and reflected out of the field.  There was a small cord lipoma as well as a small indirect defect.  The cord lipoma was excised and the sac was carefully dissected away from the cord structures.  The sac was opened and it was  confirmed that there were no intra-abdominal contents present within the sac.  It was suture ligated and excised, then reduced back into the abdominal cavity.  A right sided progrip mesh was trimmed to an appropriate size.  It was secured to the pubic tubercle with a 0 Ethibond suture.  The mesh was then laid along the conjoined tendon medially and the inguinal ligament laterally.  The cord structures were encircled within the split and the tab of the mesh secured to itself. The contents were then returned to canal.  The external oblique fascia was then closed with running 2-0 Vicryl, taking care not to impinge upon the nerve.  Scarpa's fascia was closed with running 3-0 Vicryl.  Deep dermal 3-0 Vicryl sutures were used to approximate that layer and the skin was closed with running subcuticular Monocryl.  The skin was cleaned.  Dermabond and Steri-Strips were applied.  The patient was awakened, extubated, and taken to the postanesthesia care unit in good condition.  Instrument, sponge, and needle counts were correct prior to closure and at the conclusion of the case.  Findings: Hernia as above  Estimated Blood Loss: Minimal         Drains: None         Total IV Fluids: See anesthesia record          Specimens: None               Complications: None; patient tolerated the procedure well.         Disposition: PACU, with plan to discharge home when appropriate         Condition: stable

## 2020-09-06 NOTE — Discharge Instructions (Addendum)
Bupivacaine Liposomal Suspension for Injection     WEAR GREEN BRACELET FOR 3 DAYS  What is this medicine? BUPIVACAINE LIPOSOMAL (bue PIV a kane LIP oh som al) is an anesthetic. It causes loss of feeling in the skin or other tissues. It is used to prevent and to treat pain from some procedures. This medicine may be used for other purposes; ask your health care provider or pharmacist if you have questions. COMMON BRAND NAME(S): EXPAREL What should I tell my health care provider before I take this medicine? They need to know if you have any of these conditions:  G6PD deficiency  heart disease  kidney disease  liver disease  low blood pressure  lung or breathing disease, like asthma  an unusual or allergic reaction to bupivacaine, other medicines, foods, dyes, or preservatives  pregnant or trying to get pregnant  breast-feeding How should I use this medicine? This medicine is injected into the affected area. It is given by a health care provider in a hospital or clinic setting. Talk to your health care provider about the use of this medicine in children. While it may be given to children as young as 6 years for selected conditions, precautions do apply. Overdosage: If you think you have taken too much of this medicine contact a poison control center or emergency room at once. NOTE: This medicine is only for you. Do not share this medicine with others. What if I miss a dose? This does not apply. What may interact with this medicine? This medicine may interact with the following medications:  acetaminophen  certain antibiotics like dapsone, nitrofurantoin, aminosalicylic acid, sulfonamides  certain medicines for seizures like phenobarbital, phenytoin, valproic acid  chloroquine  cyclophosphamide  flutamide  hydroxyurea  ifosfamide  metoclopramide  nitric oxide  nitroglycerin  nitroprusside  nitrous oxide  other local anesthetics like lidocaine, pramoxine,  tetracaine  primaquine  quinine  rasburicase  sulfasalazine This list may not describe all possible interactions. Give your health care provider a list of all the medicines, herbs, non-prescription drugs, or dietary supplements you use. Also tell them if you smoke, drink alcohol, or use illegal drugs. Some items may interact with your medicine. What should I watch for while using this medicine? Your condition will be monitored carefully while you are receiving this medicine. Be careful to avoid injury while the area is numb, and you are not aware of pain. What side effects may I notice from receiving this medicine? Side effects that you should report to your doctor or health care professional as soon as possible:  allergic reactions like skin rash, itching or hives, swelling of the face, lips, or tongue  seizures  signs and symptoms of a dangerous change in heartbeat or heart rhythm like chest pain; dizziness; fast, irregular heartbeat; palpitations; feeling faint or lightheaded; falls; breathing problems  signs and symptoms of methemoglobinemia such as pale, gray, or blue colored skin; headache; fast heartbeat; shortness of breath; feeling faint or lightheaded, falls; tiredness Side effects that usually do not require medical attention (report to your doctor or health care professional if they continue or are bothersome):  anxious  back pain  changes in taste  changes in vision  constipation  dizziness  fever  nausea, vomiting This list may not describe all possible side effects. Call your doctor for medical advice about side effects. You may report side effects to FDA at 1-800-FDA-1088. Where should I keep my medicine? This drug is given in a hospital or clinic and   will not be stored at home. NOTE: This sheet is a summary. It may not cover all possible information. If you have questions about this medicine, talk to your doctor, pharmacist, or health care provider.  2021  Elsevier/Gold Standard (2019-10-05 12:24:57)   AMBULATORY SURGERY  DISCHARGE INSTRUCTIONS   1) The drugs that you were given will stay in your system until tomorrow so for the next 24 hours you should not:  A) Drive an automobile B) Make any legal decisions C) Drink any alcoholic beverage   2) You may resume regular meals tomorrow.  Today it is better to start with liquids and gradually work up to solid foods.  You may eat anything you prefer, but it is better to start with liquids, then soup and crackers, and gradually work up to solid foods.   3) Please notify your doctor immediately if you have any unusual bleeding, trouble breathing, redness and pain at the surgery site, drainage, fever, or pain not relieved by medication.    4) Additional Instructions:        Please contact your physician with any problems or Same Day Surgery at [phone], Monday through Friday 6 am to 4 pm, or [place] [facility] [place] Main number at [phone]. 

## 2020-09-06 NOTE — Anesthesia Procedure Notes (Signed)
Procedure Name: Intubation Date/Time: 09/06/2020 7:35 AM Performed by: Johney Maine, CRNA Pre-anesthesia Checklist: Patient identified, Patient being monitored, Timeout performed, Emergency Drugs available and Suction available Patient Re-evaluated:Patient Re-evaluated prior to induction Oxygen Delivery Method: Circle system utilized Preoxygenation: Pre-oxygenation with 100% oxygen Induction Type: IV induction Ventilation: Mask ventilation without difficulty Laryngoscope Size: Miller and 2 Grade View: Grade II Tube type: Oral Tube size: 7.5 mm Number of attempts: 1 Airway Equipment and Method: Stylet Placement Confirmation: ETT inserted through vocal cords under direct vision,  positive ETCO2 and breath sounds checked- equal and bilateral Secured at: 22 cm Tube secured with: Tape Dental Injury: Teeth and Oropharynx as per pre-operative assessment

## 2020-09-06 NOTE — Interval H&P Note (Signed)
History and Physical Interval Note:  09/06/2020 7:12 AM  Ernest Sweeney  has presented today for surgery, with the diagnosis of right inguinal hernia.  The various methods of treatment have been discussed with the patient and family. After consideration of risks, benefits and other options for treatment, the patient has consented to  Procedure(s) with comments: [place], open with RNFA to assist (Right) - Provider requesting 2 hours/120 minutes for procedure. as a surgical intervention.  The patient's history has been reviewed, patient examined, no change in status, stable for surgery.  I have reviewed the patient's chart and labs.  Questions were answered to the patient's satisfaction.     Fredirick Maudlin

## 2020-09-11 ENCOUNTER — Telehealth: Payer: Self-pay | Admitting: *Deleted

## 2020-09-11 NOTE — Telephone Encounter (Signed)
Patient scheduled for chest CT 10/03/2020 at 2:45PM no change in insurance patient quit smoking in 2016.

## 2020-09-12 ENCOUNTER — Other Ambulatory Visit: Payer: Self-pay | Admitting: *Deleted

## 2020-09-12 ENCOUNTER — Other Ambulatory Visit: Payer: Self-pay

## 2020-09-12 ENCOUNTER — Other Ambulatory Visit: Payer: Medicare HMO

## 2020-09-12 DIAGNOSIS — C61 Malignant neoplasm of prostate: Secondary | ICD-10-CM | POA: Diagnosis not present

## 2020-09-12 DIAGNOSIS — Z122 Encounter for screening for malignant neoplasm of respiratory organs: Secondary | ICD-10-CM

## 2020-09-12 DIAGNOSIS — Z87891 Personal history of nicotine dependence: Secondary | ICD-10-CM

## 2020-09-12 NOTE — Progress Notes (Signed)
Contacted and scheduled for annual lung screening scan. Patient is a former smoker, record review shows quit date 03/23/16, 30 pack year history.

## 2020-09-13 LAB — PSA: Prostate Specific Ag, Serum: <0.1 ng/mL (ref 0.0–4.0)

## 2020-09-19 ENCOUNTER — Ambulatory Visit (INDEPENDENT_AMBULATORY_CARE_PROVIDER_SITE_OTHER): Payer: Medicare HMO | Admitting: Urology

## 2020-09-19 ENCOUNTER — Encounter: Payer: Self-pay | Admitting: Urology

## 2020-09-19 ENCOUNTER — Other Ambulatory Visit: Payer: Self-pay

## 2020-09-19 VITALS — BP 116/81 | HR 103 | Ht 69.0 in | Wt 180.0 lb

## 2020-09-19 DIAGNOSIS — C61 Malignant neoplasm of prostate: Secondary | ICD-10-CM

## 2020-09-19 DIAGNOSIS — N529 Male erectile dysfunction, unspecified: Secondary | ICD-10-CM | POA: Diagnosis not present

## 2020-09-19 NOTE — Progress Notes (Signed)
   09/19/2020 10:02 AM   [name] [birth year] [id number]  Reason for visit: Follow up prostate cancer  HPI: I saw Mr. Mallek in urology clinic today for prostate cancer follow-up. To briefly summarize, he is a healthy 70 year old African-American male that underwent a robotic prostatectomy and bilateral lymphadenectomy in January 2018 with Dr. Pilar Jarvis. Final pathology showed Gleason score 3+4 = 7 prostate cancer stage pT3aN0 Mx, therewasfocal extraprostatic extension and perineural invasion, margins were negative. Pretreatment PSA was 6, and PSA after surgery was undetectable.  He developed biochemical recurrence in August 2019 with a PSA of 0.2x2,and underwent salvage radiation to the prostatic fossa and 6 months of ADT(03/08/2018).  His most recent PSA3/3/22 remains undetectable.  Overall he continues to do well.Minimally bothersome stress urinary incontinence.  He will also have some urgency when he drinks a lot of diet drinks or sodas.  He is able to get erections with 100 mg sildenafil on demand  We had a long conversation again about biochemical recurrence after prostatectomy, and the need for close PSA surveillance after salvage radiation. We also reviewed the plan of monitoring the PSA, with plan for resuming ADT in the future if the PSA were to increase.  RTC 1 year for PSA Continue Viagra 100 mg as needed for ED   Billey Co, MD  [place] [address] [place], [place] [address] [phone]

## 2020-09-25 ENCOUNTER — Encounter: Payer: Self-pay | Admitting: Physician Assistant

## 2020-09-25 ENCOUNTER — Ambulatory Visit (INDEPENDENT_AMBULATORY_CARE_PROVIDER_SITE_OTHER): Payer: Medicare HMO | Admitting: Physician Assistant

## 2020-09-25 ENCOUNTER — Other Ambulatory Visit: Payer: Self-pay

## 2020-09-25 VITALS — BP 122/84 | HR 87 | Temp 97.7°F | Ht 69.0 in | Wt 180.0 lb

## 2020-09-25 DIAGNOSIS — K4090 Unilateral inguinal hernia, without obstruction or gangrene, not specified as recurrent: Secondary | ICD-10-CM

## 2020-09-25 DIAGNOSIS — Z09 Encounter for follow-up examination after completed treatment for conditions other than malignant neoplasm: Secondary | ICD-10-CM

## 2020-09-25 NOTE — Patient Instructions (Addendum)
   Please call our office if you have any questions or concerns.   GENERAL POST-OPERATIVE PATIENT INSTRUCTIONS   WOUND CARE INSTRUCTIONS:  Keep a dry clean dressing on the wound if there is drainage. The initial bandage may be removed after 24 hours.  Once the wound has quit draining you may leave it open to air.  If clothing rubs against the wound or causes irritation and the wound is not draining you may cover it with a dry dressing during the daytime.  Try to keep the wound dry and avoid ointments on the wound unless directed to do so.  If the wound becomes bright red and painful or starts to drain infected material that is not clear, please contact your physician immediately.  If the wound is mildly pink and has a thick firm ridge underneath it, this is normal, and is referred to as a healing ridge.  This will resolve over the next 4-6 weeks.  BATHING: You may shower if you have been informed of this by your surgeon. However, Please do not submerge in a tub, hot tub, or pool until incisions are completely sealed or have been told by your surgeon that you may do so.  DIET:  You may eat any foods that you can tolerate.  It is a good idea to eat a high fiber diet and take in plenty of fluids to prevent constipation.  If you do become constipated you may want to take a mild laxative or take ducolax tablets on a daily basis until your bowel habits are regular.  Constipation can be very uncomfortable, along with straining, after recent surgery.  ACTIVITY:  You are encouraged to cough and deep breath or use your incentive spirometer if you were given one, every 15-30 minutes when awake.  This will help prevent respiratory complications and low grade fevers post-operatively if you had a general anesthetic.  You may want to hug a pillow when coughing and sneezing to add additional support to the surgical area, if you had abdominal or chest surgery, which will decrease pain during these times.  You are  encouraged to walk and engage in light activity for the next two weeks.  You should not lift more than 20 pounds for 6 weeks after surgery as it could put you at increased risk for complications.  Twenty pounds is roughly equivalent to a plastic bag of groceries. At that time- Listen to your body when lifting, if you have pain when lifting, stop and then try again in a few days. Soreness after doing exercises or activities of daily living is normal as you get back in to your normal routine.  MEDICATIONS:  Try to take narcotic medications and anti-inflammatory medications, such as tylenol, ibuprofen, naprosyn, etc., with food.  This will minimize stomach upset from the medication.  Should you develop nausea and vomiting from the pain medication, or develop a rash, please discontinue the medication and contact your physician.  You should not drive, make important decisions, or operate machinery when taking narcotic pain medication.  SUNBLOCK Use sun block to incision area over the next year if this area will be exposed to sun. This helps decrease scarring and will allow you avoid a permanent darkened area over your incision.  QUESTIONS:  Please feel free to call our office if you have any questions, and we will be glad to assist you.

## 2020-09-25 NOTE — Progress Notes (Signed)
[facility] POST-OP OFFICE VISIT  09/25/2020  HPI: Ernest Sweeney is a 70 y.o. male 19 days s/p right inguinal hernia with Dr Celine Ahr.   He is overall doing well  Still having some soreness in his right groin but no scrotal swelling No fever, chills, nausea, emesis, or bowel changes Incision is healing well No other complaints.   Vital signs: BP 122/84   Pulse 87   Temp 97.7 F (36.5 C)   Ht 5\' 9"  (1.753 m)   Wt 180 lb (81.6 kg)   SpO2 98%   BMI 26.58 kg/m    Physical Exam: Constitutional: Well appearing male, NAD Abdomen: Soft, non-tender, non-distended, no rebound/guarding Skin: Incision is well healed, no erythema or drainage. I removed his steri-strips  Assessment/Plan: This is a 70 y.o. male 19 days s/p right inguinal hernia   - Pain control prn with Tylenol/Motrin  - Reviewed lifting restrictions; offered work note but he declined  - Reviewed wound care  - He will follow up on as needed basis  -- Edison Simon, PA-C [facility] 09/25/2020, 9:58 AM [phone] M-F: 7am - 4pm

## 2020-10-03 ENCOUNTER — Ambulatory Visit
Admission: RE | Admit: 2020-10-03 | Discharge: 2020-10-03 | Disposition: A | Discharge status: Home / Self Care | Payer: Medicare HMO | Source: Ambulatory Visit | Attending: Nurse Practitioner | Admitting: Nurse Practitioner

## 2020-10-03 ENCOUNTER — Other Ambulatory Visit: Payer: Self-pay

## 2020-10-03 DIAGNOSIS — Z87891 Personal history of nicotine dependence: Secondary | ICD-10-CM

## 2020-10-03 DIAGNOSIS — Z122 Encounter for screening for malignant neoplasm of respiratory organs: Secondary | ICD-10-CM | POA: Diagnosis not present

## 2020-10-08 ENCOUNTER — Encounter: Payer: Self-pay | Admitting: *Deleted

## 2020-10-29 ENCOUNTER — Telehealth: Payer: Self-pay

## 2020-10-29 NOTE — Telephone Encounter (Signed)
  Chronic Care Management   Outreach Note  10/29/2020 Name: JACQUAN SAVAS MRN: [id number] DOB: [birth year]  Primary Care Provider: [name], MD Reason for referral : Chronic Care Management   An unsuccessful telephone outreach was attempted today. Mr. Waldschmidt is currently enrolled in the Chronic Care Management program.    Follow Up Plan:  A HIPAA compliant voice message was left today requesting a return call.    Cristy Friedlander [facility] Care Management [facility] [phone]

## 2020-11-13 DIAGNOSIS — I2510 Atherosclerotic heart disease of native coronary artery without angina pectoris: Secondary | ICD-10-CM | POA: Diagnosis not present

## 2020-11-13 DIAGNOSIS — E785 Hyperlipidemia, unspecified: Secondary | ICD-10-CM | POA: Diagnosis not present

## 2020-11-13 DIAGNOSIS — I10 Essential (primary) hypertension: Secondary | ICD-10-CM | POA: Diagnosis not present

## 2020-11-13 DIAGNOSIS — I208 Other forms of angina pectoris: Secondary | ICD-10-CM | POA: Diagnosis not present

## 2020-11-13 DIAGNOSIS — I700 Atherosclerosis of aorta: Secondary | ICD-10-CM | POA: Diagnosis not present

## 2020-11-13 DIAGNOSIS — J449 Chronic obstructive pulmonary disease, unspecified: Secondary | ICD-10-CM | POA: Diagnosis not present

## 2020-11-13 DIAGNOSIS — R0602 Shortness of breath: Secondary | ICD-10-CM | POA: Diagnosis not present

## 2020-11-14 ENCOUNTER — Ambulatory Visit (INDEPENDENT_AMBULATORY_CARE_PROVIDER_SITE_OTHER): Payer: Medicare HMO

## 2020-11-14 DIAGNOSIS — J432 Centrilobular emphysema: Secondary | ICD-10-CM

## 2020-11-14 DIAGNOSIS — I2510 Atherosclerotic heart disease of native coronary artery without angina pectoris: Secondary | ICD-10-CM

## 2020-11-14 DIAGNOSIS — E785 Hyperlipidemia, unspecified: Secondary | ICD-10-CM

## 2020-11-14 DIAGNOSIS — I2584 Coronary atherosclerosis due to calcified coronary lesion: Secondary | ICD-10-CM

## 2020-11-14 NOTE — Chronic Care Management (AMB) (Signed)
Chronic Care Management   Follow Up Note   11/14/2020 Name: Ernest Sweeney MRN: [id number] DOB: September 22, 1950  Primary Care Provider: [name], MD Reason for referral : Chronic Care Management   Ernest Sweeney is a 70 y.o. year old male who is a primary care patient of [name], MD. He is currently enrolled in the Chronic Care Management program. A routine telephone outreach was conducted today.  Review of Ernest Sweeney's status, including review of consultants reports, relevant labs and test results was conducted today. Collaboration with appropriate care team members was performed as part of the comprehensive evaluation and provision of chronic care management services.      Outpatient Encounter Medications as of 11/14/2020  Medication Sig  . aspirin 81 MG tablet Take 81 mg by mouth daily.  . brimonidine-timolol (COMBIGAN) 0.2-0.5 % ophthalmic solution Place 1 drop into both eyes every 12 (twelve) hours.  . Cholecalciferol (VITAMIN D) 2000 units CAPS Take 1 capsule (2,000 Units total) by mouth daily.  . fluticasone (FLONASE) 50 MCG/ACT nasal spray Place 2 sprays into both nostrils daily.  Marland Kitchen latanoprost (XALATAN) 0.005 % ophthalmic solution Place 1 drop into both eyes at bedtime.   Marland Kitchen lisinopril (ZESTRIL) 10 MG tablet Take 1-2 tablets (10-20 mg total) by mouth 2 (two) times daily. 20 mg in am and one in pm  . rosuvastatin (CRESTOR) 40 MG tablet Take 1 tablet (40 mg total) by mouth daily. In place of Atorvastatin  . sildenafil (VIAGRA) 100 MG tablet Take 1 tablet (100 mg total) by mouth daily as needed for erectile dysfunction.  . triamcinolone cream (KENALOG) 0.1 % APPLY  CREAM EXTERNALLY TWICE DAILY   No facility-administered encounter medications on file as of 11/14/2020.     Objective:   Patient Care Plan: Hypertension and Hyperlipidemia    Problem Identified: Hypertension and Hyperlipidemia Monitored     Long-Range Goal: Hypertension and Hyperlipidemia Monitored   Start  Date: 11/14/2020  Expected End Date: 02/12/2021  Note:   BP Readings from Last 3 Encounters:  09/25/20 122/84  09/19/20 116/81  09/06/20 119/87   Lab Results  Component Value Date   CHOL 150 05/02/2020   HDL 72 05/02/2020   LDLCALC 64 05/02/2020   TRIG 60 05/02/2020   CHOLHDL 2.1 05/02/2020     Current Barriers:  . Chronic Disease Management support and educational needs r/t Hypertension and Hyperlipidemia.  Case Manager Clinical Goal(s):  Marland Kitchen Over the next 120 days, patient will demonstrate improved adherence to prescribed treatment plan as evidenced by taking all medications as prescribed, monitoring and recording blood pressure, and adhering to a cardiac prudent/heart healthy diet.   Interventions: . Collaboration with [name], MD regarding development and update of comprehensive plan of care as evidenced by provider attestation and co-signature . Inter-disciplinary care team collaboration (see longitudinal plan of care) . Reviewed medications. Reports excellent compliance. Encouraged to continue taking as prescribed and notify a provider if unable to tolerate the prescribed regimen.  . Provided information regarding established blood pressure parameters along with indications for notifying a provider. Encouraged to monitor and record readings. . Discussed compliance with recommended cardiac prudent diet. Encouraged to read nutrition labels and avoid highly processed foods when possible. Encouraged to update the care management team with concerns regarding nutritional resources. . Reviewed complications of uncontrolled blood pressure. Reviewed s/sx of heart attack, stroke and worsening symptoms that require immediate medical attention.    Patient Goals/Self-Care Activities: -Self-administer medications as prescribed -Attend all scheduled provider  appointments -Monitor and record blood pressure -Adhere to recommended cardiac prudent/heart healthy diet -Notify provider or  care management team with questions and new concerns as needed   Follow Up Plan:  Will follow up next month      PLAN A member of the care management team will follow up with Ernest Sweeney within the next month.    Cristy Friedlander [facility] Care Management [facility] [phone]

## 2020-11-17 IMAGING — CT CT CHEST LUNG CANCER SCREENING LOW DOSE W/O CM
2 of 5 series · 15 of 40 positions shown, 18 images · non-contrast
Comparison: 04/20/2017.

CLINICAL DATA: Former smoker, quit 2+ years ago, 30 pack-year
history. Prostate cancer.

EXAM:
CT CHEST WITHOUT CONTRAST LOW-DOSE FOR LUNG CANCER SCREENING
TECHNIQUE: Multidetector CT imaging of the chest was performed following the
standard protocol without IV contrast.

[Series 3: lung · axial · 0.74mm/px · z∈[-1422,-1098]mm · 12 of 360 slices shown, 15 images (1 of 2)]
[im 18/360  mediastinal]
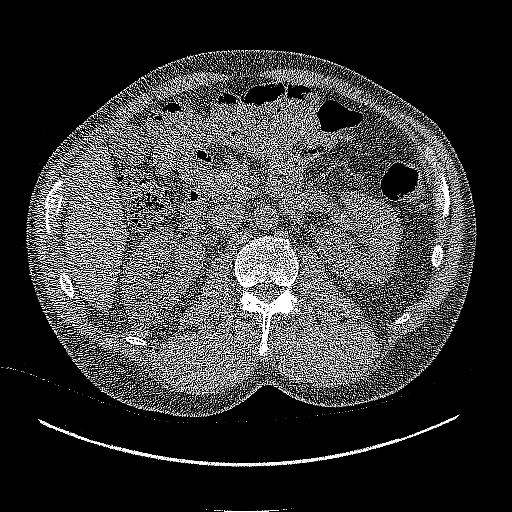
[im 18/360  lung]
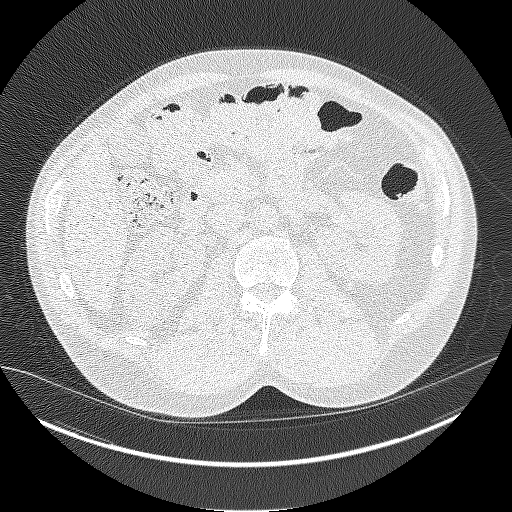
[im 54/360  lung]
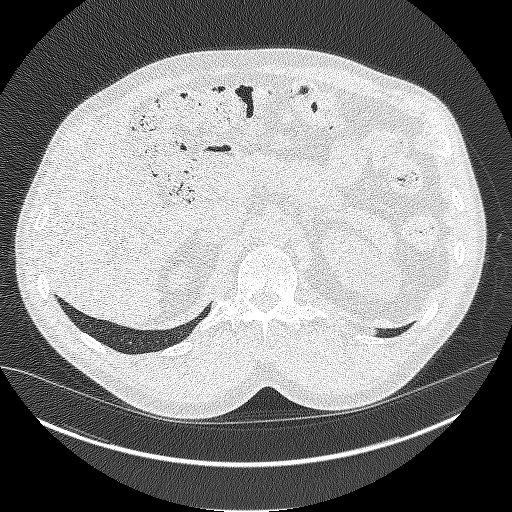
[im 72/360  lung]
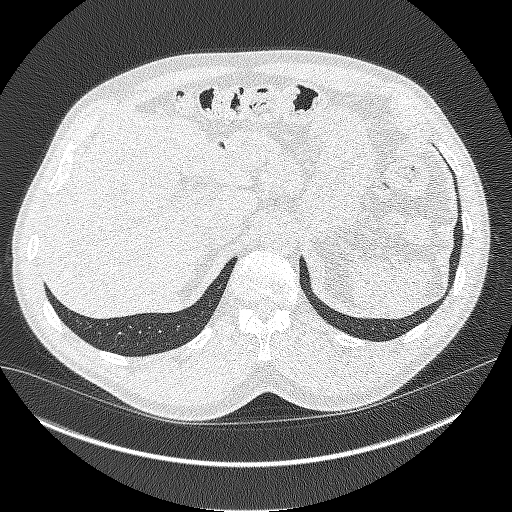
[im 108/360  lung]
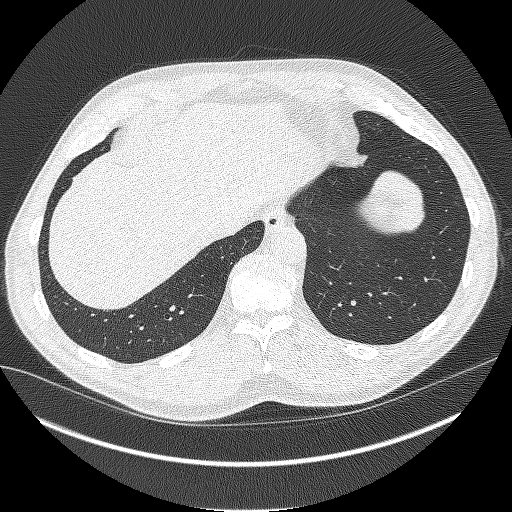
[im 144/360  mediastinal]
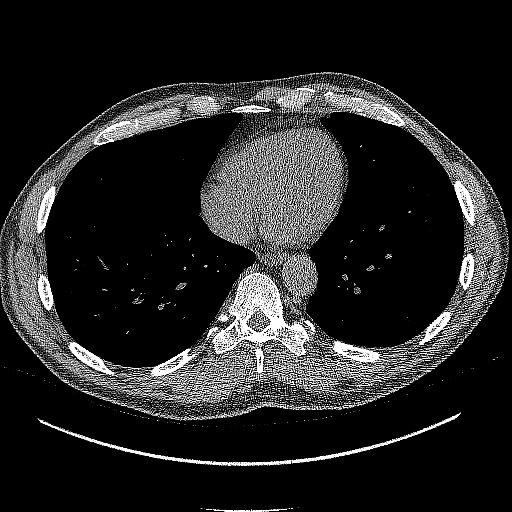
[im 144/360  lung]
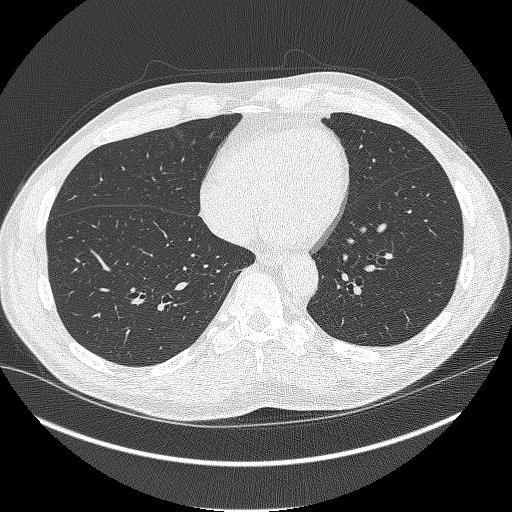
[im 162/360  lung]
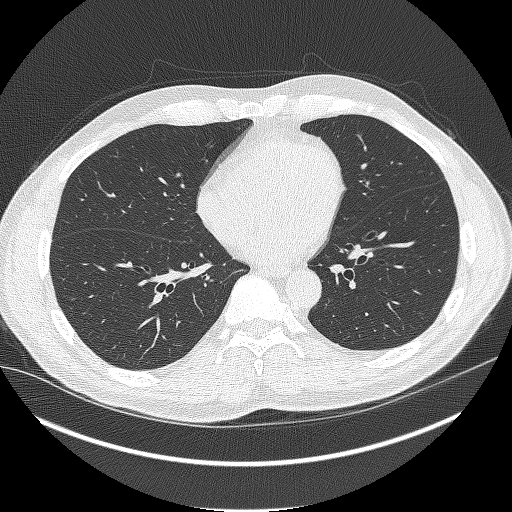
[im 198/360  lung]
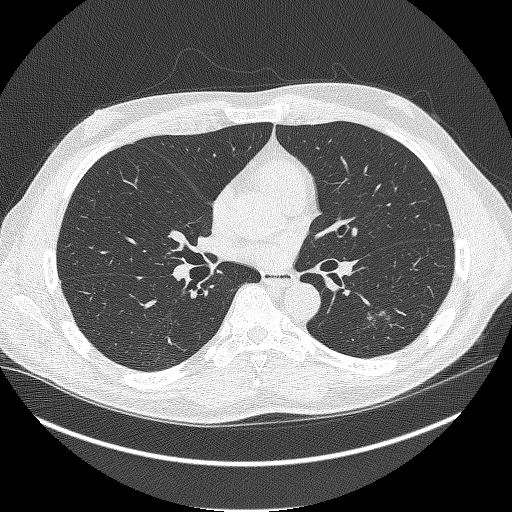
[im 216/360  lung]
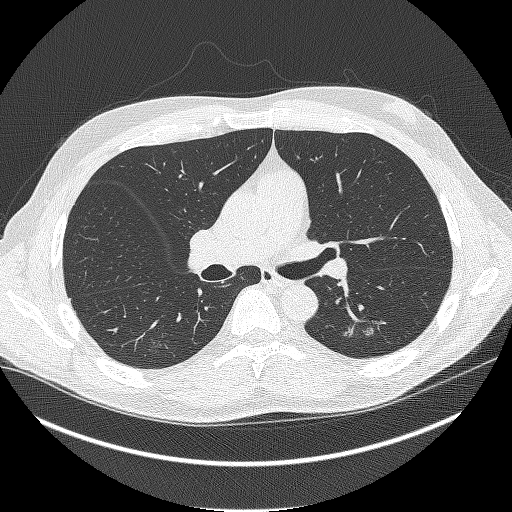
[im 252/360  mediastinal]
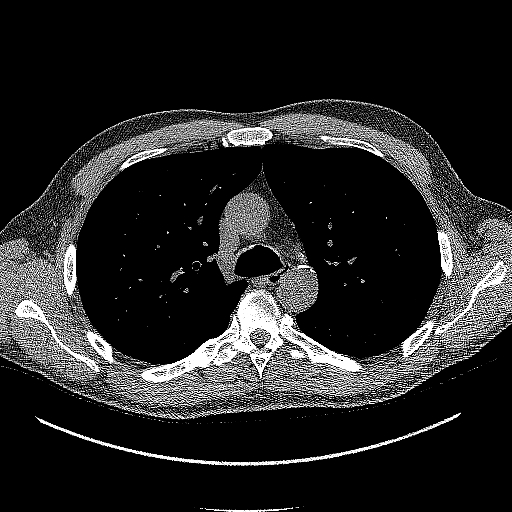
[im 252/360  lung]
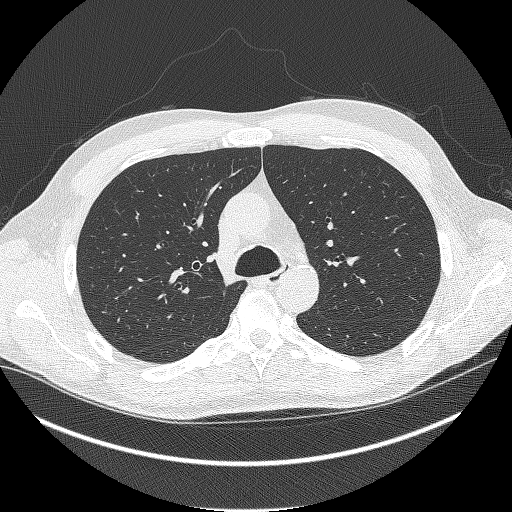
[im 288/360  lung]
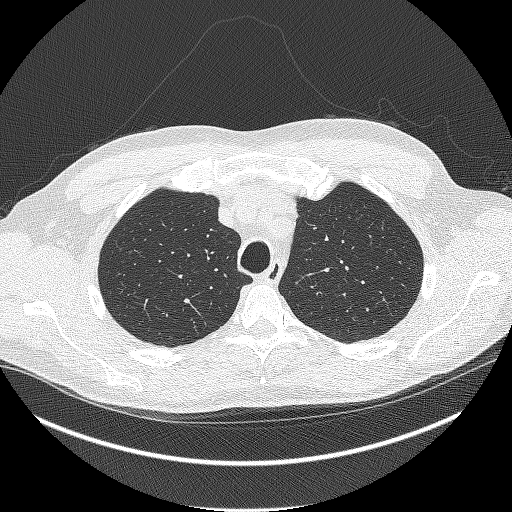
[im 306/360  lung]
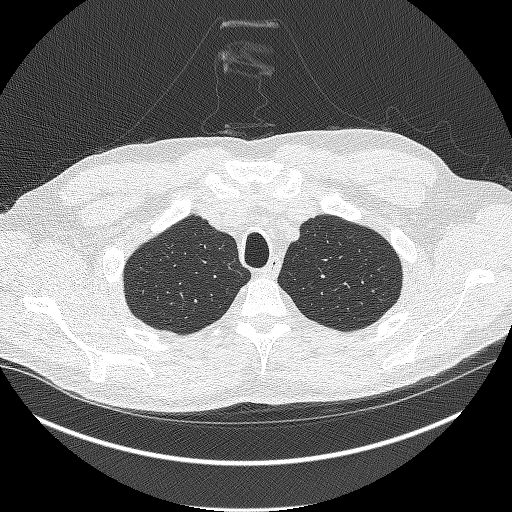
[im 342/360  lung]
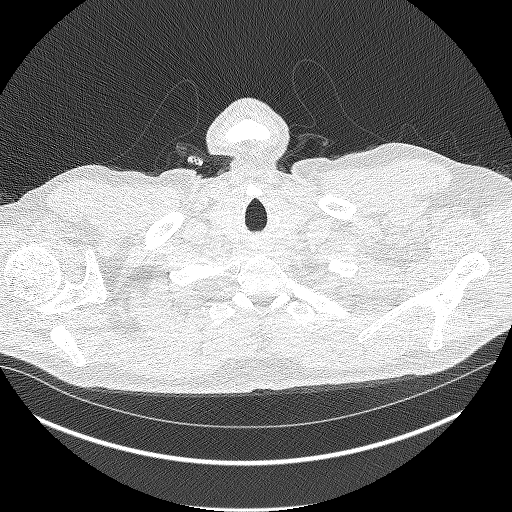

[Series 4: lung · coronal · 0.70mm/px · 3 of 286 slices shown (2 of 2)]
[im 58/286  lung]
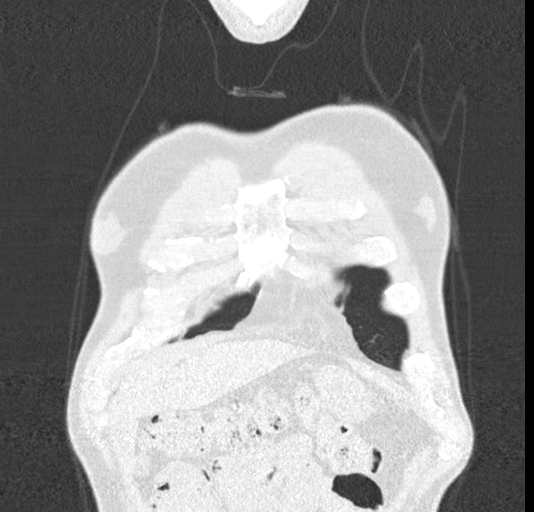
[im 115/286  lung]
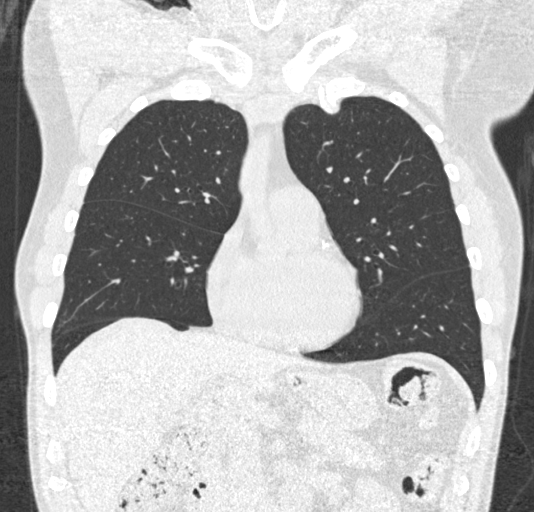
[im 172/286  lung]
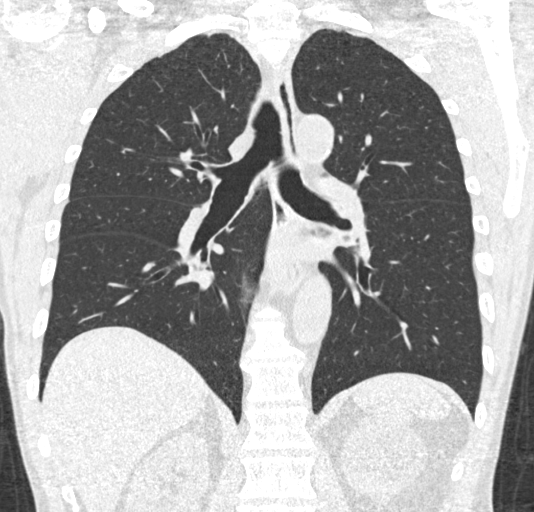

[15 of 40 positions shown; findings below may reference images not displayed]

FINDINGS: Cardiovascular: Atherosclerotic calcification of the aorta and
coronary arteries. Heart size normal. No pericardial effusion.

Mediastinum/Nodes: No pathologically enlarged mediastinal or
axillary lymph nodes. Hilar regions are difficult to evaluate
without IV contrast but appear grossly unremarkable. Esophagus is
grossly unremarkable.

Lungs/Pleura: Mild centrilobular emphysema. There are new areas of
peribronchovascular ground-glass consolidation in both lower lobes.
Additional pulmonary nodules measure 2.8 mm or less in size, as
before. No pleural fluid. Airway is unremarkable.

Upper Abdomen: 10 mm low-attenuation lesion in the dome of the liver
is too small to characterize. Visualized portions of the liver,
gallbladder and adrenal glands are otherwise unremarkable.
Low-attenuation lesions in the kidneys measure up to 5.9 cm but are
difficult to further characterize without post-contrast imaging or
due to incomplete visualization. Visualized portions of the spleen,
pancreas, stomach and bowel are grossly unremarkable.

Musculoskeletal: No worrisome lytic or sclerotic lesions.
IMPRESSION: 1. New areas of peribronchovascular ground-glass consolidation in
both lower lobes are most likely due to an infectious
bronchiolitis/bronchopneumonia. Lung-RADS 0, incomplete. Short-term
follow-up in 4-6 weeks is recommended with repeat low-dose screening
chest CT without contrast (please use the following order, "CT CHEST
LCS NODULE FOLLOW-UP W/O CM"). These results will be called to the
ordering clinician or representative by the Radiologist Assistant,
and communication documented in the PACS or zVision Dashboard.
2. Aortic atherosclerosis (6ADM0-170.0). Coronary artery
calcification.
3.  Emphysema (6ADM0-RTO.L).

## 2020-11-25 NOTE — Patient Instructions (Signed)
Thank you for allowing the Chronic Care Management team to participate in your care. It was a pleasure speaking with you today. Please feel free to contact me with questions.  Goals Addressed: Patient Care Plan: Hypertension and Hyperlipidemia    Problem Identified: Hypertension and Hyperlipidemia Monitored     Long-Range Goal: Hypertension and Hyperlipidemia Monitored   Start Date: 11/14/2020  Expected End Date: 02/12/2021  Note:   BP Readings from Last 3 Encounters:  09/25/20 122/84  09/19/20 116/81  09/06/20 119/87   Lab Results  Component Value Date   CHOL 150 05/02/2020   HDL 72 05/02/2020   LDLCALC 64 05/02/2020   TRIG 60 05/02/2020   CHOLHDL 2.1 05/02/2020     Current Barriers:  . Chronic Disease Management support and educational needs r/t Hypertension and Hyperlipidemia.  Case Manager Clinical Goal(s):  Marland Kitchen Over the next 120 days, patient will demonstrate improved adherence to prescribed treatment plan as evidenced by taking all medications as prescribed, monitoring and recording blood pressure, and adhering to a cardiac prudent/heart healthy diet.   Interventions: . Collaboration with Ernest Sizer, MD regarding development and update of comprehensive plan of care as evidenced by provider attestation and co-signature . Inter-disciplinary care team collaboration (see longitudinal plan of care) . Reviewed medications. Reports excellent compliance. Encouraged to continue taking as prescribed and notify a provider if unable to tolerate the prescribed regimen.  . Provided information regarding established blood pressure parameters along with indications for notifying a provider. Encouraged to monitor and record readings. . Discussed compliance with recommended cardiac prudent diet. Encouraged to read nutrition labels and avoid highly processed foods when possible. Encouraged to update the care management team with concerns regarding nutritional resources. . Reviewed  complications of uncontrolled blood pressure. Reviewed s/sx of heart attack, stroke and worsening symptoms that require immediate medical attention.    Patient Goals/Self-Care Activities: -Self-administer medications as prescribed -Attend all scheduled provider appointments -Monitor and record blood pressure -Adhere to recommended cardiac prudent/heart healthy diet -Notify provider or care management team with questions and new concerns as needed   Follow Up Plan:  Will follow up next month       Ernest Sweeney verbalized understanding of the information discussed during the telephonic outreach today. Declined need for mailed/printed instructions. A member of the care management team will follow up within the next month.     Ernest Sweeney [facility] Care Management [facility] [phone]

## 2020-11-29 ENCOUNTER — Ambulatory Visit: Payer: Self-pay

## 2020-11-29 DIAGNOSIS — Z789 Other specified health status: Secondary | ICD-10-CM

## 2020-11-29 DIAGNOSIS — I2584 Coronary atherosclerosis due to calcified coronary lesion: Secondary | ICD-10-CM

## 2020-11-29 DIAGNOSIS — I2510 Atherosclerotic heart disease of native coronary artery without angina pectoris: Secondary | ICD-10-CM

## 2020-11-29 DIAGNOSIS — E785 Hyperlipidemia, unspecified: Secondary | ICD-10-CM

## 2020-11-29 DIAGNOSIS — J432 Centrilobular emphysema: Secondary | ICD-10-CM

## 2020-11-29 NOTE — Chronic Care Management (AMB) (Signed)
Chronic Care Management   Follow Up Note   11/29/2020 Name: Ernest Sweeney MRN: [id number] DOB: [birth year]  Primary Care Provider: [name], MD Reason for referral : Chronic Care Management   Ernest Sweeney is a 70 y.o. year old male who is a primary care patient of [name], MD.   Review of Ernest Sweeney's status, including review of consultants reports, relevant labs and test results was conducted today. Collaboration with appropriate care team members was performed as part of the comprehensive evaluation and provision of chronic care management services.    SDOH (Social Determinants of Health) assessments performed: Yes See Care Plan activities for detailed interventions related to [place])  SDOH Interventions   Flowsheet Row Most Recent Value  SDOH Interventions   Food Insecurity Interventions [id number] Referral       Outpatient Encounter Medications as of 11/29/2020  Medication Sig  . aspirin 81 MG tablet Take 81 mg by mouth daily.  . brimonidine-timolol (COMBIGAN) 0.2-0.5 % ophthalmic solution Place 1 drop into both eyes every 12 (twelve) hours.  . Cholecalciferol (VITAMIN D) 2000 units CAPS Take 1 capsule (2,000 Units total) by mouth daily.  . fluticasone (FLONASE) 50 MCG/ACT nasal spray Place 2 sprays into both nostrils daily.  Marland Kitchen latanoprost (XALATAN) 0.005 % ophthalmic solution Place 1 drop into both eyes at bedtime.   Marland Kitchen lisinopril (ZESTRIL) 10 MG tablet Take 1-2 tablets (10-20 mg total) by mouth 2 (two) times daily. 20 mg in am and one in pm  . rosuvastatin (CRESTOR) 40 MG tablet Take 1 tablet (40 mg total) by mouth daily. In place of Atorvastatin  . sildenafil (VIAGRA) 100 MG tablet Take 1 tablet (100 mg total) by mouth daily as needed for erectile dysfunction.  . triamcinolone cream (KENALOG) 0.1 % APPLY  CREAM EXTERNALLY TWICE DAILY   No facility-administered encounter medications on file as of 11/29/2020.       Objective:  Patient Care Plan: Community  Resources    Problem Identified: Community Resources/Healthy Nutrition     Goal: Healthy Nutrition Achieved   Start Date: 11/29/2020  Expected End Date: 12/29/2020  Priority: High  Note:    Current Barriers:   Care Coordination support r/t assistance with nutritional resources.   Clinical Goal(s):   Over the next 30 days, patient will complete outreach with a Care Guide to address concerns regarding nutritional assistance.   Clinical Interventions:  . Collaboration with [name], MD regarding development and update of comprehensive plan of care as evidenced by provider attestation and co-signature . Inter-disciplinary care team collaboration (see longitudinal plan of care) . Discussed current concerns regarding financial constraints and need for nutritional assistance. Reports attempting to comply with a cardiac prudent/heart healthy diet however he expressed concerns regarding the rising cost of food. Reports preparing meals without difficulty and declined need for meal delivery. He received a gift card to assist with purchasing food in the past and found this very helpful. He is agreeable to outreach with the [place] to discuss options for nutrition assistance.  . Referral placed for assistance with nutritional resources       Patient Goals/Self-Care Activities: -Complete outreach with the [place] to discuss options for assistance with nutrition resources. -Update the care management team if additional resources are required -Notify provider or care management team with questions and new concerns as needed     Follow Up Plan:  Will follow up next month        PLAN A  member of the care management team will follow up with Ernest Sweeney within the next month.     Cristy Friedlander [facility]N Care Management [facility] [phone]

## 2020-12-02 NOTE — Patient Instructions (Signed)
Thank you for allowing the Chronic Care Management team to participate in your care.    Goals Addressed: Patient Care Plan: Community Resources    Problem Identified: Community Resources/Healthy Nutrition     Goal: Healthy Nutrition Achieved   Start Date: 11/29/2020  Expected End Date: 12/29/2020  Priority: High  Note:    Current Barriers:   Care Coordination support r/t assistance with nutritional resources.   Clinical Goal(s):   Over the next 30 days, patient will complete outreach with a Care Guide to address concerns regarding nutritional assistance.   Clinical Interventions:  . Collaboration with [name], MD regarding development and update of comprehensive plan of care as evidenced by provider attestation and co-signature . Inter-disciplinary care team collaboration (see longitudinal plan of care) . Discussed current concerns regarding financial constraints and need for nutritional assistance. Reports attempting to comply with a cardiac prudent/heart healthy diet however he expressed concerns regarding the rising cost of food. Reports preparing meals without difficulty and declined need for meal delivery. He received a gift card to assist with purchasing food in the past and found this very helpful. He is agreeable to outreach with the [place] to discuss options for nutrition assistance.  . Referral placed for assistance with nutritional resources       Patient Goals/Self-Care Activities: -Complete outreach with the [place] to discuss options for assistance with nutrition resources. -Update the care management team if additional resources are required -Notify provider or care management team with questions and new concerns as needed     Follow Up Plan:  Will follow up next month        Referral placed for assistance with nutrition resources. A member of the care management team will follow up with Mr. Heinzman within the next month.     Cristy Friedlander [facility]N Care Management [facility] [phone]

## 2020-12-03 ENCOUNTER — Telehealth: Payer: Self-pay

## 2020-12-03 NOTE — Telephone Encounter (Signed)
   Telephone encounter was:  Successful.  12/03/2020 Name: Ernest Sweeney MRN: [id number] DOB: 1951-01-18  Ernest Sweeney is a 70 y.o. year old male who is a primary care patient of [name], MD . The community resource team was consulted for assistance with [place] guide performed the following interventions: Patient provided with information about care guide support team and interviewed to confirm resource needs Placed referral to [facility] via [place] Requests/Sharepoint Spoke with patient about submitting a request for funds to the [facility] gift card for groceries in the amount of $150.00.  Got patient's monthly expense information to submit to [facility]. Told patient that I would contact him as soon as I get a response..  Follow Up Plan:  Care guide will follow up with patient by phone over the next 3 days  Cressida Milford, AAS Paralegal, [place] . Embedded Care Coordination [facility]  Care Management  [address], [place] [address] ??[email or web address]  ?? [phone]   [email or web address]

## 2020-12-04 ENCOUNTER — Telehealth: Payer: Self-pay

## 2020-12-04 NOTE — Telephone Encounter (Signed)
   Telephone encounter was:  Successful.  12/04/2020 Name: Ernest Sweeney MRN: [id number] DOB: [birth year]  Ernest Sweeney is a 70 y.o. year old male who is a primary care patient of [name], MD . The community resource team was consulted for assistance with [place] guide performed the following interventions: Follow up call placed to the patient to discuss status of referral Received email from [place] [facility] patient's grocery gift cards will be sent to the practice for pick-up. Spoke with patient to let him know to check with the Home office in the next day or two to pick up his grocery gift cards.  Follow Up Plan:  No further follow up planned at this time. The patient has been provided with needed resources.  Ciani Rutten, AAS Paralegal, [place] . Embedded Care Coordination [facility]  Care Management  [address], [place] [address] ??[email or web address]  ?? [phone]   [email or web address]

## 2020-12-06 ENCOUNTER — Telehealth: Payer: Self-pay | Admitting: Family Medicine

## 2020-12-06 NOTE — Telephone Encounter (Signed)
Pt was calling to see if his grocery gift cards where up front for him. Advised the office was closed and would reopen on Tuesday.

## 2020-12-27 NOTE — Progress Notes (Signed)
Name: Ernest Sweeney   MRN: [id number]    DOB: [birth year]   Date:12/30/2020       Progress Note  Subjective  Chief Complaint  Follow up   HPI:  Leucopenia: we will continue to monitor it yearly , last WBC was stable at 2.7    Depression: seen January 2020 and phq 9 was very high at 63, we started him on Zofot 50 mg but he  stopped Zofot because he read the side effects, he has been off medication for a while now and pqh 9 is negative, he was exercising and it had helped  with his symptoms, but he stopped walking due to the heat. He states still feeling okay    Alcohol use disorder: he always drank, used to beer when he was younger, but since his mid 20's he started to drink liquor, since his 67's he has been drinking heavier, mostly on weekends and worse since COVID-19, drinking a pint and a half of liquor on weekends and some beer. He states on Thursday he had about half a pint of liquor in the evening and noticed dizziness a couple of hours later. He stood up and had spinning sensation. Not associated with hearing loss or tinnitus. He felt some nausea and was staggering . He laid down on the floor for a couple of hours and went to bed. He has not drank anything since. He states he is feeling better now . He is feeling dizzy only when standing up int he morning He also has a mild headache today, took Tylenol He thinks he may have passed out that night. Explained the importance of quitting drinking. No other neuro deficits. We can refer him to ENT if symptoms persists    HTN: taking 20 mg lisinopril in am and 10 mg at night , he has noticed some dizziness in am's, no chest pain or palpitation    Chronic bronchitis/Emphysema : he used Breo in the past but he stopped medication on his own, quit smoking in 2016 and is gradually getting better.  No sob or wheezing. He has occasional cough that is usually dry Unchanged    Hyperlipidemia/Atherosclerosis of Aorta: taking aspirin and rosuvastatin. Last  LDL done 04/2020 down from 80 to 64  He has atherosclerosis of coronaries but not chest pain or palpitation. He  was seen by Dr. Clayborn Bigness  in the past and had a  negative stress test done 07/10/2019 , he denies myalgia    Insomnia: he states no longer taking medication, he states he has been sleeping over 6 hours per night without medication and wakes up feeling rested , however has been worried about feeling dizzy and has not slept well past couple of nights.    History of prostate cancer dx 2016 : last PSA was normal seeing Dr. Baruch Gouty every 12  months now. He denies urinary urgency and nocturia has resolved   Patient Active Problem List   Diagnosis Date Noted   Right inguinal hernia    Alcoholism ([place]) 06/28/2020   Major depression in remission ([place]) 06/28/2020   Lung nodules 09/04/2018   Chronic bronchitis ([place]) 04/23/2017   Atherosclerosis of aorta ([place]) 04/23/2017   Coronary artery disease due to calcified coronary lesion 04/23/2017   Glaucoma, left eye 08/04/2016   Prostate cancer ([place]) 07/15/2016   History of prostatectomy 07/15/2016   Vitamin D deficiency 05/31/2015   Dyslipidemia 05/31/2015   Seasonal allergic rhinitis 05/31/2015   Chronic pain of both shoulders 05/31/2015  Family history of malignant neoplasm of prostate 07/26/2012   Hematuria, microscopic 07/26/2012    Past Surgical History:  Procedure Laterality Date   COLONOSCOPY  2010   Dr Sula Rumple   COLONOSCOPY WITH PROPOFOL N/A 01/05/2020   Procedure: COLONOSCOPY WITH PROPOFOL;  Surgeon: [name], MD;  Location: [facility] ENDOSCOPY;  Service: Gastroenterology;  Laterality: N/A;   excision skin cyst  06-19-14   Dr. Festus Aloe CYST   INGUINAL HERNIA REPAIR Right 09/06/2020   Procedure: HERNIA REPAIR RIGH  INGUINAL WITH MESH ADULT, open with RNFA to assist;  Surgeon: Fredirick Maudlin, MD;  Location: [facility] ORS;  Service: General;  Laterality: Right;  Provider requesting 2 hours/120 minutes for procedure.   PELVIC  LYMPH NODE DISSECTION N/A 07/15/2016   Procedure: PELVIC LYMPH NODE DISSECTION;  Surgeon: Nickie Retort, MD;  Location: [facility] ORS;  Service: Urology;  Laterality: N/A;   ROBOT ASSISTED LAPAROSCOPIC RADICAL PROSTATECTOMY N/A 07/15/2016   Procedure: ROBOTIC ASSISTED LAPAROSCOPIC RADICAL PROSTATECTOMY;  Surgeon: Nickie Retort, MD;  Location: [facility] ORS;  Service: Urology;  Laterality: N/A;    Family History  Problem Relation Age of Onset   Heart disease Mother    Heart attack Mother    Diabetes Father    Heart attack Father    Dementia Father    Cancer Sister        Breast   Cancer Brother        Prostate   Healthy Sister    Stroke Brother     Social History   Tobacco Use   Smoking status: Former    Packs/day: 0.50    Years: 40.00    Pack years: 20.00    Types: Cigarettes    Quit date: 03/23/2016    Years since quitting: 4.7   Smokeless tobacco: Never   Tobacco comments:    to stay quit   Substance Use Topics   Alcohol use: Yes    Alcohol/week: 2.0 - 4.0 standard drinks    Types: 2 - 4 Standard drinks or equivalent per week    Comment: 1-2 times per week     Current Outpatient Medications:    aspirin 81 MG tablet, Take 81 mg by mouth daily., Disp: , Rfl:    brimonidine-timolol (COMBIGAN) 0.2-0.5 % ophthalmic solution, Place 1 drop into both eyes every 12 (twelve) hours., Disp: , Rfl:    Cholecalciferol (VITAMIN D) 2000 units CAPS, Take 1 capsule (2,000 Units total) by mouth daily., Disp: 30 capsule, Rfl: 0   fluticasone (FLONASE) 50 MCG/ACT nasal spray, Place 2 sprays into both nostrils daily., Disp: 16 g, Rfl: 6   latanoprost (XALATAN) 0.005 % ophthalmic solution, Place 1 drop into both eyes at bedtime. , Disp: , Rfl:    lisinopril (ZESTRIL) 10 MG tablet, Take 1-2 tablets (10-20 mg total) by mouth 2 (two) times daily. 20 mg in am and one in pm, Disp: 270 tablet, Rfl: 1   rosuvastatin (CRESTOR) 40 MG tablet, Take 1 tablet (40 mg total) by mouth daily. In place of  Atorvastatin, Disp: 90 tablet, Rfl: 1   sildenafil (VIAGRA) 100 MG tablet, Take 1 tablet (100 mg total) by mouth daily as needed for erectile dysfunction., Disp: 30 tablet, Rfl: 0   triamcinolone cream (KENALOG) 0.1 %, APPLY  CREAM EXTERNALLY TWICE DAILY, Disp: 45 g, Rfl: 0  No Known Allergies  I personally reviewed active problem list, medication list, allergies, family history, social history, health maintenance with the patient/caregiver today.   ROS  Constitutional: Negative  for fever or weight change.  Respiratory: Negative for cough and shortness of breath.   Cardiovascular: Negative for chest pain or palpitations.  Gastrointestinal: Negative for abdominal pain, no bowel changes.  Musculoskeletal: Negative for gait problem or joint swelling.  Skin: Negative for rash.  Neurological: positive  for dizziness and mild headache.  No other specific complaints in a complete review of systems (except as listed in HPI above).   Objective  Vitals:   12/30/20 0829  BP: 112/82  Pulse: 100  Resp: 16  Temp: 97.6 F (36.4 C)  TempSrc: Oral  SpO2: 98%  Weight: 179 lb (81.2 kg)  Height: 5\' 9"  (1.753 m)    Body mass index is 26.43 kg/m.  Physical Exam  Constitutional: Patient appears well-developed and well-nourished. No distress.  HEENT: head atraumatic, normocephalic, pupils equal and reactive to light, ears normal TM bilaterally neck supple, oral mucosa not examined  Cardiovascular: Normal rate, regular rhythm and normal heart sounds.  No murmur heard. No BLE edema. Pulmonary/Chest: Effort normal and breath sounds normal. No respiratory distress. Abdominal: Soft.  There is no tenderness. Psychiatric: Patient has a normal mood and affect. behavior is normal. Judgment and thought content normal.  Neurological: no nystagmus, romberg negative, normal grip and strength upper and lower extremities, cranial nerves intact   PHQ2/9: Depression screen [facility] 2/9 12/30/2020 11/14/2020  08/08/2020 06/28/2020 05/02/2020  Decreased Interest 0 0 0 0 1  Down, Depressed, Hopeless 0 0 0 0 0  PHQ - 2 Score 0 0 0 0 1  Altered sleeping 0 - 0 0 0  Tired, decreased energy 0 - 0 0 0  Change in appetite 1 - 0 0 0  Feeling bad or failure about yourself  0 - 0 0 0  Trouble concentrating 0 - 0 0 0  Moving slowly or fidgety/restless 0 - 0 0 0  Suicidal thoughts 0 - 0 0 0  PHQ-9 Score 1 - 0 0 1  Difficult doing work/chores - - - - Not difficult at all  Some recent data might be hidden    phq 9 is negative   Fall Risk: Fall Risk  12/30/2020 11/14/2020 08/08/2020 06/28/2020 05/02/2020  Falls in the past year? 0 0 0 0 0  Number falls in past yr: 0 0 0 0 0  Injury with Fall? 0 - 0 0 0  Risk for fall due to : - - - - -  Risk for fall due to: Comment - - - - -  Follow up - Falls prevention discussed - - -      Functional Status Survey: Is the patient deaf or have difficulty hearing?: No Does the patient have difficulty seeing, even when wearing glasses/contacts?: No Does the patient have difficulty concentrating, remembering, or making decisions?: No Does the patient have difficulty walking or climbing stairs?: No Does the patient have difficulty dressing or bathing?: No Does the patient have difficulty doing errands alone such as visiting a doctor's office or shopping?: No    Assessment & Plan  1. Vertigo  Improving, possible syncope while drinking alcohol last week, since improving we will monitor but call back for referral to ENT, we will also adjust dose of bp medication to 10 mg BID since bp slightly low today   2. Centrilobular emphysema ([place])  stable  3. Coronary artery disease due to calcified coronary lesion   4. Atherosclerosis of aorta (HCC)  - rosuvastatin (CRESTOR) 40 MG tablet; Take 1 tablet (40 mg total) by  mouth daily. In place of Atorvastatin  Dispense: 90 tablet; Refill: 1  5. Essential hypertension  Adjust for the next week to one pill twice daily  and return for BP check with CMA to see okay  - lisinopril (ZESTRIL) 10 MG tablet; Take 1-2 tablets (10-20 mg total) by mouth 2 (two) times daily. 20 mg in am and one in pm  Dispense: 270 tablet; Refill: 1  6. Dyslipidemia  On statin therapy   7. Major depression in remission [facility])  Doing well   8. Alcoholism [facility])  Discussed importance of quitting again   9. Vitamin D deficiency   10. History of prostatectomy   11. Other neutropenia (HCC)  Stable   12. Simple chronic bronchitis ([place])   13. Need for shingles vaccine  - Zoster Vaccine Adjuvanted [facility]) injection; Inject 0.5 mLs into the muscle once for 1 dose.  Dispense: 0.5 mL; Refill: 0

## 2020-12-30 ENCOUNTER — Ambulatory Visit (INDEPENDENT_AMBULATORY_CARE_PROVIDER_SITE_OTHER): Payer: Medicare HMO | Admitting: Family Medicine

## 2020-12-30 ENCOUNTER — Other Ambulatory Visit: Payer: Self-pay

## 2020-12-30 ENCOUNTER — Encounter: Payer: Self-pay | Admitting: Family Medicine

## 2020-12-30 VITALS — BP 112/82 | HR 100 | Temp 97.6°F | Resp 16 | Ht 69.0 in | Wt 179.0 lb

## 2020-12-30 DIAGNOSIS — R69 Illness, unspecified: Secondary | ICD-10-CM | POA: Diagnosis not present

## 2020-12-30 DIAGNOSIS — Z23 Encounter for immunization: Secondary | ICD-10-CM

## 2020-12-30 DIAGNOSIS — J410 Simple chronic bronchitis: Secondary | ICD-10-CM

## 2020-12-30 DIAGNOSIS — R42 Dizziness and giddiness: Secondary | ICD-10-CM | POA: Diagnosis not present

## 2020-12-30 DIAGNOSIS — E559 Vitamin D deficiency, unspecified: Secondary | ICD-10-CM | POA: Diagnosis not present

## 2020-12-30 DIAGNOSIS — I10 Essential (primary) hypertension: Secondary | ICD-10-CM | POA: Diagnosis not present

## 2020-12-30 DIAGNOSIS — I2584 Coronary atherosclerosis due to calcified coronary lesion: Secondary | ICD-10-CM

## 2020-12-30 DIAGNOSIS — F1020 Alcohol dependence, uncomplicated: Secondary | ICD-10-CM

## 2020-12-30 DIAGNOSIS — Z9079 Acquired absence of other genital organ(s): Secondary | ICD-10-CM | POA: Diagnosis not present

## 2020-12-30 DIAGNOSIS — I700 Atherosclerosis of aorta: Secondary | ICD-10-CM

## 2020-12-30 DIAGNOSIS — E785 Hyperlipidemia, unspecified: Secondary | ICD-10-CM | POA: Diagnosis not present

## 2020-12-30 DIAGNOSIS — D708 Other neutropenia: Secondary | ICD-10-CM

## 2020-12-30 DIAGNOSIS — F325 Major depressive disorder, single episode, in full remission: Secondary | ICD-10-CM

## 2020-12-30 DIAGNOSIS — I2510 Atherosclerotic heart disease of native coronary artery without angina pectoris: Secondary | ICD-10-CM | POA: Diagnosis not present

## 2020-12-30 DIAGNOSIS — J432 Centrilobular emphysema: Secondary | ICD-10-CM

## 2020-12-30 MED ORDER — ROSUVASTATIN CALCIUM 40 MG PO TABS: 40.0000 mg | ORAL_TABLET | Freq: Every day | ORAL | 1 refills | Status: DC

## 2020-12-30 MED ORDER — SHINGRIX 50 MCG/0.5ML IM SUSR: 0.5000 mL | Freq: Once | INTRAMUSCULAR | 0 refills | Status: AC

## 2020-12-30 MED ORDER — LISINOPRIL 10 MG PO TABS: 10.0000 mg | ORAL_TABLET | Freq: Two times a day (BID) | ORAL | 1 refills | Status: DC

## 2021-01-02 ENCOUNTER — Ambulatory Visit: Payer: Self-pay

## 2021-01-02 ENCOUNTER — Telehealth: Payer: Self-pay

## 2021-01-02 ENCOUNTER — Telehealth: Payer: Medicare HMO

## 2021-01-02 DIAGNOSIS — I10 Essential (primary) hypertension: Secondary | ICD-10-CM

## 2021-01-02 NOTE — Telephone Encounter (Signed)
  Care Management   Follow Up Note   01/02/2021 Name: Ernest Sweeney MRN: [id number] DOB: 12-09-1950   Primary Care Provider: [name], MD Reason for referral : Chronic Care Management   An unsuccessful outreach was attempted today. [name] is currently enrolled in the Chronic Care Management program.    Follow Up Plan:  A HIPPA compliant voice message was left today requesting a return call.    Cristy Friedlander Health/[facility]N Care Management [facility] [phone]

## 2021-01-02 NOTE — Chronic Care Management (AMB) (Signed)
  Chronic Care Management   Note  01/02/2021 Name: Ernest Sweeney MRN: [id number] DOB: 12-15-1950    Call returned from Mr. Brigitte Pulse. Anticipates being available for care management outreach on 01/09/21. Agreed to call with urgent concerns if needed prior to the scheduled outreach.    Follow up plan: Will follow up on 01/09/21.     Cristy Friedlander Health/THN Care Management [facility] [phone]

## 2021-01-06 ENCOUNTER — Other Ambulatory Visit: Payer: Self-pay

## 2021-01-06 ENCOUNTER — Ambulatory Visit: Payer: Medicare HMO

## 2021-01-06 VITALS — BP 122/84

## 2021-01-06 DIAGNOSIS — I10 Essential (primary) hypertension: Secondary | ICD-10-CM

## 2021-01-06 NOTE — Progress Notes (Signed)
Since decreasing lisinopril patient denies any side effects and blood pressure today was 122/84.  Pt told to continue current treatment.

## 2021-01-09 ENCOUNTER — Ambulatory Visit (INDEPENDENT_AMBULATORY_CARE_PROVIDER_SITE_OTHER): Payer: Medicare HMO

## 2021-01-09 DIAGNOSIS — I10 Essential (primary) hypertension: Secondary | ICD-10-CM

## 2021-01-09 NOTE — Patient Instructions (Addendum)
Thank you for allowing the Chronic Care Management team to participate in your care. It was a pleasure speaking with you. Please feel free to contact me with questions.   Goals Addressed:  Patient Care Plan: Hypertension and Hyperlipidemia     Problem Identified: Hypertension and Hyperlipidemia Monitored      Long-Range Goal: Hypertension and Hyperlipidemia Monitored   Start Date: 11/14/2020  Expected End Date: 02/12/2021  Note:   BP Readings from Last 3 Encounters:  09/25/20 122/84  09/19/20 116/81  09/06/20 119/87   Lab Results  Component Value Date   CHOL 150 05/02/2020   HDL 72 05/02/2020   LDLCALC 64 05/02/2020   TRIG 60 05/02/2020   CHOLHDL 2.1 05/02/2020     Current Barriers:  Chronic Disease Management support and educational needs r/t Hypertension and Hyperlipidemia.  Case Manager Clinical Goal(s):  Over the next 120 days, patient will demonstrate improved adherence to prescribed treatment plan as evidenced by taking all medications as prescribed, monitoring and recording blood pressure, and adhering to a cardiac prudent/heart healthy diet.   Interventions: Collaboration with [name], MD regarding development and update of comprehensive plan of care as evidenced by provider attestation and co-signature Inter-disciplinary care team collaboration (see longitudinal plan of care) Reviewed medications. Reports excellent compliance with medications and treatment recommendations. Reviewed established blood pressure parameters along with indications for notifying a provider. Encouraged to monitor and record readings. Reports feeling very well and trying to improve his overall health. Attempting to walk daily to improve heart health. Reports slightly decreased appetite. Attempting to adhere to a heart healthy/cardiac prudent diet. Encouraged to keep the care management team updated of nutritional needs.  Reviewed s/sx of heart attack, stroke and worsening symptoms that  require immediate medical attention.    Patient Goals/Self-Care Activities: -Self-administer medications as prescribed -Attend all scheduled provider appointments -Monitor and record blood pressure -Adhere to recommended cardiac prudent/heart healthy diet -Notify provider or care management team with questions and new concerns as needed   Goal Met      Ernest Sweeney verbalized understanding of the information discussed during the telephonic outreach today. Declined need for mailed/instructed resources. Will follow up next month to reassess need for additional nutritional resources.    Ernest Sweeney Health/THN Care Management [facility] [phone]

## 2021-01-09 NOTE — Chronic Care Management (AMB) (Signed)
Chronic Care Management   Follow Up Note   01/09/2021 Name: ASHYR HEDGEPATH MRN: [id number] DOB: [birth year]  Primary Care Provider: [name], MD Reason for referral : Chronic Care Management   JUSTINE COSSIN is a 70 y.o. year old male who is a primary care patient of [name], MD. Mr. Tripodi is currently enrolled in the Chronic Care Management program. A routine telephonic outreach was conducted today.  Review of Ms. Tweedy's status, including review of consultants reports, relevant labs and test results was conducted today. Collaboration with appropriate care team members was performed as part of the comprehensive evaluation and provision of chronic care management services.       Outpatient Encounter Medications as of 01/09/2021  Medication Sig Note   aspirin 81 MG tablet Take 81 mg by mouth daily.    brimonidine-timolol (COMBIGAN) 0.2-0.5 % ophthalmic solution Place 1 drop into both eyes every 12 (twelve) hours.    Cholecalciferol (VITAMIN D) 2000 units CAPS Take 1 capsule (2,000 Units total) by mouth daily.    fluticasone (FLONASE) 50 MCG/ACT nasal spray Place 2 sprays into both nostrils daily.    latanoprost (XALATAN) 0.005 % ophthalmic solution Place 1 drop into both eyes at bedtime.     lisinopril (ZESTRIL) 10 MG tablet Take 1-2 tablets (10-20 mg total) by mouth 2 (two) times daily. 20 mg in am and one in pm 01/09/2021: Reports dose was adjusted to one pill in the morning and one in the evening.   rosuvastatin (CRESTOR) 40 MG tablet Take 1 tablet (40 mg total) by mouth daily. In place of Atorvastatin    sildenafil (VIAGRA) 100 MG tablet Take 1 tablet (100 mg total) by mouth daily as needed for erectile dysfunction.    triamcinolone cream (KENALOG) 0.1 % APPLY  CREAM EXTERNALLY TWICE DAILY    No facility-administered encounter medications on file as of 01/09/2021.     Objective:  Patient Care Plan: Hypertension and Hyperlipidemia     Problem Identified: Hypertension  and Hyperlipidemia Monitored      Long-Range Goal: Hypertension and Hyperlipidemia Monitored   Start Date: 11/14/2020  Expected End Date: 02/12/2021  Note:   BP Readings from Last 3 Encounters:  09/25/20 122/84  09/19/20 116/81  09/06/20 119/87   Lab Results  Component Value Date   CHOL 150 05/02/2020   HDL 72 05/02/2020   LDLCALC 64 05/02/2020   TRIG 60 05/02/2020   CHOLHDL 2.1 05/02/2020     Current Barriers:  Chronic Disease Management support and educational needs r/t Hypertension and Hyperlipidemia.  Case Manager Clinical Goal(s):  Over the next 120 days, patient will demonstrate improved adherence to prescribed treatment plan as evidenced by taking all medications as prescribed, monitoring and recording blood pressure, and adhering to a cardiac prudent/heart healthy diet.   Interventions: Collaboration with [name], MD regarding development and update of comprehensive plan of care as evidenced by provider attestation and co-signature Inter-disciplinary care team collaboration (see longitudinal plan of care) Reviewed medications. Reports excellent compliance with medications and treatment recommendations. Reviewed established blood pressure parameters along with indications for notifying a provider. Encouraged to monitor and record readings. Reports feeling very well and trying to improve his overall health. Attempting to walk daily to improve heart health. Reports slightly decreased appetite. Attempting to adhere to a heart healthy/cardiac prudent diet. Encouraged to keep the care management team updated of nutritional needs.  Reviewed s/sx of heart attack, stroke and worsening symptoms that require immediate medical attention.  Patient Goals/Self-Care Activities: -Self-administer medications as prescribed -Attend all scheduled provider appointments -Monitor and record blood pressure -Adhere to recommended cardiac prudent/heart healthy diet -Notify provider or  care management team with questions and new concerns as needed   Goal Met     PLAN Will follow up next month to reassess need for nutritional resources.   [facility] Care Management [facility] [phone]

## 2021-01-30 ENCOUNTER — Ambulatory Visit (INDEPENDENT_AMBULATORY_CARE_PROVIDER_SITE_OTHER): Payer: Medicare HMO

## 2021-01-30 DIAGNOSIS — I10 Essential (primary) hypertension: Secondary | ICD-10-CM

## 2021-01-31 NOTE — Chronic Care Management (AMB) (Signed)
Chronic Care Management   CCM RN Visit Note   Name: Ernest Sweeney MRN: [id number] DOB: 1951/02/19  Subjective: Ernest Sweeney is a 70 y.o. year old male who is a primary care patient of Ernest Sizer, MD. The care management team was consulted for assistance with disease management and care coordination needs.    Engaged with patient by telephone for follow up visit in response to provider referral for case management and/or care coordination services.   Consent to Services:  The patient was given information about Chronic Care Management services, agreed to services, and gave verbal consent prior to initiation of services.  Please see initial visit note for detailed documentation.    Assessment: Review of patient past medical history, allergies, medications, health status, including review of consultants reports, laboratory and other test data, was performed as part of comprehensive evaluation and provision of chronic care management services.   SDOH (Social Determinants of Health) assessments and interventions performed:  Yes  CCM Care Plan  No Known Allergies  Outpatient Encounter Medications as of 01/30/2021  Medication Sig Note   aspirin 81 MG tablet Take 81 mg by mouth daily.    brimonidine-timolol (COMBIGAN) 0.2-0.5 % ophthalmic solution Place 1 drop into both eyes every 12 (twelve) hours.    Cholecalciferol (VITAMIN D) 2000 units CAPS Take 1 capsule (2,000 Units total) by mouth daily.    fluticasone (FLONASE) 50 MCG/ACT nasal spray Place 2 sprays into both nostrils daily.    latanoprost (XALATAN) 0.005 % ophthalmic solution Place 1 drop into both eyes at bedtime.     lisinopril (ZESTRIL) 10 MG tablet Take 1-2 tablets (10-20 mg total) by mouth 2 (two) times daily. 20 mg in am and one in pm 01/09/2021: Reports dose was adjusted to one pill in the morning and one in the evening.   rosuvastatin (CRESTOR) 40 MG tablet Take 1 tablet (40 mg total) by mouth daily. In place of  Atorvastatin    sildenafil (VIAGRA) 100 MG tablet Take 1 tablet (100 mg total) by mouth daily as needed for erectile dysfunction.    triamcinolone cream (KENALOG) 0.1 % APPLY  CREAM EXTERNALLY TWICE DAILY    No facility-administered encounter medications on file as of 01/30/2021.    Patient Active Problem List   Diagnosis Date Noted   Right inguinal hernia    Alcoholism (Ernest Sweeney) 06/28/2020   Major depression in remission (Ernest Sweeney) 06/28/2020   Lung nodules 09/04/2018   Chronic bronchitis (Ernest Sweeney) 04/23/2017   Atherosclerosis of aorta (Ernest Sweeney) 04/23/2017   Coronary artery disease due to calcified coronary lesion 04/23/2017   Glaucoma, left eye 08/04/2016   Prostate cancer (Ernest Sweeney) 07/15/2016   History of prostatectomy 07/15/2016   Vitamin D deficiency 05/31/2015   Dyslipidemia 05/31/2015   Seasonal allergic rhinitis 05/31/2015   Chronic pain of both shoulders 05/31/2015   Family history of malignant neoplasm of prostate 07/26/2012   Hematuria, microscopic 07/26/2012    Conditions to be addressed/monitored:HT[employer] and [employer].  Patient Care Plan: Hypertension and Hyperlipidemia     Problem Identified: Hypertension and Hyperlipidemia Monitored      Long-Range Goal: Hypertension and Hyperlipidemia Monitored   Start Date: 11/14/2020  Expected End Date: 02/12/2021  BP Readings from Last 3 Encounters:  09/25/20 122/84  09/19/20 116/81  09/06/20 119/87   Lab Results  Component Value Date   CHOL 150 05/02/2020   HDL 72 05/02/2020   LDLCALC 64 05/02/2020   TRIG 60 05/02/2020   CHOLHDL 2.1 05/02/2020  Current Barriers:  Chronic Disease Management support and educational needs r/t Hypertension and Hyperlipidemia.  Case Manager Clinical Goal(s):  Over the next 120 days, patient will demonstrate improved adherence to prescribed treatment plan as evidenced by taking all medications as prescribed, monitoring and recording blood pressure, and adhering to a cardiac prudent/heart healthy  diet.   Interventions: Collaboration with Ernest Sizer, MD regarding development and update of comprehensive plan of care as evidenced by provider attestation and co-signature Inter-disciplinary care team collaboration (see longitudinal plan of care) Reviewed medications. Reports excellent compliance with medications and treatment recommendations. Reviewed established blood pressure parameters along with indications for notifying a provider. Encouraged to monitor and record readings. Reports feeling very well and trying to improve his overall health. Attempting to walk daily to improve heart health. Reports slightly decreased appetite. Attempting to adhere to a heart healthy/cardiac prudent diet. Encouraged to keep the care management team updated of nutritional needs.  Reviewed s/sx of heart attack, stroke and worsening symptoms that require immediate medical attention.    Patient Goals/Self-Care Activities: -Self-administer medications as prescribed -Attend all scheduled provider appointments -Monitor and record blood pressure -Adhere to recommended cardiac prudent/heart healthy diet -Notify provider or care management team with questions and new concerns as needed   Goal Met    Patient Care Plan: Community Resources      Goal: Healthy Nutrition Achieved Completed 01/30/2021  Start Date: 11/29/2020  Expected End Date: 12/29/2020  Priority: High  Note:    Current Barriers:  Care Coordination support r/t assistance with nutritional resources.   Clinical Goal(s):  Over the next 30 days, patient will complete outreach with a Care Guide to address concerns regarding nutritional assistance.   Clinical Interventions:  Collaboration with Ernest Sizer, MD regarding development and update of comprehensive plan of care as evidenced by provider attestation and co-signature Inter-disciplinary care team collaboration (see longitudinal plan of care) Discussed current concerns  regarding financial constraints and need for nutritional assistance. Confirmed receiving a gift card for nutritional resources following the last outreach. He declined need for delivered meals but would like to receive additional assistance if available. He is aware that an additional gift card may not be immediately available. Agreeable to additional outreach with [employer] to discuss additional nutritional resources in [place]. Referral placed for assistance with nutritional resources       Patient Goals/Self-Care Activities: Complete outreach with the [place] to discuss options for assistance with nutrition resources. Update the care management team if additional resources are required      Follow Up Plan:  Mr. Ramnauth will follow up with the [facility] Guide      PLAN Mr. Strozier will follow up with the [place] regarding nutrition resources. Agreed to contact the care management team if additional assistance is required.    [facility] Care Management [facility] [phone]

## 2021-02-03 DIAGNOSIS — H401132 Primary open-angle glaucoma, bilateral, moderate stage: Secondary | ICD-10-CM | POA: Diagnosis not present

## 2021-02-10 DIAGNOSIS — H401132 Primary open-angle glaucoma, bilateral, moderate stage: Secondary | ICD-10-CM | POA: Diagnosis not present

## 2021-02-18 NOTE — Patient Instructions (Signed)
Thank you for allowing the Chronic Care Management team to participate in your care.   Goals Addressed Patient Care Plan: Hypertension and Hyperlipidemia     Problem Identified: Hypertension and Hyperlipidemia Monitored      Long-Range Goal: Hypertension and Hyperlipidemia Monitored   Start Date: 11/14/2020  Expected End Date: 02/12/2021  BP Readings from Last 3 Encounters:  09/25/20 122/84  09/19/20 116/81  09/06/20 119/87   Lab Results  Component Value Date   CHOL 150 05/02/2020   HDL 72 05/02/2020   LDLCALC 64 05/02/2020   TRIG 60 05/02/2020   CHOLHDL 2.1 05/02/2020     Current Barriers:  Chronic Disease Management support and educational needs r/t Hypertension and Hyperlipidemia.  Case Manager Clinical Goal(s):  Over the next 120 days, patient will demonstrate improved adherence to prescribed treatment plan as evidenced by taking all medications as prescribed, monitoring and recording blood pressure, and adhering to a cardiac prudent/heart healthy diet.   Interventions: Collaboration with [name], MD regarding development and update of comprehensive plan of care as evidenced by provider attestation and co-signature Inter-disciplinary care team collaboration (see longitudinal plan of care) Reviewed medications. Reports excellent compliance with medications and treatment recommendations. Reviewed established blood pressure parameters along with indications for notifying a provider. Encouraged to monitor and record readings. Reports feeling very well and trying to improve his overall health. Attempting to walk daily to improve heart health. Reports slightly decreased appetite. Attempting to adhere to a heart healthy/cardiac prudent diet. Encouraged to keep the care management team updated of nutritional needs.  Reviewed s/sx of heart attack, stroke and worsening symptoms that require immediate medical attention.    Patient Goals/Self-Care Activities: -Self-administer  medications as prescribed -Attend all scheduled provider appointments -Monitor and record blood pressure -Adhere to recommended cardiac prudent/heart healthy diet -Notify provider or care management team with questions and new concerns as needed   Goal Met    Patient Care Plan: Community Resources      Goal: Healthy Nutrition Achieved Completed 01/30/2021  Start Date: 11/29/2020  Expected End Date: 12/29/2020  Priority: High  Note:    Current Barriers:  Care Coordination support r/t assistance with nutritional resources.   Clinical Goal(s):  Over the next 30 days, patient will complete outreach with a Care Guide to address concerns regarding nutritional assistance.   Clinical Interventions:  Collaboration with [name], MD regarding development and update of comprehensive plan of care as evidenced by provider attestation and co-signature Inter-disciplinary care team collaboration (see longitudinal plan of care) Discussed current concerns regarding financial constraints and need for nutritional assistance. Confirmed receiving a gift card for nutritional resources following the last outreach. He declined need for delivered meals but would like to receive additional assistance if available. He is aware that an additional gift card may not be immediately available. Agreeable to additional outreach with [employer] to discuss additional nutritional resources in [place]. Referral placed for assistance with nutritional resources       Patient Goals/Self-Care Activities: Complete outreach with the [place] to discuss options for assistance with nutrition resources. Update the care management team if additional resources are required      Follow Up Plan:  Mr. Geisel will follow up with the [place]     Mr. Hardenbrook verbalized understanding of the information discussed during the telephonic outreach today. Declined need for  mailed/printed instructions. He will follow up with the [place] regarding nutrition resources. Agreed to  contact the care management team if additional assistance is required.    [facility] Care Management [facility] [phone]

## 2021-02-26 ENCOUNTER — Telehealth: Payer: Self-pay | Admitting: *Deleted

## 2021-02-27 ENCOUNTER — Ambulatory Visit (INDEPENDENT_AMBULATORY_CARE_PROVIDER_SITE_OTHER): Payer: Medicare HMO

## 2021-02-27 ENCOUNTER — Other Ambulatory Visit: Payer: Self-pay

## 2021-02-27 VITALS — BP 124/84 | HR 90 | Temp 97.6°F | Resp 16 | Ht 69.0 in | Wt 177.2 lb

## 2021-02-27 DIAGNOSIS — Z0000 Encounter for general adult medical examination without abnormal findings: Secondary | ICD-10-CM

## 2021-02-27 NOTE — Telephone Encounter (Signed)
   Telephone encounter was:  Unsuccessful.  02/27/2021 Name: Ernest Sweeney MRN: [id number] DOB: 08/10/1950  Unsuccessful outbound call made today to assist with:  Food Insecurity  Outreach Attempt:  1st Attempt  A HIPAA compliant voice message was left requesting a return call.  Instructed patient to call back at   Instructed patient to call back at [phone]  at their earliest convenience. .  [name], Care Management  [phone] [address] , [place] [address] Email : [email or web address]

## 2021-02-27 NOTE — Progress Notes (Signed)
Subjective:   Ernest Sweeney is a 70 y.o. male who presents for Medicare Annual/Subsequent preventive examination.  Review of Systems     Cardiac Risk Factors include: advanced age (>34mn, >>63women);dyslipidemia;male gender;hypertension     Objective:    Today's Vitals   02/27/21 1044  BP: 124/84  Pulse: 90  Resp: 16  Temp: 97.6 F (36.4 C)  TempSrc: Oral  SpO2: 98%  Weight: 177 lb 3.2 oz (80.4 kg)  Height: '5\' 9"'$  (1.753 m)   Body mass index is 26.17 kg/m.  Advanced Directives 02/27/2021 09/06/2020 08/30/2020 02/27/2020 01/05/2020 02/21/2019 09/28/2018  Does Patient Have a Medical Advance Directive? No No No No No No No  Would patient like information on creating a medical advance directive? No - Patient declined No - Patient declined Yes (MAU/Ambulatory/Procedural Areas - Information given) No - Patient declined No - Patient declined Yes (MAU/Ambulatory/Procedural Areas - Information given) No - Patient declined    Current Medications (verified) Outpatient Encounter Medications as of 02/27/2021  Medication Sig   aspirin 81 MG tablet Take 81 mg by mouth daily.   brimonidine-timolol (COMBIGAN) 0.2-0.5 % ophthalmic solution Place 1 drop into both eyes every 12 (twelve) hours.   Cholecalciferol (VITAMIN D) 2000 units CAPS Take 1 capsule (2,000 Units total) by mouth daily.   fluticasone (FLONASE) 50 MCG/ACT nasal spray Place 2 sprays into both nostrils daily.   latanoprost (XALATAN) 0.005 % ophthalmic solution Place 1 drop into both eyes at bedtime.    lisinopril (ZESTRIL) 10 MG tablet Take 1-2 tablets (10-20 mg total) by mouth 2 (two) times daily. 20 mg in am and one in pm   rosuvastatin (CRESTOR) 40 MG tablet Take 1 tablet (40 mg total) by mouth daily. In place of Atorvastatin   sildenafil (VIAGRA) 100 MG tablet Take 1 tablet (100 mg total) by mouth daily as needed for erectile dysfunction.   triamcinolone cream (KENALOG) 0.1 % APPLY  CREAM EXTERNALLY TWICE DAILY   No  facility-administered encounter medications on file as of 02/27/2021.    Allergies (verified) Patient has no known allergies.   History: Past Medical History:  Diagnosis Date   Anxiety    Panic attacks in the past   Arthritis    Benign prostatic hypertrophy without lower urinary tract symptoms    Cancer (HCC)    Prostate. monitoring now since surgery   Colon polyp    GERD (gastroesophageal reflux disease)    Glaucoma (increased eye pressure)    Left eye only   Hyperlipidemia    Hypertension    Past Surgical History:  Procedure Laterality Date   COLONOSCOPY  2010   Dr ISula Rumple  COLONOSCOPY WITH PROPOFOL N/A 01/05/2020   Procedure: COLONOSCOPY WITH PROPOFOL;  Surgeon: AJonathon Bellows MD;  [facility]  Service: Gastroenterology;  Laterality: N/A;   excision skin cyst  06-19-14   Dr. SFestus AloeCYST   INGUINAL HERNIA REPAIR Right 09/06/2020   Procedure: HERNIA REPAIR RIGH  INGUINAL WITH MESH ADULT, open with RNFA to assist;  Surgeon: CFredirick Maudlin MD;  Location: [facility];  Service: General;  Laterality: Right;  Provider requesting 2 hours/120 minutes for procedure.   PELVIC LYMPH NODE DISSECTION N/A 07/15/2016   Procedure: PELVIC LYMPH NODE DISSECTION;  Surgeon: BNickie Retort MD;  Location: [facility] ORS;  Service: Urology;  Laterality: N/A;   ROBOT ASSISTED LAPAROSCOPIC RADICAL PROSTATECTOMY N/A 07/15/2016   Procedure: ROBOTIC ASSISTED LAPAROSCOPIC RADICAL PROSTATECTOMY;  Surgeon: BNickie Retort MD;  Location: [facility];  Service: Urology;  Laterality: N/A;   Family History  Problem Relation Age of Onset   Heart disease Mother    Heart attack Mother    Diabetes Father    Heart attack Father    Dementia Father    Cancer Sister        Breast   Cancer Brother        Prostate   Healthy Sister    Stroke Brother    Social History   Socioeconomic History   Marital status: Widowed    Spouse name: Not on file   Number of children: 0   Years of  education: Not on file   Highest education level: 12th grade  Occupational History   Occupation: custodian     Comment: part time   Tobacco Use   Smoking status: Former    Packs/day: 0.50    Years: 40.00    Pack years: 20.00    Types: Cigarettes    Quit date: 03/23/2016    Years since quitting: 4.9   Smokeless tobacco: Never  Vaping Use   Vaping Use: Never used  Substance and Sexual Activity   Alcohol use: Yes    Alcohol/week: 2.0 - 4.0 standard drinks    Types: 2 - 4 Standard drinks or equivalent per week    Comment: 1-2 times per week   Drug use: No   Sexual activity: Yes    Birth control/protection: Condom  Other Topics Concern   Not on file  Social History Narrative   Patient lives alone.    He will be staying with his sister after surgery.   Social Determinants of Health   Financial Resource Strain: Medium Risk   Difficulty of Paying Living Expenses: Somewhat hard  Food Insecurity: Food Insecurity Present   Worried About Charity fundraiser in the Last Year: Often true   Arboriculturist in the Last Year: Never true  Transportation Needs: No Transportation Needs   Lack of Transportation (Medical): No   Lack of Transportation (Non-Medical): No  Physical Activity: Sufficiently Active   Days of Exercise per Week: 5 days   Minutes of Exercise per Session: 30 min  Stress: No Stress Concern Present   Feeling of Stress : Not at all  Social Connections: Socially Isolated   Frequency of Communication with Friends and Family: More than three times a week   Frequency of Social Gatherings with Friends and Family: Once a week   Attends Religious Services: Never   Marine scientist or Organizations: No   Attends Archivist Meetings: Never   Marital Status: Widowed    Tobacco Counseling Counseling given: Not Answered   Clinical Intake:  Pre-visit preparation completed: Yes  Pain : No/denies pain     BMI - recorded: 26.17 Nutritional Status: BMI 25  -29 Overweight Nutritional Risks: None Diabetes: No  How often do you need to have someone help you when you read instructions, pamphlets, or other written materials from your doctor or pharmacy?: 1 - Never   Interpreter Needed?: No  Information entered by :: Clemetine Marker LPN   Activities of Daily Living In your present state of health, do you have any difficulty performing the following activities: 02/27/2021 12/30/2020  Hearing? N N  Vision? Y N  Difficulty concentrating or making decisions? N N  Walking or climbing stairs? N N  Dressing or bathing? N N  Doing errands, shopping? N N  Preparing Food and eating ? N -  Using the Toilet? N -  In the past six months, have you accidently leaked urine? Y -  Do you have problems with loss of bowel control? N -  Managing your Medications? N -  Managing your Finances? N -  Housekeeping or managing your Housekeeping? N -  Some recent data might be hidden    Patient Care Team: [name], MD as PCP - General (Family Medicine) Abbie Sons, MD as Consulting Physician (Urology) Billey Co, MD as Consulting Physician (Urology) Noreene Filbert, MD as Referring Physician (Radiation Oncology) Yolonda Kida, MD as Consulting Physician (Cardiology)  Indicate any recent Medical Services you may have received from other than [facility] providers in the past year (date may be approximate).     Assessment:   This is a routine wellness examination for [place].  Hearing/Vision screen Hearing Screening - Comments:: Pt denies hearing difficulty Vision Screening - Comments:: Annual vision screenings done at [facility]. Michelene Heady  Dietary issues and exercise activities discussed: Current Exercise Habits: Home exercise routine, Type of exercise: walking, Time (Minutes): 30, Frequency (Times/Week): 5, Weekly Exercise (Minutes/Week): 150, Intensity: Mild, Exercise limited by: None identified   Goals Addressed              This Visit's Progress    DIET - INCREASE WATER INTAKE   On track    Recommend to drink at least 6-8 8oz glasses of water per day.       Depression Screen PHQ 2/9 Scores 02/27/2021 12/30/2020 11/14/2020 08/08/2020 06/28/2020 05/02/2020 04/26/2020  PHQ - 2 Score 0 0 0 0 0 1 1  PHQ- 9 Score - 1 - 0 0 1 -    Fall Risk Fall Risk  02/27/2021 12/30/2020 11/14/2020 08/08/2020 06/28/2020  Falls in the past year? 0 0 0 0 0  Number falls in past yr: 0 0 0 0 0  Injury with Fall? 0 0 - 0 0  Risk for fall due to : No Fall Risks - - - -  Risk for fall due to: Comment - - - - -  Follow up Falls prevention discussed - Falls prevention discussed - -    FALL RISK PREVENTION PERTAINING TO THE HOME:  Any stairs in or around the home? Yes  If so, are there any without handrails? No  Home free of loose throw rugs in walkways, pet beds, electrical cords, etc? Yes  Adequate lighting in your home to reduce risk of falls? Yes   ASSISTIVE DEVICES UTILIZED TO PREVENT FALLS:  Life alert? No  Use of a cane, walker or w/c? No  Grab bars in the bathroom? No  Shower chair or bench in shower? No  Elevated toilet seat or a handicapped toilet? No   TIMED UP AND GO:  Was the test performed? Yes .  Length of time to ambulate 10 feet: 5 sec.   Gait steady and fast without use of assistive device  Cognitive Function:     6CIT Screen 02/27/2020 02/21/2019 02/11/2018 02/05/2017  What Year? 0 points 0 points 0 points 0 points  What month? 0 points 0 points 0 points 0 points  What time? 0 points 0 points 0 points 0 points  Count back from 20 0 points 0 points 0 points 0 points  Months in reverse 0 points 0 points 0 points 0 points  Repeat phrase 2 points 2 points 0 points 8 points  Total Score 2 2 0 8    Immunizations Immunization History  Administered Date(s) Administered   Fluad Quad(high Dose 65+) 05/02/2019, 05/02/2020   Influenza, High Dose Seasonal PF 05/21/2016, 05/07/2017, 04/27/2018    Influenza-Unspecified 04/12/2014, 04/27/2015   Moderna SARS-COV2 Booster Vaccination 10/24/2020   Moderna Sars-Covid-2 Vaccination 08/23/2019, 09/20/2019, 05/08/2020   Pneumococcal Conjugate-13 02/05/2017   Pneumococcal Polysaccharide-23 03/08/2012, 02/11/2018   Tdap 04/03/2010   Zoster, Live 02/05/2016    TDAP status: Due, Education has been provided regarding the importance of this vaccine. Advised may receive this vaccine at local pharmacy or Health Dept. Aware to provide a copy of the vaccination record if obtained from local pharmacy or Health Dept. Verbalized acceptance and understanding.  Flu Vaccine status: Up to date  Pneumococcal vaccine status: Up to date  Covid-19 vaccine status: Completed vaccines  Qualifies for Shingles Vaccine? Yes   Zostavax completed Yes   Shingrix Completed?: No.    Education has been provided regarding the importance of this vaccine. Patient has been advised to call insurance company to determine out of pocket expense if they have not yet received this vaccine. Advised may also receive vaccine at local pharmacy or Health Dept. Verbalized acceptance and understanding.  Screening Tests Health Maintenance  Topic Date Due   COVID-19 Vaccine (5 - Booster for Moderna series) 02/23/2021   INFLUENZA VACCINE  02/10/2021   Zoster Vaccines- Shingrix (1 of 2) 03/29/2021 (Originally 04/21/1970)   TETANUS/TDAP  05/01/2021 (Originally 04/03/2020)   COLONOSCOPY (Pts 45-15yr Insurance coverage will need to be confirmed)  01/04/2025   Hepatitis C Screening  Completed   PNA vac Low Risk Adult  Completed   HPV VACCINES  Aged Out    Health Maintenance  Health Maintenance Due  Topic Date Due   COVID-19 Vaccine (5 - Booster for Moderna series) 02/23/2021   INFLUENZA VACCINE  02/10/2021    Colorectal cancer screening: Type of screening: Colonoscopy. Completed 01/05/20. Repeat every 5 years  Lung Cancer Screening: (Low Dose CT Chest recommended if Age 70-80 years, 30 pack-year currently smoking OR have quit w/in 15years.) does qualify.  Completed 10/03/20  Additional Screening:  Hepatitis C Screening: does qualify; Completed 03/15/12  Vision Screening: Recommended annual ophthalmology exams for early detection of glaucoma and other disorders of the eye. Is the patient up to date with their annual eye exam?  Yes  Who is the provider or what is the name of the office in which the patient attends annual eye exams? [facility]   Dental Screening: Recommended annual dental exams for proper oral hygiene  Community Resource Referral / Chronic Care Management: CRR required this visit?  No   CCM required this visit?  No      Plan:     I have personally reviewed and noted the following in the patient's chart:   Medical and social history Use of alcohol, tobacco or illicit drugs  Current medications and supplements including opioid prescriptions. Patient is not currently taking opioid prescriptions. Functional ability and status Nutritional status Physical activity Advanced directives List of other physicians Hospitalizations, surgeries, and ER visits in previous 12 months Vitals Screenings to include cognitive, depression, and falls Referrals and appointments  In addition, I have reviewed and discussed with patient certain preventive protocols, quality metrics, and best practice recommendations. A written personalized care plan for preventive services as well as general preventive health recommendations were provided to patient.     KClemetine Marker LPN   [id number]  Nurse Notes: none

## 2021-02-27 NOTE — Patient Instructions (Signed)
Ernest Sweeney , Thank you for taking time to come for your Medicare Wellness Visit. I appreciate your ongoing commitment to your health goals. Please review the following plan we discussed and let me know if I can assist you in the future.   Screening recommendations/referrals: Colonoscopy: done 01/05/20. Repeat 12/2024 Recommended yearly ophthalmology/optometry visit for glaucoma screening and checkup Recommended yearly dental visit for hygiene and checkup  Vaccinations: Influenza vaccine: done 05/02/20 Pneumococcal vaccine: done 02/11/18 Tdap vaccine: due Shingles vaccine: Shingrix discussed. Please contact your pharmacy for coverage information.  Covid-19:  done 08/23/19, 09/20/19, 05/08/20 & 10/24/20  Advanced directives: Please bring a copy of your health care power of attorney and living will to the office at your convenience once you have completed that paperwork.   Conditions/risks identified: Keep up the great work!  Next appointment: Follow up in one year for your annual wellness visit.   Preventive Care 84 Years and Older, Male Preventive care refers to lifestyle choices and visits with your health care provider that can promote health and wellness. What does preventive care include? A yearly physical exam. This is also called an annual well check. Dental exams once or twice a year. Routine eye exams. Ask your health care provider how often you should have your eyes checked. Personal lifestyle choices, including: Daily care of your teeth and gums. Regular physical activity. Eating a healthy diet. Avoiding tobacco and drug use. Limiting alcohol use. Practicing safe sex. Taking low doses of aspirin every day. Taking vitamin and mineral supplements as recommended by your health care provider. What happens during an annual well check? The services and screenings done by your health care provider during your annual well check will depend on your age, overall health, lifestyle risk  factors, and family history of disease. Counseling  Your health care provider may ask you questions about your: Alcohol use. Tobacco use. Drug use. Emotional well-being. Home and relationship well-being. Sexual activity. Eating habits. History of falls. Memory and ability to understand (cognition). Work and work Statistician. Screening  You may have the following tests or measurements: Height, weight, and BMI. Blood pressure. Lipid and cholesterol levels. These may be checked every 5 years, or more frequently if you are over 40 years old. Skin check. Lung cancer screening. You may have this screening every year starting at age 70 if you have a 30-pack-year history of smoking and currently smoke or have quit within the past 15 years. Fecal occult blood test (FOBT) of the stool. You may have this test every year starting at age 70. Flexible sigmoidoscopy or colonoscopy. You may have a sigmoidoscopy every 5 years or a colonoscopy every 10 years starting at age 48. Prostate cancer screening. Recommendations will vary depending on your family history and other risks. Hepatitis C blood test. Hepatitis B blood test. Sexually transmitted disease (STD) testing. Diabetes screening. This is done by checking your blood sugar (glucose) after you have not eaten for a while (fasting). You may have this done every 1-3 years. Abdominal aortic aneurysm (AAA) screening. You may need this if you are a current or former smoker. Osteoporosis. You may be screened starting at age 59 if you are at high risk. Talk with your health care provider about your test results, treatment options, and if necessary, the need for more tests. Vaccines  Your health care provider may recommend certain vaccines, such as: Influenza vaccine. This is recommended every year. Tetanus, diphtheria, and acellular pertussis (Tdap, Td) vaccine. You may need a Td  booster every 10 years. Zoster vaccine. You may need this after age  89. Pneumococcal 13-valent conjugate (PCV13) vaccine. One dose is recommended after age 75. Pneumococcal polysaccharide (PPSV23) vaccine. One dose is recommended after age 88. Talk to your health care provider about which screenings and vaccines you need and how often you need them. This information is not intended to replace advice given to you by your health care provider. Make sure you discuss any questions you have with your health care provider. Document Released: 07/26/2015 Document Revised: 03/18/2016 Document Reviewed: 04/30/2015 Elsevier Interactive Patient Education  2017 [place] Prevention in the Home Falls can cause injuries. They can happen to people of all ages. There are many things you can do to make your home safe and to help prevent falls. What can I do on the outside of my home? Regularly fix the edges of walkways and driveways and fix any cracks. Remove anything that might make you trip as you walk through a door, such as a raised step or threshold. Trim any bushes or trees on the path to your home. Use bright outdoor lighting. Clear any walking paths of anything that might make someone trip, such as rocks or tools. Regularly check to see if handrails are loose or broken. Make sure that both sides of any steps have handrails. Any raised decks and porches should have guardrails on the edges. Have any leaves, snow, or ice cleared regularly. Use sand or salt on walking paths during winter. Clean up any spills in your garage right away. This includes oil or grease spills. What can I do in the bathroom? Use night lights. Install grab bars by the toilet and in the tub and shower. Do not use towel bars as grab bars. Use non-skid mats or decals in the tub or shower. If you need to sit down in the shower, use a plastic, non-slip stool. Keep the floor dry. Clean up any water that spills on the floor as soon as it happens. Remove soap buildup in the tub or shower  regularly. Attach bath mats securely with double-sided non-slip rug tape. Do not have throw rugs and other things on the floor that can make you trip. What can I do in the bedroom? Use night lights. Make sure that you have a light by your bed that is easy to reach. Do not use any sheets or blankets that are too big for your bed. They should not hang down onto the floor. Have a firm chair that has side arms. You can use this for support while you get dressed. Do not have throw rugs and other things on the floor that can make you trip. What can I do in the kitchen? Clean up any spills right away. Avoid walking on wet floors. Keep items that you use a lot in easy-to-reach places. If you need to reach something above you, use a strong step stool that has a grab bar. Keep electrical cords out of the way. Do not use floor polish or wax that makes floors slippery. If you must use wax, use non-skid floor wax. Do not have throw rugs and other things on the floor that can make you trip. What can I do with my stairs? Do not leave any items on the stairs. Make sure that there are handrails on both sides of the stairs and use them. Fix handrails that are broken or loose. Make sure that handrails are as long as the stairways. Check any carpeting  to make sure that it is firmly attached to the stairs. Fix any carpet that is loose or worn. Avoid having throw rugs at the top or bottom of the stairs. If you do have throw rugs, attach them to the floor with carpet tape. Make sure that you have a light switch at the top of the stairs and the bottom of the stairs. If you do not have them, ask someone to add them for you. What else can I do to help prevent falls? Wear shoes that: Do not have high heels. Have rubber bottoms. Are comfortable and fit you well. Are closed at the toe. Do not wear sandals. If you use a stepladder: Make sure that it is fully opened. Do not climb a closed stepladder. Make sure that  both sides of the stepladder are locked into place. Ask someone to hold it for you, if possible. Clearly mark and make sure that you can see: Any grab bars or handrails. First and last steps. Where the edge of each step is. Use tools that help you move around (mobility aids) if they are needed. These include: Canes. Walkers. Scooters. Crutches. Turn on the lights when you go into a dark area. Replace any light bulbs as soon as they burn out. Set up your furniture so you have a clear path. Avoid moving your furniture around. If any of your floors are uneven, fix them. If there are any pets around you, be aware of where they are. Review your medicines with your doctor. Some medicines can make you feel dizzy. This can increase your chance of falling. Ask your doctor what other things that you can do to help prevent falls. This information is not intended to replace advice given to you by your health care provider. Make sure you discuss any questions you have with your health care provider. Document Released: 04/25/2009 Document Revised: 12/05/2015 Document Reviewed: 08/03/2014 Elsevier Interactive Patient Education  2017 [employer].

## 2021-02-28 ENCOUNTER — Telehealth: Payer: Self-pay | Admitting: *Deleted

## 2021-02-28 NOTE — Telephone Encounter (Signed)
   Telephone encounter was:  Successful.  02/28/2021 Name: Ernest Sweeney MRN: [id number] DOB: [birth year]  Ernest Sweeney is a 70 y.o. year old male who is a primary care patient of [name], MD . The community resource team was consulted for assistance with Food InsecurityWill put in request for [facility] for remaining 200 food card advised patient to file for extra help as he is paying for his medicare and doesnt need too  Care guide performed the following interventions: Patient provided with information about care guide support team and interviewed to confirm resource needs Follow up call placed to community resources to determine status of patients referral.  Follow Up Plan:  No further follow up planned at this time. The patient has been provided with needed resources.  [place], Care Management  [phone] [address] , [place] [address] Email : [email or web address]

## 2021-04-11 ENCOUNTER — Inpatient Hospital Stay: Payer: Medicare HMO | Attending: Radiation Oncology

## 2021-04-11 DIAGNOSIS — C61 Malignant neoplasm of prostate: Secondary | ICD-10-CM | POA: Insufficient documentation

## 2021-04-11 LAB — PSA: Prostatic Specific Antigen: <0.01 ng/mL (ref 0.00–4.00)

## 2021-04-14 ENCOUNTER — Encounter: Payer: Self-pay | Admitting: Radiation Oncology

## 2021-04-14 ENCOUNTER — Other Ambulatory Visit: Payer: Self-pay | Admitting: *Deleted

## 2021-04-14 ENCOUNTER — Ambulatory Visit
Admission: RE | Admit: 2021-04-14 | Discharge: 2021-04-14 | Disposition: A | Discharge status: Home / Self Care | Payer: Medicare HMO | Source: Ambulatory Visit | Attending: Radiation Oncology | Admitting: Radiation Oncology

## 2021-04-14 VITALS — BP 128/90 | HR 81 | Temp 94.7°F | Resp 16 | Wt 177.4 lb

## 2021-04-14 DIAGNOSIS — C61 Malignant neoplasm of prostate: Secondary | ICD-10-CM | POA: Diagnosis not present

## 2021-04-14 DIAGNOSIS — Z923 Personal history of irradiation: Secondary | ICD-10-CM | POA: Insufficient documentation

## 2021-04-14 DIAGNOSIS — Z08 Encounter for follow-up examination after completed treatment for malignant neoplasm: Secondary | ICD-10-CM | POA: Diagnosis not present

## 2021-04-14 NOTE — Progress Notes (Signed)
Radiation Oncology Follow up Note  Name: Ernest Sweeney   Date:   04/14/2021 MRN:  [id number] DOB: 1951/06/23    This 70 y.o. male presents to the clinic today for 3-year follow-up status post salvage radiation therapy to his prostatic fossa status post robotic prostatectomy for Gleason 7 adenocarcinoma stage IIIb.  REFERRING PROVIDER: [name], MD  HPI: Patient is a 70 year old male now seen out 3 years having completed salvage radiation therapy to his prostatic fossa status post robotic assisted prostatectomy for T3 N0 M0 lesion.  Seen today in routine follow-up he is doing well.  He specifically denies any significant increase in lower urinary tract symptoms diarrhea or fatigue his PSA remains less than 0.01.Marland Kitchen  He had a lung screening back in March which I have reviewed showing no evidence to suggest lung pathology  COMPLICATIONS OF TREATMENT: none  FOLLOW UP COMPLIANCE: keeps appointments   PHYSICAL EXAM:  BP 128/90 (BP Location: Left Arm, Patient Position: Sitting)   Pulse 81   Temp (!) 94.7 F (34.8 C) (Tympanic)   Resp 16   Wt 177 lb 6.4 oz (80.5 kg)   BMI 26.20 kg/m  Well-developed well-nourished patient in NAD. HEENT reveals PERLA, EOMI, discs not visualized.  Oral cavity is clear. No oral mucosal lesions are identified. Neck is clear without evidence of cervical or supraclavicular adenopathy. Lungs are clear to A&P. Cardiac examination is essentially unremarkable with regular rate and rhythm without murmur rub or thrill. Abdomen is benign with no organomegaly or masses noted. Motor sensory and DTR levels are equal and symmetric in the upper and lower extremities. Cranial nerves II through XII are grossly intact. Proprioception is intact. No peripheral adenopathy or edema is identified. No motor or sensory levels are noted. Crude visual fields are within normal range.  RADIOLOGY RESULTS: CT scans reviewed compatible with above-stated findings  PLAN: Present time patient  is doing well under excellent biochemical control of his prostate cancer I am pleased with his overall progress.  I have asked to see him back in 1 year for follow-up with a PSA at that time.  Patient comprehends my recommendations well.  I would like to take this opportunity to thank you for allowing me to participate in the care of your patient.Noreene Filbert, MD

## 2021-04-29 ENCOUNTER — Encounter: Payer: Self-pay | Admitting: General Surgery

## 2021-06-18 ENCOUNTER — Ambulatory Visit: Payer: Self-pay | Admitting: *Deleted

## 2021-06-18 NOTE — Telephone Encounter (Signed)
Reason for Disposition  [1] Face is swollen AND [2] no fever  Answer Assessment - Initial Assessment Questions 1. LOCATION: "Which tooth is hurting?"  (e.g., right-side/left-side, upper/lower, front/back)     R side bottom 2. ONSET: "When did the toothache start?"  (e.g., hours, days)      Sunday 3. SEVERITY: "How bad is the toothache?"  (Scale 1-10; mild, moderate or severe)   - MILD (1-3): doesn't interfere with chewing    - MODERATE (4-7): interferes with chewing, interferes with normal activities, awakens from sleep     - SEVERE (8-10): unable to eat, unable to do any normal activities, excruciating pain        Moderate/severe 4. SWELLING: "Is there any visible swelling of your face?"     Yes- notable swelling 5. OTHER SYMPTOMS: "Do you have any other symptoms?" (e.g., fever)     no 6. PREGNANCY: "Is there any chance you are pregnant?" "When was your last menstrual period?"     na  Protocols used: [facility]

## 2021-06-18 NOTE — Telephone Encounter (Signed)
  Chief Complaint: tooth ache- facial swelling Symptoms: swelling, pain Frequency: started Sunday Pertinent Negatives: Patient denies fever Disposition: [] ED /[x] Urgent Care (no appt availability in office / [] Appointment(In office/virtual)/ []  [place] [facility]/ [] Home Care

## 2021-06-30 NOTE — Progress Notes (Signed)
Name: Ernest Sweeney   MRN: [id number]    DOB: September 28, 1950   Date:07/01/2021       Progress Note  Subjective  Chief Complaint  Follow Up  HPI  Leucopenia: last WBC was stable at 2.7 , we will recheck it today    Depression: seen January 2020 and phq 9 was very high at 99, we started him on Zofot 50 mg but he  stopped Zofot because he read the side effects, he has been off medication for a while now and pqh 9 is negative, he is working two part time jobs, two different cleaning jobs. Feeling well    Alcohol use disorder: he always drank, used to beer when he was younger, but since his mid 20's he started to drink liquor, since his 54's he has been drinking heavier, mostly on weekends and worse since COVID-19, drinking a pint and a half of liquor on weekends and some beer. He states dizziness resolved unless he stands quickly , not necessarily with alcohol intake. He is now drinking half a pint on weekends now. Only drinking a couple beers at night on weekdays.    HTN: he was getting dizzy on 30 mg of Lisinopril , he is now taking 20 mg and bp is at goal, no chest pain, palpitation and dizziness is seldom when stooping down or getting up quickly. Advised to stay hydrated    Chronic bronchitis/Emphysema : he used Breo in the past but he stopped medication on his own, quit smoking in 2016 and is gradually getting better.  No sob or wheezing. He has occasional cough but is mild now.    Hyperlipidemia/Atherosclerosis of Aorta: taking aspirin and rosuvastatin. Last LDL done 04/2020 down from 80 to 64  He has atherosclerosis of coronaries but not chest pain or palpitation. He  was seen by Dr. Clayborn Bigness and diagnosed with CAD without angina. Continue medical management    Insomnia: he states no longer taking medication, he states he has been sleeping over 6 hours per night without medication and wakes up feeling rested. Usually gets up on his own around 6 am  History of prostate cancer dx 2016 : last  PSA was normal seeing Dr. Baruch Gouty every 12  months now. He denies urinary urgency and nocturia has resolved . Unchanged   Patient Active Problem List   Diagnosis Date Noted   Right inguinal hernia    Alcoholism ([place]) 06/28/2020   Major depression in remission ([place]) 06/28/2020   Lung nodules 09/04/2018   Chronic bronchitis ([place]) 04/23/2017   Atherosclerosis of aorta ([place]) 04/23/2017   Coronary artery disease due to calcified coronary lesion 04/23/2017   Glaucoma, left eye 08/04/2016   Prostate cancer ([place]) 07/15/2016   History of prostatectomy 07/15/2016   Vitamin D deficiency 05/31/2015   Dyslipidemia 05/31/2015   Seasonal allergic rhinitis 05/31/2015   Chronic pain of both shoulders 05/31/2015   Family history of malignant neoplasm of prostate 07/26/2012   Hematuria, microscopic 07/26/2012    Past Surgical History:  Procedure Laterality Date   COLONOSCOPY  2010   Dr Sula Rumple   COLONOSCOPY WITH PROPOFOL N/A 01/05/2020   Procedure: COLONOSCOPY WITH PROPOFOL;  Surgeon: [name], MD;  Location: [facility] ENDOSCOPY;  Service: Gastroenterology;  Laterality: N/A;   excision skin cyst  06-19-14   Dr. Festus Aloe CYST   INGUINAL HERNIA REPAIR Right 09/06/2020   Procedure: HERNIA REPAIR RIGH  INGUINAL WITH MESH ADULT, open with RNFA to assist;  Surgeon: Fredirick Maudlin, MD;  Location:  [facility] ORS;  Service: General;  Laterality: Right;  Provider requesting 2 hours/120 minutes for procedure.   PELVIC LYMPH NODE DISSECTION N/A 07/15/2016   Procedure: PELVIC LYMPH NODE DISSECTION;  Surgeon: Nickie Retort, MD;  Location: [facility] ORS;  Service: Urology;  Laterality: N/A;   ROBOT ASSISTED LAPAROSCOPIC RADICAL PROSTATECTOMY N/A 07/15/2016   Procedure: ROBOTIC ASSISTED LAPAROSCOPIC RADICAL PROSTATECTOMY;  Surgeon: Nickie Retort, MD;  Location: [facility] ORS;  Service: Urology;  Laterality: N/A;    Family History  Problem Relation Age of Onset   Heart disease Mother    Heart attack Mother     Diabetes Father    Heart attack Father    Dementia Father    Cancer Sister        Breast   Cancer Brother        Prostate   Healthy Sister    Stroke Brother     Social History   Tobacco Use   Smoking status: Former    Packs/day: 0.50    Years: 40.00    Pack years: 20.00    Types: Cigarettes    Quit date: 03/23/2016    Years since quitting: 5.2   Smokeless tobacco: Never  Substance Use Topics   Alcohol use: Yes    Alcohol/week: 2.0 - 4.0 standard drinks    Types: 2 - 4 Standard drinks or equivalent per week    Comment: 1-2 times per week     Current Outpatient Medications:    aspirin 81 MG tablet, Take 81 mg by mouth daily., Disp: , Rfl:    brimonidine-timolol (COMBIGAN) 0.2-0.5 % ophthalmic solution, Place 1 drop into both eyes every 12 (twelve) hours., Disp: , Rfl:    Cholecalciferol (VITAMIN D) 2000 units CAPS, Take 1 capsule (2,000 Units total) by mouth daily., Disp: 30 capsule, Rfl: 0   fluticasone (FLONASE) 50 MCG/ACT nasal spray, Place 2 sprays into both nostrils daily., Disp: 16 g, Rfl: 6   latanoprost (XALATAN) 0.005 % ophthalmic solution, Place 1 drop into both eyes at bedtime. , Disp: , Rfl:    lisinopril (ZESTRIL) 10 MG tablet, Take 1-2 tablets (10-20 mg total) by mouth 2 (two) times daily. 20 mg in am and one in pm, Disp: 270 tablet, Rfl: 1   rosuvastatin (CRESTOR) 40 MG tablet, Take 1 tablet (40 mg total) by mouth daily. In place of Atorvastatin, Disp: 90 tablet, Rfl: 1   sildenafil (VIAGRA) 100 MG tablet, Take 1 tablet (100 mg total) by mouth daily as needed for erectile dysfunction., Disp: 30 tablet, Rfl: 0   triamcinolone cream (KENALOG) 0.1 %, APPLY  CREAM EXTERNALLY TWICE DAILY, Disp: 45 g, Rfl: 0  No Known Allergies  I personally reviewed active problem list, medication list, allergies, family history, social history, health maintenance with the patient/caregiver today.   ROS  Constitutional: Negative for fever or weight change.  Respiratory:  Negative for cough and shortness of breath.   Cardiovascular: Negative for chest pain or palpitations.  Gastrointestinal: Negative for abdominal pain, no bowel changes.  Musculoskeletal: Negative for gait problem or joint swelling.  Skin: Negative for rash.  Neurological: Negative for dizziness or headache.  No other specific complaints in a complete review of systems (except as listed in HPI above).   Objective  Vitals:   07/01/21 0751  BP: 132/84  Pulse: 91  Resp: 16  Temp: 97.6 F (36.4 C)  SpO2: 99%  Weight: 178 lb (80.7 kg)  Height: 5\' 9"  (1.753 m)    Body  mass index is 26.29 kg/m.  Physical Exam  Constitutional: Patient appears well-developed and well-nourished.  No distress.  HEENT: head atraumatic, normocephalic, pupils equal and reactive to light, neck supple Cardiovascular: Normal rate, regular rhythm and normal heart sounds.  No murmur heard. No BLE edema. Pulmonary/Chest: Effort normal and breath sounds normal. No respiratory distress. Abdominal: Soft.  There is no tenderness. Psychiatric: Patient has a normal mood and affect. behavior is normal. Judgment and thought content normal.   Recent Results (from the past 2160 hour(s))  PSA     Status: None   Collection Time: 04/11/21  8:39 AM  Result Value Ref Range   Prostatic Specific Antigen <0.01 0.00 - 4.00 ng/mL    Comment: (NOTE) While PSA levels of <=4.0 ng/ml are reported as reference range, some men with levels below 4.0 ng/ml can have prostate cancer and many men with PSA above 4.0 ng/ml do not have prostate cancer.  Other tests such as free PSA, age specific reference ranges, PSA velocity and PSA doubling time may be helpful especially in men less than 30 years old. Performed at [facility], [place] [address] [place], [place] [address]      PHQ2/9: Depression screen [facility] 2/9 07/01/2021 02/27/2021 12/30/2020 11/14/2020 08/08/2020  Decreased Interest 0 0 0 0 0  Down, Depressed, Hopeless 0 0 0 0 0   PHQ - 2 Score 0 0 0 0 0  Altered sleeping 0 - 0 - 0  Tired, decreased energy 0 - 0 - 0  Change in appetite 0 - 1 - 0  Feeling bad or failure about yourself  0 - 0 - 0  Trouble concentrating 0 - 0 - 0  Moving slowly or fidgety/restless 0 - 0 - 0  Suicidal thoughts 0 - 0 - 0  PHQ-9 Score 0 - 1 - 0  Difficult doing work/chores - - - - -  Some recent data might be hidden    phq 9 is negative   Fall Risk: Fall Risk  07/01/2021 02/27/2021 12/30/2020 11/14/2020 08/08/2020  Falls in the past year? 0 0 0 0 0  Number falls in past yr: 0 0 0 0 0  Injury with Fall? 0 0 0 - 0  Risk for fall due to : No Fall Risks No Fall Risks - - -  Risk for fall due to: Comment - - - - -  Follow up Falls prevention discussed Falls prevention discussed - Falls prevention discussed -      Functional Status Survey: Is the patient deaf or have difficulty hearing?: No Does the patient have difficulty seeing, even when wearing glasses/contacts?: No Does the patient have difficulty concentrating, remembering, or making decisions?: No Does the patient have difficulty walking or climbing stairs?: No Does the patient have difficulty dressing or bathing?: No Does the patient have difficulty doing errands alone such as visiting a doctor's office or shopping?: No    Assessment & Plan  1. Centrilobular emphysema ([place])  Not taking medication since doing better clinically   2. Atherosclerosis of aorta (H[facility])  - Lipid panel - rosuvastatin (CRESTOR) 40 MG tablet; Take 1 tablet (40 mg total) by mouth daily. In place of Atorvastatin  Dispense: 90 tablet; Refill: 1  3. Alcoholism ([place])  Trying to cut down on the amount   4. Major depression in remission [facility])  He was feeling very down when diagnosed with prostate cancer in 2017 and it lasted over one year, but feeling better now  5. Other neutropenia ([facility])  - CBC with Differential/Platelet  6. Simple chronic bronchitis ([place])  Doing well, no longer smoking    7. Dyslipidemia  - Lipid panel  8. Vitamin D deficiency  - VITAMIN D 25 Hydroxy (Vit-D Deficiency, Fractures)  9. History of prostatectomy   10. Atherosclerosis of native coronary artery of native heart without angina pectoris   11. Essential hypertension  - CBC with Differential/Platelet - COMPLETE METABOLIC PANEL WITH GFR - lisinopril (ZESTRIL) 20 MG tablet; Take 1 tablet (20 mg total) by mouth daily.  Dispense: 90 tablet; Refill: 1  12. Hyperglycemia  - Hemoglobin A1c  13. Need for Tdap vaccination  - Zoster Vaccine Adjuvanted [facility]) injection; Inject 0.5 mLs into the muscle once for 1 dose.  Dispense: 0.5 mL; Refill: 1  14. Need for shingles vaccine  - Tdap (ADACEL) 11-11-13.5 LF-MCG/0.5 injection; Inject 0.5 mLs into the muscle once for 1 dose.  Dispense: 0.5 mL; Refill: 0  15. Perennial allergic rhinitis  - fluticasone (FLONASE) 50 MCG/ACT nasal spray; Place 2 sprays into both nostrils daily.  Dispense: 16 g; Refill: 6

## 2021-07-01 ENCOUNTER — Ambulatory Visit (INDEPENDENT_AMBULATORY_CARE_PROVIDER_SITE_OTHER): Payer: Medicare HMO | Admitting: Family Medicine

## 2021-07-01 ENCOUNTER — Encounter: Payer: Self-pay | Admitting: Family Medicine

## 2021-07-01 VITALS — BP 132/84 | HR 91 | Temp 97.6°F | Resp 16 | Ht 69.0 in | Wt 178.0 lb

## 2021-07-01 DIAGNOSIS — F1020 Alcohol dependence, uncomplicated: Secondary | ICD-10-CM | POA: Diagnosis not present

## 2021-07-01 DIAGNOSIS — E785 Hyperlipidemia, unspecified: Secondary | ICD-10-CM

## 2021-07-01 DIAGNOSIS — J410 Simple chronic bronchitis: Secondary | ICD-10-CM | POA: Diagnosis not present

## 2021-07-01 DIAGNOSIS — J432 Centrilobular emphysema: Secondary | ICD-10-CM

## 2021-07-01 DIAGNOSIS — E559 Vitamin D deficiency, unspecified: Secondary | ICD-10-CM

## 2021-07-01 DIAGNOSIS — F325 Major depressive disorder, single episode, in full remission: Secondary | ICD-10-CM

## 2021-07-01 DIAGNOSIS — R739 Hyperglycemia, unspecified: Secondary | ICD-10-CM | POA: Diagnosis not present

## 2021-07-01 DIAGNOSIS — D708 Other neutropenia: Secondary | ICD-10-CM

## 2021-07-01 DIAGNOSIS — I700 Atherosclerosis of aorta: Secondary | ICD-10-CM

## 2021-07-01 DIAGNOSIS — Z9079 Acquired absence of other genital organ(s): Secondary | ICD-10-CM | POA: Diagnosis not present

## 2021-07-01 DIAGNOSIS — I10 Essential (primary) hypertension: Secondary | ICD-10-CM

## 2021-07-01 DIAGNOSIS — I2510 Atherosclerotic heart disease of native coronary artery without angina pectoris: Secondary | ICD-10-CM

## 2021-07-01 DIAGNOSIS — J3089 Other allergic rhinitis: Secondary | ICD-10-CM

## 2021-07-01 DIAGNOSIS — R69 Illness, unspecified: Secondary | ICD-10-CM | POA: Diagnosis not present

## 2021-07-01 DIAGNOSIS — Z23 Encounter for immunization: Secondary | ICD-10-CM

## 2021-07-01 MED ORDER — ROSUVASTATIN CALCIUM 40 MG PO TABS
40.0000 mg | ORAL_TABLET | Freq: Every day | ORAL | 1 refills | Status: DC
Start: 2021-07-01 — End: 2021-12-30

## 2021-07-01 MED ORDER — TETANUS-DIPHTH-ACELL PERTUSSIS 5-2-15.5 LF-MCG/0.5 IM SUSP: 0.5000 mL | Freq: Once | INTRAMUSCULAR | 0 refills | Status: AC

## 2021-07-01 MED ORDER — FLUTICASONE PROPIONATE 50 MCG/ACT NA SUSP: 2.0000 | Freq: Every day | NASAL | 6 refills | Status: DC

## 2021-07-01 MED ORDER — LISINOPRIL 20 MG PO TABS
20.0000 mg | ORAL_TABLET | Freq: Every day | ORAL | 1 refills | Status: DC
Start: 2021-07-01 — End: 2021-12-30

## 2021-07-01 MED ORDER — SHINGRIX 50 MCG/0.5ML IM SUSR: 0.5000 mL | Freq: Once | INTRAMUSCULAR | 1 refills | Status: AC

## 2021-07-02 LAB — CBC WITH DIFFERENTIAL/PLATELET
Absolute Monocytes: 386 cells/uL (ref 200–950)
Basophils Absolute: 11 cells/uL (ref 0–200)
Basophils Relative: 0.4 %
Eosinophils Absolute: 42 cells/uL (ref 15–500)
Eosinophils Relative: 1.5 %
HCT: 45.6 % (ref 38.5–50.0)
Hemoglobin: 15.5 g/dL (ref 13.2–17.1)
Lymphs Abs: 885 cells/uL (ref 850–3900)
MCH: 33.5 pg — ABNORMAL HIGH (ref 27.0–33.0)
MCHC: 34 g/dL (ref 32.0–36.0)
MCV: 98.5 fL (ref 80.0–100.0)
MPV: 9.5 fL (ref 7.5–12.5)
Monocytes Relative: 13.8 %
Neutro Abs: 1476 cells/uL — ABNORMAL LOW (ref 1500–7800)
Neutrophils Relative %: 52.7 %
Platelets: 352 10*3/uL (ref 140–400)
RBC: 4.63 10*6/uL (ref 4.20–5.80)
RDW: 11.8 % (ref 11.0–15.0)
Total Lymphocyte: 31.6 %
WBC: 2.8 10*3/uL — ABNORMAL LOW (ref 3.8–10.8)

## 2021-07-02 LAB — LIPID PANEL
Cholesterol: 168 mg/dL (ref <200)
HDL: 78 mg/dL (ref >=40)
LDL Cholesterol (Calc): 76 mg/dL (calc)
Non-HDL Cholesterol (Calc): 90 mg/dL (calc) (ref <130)
Total CHOL/HDL Ratio: 2.2 (calc) (ref <5.0)
Triglycerides: 59 mg/dL (ref <150)

## 2021-07-02 LAB — COMPLETE METABOLIC PANEL WITH GFR
AG Ratio: 2.1 (calc) (ref 1.0–2.5)
ALT: 17 U/L (ref 9–46)
AST: 19 U/L (ref 10–35)
Albumin: 4.8 g/dL (ref 3.6–5.1)
Alkaline phosphatase (APISO): 53 U/L (ref 35–144)
BUN: 14 mg/dL (ref 7–25)
CO2: 31 mmol/L (ref 20–32)
Calcium: 10.2 mg/dL (ref 8.6–10.3)
Chloride: 102 mmol/L (ref 98–110)
Creat: 1.26 mg/dL (ref 0.70–1.28)
Globulin: 2.3 g/dL (calc) (ref 1.9–3.7)
Glucose, Bld: 93 mg/dL (ref 65–99)
Potassium: 5.1 mmol/L (ref 3.5–5.3)
Sodium: 138 mmol/L (ref 135–146)
Total Bilirubin: 0.8 mg/dL (ref 0.2–1.2)
Total Protein: 7.1 g/dL (ref 6.1–8.1)
eGFR: 61 mL/min/{1.73_m2} (ref >=60)

## 2021-07-02 LAB — HEMOGLOBIN A1C
Hgb A1c MFr Bld: 5.5 % of total Hgb (ref <5.7)
Mean Plasma Glucose: 111 mg/dL
eAG (mmol/L): 6.2 mmol/L

## 2021-07-02 LAB — VITAMIN D 25 HYDROXY (VIT D DEFICIENCY, FRACTURES): Vit D, 25-Hydroxy: 43 ng/mL (ref 30–100)

## 2021-07-11 ENCOUNTER — Encounter: Payer: Self-pay | Admitting: Urology

## 2021-08-07 ENCOUNTER — Telehealth: Payer: Self-pay | Admitting: Family Medicine

## 2021-08-07 DIAGNOSIS — I10 Essential (primary) hypertension: Secondary | ICD-10-CM

## 2021-08-07 NOTE — Telephone Encounter (Signed)
Pt states he is currently taking Lisinopril 10mg  BID in the morning.

## 2021-08-07 NOTE — Telephone Encounter (Signed)
Dose inconsistent with current med list. Requested Prescriptions  Refused Prescriptions Disp Refills   lisinopril (ZESTRIL) 10 MG tablet [Pharmacy Med Name: Lisinopril 10 MG Oral Tablet] 270 tablet 0    Sig: TAKE 2 TABLETS BY MOUTH IN THE MORNING AND 1 IN THE EVENING     Cardiovascular:  ACE Inhibitors Passed - 08/07/2021  2:41 AM      Passed - Cr in normal range and within 180 days    Creat  Date Value Ref Range Status  07/01/2021 1.26 0.70 - 1.28 mg/dL Final         Passed - K in normal range and within 180 days    Potassium  Date Value Ref Range Status  07/01/2021 5.1 3.5 - 5.3 mmol/L Final  11/30/2012 3.9 3.5 - 5.1 mmol/L Final         Passed - Patient is not pregnant      Passed - Last BP in normal range    BP Readings from Last 1 Encounters:  07/01/21 132/84         Passed - Valid encounter within last 6 months    Recent Outpatient Visits          1 month ago Centrilobular emphysema [facility])   [facility] [name], MD   7 months ago Centrilobular emphysema [facility])   [facility] [name], MD   12 months ago Hernia, inguinal, right   [facility] [name], MD   1 year ago Essential hypertension   [facility] [name], MD   1 year ago Well adult exam   [facility] [name], MD      Future Appointments            In 4 months Ancil Boozer, Drue Stager, MD [facility] [place]   In 6 months  [facility], [facility]

## 2021-08-07 NOTE — Telephone Encounter (Signed)
Pt states he made a mistake and is take 20mg . Patient states he has 2 bottles of 20 mg and 1 bottle of 10 mg

## 2021-08-07 NOTE — Telephone Encounter (Signed)
Called back pt and he states he ONLY has been taking till this day the 10 mg tablets but he takes 2 tablets daily in the morning.  He states he has not touched any of the 20 mg tablets yet.

## 2021-08-08 ENCOUNTER — Other Ambulatory Visit: Payer: Self-pay | Admitting: Family Medicine

## 2021-08-13 DIAGNOSIS — H401132 Primary open-angle glaucoma, bilateral, moderate stage: Secondary | ICD-10-CM | POA: Diagnosis not present

## 2021-09-18 ENCOUNTER — Other Ambulatory Visit: Payer: Self-pay

## 2021-09-18 DIAGNOSIS — C61 Malignant neoplasm of prostate: Secondary | ICD-10-CM

## 2021-09-19 ENCOUNTER — Other Ambulatory Visit: Payer: Self-pay

## 2021-09-19 ENCOUNTER — Other Ambulatory Visit: Payer: Medicare HMO

## 2021-09-19 DIAGNOSIS — C61 Malignant neoplasm of prostate: Secondary | ICD-10-CM | POA: Diagnosis not present

## 2021-09-20 LAB — PSA: Prostate Specific Ag, Serum: <0.1 ng/mL (ref 0.0–4.0)

## 2021-09-24 ENCOUNTER — Encounter: Payer: Self-pay | Admitting: Urology

## 2021-09-24 ENCOUNTER — Ambulatory Visit: Payer: Medicare HMO | Admitting: Urology

## 2021-09-24 ENCOUNTER — Other Ambulatory Visit: Payer: Self-pay

## 2021-09-24 VITALS — BP 129/88 | HR 83 | Ht 69.0 in | Wt 180.6 lb

## 2021-09-24 DIAGNOSIS — C61 Malignant neoplasm of prostate: Secondary | ICD-10-CM

## 2021-09-24 DIAGNOSIS — Z9079 Acquired absence of other genital organ(s): Secondary | ICD-10-CM | POA: Diagnosis not present

## 2021-09-24 DIAGNOSIS — N529 Male erectile dysfunction, unspecified: Secondary | ICD-10-CM

## 2021-09-24 MED ORDER — SILDENAFIL CITRATE 100 MG PO TABS: 100.0000 mg | ORAL_TABLET | Freq: Every day | ORAL | 6 refills | Status: DC | PRN

## 2021-09-24 NOTE — Patient Instructions (Signed)
Kegel Exercises ?Kegel exercises can help strengthen your pelvic floor muscles. The pelvic floor is a group of muscles that support your rectum, small intestine, and bladder. In females, pelvic floor muscles also help support the uterus. These muscles help you control the flow of urine and stool (feces). ?Kegel exercises are painless and simple. They do not require any equipment. Your provider may suggest Kegel exercises to: ?Improve bladder and bowel control. ?Improve sexual response. ?Improve weak pelvic floor muscles after surgery to remove the uterus (hysterectomy) or after pregnancy, in females. ?Improve weak pelvic floor muscles after prostate gland removal or surgery, in males. ?Kegel exercises involve squeezing your pelvic floor muscles. These are the same muscles you squeeze when you try to stop the flow of urine or keep from passing gas. The exercises can be done while sitting, standing, or lying down, but it is best to vary your position. ?Ask your health care provider which exercises are safe for you. Do exercises exactly as told by your health care provider and adjust them as directed. Do not begin these exercises until told by your health care provider. ?Exercises ?How to do Kegel exercises: ?Squeeze your pelvic floor muscles tight. You should feel a tight lift in your rectal area. If you are a male, you should also feel a tightness in your vaginal area. Keep your stomach, buttocks, and legs relaxed. ?Hold the muscles tight for up to 10 seconds. ?Breathe normally. ?Relax your muscles for up to 10 seconds. ?Repeat as told by your health care provider. ?Repeat this exercise daily as told by your health care provider. Continue to do this exercise for at least 4-6 weeks, or for as long as told by your health care provider. ?You may be referred to a physical therapist who can help you learn more about how to do Kegel exercises. ?Depending on your condition, your health care provider may  recommend: ?Varying how long you squeeze your muscles. ?Doing several sets of exercises every day. ?Doing exercises for several weeks. ?Making Kegel exercises a part of your regular exercise routine. ?This information is not intended to replace advice given to you by your health care provider. Make sure you discuss any questions you have with your health care provider. ?Document Revised: 11/07/2020 Document Reviewed: 11/07/2020 ?Elsevier Patient Education ? [place]. ? ?

## 2021-09-24 NOTE — Progress Notes (Signed)
? ?  09/24/2021 ?10:08 AM  ? ?Ernest Sweeney ?February 01, 1951 ?[id number] ? ?Reason for visit: Follow up prostate cancer, ED, stress incontinence ?  ?HPI: ?I saw [name] in urology clinic today for prostate cancer follow-up.  To briefly summarize, he is a healthy 71 year old African-American male that underwent a robotic prostatectomy and bilateral lymphadenectomy in January 2018 with Dr. Pilar Jarvis.  Final pathology showed Gleason score 3+4=7 prostate cancer stage pT3aN0 Mx, there was focal extraprostatic extension and perineural invasion, margins were negative.  Pretreatment PSA was 6, and PSA after surgery was undetectable. ?  ?He developed biochemical recurrence in August 2019 with a PSA of 0.2 x2, and underwent salvage radiation to the prostatic fossa and 6 months of ADT(03/08/2018). ?  ?His most recent PSA 09/19/21 remains undetectable. ?  ?Overall he continues to do well. Minimally bothersome stress urinary incontinence with heavy coughing or sneezing, no pads needed.  We discussed Kegel exercises, as well as consideration of a Cunningham clamp in the future if worsening incontinence. ? ?He is able to get erections with 100 mg sildenafil on demand, needs refill. ?  ?We had a long conversation again about biochemical recurrence after prostatectomy, and the need for close PSA surveillance after salvage radiation.  We also reviewed the plan of monitoring the PSA, with consideration of observation versus other hormonal therapies if PSA recurrence in the future.   ? ?Sildenafil 100 mg refilled ?RTC 1 year for PSA ? ?Billey Co, MD ? ?[place] ?[address] ?[place], [place] [address] ?[phone] ? ? ? ?

## 2021-09-25 ENCOUNTER — Ambulatory Visit: Payer: Self-pay | Admitting: Urology

## 2021-10-20 DIAGNOSIS — H43811 Vitreous degeneration, right eye: Secondary | ICD-10-CM | POA: Diagnosis not present

## 2021-11-03 ENCOUNTER — Other Ambulatory Visit: Payer: Self-pay | Admitting: *Deleted

## 2021-11-03 DIAGNOSIS — Z122 Encounter for screening for malignant neoplasm of respiratory organs: Secondary | ICD-10-CM

## 2021-11-03 DIAGNOSIS — Z87891 Personal history of nicotine dependence: Secondary | ICD-10-CM

## 2021-11-03 NOTE — Progress Notes (Signed)
che

## 2021-11-11 ENCOUNTER — Ambulatory Visit (INDEPENDENT_AMBULATORY_CARE_PROVIDER_SITE_OTHER): Payer: Medicare HMO | Admitting: Internal Medicine

## 2021-11-11 ENCOUNTER — Encounter: Payer: Self-pay | Admitting: Internal Medicine

## 2021-11-11 VITALS — BP 116/80 | HR 104 | Temp 97.9°F | Resp 18 | Ht 69.0 in | Wt 175.6 lb

## 2021-11-11 DIAGNOSIS — R051 Acute cough: Secondary | ICD-10-CM

## 2021-11-11 DIAGNOSIS — R0981 Nasal congestion: Secondary | ICD-10-CM | POA: Diagnosis not present

## 2021-11-11 MED ORDER — BENZONATATE 100 MG PO CAPS
100.0000 mg | ORAL_CAPSULE | Freq: Two times a day (BID) | ORAL | 0 refills | Status: DC | PRN
Start: 2021-11-11 — End: 2021-12-30

## 2021-11-11 MED ORDER — FLUTICASONE PROPIONATE 50 MCG/ACT NA SUSP: 2.0000 | Freq: Every day | NASAL | 6 refills | Status: DC

## 2021-11-11 MED ORDER — CORICIDIN HBP MAX STRENGTH FLU 10-325-2 MG PO TABS: 1.0000 | ORAL_TABLET | Freq: Every day | ORAL | 0 refills | Status: DC | PRN

## 2021-11-11 NOTE — Progress Notes (Signed)
? ?Acute Office Visit ? ?Subjective:  ? ?  ?Patient ID: Ernest Sweeney, male    DOB: June 08, 1951, 71 y.o.   MRN: [id number] ? ?Chief Complaint  ?Patient presents with  ? URI  ?  Cough, headache, body aches, loss of taste  ? ? ?HPI ?Patient is in today for cough, headache, congestion, decreased taste. Symptoms started 5 days ago on 12/07/21. He states that yesterday they started getting better and today he is feeling better as well.  ? ?URI Compliant:  ? ?-Fever:  didn't check but felt warm ?-Cough:  yes, dry ?-Shortness of breath: no ?-Wheezing: no ?-Chest pain: no ?-Chest tightness: no ?-Chest congestion: no ?-Nasal congestion: yes ?-Runny nose: yes ?-Post nasal drip: yes ?-Sneezing: no ?-Sore throat:  did but better now ?-Swollen glands: no ?-Sinus pressure:  did but better now ?-Headache: yes ?-Face pain: no ?-Toothache: no ?-Ear pain: no    ?-Ear pressure: yes bilateral ?-Vomiting: no ?-Sick contacts: no ?-Context: better  ? ?Review of Systems  ?Constitutional:  Positive for chills and malaise/fatigue. Negative for fever.  ?HENT:  Positive for congestion. Negative for ear discharge, ear pain, sinus pain and sore throat.   ?Respiratory:  Positive for cough. Negative for sputum production, shortness of breath and wheezing.   ?Cardiovascular:  Negative for chest pain.  ?Gastrointestinal:  Negative for abdominal pain, diarrhea, nausea and vomiting.  ?Musculoskeletal:  Positive for myalgias.  ?Skin: Negative.   ?Neurological:  Positive for headaches.  ? ? ?   ?Objective:  ?  ?BP 116/80   Pulse (!) 104   Temp 97.9 ?F (36.6 ?C)   Resp 18   Ht '5\' 9"'$  (1.753 m)   Wt 175 lb 9.6 oz (79.7 kg)   SpO2 97%   BMI 25.93 kg/m?  ? ? ?Physical Exam ?Constitutional:   ?   Appearance: Normal appearance.  ?HENT:  ?   Head: Normocephalic and atraumatic.  ?   Right Ear: Tympanic membrane, ear canal and external ear normal.  ?   Left Ear: Ear canal and external ear normal. There is impacted cerumen.  ?   Nose: Nose normal.  ?    Mouth/Throat:  ?   Mouth: Mucous membranes are moist.  ?   Comments: Mild PND present ?Eyes:  ?   Conjunctiva/sclera: Conjunctivae normal.  ?Cardiovascular:  ?   Rate and Rhythm: Normal rate and regular rhythm.  ?Pulmonary:  ?   Effort: Pulmonary effort is normal.  ?   Breath sounds: Normal breath sounds. No wheezing, rhonchi or rales.  ?Musculoskeletal:  ?   Cervical back: No tenderness.  ?   Right lower leg: No edema.  ?   Left lower leg: No edema.  ?Lymphadenopathy:  ?   Cervical: No cervical adenopathy.  ?Skin: ?   General: Skin is warm and dry.  ?Neurological:  ?   General: No focal deficit present.  ?   Mental Status: He is alert. Mental status is at baseline.  ?Psychiatric:     ?   Mood and Affect: Mood normal.     ?   Behavior: Behavior normal.  ? ? ?No results found for any visits on 11/11/21. ? ? ?   ?Assessment & Plan:  ? ?1. Acute cough/Congestion of nasal sinus: Suspicious for COVID, tested today. Symptoms ongoing for 5 days, can consider treating with anti-virals if positive but symptoms appear to be resolving. Will quarantine until results come back. Otherwise treat symptomatically with Flonase, cough suppressants and decongestant  that won't affect blood pressure. Discussed obtaining a pulse oximeter to monitor home oxygen levels, emergency protocol reviewed. Follow up if symptoms worsen or fail to improve.  ? ?- Novel Coronavirus, NAA ([facility]) ?- benzonatate (TESSALON) 100 MG capsule; Take 1 capsule (100 mg total) by mouth 2 (two) times daily as needed for cough.  Dispense: 20 capsule; Refill: 0 ?- DM-APAP-CPM (CORICIDIN HBP MAX STRENGTH FLU) 10-325-2 MG TABS; Take 1 each by mouth daily as needed.  Dispense: 840 tablet; Refill: 0 ?- fluticasone (FLONASE) 50 MCG/ACT nasal spray; Place 2 sprays into both nostrils daily.  Dispense: 16 g; Refill: 6 ? ? ?Return if symptoms worsen or fail to improve. ? ?Teodora Medici, DO ? ? ?

## 2021-11-11 NOTE — Patient Instructions (Signed)
It was great seeing you today! ? ?Plan discussed at today's visit: ?-COVID test today, we will let you know the results as soon as we get them but in the meantime please quarantine ?-Nasal spray, cough medication and decongestant sent to pharmacy to use as needed for symptoms ?-Recommend obtaining a pulse oximeter to monitor oxygen levels, if <85-88% and not coming up please go to the ER  ? ?Follow up in: as needed ? ?Take care and let us know if you have any questions or concerns prior to your next visit. ? ?Dr. Rosana Berger ? ?

## 2021-11-13 LAB — NOVEL CORONAVIRUS, NAA

## 2021-11-13 LAB — SPECIMEN STATUS REPORT

## 2021-11-14 ENCOUNTER — Telehealth: Payer: Self-pay

## 2021-11-14 NOTE — Telephone Encounter (Signed)
Copied from [place] [id number]. Topic: General - Other ?>> Nov 14, 2021  9:57 AM McGill, Nelva Bush wrote: ?Reason for CRM: Pt is requesting a call back for resent lab results. ?

## 2021-11-14 NOTE — Telephone Encounter (Signed)
Spoke with patient, he just wanted to clarify he did not need adjustment of cholesterol medication. He was advised it was not recommended by Dr, Ancil Boozer at this time and we would monitor and adjust at next visit if necessary. He verbalized understanding and appreciation for the call back.  ?

## 2021-11-17 ENCOUNTER — Encounter: Payer: Self-pay | Admitting: Family Medicine

## 2021-11-18 ENCOUNTER — Ambulatory Visit
Admission: RE | Admit: 2021-11-18 | Discharge: 2021-11-18 | Disposition: A | Discharge status: Home / Self Care | Payer: Medicare HMO | Source: Ambulatory Visit | Attending: Family Medicine | Admitting: Family Medicine

## 2021-11-18 DIAGNOSIS — Z122 Encounter for screening for malignant neoplasm of respiratory organs: Secondary | ICD-10-CM | POA: Insufficient documentation

## 2021-11-18 DIAGNOSIS — Z87891 Personal history of nicotine dependence: Secondary | ICD-10-CM | POA: Insufficient documentation

## 2021-11-20 ENCOUNTER — Other Ambulatory Visit: Payer: Self-pay | Admitting: Acute Care

## 2021-11-20 DIAGNOSIS — Z87891 Personal history of nicotine dependence: Secondary | ICD-10-CM

## 2021-11-20 DIAGNOSIS — Z122 Encounter for screening for malignant neoplasm of respiratory organs: Secondary | ICD-10-CM

## 2021-12-12 ENCOUNTER — Other Ambulatory Visit: Payer: Self-pay | Admitting: Family Medicine

## 2021-12-12 DIAGNOSIS — R21 Rash and other nonspecific skin eruption: Secondary | ICD-10-CM

## 2021-12-15 DIAGNOSIS — J449 Chronic obstructive pulmonary disease, unspecified: Secondary | ICD-10-CM | POA: Diagnosis not present

## 2021-12-15 DIAGNOSIS — I700 Atherosclerosis of aorta: Secondary | ICD-10-CM | POA: Diagnosis not present

## 2021-12-15 DIAGNOSIS — I10 Essential (primary) hypertension: Secondary | ICD-10-CM | POA: Diagnosis not present

## 2021-12-15 DIAGNOSIS — E785 Hyperlipidemia, unspecified: Secondary | ICD-10-CM | POA: Diagnosis not present

## 2021-12-15 DIAGNOSIS — I2510 Atherosclerotic heart disease of native coronary artery without angina pectoris: Secondary | ICD-10-CM | POA: Diagnosis not present

## 2021-12-29 NOTE — Progress Notes (Unsigned)
Name: Ernest Sweeney   MRN: [id number]    DOB: 1951-05-22   Date:12/30/2021       Progress Note  Subjective  Chief Complaint  Follow Up  HPI  Leucopenia: last WBC was stable at 2.8. He had COVID in May but recovered quickly    Major Depression: seen January 2020 and phq 9 was very high at 42, we started him on Zofot 50 mg but he  stopped Zofot because he read the side effects, he has been off medication for a while now and pqh 9 is negative, he is working two part time jobs, two different cleaning jobs. He states feeling well    Alcohol use disorder: he always drank, used to beer when he was younger, but since his mid 20's he started to drink liquor, since his 78's he has been drinking heavier, mostly on weekends and worse since COVID-19, drinking a pint and a half of liquor on weekends and some beer. He has intermittent headaches since COVID episode this May    HTN: he was getting dizzy on 30 mg of Lisinopril , he is now taking 20 mg and bp is at goal, no chest pain, palpitation , he states no longer having dizziness, however bp is towards low end of normal. Discussed considering cutting down lisinopril to 10 mg during week days when not drinking and full pill when drinking.    Chronic bronchitis/Emphysema : he used Breo in the past but he stopped medication on his own, quit smoking in 2016 he is doing well, no cough or SOB. Denies wheezing   Hyperlipidemia/Atherosclerosis of Aorta: taking aspirin and rosuvastatin. LDL went from 80 to 64 but went up again to over 70 Dec 2022, he is now on Crestor 40 and we will recheck levels. No side effects of medications   History of prostate cancer dx 2016 : last PSA was normal seeing Dr. Baruch Gouty every 12  months now. He denies urinary urgency and nocturia has resolved .Stable   Inguinal hernia: history of right inguinal hernia repair, he states over the past couple of months he noticed a bulging on left inguinal area, no pain or redness.   Low back  pain: he states he has some discomfort on lumbar area when he bends forward it comes in spurts, currently just feels sore. Discussed Tylenol and we will give him some tizanidine to take prn during flares but don't take it when drinking alcohol   Patient Active Problem List   Diagnosis Date Noted   Other neutropenia ([place]) 12/30/2021   Intermittent low back pain 12/30/2021   Essential hypertension 12/30/2021   Left inguinal hernia 12/30/2021   History of right inguinal hernia repair    Alcoholism ([place]) 06/28/2020   Major depression in remission ([place]) 06/28/2020   Lung nodules 09/04/2018   Chronic bronchitis ([place]) 04/23/2017   Atherosclerosis of aorta ([place]) 04/23/2017   Coronary artery disease due to calcified coronary lesion 04/23/2017   Glaucoma, left eye 08/04/2016   History of prostate cancer 07/15/2016   History of prostatectomy 07/15/2016   Vitamin D deficiency 05/31/2015   Dyslipidemia 05/31/2015   Seasonal allergic rhinitis 05/31/2015   Chronic pain of both shoulders 05/31/2015   Family history of malignant neoplasm of prostate 07/26/2012   Hematuria, microscopic 07/26/2012    Past Surgical History:  Procedure Laterality Date   COLONOSCOPY  2010   Dr Sula Rumple   COLONOSCOPY WITH PROPOFOL N/A 01/05/2020   Procedure: COLONOSCOPY WITH PROPOFOL;  Surgeon: [name],  MD;  Location: [facility];  Service: Gastroenterology;  Laterality: N/A;   excision skin cyst  06-19-14   Dr. Festus Aloe CYST   INGUINAL HERNIA REPAIR Right 09/06/2020   Procedure: HERNIA REPAIR RIGH  INGUINAL WITH MESH ADULT, open with RNFA to assist;  Surgeon: Fredirick Maudlin, MD;  Location: [facility] OR[facility];  Service: General;  Laterality: Right;  Provider requesting 2 hours/120 minutes for procedure.   PELVIC LYMPH NODE DISSECTION N/A 07/15/2016   Procedure: PELVIC LYMPH NODE DISSECTION;  Surgeon: Nickie Retort, MD;  Location: [facility] ORS;  Service: Urology;  Laterality: N/A;   ROBOT ASSISTED LAPAROSCOPIC  RADICAL PROSTATECTOMY N/A 07/15/2016   Procedure: ROBOTIC ASSISTED LAPAROSCOPIC RADICAL PROSTATECTOMY;  Surgeon: Nickie Retort, MD;  Location: [facility] OR[facility];  Service: Urology;  Laterality: N/A;    Family History  Problem Relation Age of Onset   Heart disease Mother    Heart attack Mother    Diabetes Father    Heart attack Father    Dementia Father    Cancer Sister        Breast   Cancer Brother        Prostate   Healthy Sister    Stroke Brother     Social History   Tobacco Use   Smoking status: Former    Packs/day: 0.50    Years: 40.00    Total pack years: 20.00    Types: Cigarettes    Quit date: 03/23/2016    Years since quitting: 5.7   Smokeless tobacco: Never  Substance Use Topics   Alcohol use: Yes    Alcohol/week: 2.0 - 4.0 standard drinks of alcohol    Types: 2 - 4 Standard drinks or equivalent per week    Comment: 1-2 times per week     Current Outpatient Medications:    aspirin 81 MG tablet, Take 81 mg by mouth daily., Disp: , Rfl:    brimonidine-timolol (COMBIGAN) 0.2-0.5 % ophthalmic solution, Place 1 drop into both eyes every 12 (twelve) hours., Disp: , Rfl:    Cholecalciferol (VITAMIN D) 2000 units CAPS, Take 1 capsule (2,000 Units total) by mouth daily., Disp: 30 capsule, Rfl: 0   fluticasone (FLONASE) 50 MCG/ACT nasal spray, Place 2 sprays into both nostrils daily., Disp: 16 g, Rfl: 6   latanoprost (XALATAN) 0.005 % ophthalmic solution, Place 1 drop into both eyes at bedtime. , Disp: , Rfl:    sildenafil (VIAGRA) 100 MG tablet, Take 1 tablet (100 mg total) by mouth daily as needed for erectile dysfunction., Disp: 30 tablet, Rfl: 6   tiZANidine (ZANAFLEX) 2 MG tablet, Take 1-2 tablets (2-4 mg total) by mouth every 6 (six) hours as needed for muscle spasms., Disp: 60 tablet, Rfl: 0   triamcinolone cream (KENALOG) 0.1 %, APPLY TOPICALLY AS DIRECTED TWICE DAILY, Disp: 45 g, Rfl: 0   lisinopril (ZESTRIL) 20 MG tablet, Take 1 tablet (20 mg total) by mouth daily.,  Disp: 90 tablet, Rfl: 1   rosuvastatin (CRESTOR) 40 MG tablet, Take 1 tablet (40 mg total) by mouth daily. In place of Atorvastatin, Disp: 90 tablet, Rfl: 1  No Known Allergies  I personally reviewed active problem list, medication list, allergies, family history, social history, health maintenance with the patient/caregiver today.   ROS  Constitutional: Negative for fever or weight change.  Respiratory: Negative for cough and shortness of breath.   Cardiovascular: Negative for chest pain or palpitations.  Gastrointestinal: Negative for abdominal pain, no bowel changes.  Musculoskeletal: Negative for gait problem or  joint swelling.  Skin: Negative for rash.  Neurological: Negative for dizziness or headache.  No other specific complaints in a complete review of systems (except as listed in HPI above).   Objective  Vitals:   12/30/21 0932  BP: 114/82  Pulse: 92  Resp: 16  SpO2: 100%  Weight: 175 lb (79.4 kg)  Height: '5\' 9"'$  (1.753 m)    Body mass index is 25.84 kg/m.  Physical Exam  Constitutional: Patient appears well-developed and well-nourished. No distress.  HEENT: head atraumatic, normocephalic, pupils equal and reactive to light, neck supple Cardiovascular: Normal rate, regular rhythm and normal heart sounds.  No murmur heard. No BLE edema. Pulmonary/Chest: Effort normal and breath sounds normal. No respiratory distress. Abdominal: Soft.  There is no tenderness.left inguinal bulging, reducible hernia no pain or redness  Psychiatric: Patient has a normal mood and affect. behavior is normal. Judgment and thought content normal.   Recent Results (from the past 2160 hour(s))  Novel Coronavirus, NAA ([facility])     Status: None   Collection Time: 11/11/21 12:00 AM   Specimen: Nasopharyngeal(NP) swabs in vial transport medium   Nasopharynge  Previous  Result Value Ref Range   SARS-CoV-2, NAA CANCELED     Comment: Test(s) not performed. Testing of the specimen could not be  completed due to a specimen identification problem. No patient identification on container. CONTACTED [place] 11/12/2021  Result canceled by the ancillary.   Specimen status report     Status: None   Collection Time: 11/11/21 12:00 AM  Result Value Ref Range   specimen status report Comment     Comment: Please note Please note The date and/or time of collection was not indicated on the requisition as required by state and federal law.  The date of receipt of the specimen was used as the collection date if not supplied.     PHQ2/9:    12/30/2021    9:32 AM 11/11/2021   10:22 AM 07/01/2021    7:51 AM 02/27/2021   10:52 AM 12/30/2020    8:24 AM  Depression screen PHQ 2/9  Decreased Interest 0 0 0 0 0  Down, Depressed, Hopeless 0 0 0 0 0  PHQ - 2 Score 0 0 0 0 0  Altered sleeping 0 0 0  0  Tired, decreased energy 0 0 0  0  Change in appetite 0 0 0  1  Feeling bad or failure about yourself  0 0 0  0  Trouble concentrating 0 0 0  0  Moving slowly or fidgety/restless 0 0 0  0  Suicidal thoughts 0 0 0  0  PHQ-9 Score 0 0 0  1  Difficult doing work/chores  Not difficult at all       phq 9 is negative   Fall Risk:    12/30/2021    9:31 AM 11/11/2021   10:21 AM 07/01/2021    7:51 AM 02/27/2021   10:55 AM 12/30/2020    8:24 AM  Fall Risk   Falls in the past year? 0 0 0 0 0  Number falls in past yr: 0 0 0 0 0  Injury with Fall? 0 0 0 0 0  Risk for fall due to : No Fall Risks  No Fall Risks No Fall Risks   Follow up Falls prevention discussed  Falls prevention discussed Falls prevention discussed       Functional Status Survey: Is the patient deaf or have difficulty hearing?:  No Does the patient have difficulty seeing, even when wearing glasses/contacts?: Yes Does the patient have difficulty concentrating, remembering, or making decisions?: No Does the patient have difficulty walking or climbing stairs?: No Does the patient have difficulty dressing  or bathing?: No Does the patient have difficulty doing errands alone such as visiting a doctor's office or shopping?: No    Assessment & Plan  Problem List Items Addressed This Visit     Dyslipidemia    We adjusted dose of Rosuvastatin 06/2022 and we will recheck labs today, no side effects      Relevant Medications   rosuvastatin (CRESTOR) 40 MG tablet   Other Relevant Orders   Lipid panel   COMPLETE METABOLIC PANEL WITH GFR   History of prostate cancer    S/p prostatectomy 07/2016 Had radiation finished 2019  Doing well PSA normal Sees Urologist once a year, last visit was March 23       Chronic bronchitis ([place])    Not on medication Quit smoking in 2016  Asymptomatic, lung CT showed emphysema      Atherosclerosis of aorta ([facility]) - Primary    On statin therapy       Relevant Medications   rosuvastatin (CRESTOR) 40 MG tablet   lisinopril (ZESTRIL) 20 MG tablet   Other Relevant Orders   Lipid panel   COMPLETE METABOLIC PANEL WITH GFR   Alcoholism ([place])    Down to half pint on weekends       Major depression in remission ([place])    Off medications      Other neutropenia ([facility])    Stable       Intermittent low back pain   Relevant Medications   tiZANidine (ZANAFLEX) 2 MG tablet   Essential hypertension    Discussed cutting dose down to 10 mg when not drinking and monitoring bp at home No dizziness at this time       Relevant Medications   rosuvastatin (CRESTOR) 40 MG tablet   lisinopril (ZESTRIL) 20 MG tablet   Left inguinal hernia    Reassurance given, discussed sings and symptoms of incarceration and when to go to [facility] We can also refer him back to surgeon when bothersome or larger in size      Coronary artery disease due to calcified coronary lesion   Relevant Medications   rosuvastatin (CRESTOR) 40 MG tablet   lisinopril (ZESTRIL) 20 MG tablet

## 2021-12-30 ENCOUNTER — Encounter: Payer: Self-pay | Admitting: Family Medicine

## 2021-12-30 ENCOUNTER — Ambulatory Visit (INDEPENDENT_AMBULATORY_CARE_PROVIDER_SITE_OTHER): Payer: Medicare HMO | Admitting: Family Medicine

## 2021-12-30 VITALS — BP 114/82 | HR 92 | Resp 16 | Ht 69.0 in | Wt 175.0 lb

## 2021-12-30 DIAGNOSIS — E785 Hyperlipidemia, unspecified: Secondary | ICD-10-CM | POA: Diagnosis not present

## 2021-12-30 DIAGNOSIS — I10 Essential (primary) hypertension: Secondary | ICD-10-CM | POA: Diagnosis not present

## 2021-12-30 DIAGNOSIS — I700 Atherosclerosis of aorta: Secondary | ICD-10-CM

## 2021-12-30 DIAGNOSIS — I2584 Coronary atherosclerosis due to calcified coronary lesion: Secondary | ICD-10-CM

## 2021-12-30 DIAGNOSIS — I2510 Atherosclerotic heart disease of native coronary artery without angina pectoris: Secondary | ICD-10-CM | POA: Diagnosis not present

## 2021-12-30 DIAGNOSIS — Z8546 Personal history of malignant neoplasm of prostate: Secondary | ICD-10-CM

## 2021-12-30 DIAGNOSIS — F1020 Alcohol dependence, uncomplicated: Secondary | ICD-10-CM

## 2021-12-30 DIAGNOSIS — J410 Simple chronic bronchitis: Secondary | ICD-10-CM

## 2021-12-30 DIAGNOSIS — D708 Other neutropenia: Secondary | ICD-10-CM | POA: Diagnosis not present

## 2021-12-30 DIAGNOSIS — K4090 Unilateral inguinal hernia, without obstruction or gangrene, not specified as recurrent: Secondary | ICD-10-CM | POA: Diagnosis not present

## 2021-12-30 DIAGNOSIS — F325 Major depressive disorder, single episode, in full remission: Secondary | ICD-10-CM | POA: Diagnosis not present

## 2021-12-30 DIAGNOSIS — R69 Illness, unspecified: Secondary | ICD-10-CM | POA: Diagnosis not present

## 2021-12-30 DIAGNOSIS — M545 Low back pain, unspecified: Secondary | ICD-10-CM | POA: Diagnosis not present

## 2021-12-30 MED ORDER — LISINOPRIL 20 MG PO TABS
20.0000 mg | ORAL_TABLET | Freq: Every day | ORAL | 1 refills | Status: DC
Start: 2021-12-30 — End: 2022-06-16

## 2021-12-30 MED ORDER — ROSUVASTATIN CALCIUM 40 MG PO TABS: 40.0000 mg | ORAL_TABLET | Freq: Every day | ORAL | 1 refills | Status: DC

## 2021-12-30 MED ORDER — TIZANIDINE HCL 2 MG PO TABS: 2.0000 mg | ORAL_TABLET | Freq: Four times a day (QID) | ORAL | 0 refills | Status: DC | PRN

## 2021-12-30 NOTE — Assessment & Plan Note (Signed)
Discussed cutting dose down to 10 mg when not drinking and monitoring bp at home No dizziness at this time

## 2021-12-30 NOTE — Assessment & Plan Note (Signed)
On statin therapy 

## 2021-12-30 NOTE — Assessment & Plan Note (Addendum)
S/p prostatectomy 07/2016 Had radiation finished 2019  Doing well PSA normal Sees Urologist once a year, last visit was March 23

## 2021-12-30 NOTE — Assessment & Plan Note (Signed)
Off medications

## 2021-12-30 NOTE — Assessment & Plan Note (Signed)
Stable

## 2021-12-30 NOTE — Assessment & Plan Note (Signed)
Reassurance given, discussed sings and symptoms of incarceration and when to go to [facility] We can also refer him back to surgeon when bothersome or larger in size

## 2021-12-30 NOTE — Assessment & Plan Note (Addendum)
Not on medication Quit smoking in 2016  Asymptomatic, lung CT showed emphysema

## 2021-12-30 NOTE — Assessment & Plan Note (Signed)
We adjusted dose of Rosuvastatin 06/2022 and we will recheck labs today, no side effects

## 2021-12-30 NOTE — Assessment & Plan Note (Addendum)
Down to half pint on weekends

## 2021-12-31 LAB — LIPID PANEL
Cholesterol: 163 mg/dL (ref <200)
HDL: 74 mg/dL (ref >=40)
LDL Cholesterol (Calc): 73 mg/dL (calc)
Non-HDL Cholesterol (Calc): 89 mg/dL (calc) (ref <130)
Total CHOL/HDL Ratio: 2.2 (calc) (ref <5.0)
Triglycerides: 81 mg/dL (ref <150)

## 2021-12-31 LAB — COMPLETE METABOLIC PANEL WITH GFR
AG Ratio: 1.8 (calc) (ref 1.0–2.5)
ALT: 10 U/L (ref 9–46)
AST: 14 U/L (ref 10–35)
Albumin: 4.6 g/dL (ref 3.6–5.1)
Alkaline phosphatase (APISO): 43 U/L (ref 35–144)
BUN/Creatinine Ratio: 14 (calc) (ref 6–22)
BUN: 18 mg/dL (ref 7–25)
CO2: 26 mmol/L (ref 20–32)
Calcium: 9.6 mg/dL (ref 8.6–10.3)
Chloride: 104 mmol/L (ref 98–110)
Creat: 1.32 mg/dL — ABNORMAL HIGH (ref 0.70–1.28)
Globulin: 2.5 g/dL (calc) (ref 1.9–3.7)
Glucose, Bld: 92 mg/dL (ref 65–99)
Potassium: 4.7 mmol/L (ref 3.5–5.3)
Sodium: 139 mmol/L (ref 135–146)
Total Bilirubin: 1.1 mg/dL (ref 0.2–1.2)
Total Protein: 7.1 g/dL (ref 6.1–8.1)
eGFR: 58 mL/min/{1.73_m2} — ABNORMAL LOW (ref >=60)

## 2022-02-11 ENCOUNTER — Other Ambulatory Visit: Payer: Self-pay | Admitting: Ophthalmology

## 2022-02-11 ENCOUNTER — Other Ambulatory Visit (HOSPITAL_COMMUNITY): Payer: Self-pay | Admitting: Ophthalmology

## 2022-02-11 DIAGNOSIS — H5347 Heteronymous bilateral field defects: Secondary | ICD-10-CM

## 2022-02-11 DIAGNOSIS — H401132 Primary open-angle glaucoma, bilateral, moderate stage: Secondary | ICD-10-CM | POA: Diagnosis not present

## 2022-02-20 ENCOUNTER — Ambulatory Visit
Admission: RE | Admit: 2022-02-20 | Discharge: 2022-02-20 | Disposition: A | Discharge status: Home / Self Care | Payer: Medicare HMO | Source: Ambulatory Visit | Attending: Ophthalmology | Admitting: Ophthalmology

## 2022-02-20 DIAGNOSIS — H5347 Heteronymous bilateral field defects: Secondary | ICD-10-CM | POA: Diagnosis not present

## 2022-02-20 DIAGNOSIS — R220 Localized swelling, mass and lump, head: Secondary | ICD-10-CM | POA: Diagnosis not present

## 2022-02-20 DIAGNOSIS — D444 Neoplasm of uncertain behavior of craniopharyngeal duct: Secondary | ICD-10-CM | POA: Diagnosis not present

## 2022-02-20 MED ORDER — GADOBUTROL 1 MMOL/ML IV SOLN
7.5000 mL | Freq: Once | INTRAVENOUS | Status: AC | PRN
  Administered 2022-02-20: 7.5 mL via INTRAVENOUS

## 2022-03-03 ENCOUNTER — Ambulatory Visit (INDEPENDENT_AMBULATORY_CARE_PROVIDER_SITE_OTHER): Payer: Medicare HMO

## 2022-03-03 VITALS — BP 114/70 | HR 86 | Temp 97.8°F | Resp 14 | Ht 69.0 in | Wt 176.8 lb

## 2022-03-03 DIAGNOSIS — Z0000 Encounter for general adult medical examination without abnormal findings: Secondary | ICD-10-CM | POA: Diagnosis not present

## 2022-03-03 NOTE — Progress Notes (Signed)
Subjective:   Ernest Sweeney is a 71 y.o. male who presents for Medicare Annual/Subsequent preventive examination.  Review of Systems    Defer to PCP       Objective:    There were no vitals filed for this visit. There is no height or weight on file to calculate BMI.     04/14/2021   10:02 AM 02/27/2021   10:55 AM 09/06/2020    6:25 AM 08/30/2020    8:56 AM 02/27/2020   10:42 AM 01/05/2020    9:37 AM 02/21/2019   11:10 AM  Advanced Directives  Does Patient Have a Medical Advance Directive? No No No No No No No  Would patient like information on creating a medical advance directive? No - Patient declined No - Patient declined No - Patient declined Yes (MAU/Ambulatory/Procedural Areas - Information given) No - Patient declined No - Patient declined Yes (MAU/Ambulatory/Procedural Areas - Information given)    Current Medications (verified) Outpatient Encounter Medications as of 03/03/2022  Medication Sig   aspirin 81 MG tablet Take 81 mg by mouth daily.   brimonidine-timolol (COMBIGAN) 0.2-0.5 % ophthalmic solution Place 1 drop into both eyes every 12 (twelve) hours.   Cholecalciferol (VITAMIN D) 2000 units CAPS Take 1 capsule (2,000 Units total) by mouth daily.   fluticasone (FLONASE) 50 MCG/ACT nasal spray Place 2 sprays into both nostrils daily.   latanoprost (XALATAN) 0.005 % ophthalmic solution Place 1 drop into both eyes at bedtime.    lisinopril (ZESTRIL) 20 MG tablet Take 1 tablet (20 mg total) by mouth daily.   rosuvastatin (CRESTOR) 40 MG tablet Take 1 tablet (40 mg total) by mouth daily. In place of Atorvastatin   sildenafil (VIAGRA) 100 MG tablet Take 1 tablet (100 mg total) by mouth daily as needed for erectile dysfunction.   tiZANidine (ZANAFLEX) 2 MG tablet Take 1-2 tablets (2-4 mg total) by mouth every 6 (six) hours as needed for muscle spasms.   triamcinolone cream (KENALOG) 0.1 % APPLY TOPICALLY AS DIRECTED TWICE DAILY   No facility-administered encounter  medications on file as of 03/03/2022.    Allergies (verified) Patient has no known allergies.   History: Past Medical History:  Diagnosis Date   Anxiety    Panic attacks in the past   Arthritis    Benign prostatic hypertrophy without lower urinary tract symptoms    Cancer (HCC)    Prostate. monitoring now since surgery   Colon polyp    GERD (gastroesophageal reflux disease)    Glaucoma (increased eye pressure)    Left eye only   Hyperlipidemia    Hypertension    Past Surgical History:  Procedure Laterality Date   COLONOSCOPY  2010   Dr Sula Rumple   COLONOSCOPY WITH PROPOFOL N/A 01/05/2020   Procedure: COLONOSCOPY WITH PROPOFOL;  Surgeon: [name], MD;  Location: [facility];  Service: Gastroenterology;  Laterality: N/A;   excision skin cyst  06-19-14   Dr. Festus Aloe CYST   INGUINAL HERNIA REPAIR Right 09/06/2020   Procedure: HERNIA REPAIR RIGH  INGUINAL WITH MESH ADULT, open with RNFA to assist;  Surgeon: Fredirick Maudlin, MD;  Location: [facility] ORS;  Service: General;  Laterality: Right;  Provider requesting 2 hours/120 minutes for procedure.   PELVIC LYMPH NODE DISSECTION N/A 07/15/2016   Procedure: PELVIC LYMPH NODE DISSECTION;  Surgeon: Nickie Retort, MD;  Location: [facility] ORS;  Service: Urology;  Laterality: N/A;   ROBOT ASSISTED LAPAROSCOPIC RADICAL PROSTATECTOMY N/A 07/15/2016   Procedure: ROBOTIC ASSISTED LAPAROSCOPIC  RADICAL PROSTATECTOMY;  Surgeon: Nickie Retort, MD;  Location: [facility] ORS;  Service: Urology;  Laterality: N/A;   Family History  Problem Relation Age of Onset   Heart disease Mother    Heart attack Mother    Diabetes Father    Heart attack Father    Dementia Father    Cancer Sister        Breast   Cancer Brother        Prostate   Healthy Sister    Stroke Brother    Social History   Socioeconomic History   Marital status: Widowed    Spouse name: Not on file   Number of children: 0   Years of education: Not on file   Highest  education level: 12th grade  Occupational History   Occupation: custodian     Comment: part time   Tobacco Use   Smoking status: Former    Packs/day: 0.50    Years: 40.00    Total pack years: 20.00    Types: Cigarettes    Quit date: 03/23/2016    Years since quitting: 5.9   Smokeless tobacco: Never  Vaping Use   Vaping Use: Never used  Substance and Sexual Activity   Alcohol use: Yes    Alcohol/week: 2.0 - 4.0 standard drinks of alcohol    Types: 2 - 4 Standard drinks or equivalent per week    Comment: 1-2 times per week   Drug use: No   Sexual activity: Yes    Birth control/protection: Condom  Other Topics Concern   Not on file  Social History Narrative   Patient lives alone.    He will be staying with his sister after surgery.   Social Determinants of Health   Financial Resource Strain: Medium Risk (02/27/2021)   Overall Financial Resource Strain (CARDIA)    Difficulty of Paying Living Expenses: Somewhat hard  Food Insecurity: Food Insecurity Present (02/27/2021)   Hunger Vital Sign    Worried About Running Out of Food in the Last Year: Often true    Ran Out of Food in the Last Year: Never true  Transportation Needs: No Transportation Needs (02/27/2021)   PRAPARE - Hydrologist (Medical): No    Lack of Transportation (Non-Medical): No  Physical Activity: Sufficiently Active (02/27/2021)   Exercise Vital Sign    Days of Exercise per Week: 5 days    Minutes of Exercise per Session: 30 min  Stress: No Stress Concern Present (02/27/2021)   [place]    Feeling of Stress : Not at all  Social Connections: Socially Isolated (02/27/2021)   Social Connection and Isolation Panel [NHANES]    Frequency of Communication with Friends and Family: More than three times a week    Frequency of Social Gatherings with Friends and Family: Once a week    Attends Religious Services: Never     Marine scientist or Organizations: No    Attends Archivist Meetings: Never    Marital Status: Widowed    Tobacco Counseling Counseling given: Not Answered   Clinical Intake:                 Diabetic?N/A         Activities of Daily Living    12/30/2021    9:32 AM 11/11/2021   10:22 AM  In your present state of health, do you have any difficulty performing the following activities:  Hearing? 0 0  Vision? 1 0  Difficulty concentrating or making decisions? 0 0  Walking or climbing stairs? 0 0  Dressing or bathing? 0 0  Doing errands, shopping? 0 0    Patient Care Team: [name], MD as PCP - General (Family Medicine) Abbie Sons, MD as Consulting Physician (Urology) Billey Co, MD as Consulting Physician (Urology) Noreene Filbert, MD as Referring Physician (Radiation Oncology) Yolonda Kida, MD as Consulting Physician (Cardiology)  Indicate any recent Medical Services you may have received from other than [facility] providers in the past year (date may be approximate).     Assessment:   This is a routine wellness examination for Ernest Sweeney.  Hearing/Vision screen No results found.  Dietary issues and exercise activities discussed:     Goals Addressed   None   Depression Screen    12/30/2021    9:32 AM 11/11/2021   10:22 AM 07/01/2021    7:51 AM 02/27/2021   10:52 AM 12/30/2020    8:24 AM 11/14/2020    1:19 PM 08/08/2020   11:00 AM  PHQ 2/9 Scores  PHQ - 2 Score 0 0 0 0 0 0 0  PHQ- 9 Score 0 0 0  1  0    Fall Risk    12/30/2021    9:31 AM 11/11/2021   10:21 AM 07/01/2021    7:51 AM 02/27/2021   10:55 AM 12/30/2020    8:24 AM  Fall Risk   Falls in the past year? 0 0 0 0 0  Number falls in past yr: 0 0 0 0 0  Injury with Fall? 0 0 0 0 0  Risk for fall due to : No Fall Risks  No Fall Risks No Fall Risks   Follow up Falls prevention discussed  Falls prevention discussed Falls prevention discussed     FALL RISK  PREVENTION PERTAINING TO THE HOME:  Any stairs in or around the home? Yes  If so, are there any without handrails? No  Home free of loose throw rugs in walkways, pet beds, electrical cords, etc? Yes  Adequate lighting in your home to reduce risk of falls? Yes   ASSISTIVE DEVICES UTILIZED TO PREVENT FALLS:  Life alert? No  Use of a cane, walker or w/c? No  Grab bars in the bathroom? No  Shower chair or bench in shower? No  Elevated toilet seat or a handicapped toilet? No   TIMED UP AND GO:  Was the test performed? Yes .  Length of time to ambulate 10 feet: 3 sec.   Gait steady and fast without use of assistive device  Cognitive Function:        02/27/2020   10:44 AM 02/21/2019   11:13 AM 02/11/2018   10:33 AM 02/05/2017   11:22 AM  6CIT Screen  What Year? 0 points 0 points 0 points 0 points  What month? 0 points 0 points 0 points 0 points  What time? 0 points 0 points 0 points 0 points  Count back from 20 0 points 0 points 0 points 0 points  Months in reverse 0 points 0 points 0 points 0 points  Repeat phrase 2 points 2 points 0 points 8 points  Total Score 2 points 2 points 0 points 8 points    Immunizations Immunization History  Administered Date(s) Administered   Fluad Quad(high Dose 65+) 05/02/2019, 05/02/2020   Influenza, High Dose Seasonal PF 05/21/2016, 05/07/2017, 04/27/2018   Influenza-Unspecified 04/12/2014, 04/27/2015, 05/09/2021  Moderna SARS-COV2 Booster Vaccination 10/24/2020   Moderna Sars-Covid-2 Vaccination 08/23/2019, 09/20/2019, 05/08/2020, 05/09/2021   Pneumococcal Conjugate-13 02/05/2017   Pneumococcal Polysaccharide-23 03/08/2012, 02/11/2018   Tdap 04/03/2010   Zoster, Live 02/05/2016    TDAP status: Due, Education has been provided regarding the importance of this vaccine. Advised may receive this vaccine at local pharmacy or Health Dept. Aware to provide a copy of the vaccination record if obtained from local pharmacy or Health Dept.  Verbalized acceptance and understanding.  Flu Vaccine status: Due, Education has been provided regarding the importance of this vaccine. Advised may receive this vaccine at local pharmacy or Health Dept. Aware to provide a copy of the vaccination record if obtained from local pharmacy or Health Dept. Verbalized acceptance and understanding.  Pneumococcal vaccine status: Up to date  Covid-19 vaccine status: Completed vaccines  Qualifies for Shingles Vaccine? Yes   Zostavax completed No   Shingrix Completed?: No.    Education has been provided regarding the importance of this vaccine. Patient has been advised to call insurance company to determine out of pocket expense if they have not yet received this vaccine. Advised may also receive vaccine at local pharmacy or Health Dept. Verbalized acceptance and understanding.  Screening Tests Health Maintenance  Topic Date Due   Zoster Vaccines- Shingrix (1 of 2) Never done   TETANUS/TDAP  04/03/2020   COVID-19 Vaccine (5 - Moderna risk series) 07/04/2021   INFLUENZA VACCINE  02/10/2022   COLONOSCOPY (Pts 45-72yr Insurance coverage will need to be confirmed)  01/04/2025   Pneumonia Vaccine 71 Years old  Completed   Hepatitis C Screening  Completed   HPV VACCINES  Aged Out    Health Maintenance  Health Maintenance Due  Topic Date Due   Zoster Vaccines- Shingrix (1 of 2) Never done   TETANUS/TDAP  04/03/2020   COVID-19 Vaccine (5 - Moderna risk series) 07/04/2021   INFLUENZA VACCINE  02/10/2022    Colorectal cancer screening: Type of screening: Colonoscopy. Completed 01/05/2020. Repeat every 5 years  Lung Cancer Screening: (Low Dose CT Chest recommended if Age 71-80years, 30 pack-year currently smoking OR have quit w/in 15years.) does not qualify.   Lung Cancer Screening Referral: N/A  Additional Screening:  Hepatitis C Screening: does not qualify; Completed 03/15/2012  Vision Screening: Recommended annual ophthalmology exams for  early detection of glaucoma and other disorders of the eye. Is the patient up to date with their annual eye exam?  Yes  Who is the provider or what is the name of the office in which the patient attends annual eye exams? [facility]/[place] recent tumor diagnosis behind both eyes/awaiting neurology evaluation for treatment plan.  If pt is not established with a provider, would they like to be referred to a provider to establish care? No .   Dental Screening: Recommended annual dental exams for proper oral hygiene  Community Resource Referral / Chronic Care Management: CRR required this visit?  Yes   CCM required this visit?  Yes      Plan:     I have personally reviewed and noted the following in the patient's chart:   Medical and social history Use of alcohol, tobacco or illicit drugs  Current medications and supplements including opioid prescriptions. Patient is not currently taking opioid prescriptions. Functional ability and status Nutritional status Physical activity Advanced directives List of other physicians Hospitalizations, surgeries, and ER visits in previous 12 months Vitals Screenings to include cognitive, depression, and falls Referrals and appointments  In  addition, I have reviewed and discussed with patient certain preventive protocols, quality metrics, and best practice recommendations. A written personalized care plan for preventive services as well as general preventive health recommendations were provided to patient.     Royal Hawthorn, [place]   03/03/2022   Nurse Notes: Face to face 42 minutes spent  Ernest Sweeney , Thank you for taking time to come for your Medicare Wellness Visit. I appreciate your ongoing commitment to your health goals. Please review the following plan we discussed and let me know if I can assist you in the future.   These are the goals we discussed:  Goals      DIET - INCREASE WATER INTAKE     Recommend to drink at least 6-8  8oz glasses of water per day.        This is a list of the screening recommended for you and due dates:  Health Maintenance  Topic Date Due   Zoster (Shingles) Vaccine (1 of 2) Never done   Tetanus Vaccine  04/03/2020   COVID-19 Vaccine (5 - Moderna risk series) 07/04/2021   Flu Shot  02/10/2022   Colon Cancer Screening  01/04/2025   Pneumonia Vaccine  Completed   Hepatitis C Screening: USPSTF Recommendation to screen - Ages 18-79 yo.  Completed   HPV Vaccine  Aged Out

## 2022-03-05 ENCOUNTER — Telehealth: Payer: Self-pay

## 2022-03-05 NOTE — Telephone Encounter (Signed)
   Telephone encounter was:  Successful.  03/05/2022 Name: ANIKETH HUBERTY MRN: [id number] DOB: Jun 01, 1951  MEKEL HAVERSTOCK is a 71 y.o. year old male who is a primary care patient of [name], MD . The community resource team was consulted for assistance with [place] guide performed the following interventions: Spoke with patient verified home address, mailed [employer] the [employer]. Gave patient my name and number to call if he does not receive letter in the next 7-10 business days.  Follow Up Plan:  No further follow up planned at this time. The patient has been provided with needed resources.  Mahealani Sulak, AAS Paralegal, [facility]  [address], [place] [address] ??[email or web address]  ?? [phone]   [email or web address]

## 2022-03-06 ENCOUNTER — Ambulatory Visit: Payer: Self-pay

## 2022-03-06 NOTE — Chronic Care Management (AMB) (Unsigned)
   03/06/2022  Ernest Sweeney May 26, 1951 [id number]  Documentation encounter created to complete case transition. Per chart review, concerns related to community/nutritional resources were addressed on 03/05/22. The care management team will continue to follow for care coordination as needed.   [place] [facility] [phone]

## 2022-04-06 ENCOUNTER — Inpatient Hospital Stay: Payer: Medicare HMO | Attending: Radiation Oncology

## 2022-04-06 DIAGNOSIS — C61 Malignant neoplasm of prostate: Secondary | ICD-10-CM | POA: Diagnosis not present

## 2022-04-06 LAB — PSA: Prostatic Specific Antigen: <0.01 ng/mL (ref 0.00–4.00)

## 2022-04-13 ENCOUNTER — Ambulatory Visit
Admission: RE | Admit: 2022-04-13 | Discharge: 2022-04-13 | Disposition: A | Discharge status: Home / Self Care | Payer: Medicare HMO | Source: Ambulatory Visit | Attending: Radiation Oncology | Admitting: Radiation Oncology

## 2022-04-13 VITALS — BP 126/87 | HR 47 | Temp 96.6°F | Resp 16 | Ht 69.0 in | Wt 178.5 lb

## 2022-04-13 DIAGNOSIS — G939 Disorder of brain, unspecified: Secondary | ICD-10-CM | POA: Diagnosis not present

## 2022-04-13 DIAGNOSIS — Z923 Personal history of irradiation: Secondary | ICD-10-CM | POA: Insufficient documentation

## 2022-04-13 DIAGNOSIS — C61 Malignant neoplasm of prostate: Secondary | ICD-10-CM | POA: Insufficient documentation

## 2022-04-13 DIAGNOSIS — Z191 Hormone sensitive malignancy status: Secondary | ICD-10-CM | POA: Diagnosis not present

## 2022-04-13 NOTE — Progress Notes (Signed)
Radiation Oncology Follow up Note  Name: Ernest Sweeney   Date:   04/13/2022 MRN:  [id number] DOB: 10-22-1950    This 71 y.o. male presents to the clinic today for 4-year follow-up status post salvage radiation therapy to his prostatic fossa for stage IIIb adenocarcinoma the prostate status post robotic assisted prostatectomy.  REFERRING PROVIDER: [name], MD  HPI: Patient is a 71 year old male now out for years having completed salvage radiation therapy to his prostatic fossa for stage IIIb adenocarcinoma of the prostate Gleason 7 status post robotic assisted prostatectomy.  Seen today in routine follow-up he is doing well specifically denies any increased lower urinary tract symptoms diarrhea or fatigue.  His most recent PSA is less than 0.01.  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  He also had a lung CT screening showing no evidence of disease.  COMPLICATIONS OF TREATMENT: none  FOLLOW UP COMPLIANCE: keeps appointments   PHYSICAL EXAM:  BP 126/87   Pulse (!) 47   Temp (!) 96.6 F (35.9 C)   Resp 16   Ht '5\' 9"'$  (1.753 m)   Wt 178 lb 8 oz (81 kg)   BMI 26.36 kg/m  Well-developed well-nourished patient in NAD. HEENT reveals PERLA, EOMI, discs not visualized.  Oral cavity is clear. No oral mucosal lesions are identified. Neck is clear without evidence of cervical or supraclavicular adenopathy. Lungs are clear to A&P. Cardiac examination is essentially unremarkable with regular rate and rhythm without murmur rub or thrill. Abdomen is benign with no organomegaly or masses noted. Motor sensory and DTR levels are equal and symmetric in the upper and lower extremities. Cranial nerves II through XII are grossly intact. Proprioception is intact. No peripheral adenopathy or edema is identified. No motor or sensory levels are noted. Crude visual fields are within normal range.  RADIOLOGY RESULTS: MRI  of brain as well as CT scan of chest reviewed compatible with above-stated findings  PLAN: Present time patient is now out for years with excellent biochemical control of his prostate cancer.  And pleased with his overall progress.  Of asked to see him back in 1 year and then will discontinue follow-up care.  Patient knows to call with any concerns.  I would like to take this opportunity to thank you for allowing me to participate in the care of your patient.Noreene Filbert, MD

## 2022-04-17 DIAGNOSIS — R9089 Other abnormal findings on diagnostic imaging of central nervous system: Secondary | ICD-10-CM | POA: Diagnosis not present

## 2022-04-17 DIAGNOSIS — G9389 Other specified disorders of brain: Secondary | ICD-10-CM | POA: Diagnosis not present

## 2022-04-17 DIAGNOSIS — H538 Other visual disturbances: Secondary | ICD-10-CM | POA: Diagnosis not present

## 2022-04-17 DIAGNOSIS — H539 Unspecified visual disturbance: Secondary | ICD-10-CM | POA: Diagnosis not present

## 2022-05-07 DIAGNOSIS — R9089 Other abnormal findings on diagnostic imaging of central nervous system: Secondary | ICD-10-CM | POA: Diagnosis not present

## 2022-05-07 DIAGNOSIS — D444 Neoplasm of uncertain behavior of craniopharyngeal duct: Secondary | ICD-10-CM | POA: Diagnosis not present

## 2022-05-14 DIAGNOSIS — R9089 Other abnormal findings on diagnostic imaging of central nervous system: Secondary | ICD-10-CM | POA: Diagnosis not present

## 2022-05-14 DIAGNOSIS — E237 Disorder of pituitary gland, unspecified: Secondary | ICD-10-CM | POA: Diagnosis not present

## 2022-05-14 DIAGNOSIS — D444 Neoplasm of uncertain behavior of craniopharyngeal duct: Secondary | ICD-10-CM | POA: Diagnosis not present

## 2022-05-20 DIAGNOSIS — J449 Chronic obstructive pulmonary disease, unspecified: Secondary | ICD-10-CM | POA: Diagnosis not present

## 2022-05-20 DIAGNOSIS — Z01818 Encounter for other preprocedural examination: Secondary | ICD-10-CM | POA: Diagnosis not present

## 2022-05-20 DIAGNOSIS — C61 Malignant neoplasm of prostate: Secondary | ICD-10-CM | POA: Diagnosis not present

## 2022-05-20 DIAGNOSIS — G9389 Other specified disorders of brain: Secondary | ICD-10-CM | POA: Diagnosis not present

## 2022-05-20 DIAGNOSIS — I10 Essential (primary) hypertension: Secondary | ICD-10-CM | POA: Diagnosis not present

## 2022-05-20 DIAGNOSIS — J310 Chronic rhinitis: Secondary | ICD-10-CM | POA: Diagnosis not present

## 2022-05-20 DIAGNOSIS — J343 Hypertrophy of nasal turbinates: Secondary | ICD-10-CM | POA: Diagnosis not present

## 2022-05-20 DIAGNOSIS — D444 Neoplasm of uncertain behavior of craniopharyngeal duct: Secondary | ICD-10-CM | POA: Diagnosis not present

## 2022-05-26 DIAGNOSIS — H2513 Age-related nuclear cataract, bilateral: Secondary | ICD-10-CM | POA: Diagnosis not present

## 2022-05-26 DIAGNOSIS — H16143 Punctate keratitis, bilateral: Secondary | ICD-10-CM | POA: Diagnosis not present

## 2022-05-26 DIAGNOSIS — H5347 Heteronymous bilateral field defects: Secondary | ICD-10-CM | POA: Diagnosis not present

## 2022-05-26 DIAGNOSIS — H4010X0 Unspecified open-angle glaucoma, stage unspecified: Secondary | ICD-10-CM | POA: Diagnosis not present

## 2022-05-26 DIAGNOSIS — G9389 Other specified disorders of brain: Secondary | ICD-10-CM | POA: Diagnosis not present

## 2022-05-26 DIAGNOSIS — H18413 Arcus senilis, bilateral: Secondary | ICD-10-CM | POA: Diagnosis not present

## 2022-05-26 DIAGNOSIS — E236 Other disorders of pituitary gland: Secondary | ICD-10-CM | POA: Diagnosis not present

## 2022-05-28 DIAGNOSIS — I2510 Atherosclerotic heart disease of native coronary artery without angina pectoris: Secondary | ICD-10-CM | POA: Diagnosis not present

## 2022-05-28 DIAGNOSIS — Z781 Physical restraint status: Secondary | ICD-10-CM | POA: Diagnosis not present

## 2022-05-28 DIAGNOSIS — E3601 Intraoperative hemorrhage and hematoma of an endocrine system organ or structure complicating an endocrine system procedure: Secondary | ICD-10-CM | POA: Diagnosis not present

## 2022-05-28 DIAGNOSIS — E669 Obesity, unspecified: Secondary | ICD-10-CM | POA: Diagnosis not present

## 2022-05-28 DIAGNOSIS — I10 Essential (primary) hypertension: Secondary | ICD-10-CM | POA: Diagnosis not present

## 2022-05-28 DIAGNOSIS — J449 Chronic obstructive pulmonary disease, unspecified: Secondary | ICD-10-CM | POA: Diagnosis not present

## 2022-05-28 DIAGNOSIS — G936 Cerebral edema: Secondary | ICD-10-CM | POA: Diagnosis not present

## 2022-05-28 DIAGNOSIS — F32A Depression, unspecified: Secondary | ICD-10-CM | POA: Diagnosis not present

## 2022-05-28 DIAGNOSIS — E871 Hypo-osmolality and hyponatremia: Secondary | ICD-10-CM | POA: Diagnosis not present

## 2022-05-28 DIAGNOSIS — Z8546 Personal history of malignant neoplasm of prostate: Secondary | ICD-10-CM | POA: Diagnosis not present

## 2022-05-28 DIAGNOSIS — D649 Anemia, unspecified: Secondary | ICD-10-CM | POA: Diagnosis not present

## 2022-05-28 DIAGNOSIS — N529 Male erectile dysfunction, unspecified: Secondary | ICD-10-CM | POA: Diagnosis not present

## 2022-05-28 DIAGNOSIS — E232 Diabetes insipidus: Secondary | ICD-10-CM | POA: Diagnosis not present

## 2022-05-28 DIAGNOSIS — D444 Neoplasm of uncertain behavior of craniopharyngeal duct: Secondary | ICD-10-CM | POA: Diagnosis not present

## 2022-05-28 DIAGNOSIS — R69 Illness, unspecified: Secondary | ICD-10-CM | POA: Diagnosis not present

## 2022-05-28 DIAGNOSIS — D497 Neoplasm of unspecified behavior of endocrine glands and other parts of nervous system: Secondary | ICD-10-CM | POA: Diagnosis not present

## 2022-05-28 DIAGNOSIS — K279 Peptic ulcer, site unspecified, unspecified as acute or chronic, without hemorrhage or perforation: Secondary | ICD-10-CM | POA: Diagnosis not present

## 2022-05-28 DIAGNOSIS — Z87891 Personal history of nicotine dependence: Secondary | ICD-10-CM | POA: Diagnosis not present

## 2022-05-28 DIAGNOSIS — H4010X0 Unspecified open-angle glaucoma, stage unspecified: Secondary | ICD-10-CM | POA: Diagnosis not present

## 2022-05-28 DIAGNOSIS — Y838 Other surgical procedures as the cause of abnormal reaction of the patient, or of later complication, without mention of misadventure at the time of the procedure: Secondary | ICD-10-CM | POA: Diagnosis not present

## 2022-05-28 DIAGNOSIS — R040 Epistaxis: Secondary | ICD-10-CM | POA: Diagnosis not present

## 2022-05-28 DIAGNOSIS — D72829 Elevated white blood cell count, unspecified: Secondary | ICD-10-CM | POA: Diagnosis not present

## 2022-05-28 DIAGNOSIS — Z79899 Other long term (current) drug therapy: Secondary | ICD-10-CM | POA: Diagnosis not present

## 2022-05-28 DIAGNOSIS — F10139 Alcohol abuse with withdrawal, unspecified: Secondary | ICD-10-CM | POA: Diagnosis not present

## 2022-05-28 DIAGNOSIS — Z7982 Long term (current) use of aspirin: Secondary | ICD-10-CM | POA: Diagnosis not present

## 2022-05-28 DIAGNOSIS — E785 Hyperlipidemia, unspecified: Secondary | ICD-10-CM | POA: Diagnosis not present

## 2022-05-28 DIAGNOSIS — E222 Syndrome of inappropriate secretion of antidiuretic hormone: Secondary | ICD-10-CM | POA: Diagnosis not present

## 2022-05-28 DIAGNOSIS — G9600 Cerebrospinal fluid leak, unspecified: Secondary | ICD-10-CM | POA: Diagnosis not present

## 2022-05-28 DIAGNOSIS — I700 Atherosclerosis of aorta: Secondary | ICD-10-CM | POA: Diagnosis not present

## 2022-05-28 DIAGNOSIS — C61 Malignant neoplasm of prostate: Secondary | ICD-10-CM | POA: Diagnosis not present

## 2022-05-28 DIAGNOSIS — G8918 Other acute postprocedural pain: Secondary | ICD-10-CM | POA: Diagnosis not present

## 2022-05-28 DIAGNOSIS — Z9889 Other specified postprocedural states: Secondary | ICD-10-CM | POA: Diagnosis not present

## 2022-05-28 DIAGNOSIS — G9389 Other specified disorders of brain: Secondary | ICD-10-CM | POA: Diagnosis not present

## 2022-05-28 HISTORY — PX: OTHER SURGICAL HISTORY: SHX169

## 2022-05-29 DIAGNOSIS — I10 Essential (primary) hypertension: Secondary | ICD-10-CM | POA: Diagnosis not present

## 2022-05-29 DIAGNOSIS — J449 Chronic obstructive pulmonary disease, unspecified: Secondary | ICD-10-CM | POA: Diagnosis not present

## 2022-05-29 DIAGNOSIS — I2510 Atherosclerotic heart disease of native coronary artery without angina pectoris: Secondary | ICD-10-CM | POA: Diagnosis not present

## 2022-05-29 DIAGNOSIS — D444 Neoplasm of uncertain behavior of craniopharyngeal duct: Secondary | ICD-10-CM | POA: Diagnosis not present

## 2022-05-29 DIAGNOSIS — I700 Atherosclerosis of aorta: Secondary | ICD-10-CM | POA: Diagnosis not present

## 2022-05-29 DIAGNOSIS — D497 Neoplasm of unspecified behavior of endocrine glands and other parts of nervous system: Secondary | ICD-10-CM | POA: Diagnosis not present

## 2022-05-29 DIAGNOSIS — G8918 Other acute postprocedural pain: Secondary | ICD-10-CM | POA: Diagnosis not present

## 2022-05-29 DIAGNOSIS — D72829 Elevated white blood cell count, unspecified: Secondary | ICD-10-CM | POA: Diagnosis not present

## 2022-05-29 DIAGNOSIS — K279 Peptic ulcer, site unspecified, unspecified as acute or chronic, without hemorrhage or perforation: Secondary | ICD-10-CM | POA: Diagnosis not present

## 2022-05-29 DIAGNOSIS — G9600 Cerebrospinal fluid leak, unspecified: Secondary | ICD-10-CM | POA: Diagnosis not present

## 2022-05-29 DIAGNOSIS — E232 Diabetes insipidus: Secondary | ICD-10-CM | POA: Diagnosis not present

## 2022-05-29 DIAGNOSIS — G936 Cerebral edema: Secondary | ICD-10-CM | POA: Diagnosis not present

## 2022-05-29 DIAGNOSIS — R040 Epistaxis: Secondary | ICD-10-CM | POA: Diagnosis not present

## 2022-05-29 DIAGNOSIS — F32A Depression, unspecified: Secondary | ICD-10-CM | POA: Diagnosis not present

## 2022-05-29 DIAGNOSIS — H4010X0 Unspecified open-angle glaucoma, stage unspecified: Secondary | ICD-10-CM | POA: Diagnosis not present

## 2022-05-30 DIAGNOSIS — E232 Diabetes insipidus: Secondary | ICD-10-CM | POA: Diagnosis not present

## 2022-05-30 DIAGNOSIS — D444 Neoplasm of uncertain behavior of craniopharyngeal duct: Secondary | ICD-10-CM | POA: Diagnosis not present

## 2022-05-30 DIAGNOSIS — D497 Neoplasm of unspecified behavior of endocrine glands and other parts of nervous system: Secondary | ICD-10-CM | POA: Diagnosis not present

## 2022-05-31 DIAGNOSIS — D497 Neoplasm of unspecified behavior of endocrine glands and other parts of nervous system: Secondary | ICD-10-CM | POA: Diagnosis not present

## 2022-05-31 DIAGNOSIS — E232 Diabetes insipidus: Secondary | ICD-10-CM | POA: Diagnosis not present

## 2022-05-31 DIAGNOSIS — D444 Neoplasm of uncertain behavior of craniopharyngeal duct: Secondary | ICD-10-CM | POA: Diagnosis not present

## 2022-06-01 DIAGNOSIS — D444 Neoplasm of uncertain behavior of craniopharyngeal duct: Secondary | ICD-10-CM | POA: Diagnosis not present

## 2022-06-01 DIAGNOSIS — E232 Diabetes insipidus: Secondary | ICD-10-CM | POA: Diagnosis not present

## 2022-06-02 DIAGNOSIS — D444 Neoplasm of uncertain behavior of craniopharyngeal duct: Secondary | ICD-10-CM | POA: Diagnosis not present

## 2022-06-02 DIAGNOSIS — E232 Diabetes insipidus: Secondary | ICD-10-CM | POA: Diagnosis not present

## 2022-06-04 DIAGNOSIS — D444 Neoplasm of uncertain behavior of craniopharyngeal duct: Secondary | ICD-10-CM | POA: Diagnosis not present

## 2022-06-04 DIAGNOSIS — E232 Diabetes insipidus: Secondary | ICD-10-CM | POA: Diagnosis not present

## 2022-06-04 DIAGNOSIS — E871 Hypo-osmolality and hyponatremia: Secondary | ICD-10-CM | POA: Diagnosis not present

## 2022-06-05 DIAGNOSIS — D444 Neoplasm of uncertain behavior of craniopharyngeal duct: Secondary | ICD-10-CM | POA: Diagnosis not present

## 2022-06-05 DIAGNOSIS — E232 Diabetes insipidus: Secondary | ICD-10-CM | POA: Diagnosis not present

## 2022-06-05 DIAGNOSIS — E871 Hypo-osmolality and hyponatremia: Secondary | ICD-10-CM | POA: Diagnosis not present

## 2022-06-08 DIAGNOSIS — D444 Neoplasm of uncertain behavior of craniopharyngeal duct: Secondary | ICD-10-CM | POA: Diagnosis not present

## 2022-06-09 DIAGNOSIS — D444 Neoplasm of uncertain behavior of craniopharyngeal duct: Secondary | ICD-10-CM | POA: Diagnosis not present

## 2022-06-10 ENCOUNTER — Telehealth: Payer: Self-pay | Admitting: *Deleted

## 2022-06-10 NOTE — Patient Outreach (Signed)
  Care Coordination TOC Note Transition Care Management Follow-up Telephone Call Date of discharge and from where: [facility] 06/09/2022 How have you been since you were released from the hospital?  feeling better a little foggy and better since slept in own bed. He wanted me to talk with sister to go over the discharge instruction.  Any questions or concerns? No  Items Reviewed: Did the pt receive and understand the discharge instructions provided? Yes  Medications obtained and verified? Yes  Sister didn't didn't realize that some of the medications was over the counter and will have to go back to get them.  Other? Yes  Measure your urine outpatient and your fluid intake and record the numbers in a log. If your urine outpatient exceeds your fluid intake by 1L OR you wake up more than 2 times at night to pee, please give [place] a call.  Do not intake more than 1.5 liters of liquid in a single day. No nose blowing. It is normal to have sinus congestion.  Do not use a straw for drinking  Do not take you hydrocortisone the on 12/7PM and 12/8 AM, the morning of your appointment, so can retest your steroid levels in clinic. Eat a diet high in lean protein and low in sugar/concentrated carbs to reduce the risk of infection and promote wound healing  Any new allergies since your discharge? No  Dietary orders reviewed? No Do you have support at home? Yes   Home Care and Equipment/Supplies: Were home health services ordered? not applicable If so, what is the name of the agency? N/a  Has the agency set up a time to come to the patient's home? not applicable Were any new equipment or medical supplies ordered?  No What is the name of the medical supply agency? N/a Were you able to get the supplies/equipment? not applicable Do you have any questions related to the use of the equipment or supplies? No  Functional Questionnaire: (I = Independent and D = Dependent) ADLs: I  Bathing/Dressing- I  Meal Prep-  D  Eating- I  Maintaining continence- I  Transferring/Ambulation- I  Managing Meds- I  Follow up appointments reviewed:  PCP Hospital f/u appt confirmed? No  . [facility] f/u appt confirmed? Yes  06/16/2022 2:15 PM Ginger Carne, MD [facility] 06/17/2022 9:30 AM Vickki Hearing, MD [facility] 06/19/2022 8:15 AM [place]  Are transportation arrangements needed? No  If their condition worsens, is the pt aware to call PCP or go to the Emergency Dept.? Yes Was the patient provided with contact information for the PCP's office or ED? Yes Was to pt encouraged to call back with questions or concerns? Yes  SDOH assessments and interventions completed:   Yes SDOH Interventions Today    Flowsheet Row Most Recent Value  SDOH Interventions   Housing Interventions Intervention Not Indicated  Transportation Interventions Intervention Not Indicated       Care Coordination Interventions:  No Care Coordination interventions needed at this time.   Encounter Outcome:  Pt. Visit Completed    [place] [facility] [phone]

## 2022-06-16 ENCOUNTER — Other Ambulatory Visit: Payer: Self-pay

## 2022-06-16 DIAGNOSIS — D444 Neoplasm of uncertain behavior of craniopharyngeal duct: Secondary | ICD-10-CM | POA: Diagnosis not present

## 2022-06-16 DIAGNOSIS — J3489 Other specified disorders of nose and nasal sinuses: Secondary | ICD-10-CM | POA: Diagnosis not present

## 2022-06-16 DIAGNOSIS — I10 Essential (primary) hypertension: Secondary | ICD-10-CM

## 2022-06-16 DIAGNOSIS — E236 Other disorders of pituitary gland: Secondary | ICD-10-CM | POA: Diagnosis not present

## 2022-06-16 MED ORDER — LISINOPRIL 10 MG PO TABS: 10.0000 mg | ORAL_TABLET | Freq: Every day | ORAL | 0 refills | Status: DC

## 2022-06-17 DIAGNOSIS — H539 Unspecified visual disturbance: Secondary | ICD-10-CM | POA: Diagnosis not present

## 2022-06-17 DIAGNOSIS — D444 Neoplasm of uncertain behavior of craniopharyngeal duct: Secondary | ICD-10-CM | POA: Diagnosis not present

## 2022-06-17 DIAGNOSIS — H538 Other visual disturbances: Secondary | ICD-10-CM | POA: Diagnosis not present

## 2022-06-17 DIAGNOSIS — R9089 Other abnormal findings on diagnostic imaging of central nervous system: Secondary | ICD-10-CM | POA: Diagnosis not present

## 2022-06-19 DIAGNOSIS — R9431 Abnormal electrocardiogram [ECG] [EKG]: Secondary | ICD-10-CM | POA: Diagnosis not present

## 2022-06-19 DIAGNOSIS — N179 Acute kidney failure, unspecified: Secondary | ICD-10-CM | POA: Diagnosis not present

## 2022-06-19 DIAGNOSIS — Z85841 Personal history of malignant neoplasm of brain: Secondary | ICD-10-CM | POA: Diagnosis not present

## 2022-06-19 DIAGNOSIS — E2749 Other adrenocortical insufficiency: Secondary | ICD-10-CM | POA: Diagnosis not present

## 2022-06-19 DIAGNOSIS — R000 Tachycardia, unspecified: Secondary | ICD-10-CM | POA: Diagnosis not present

## 2022-06-19 DIAGNOSIS — E232 Diabetes insipidus: Secondary | ICD-10-CM | POA: Diagnosis not present

## 2022-06-19 DIAGNOSIS — Z87891 Personal history of nicotine dependence: Secondary | ICD-10-CM | POA: Diagnosis not present

## 2022-06-19 DIAGNOSIS — E230 Hypopituitarism: Secondary | ICD-10-CM | POA: Diagnosis not present

## 2022-06-19 DIAGNOSIS — E875 Hyperkalemia: Secondary | ICD-10-CM | POA: Diagnosis not present

## 2022-06-22 ENCOUNTER — Telehealth: Payer: Self-pay | Admitting: Family Medicine

## 2022-06-22 ENCOUNTER — Other Ambulatory Visit: Payer: Self-pay | Admitting: Family Medicine

## 2022-06-22 DIAGNOSIS — R944 Abnormal results of kidney function studies: Secondary | ICD-10-CM

## 2022-06-22 NOTE — Telephone Encounter (Signed)
Copied from [place] [id number]. Topic: Appointment Scheduling - Scheduling Inquiry for Clinic >> Jun 22, 2022 11:24 AM Erskine Squibb wrote: Reason for CRM: The patient called in stating he had recent brain surgery at [facility]. He was discharged on 11/29. They are stating he needs recent lab work for kidney function and basic metabolic panel done pertaining to his pituitary gland. He is scheduled to see his provider next Wednesday but he states he would like to get the labs done sometime this week if possible. Please assist patient further.

## 2022-06-30 NOTE — Progress Notes (Unsigned)
Name: Ernest Sweeney   MRN: [id number]    DOB: 11/29/1950   Date:07/01/2022       Progress Note  Subjective  Chief Complaint  Follow Up  HPI  History of craniopharyngioma resection: admitted 11/16 and discharged 11/18 , he had to be re-intubated two hours after procedure due to bleeding and was admitted to ICU, he got agitated for possible alcohol withdrawal, he had increase in urinary output and was placed on DDAVP Leucopenia: last WBC was stable at 2.8. He had COVID in May but recovered quickly. Since surgery he was seen by Dr. Almyra Free, Endo at [facility] and diagnosed with adrenal insufficiency  and was started on hydrocortisone 10 mg am and '5mg'$  in pm . 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 BP is at goal today without taking lisinopril we will recheck BMP and keep lisinopril on hold for now    Major Depression: seen January 2020 and phq 9 was very high at 57, we started him on Zofot 50 mg but he  stopped Zofot because he read the side effects, he has been off medication for a while now and pqh 9 is positive  He does not want to take any medications for that at this time    Alcohol use disorder: he always drank, used to beer when he was younger, but since his mid 20's he started to drink liquor, since his 58's he has been drinking heavier, mostly on weekends and worse since COVID-19, since surgery he has not drank any alcohol, staying with his sister . Advised him to stay off   HTN: he is down to 10 mg per day, did not take it this morning, but usually takes it daily. Denies chest pain or palpitaton We will recheck potassium before resuming lisinopril as recommended by Endo     Chronic bronchitis/Emphysema : he used Breo in the past but he stopped medication on his own, quit smoking in 2016 he is doing well, no cough, wheezing  or SOB,    Hyperlipidemia/Atherosclerosis of Aorta: taking aspirin and rosuvastatin. LDL went from 80 to 64 but went up again to over 70 Dec 2022, he is now on Crestor 40 , last level at goal   History of prostate cancer dx 2016 : last PSA was normal seeing Dr. Baruch Gouty every 12  months now. He denies urinary urgency and nocturia has resolved .Unchanged   Patient Active Problem List   Diagnosis Date Noted   Other neutropenia ([place]) 12/30/2021   Intermittent low back pain 12/30/2021   Essential hypertension 12/30/2021   Left inguinal hernia 12/30/2021   History of right inguinal hernia repair    Alcoholism ([place]) 06/28/2020   Major depression in remission ([place]) 06/28/2020   Lung nodules 09/04/2018   Chronic bronchitis ([place]) 04/23/2017   Atherosclerosis of aorta ([place]) 04/23/2017   Coronary artery disease due to calcified coronary lesion 04/23/2017   Glaucoma, left eye 08/04/2016   History of prostate cancer 07/15/2016   History of prostatectomy 07/15/2016   Vitamin D deficiency 05/31/2015   Dyslipidemia 05/31/2015   Seasonal allergic rhinitis 05/31/2015   Chronic pain of both shoulders 05/31/2015   Family history of  malignant neoplasm of prostate 07/26/2012   Hematuria, microscopic 07/26/2012    Past Surgical History:  Procedure Laterality Date   COLONOSCOPY  07/13/2008   Dr Sula Rumple   COLONOSCOPY WITH PROPOFOL N/A 01/05/2020   Procedure: COLONOSCOPY WITH PROPOFOL;  Surgeon: [name], MD;  Location: [facility] ENDOSCOPY;  Service: Gastroenterology;  Laterality: N/A;   excision skin cyst  06/19/2014   Dr. Festus Aloe CYST   INGUINAL HERNIA REPAIR Right 09/06/2020   Procedure: HERNIA REPAIR RIGH  INGUINAL WITH MESH ADULT, open with RNFA to assist;  Surgeon: Fredirick Maudlin, MD;  Location: [facility] ORS;  Service: General;   Laterality: Right;  Provider requesting 2 hours/120 minutes for procedure.   Neuroendoscopy  05/28/2022   With excision pituitary tumor transnasal or transphenoidal approach. Resection of craniopharyngioma.   PELVIC LYMPH NODE DISSECTION N/A 07/15/2016   Procedure: PELVIC LYMPH NODE DISSECTION;  Surgeon: Nickie Retort, MD;  Location: [facility] ORS;  Service: Urology;  Laterality: N/A;   ROBOT ASSISTED LAPAROSCOPIC RADICAL PROSTATECTOMY N/A 07/15/2016   Procedure: ROBOTIC ASSISTED LAPAROSCOPIC RADICAL PROSTATECTOMY;  Surgeon: Nickie Retort, MD;  Location: [facility] ORS;  Service: Urology;  Laterality: N/A;    Family History  Problem Relation Age of Onset   Heart disease Mother    Heart attack Mother    Diabetes Father    Heart attack Father    Dementia Father    Cancer Sister        Breast   Cancer Brother        Prostate   Healthy Sister    Stroke Brother     Social History   Tobacco Use   Smoking status: Former    Packs/day: 0.50    Years: 40.00    Total pack years: 20.00    Types: Cigarettes    Quit date: 03/23/2016    Years since quitting: 6.2   Smokeless tobacco: Never  Substance Use Topics   Alcohol use: Yes    Alcohol/week: 2.0 - 4.0 standard drinks of alcohol    Types: 2 - 4 Standard drinks or equivalent per week    Comment: 1-2 times per week     Current Outpatient Medications:    aspirin 81 MG tablet, Take 81 mg by mouth daily., Disp: , Rfl:    brimonidine-timolol (COMBIGAN) 0.2-0.5 % ophthalmic solution, Place 1 drop into both eyes every 12 (twelve) hours., Disp: , Rfl:    Cholecalciferol (VITAMIN D) 2000 units CAPS, Take 1 capsule (2,000 Units total) by mouth daily., Disp: 30 capsule, Rfl: 0   fluticasone (FLONASE) 50 MCG/ACT nasal spray, Place 2 sprays into both nostrils daily., Disp: 16 g, Rfl: 6   latanoprost (XALATAN) 0.005 % ophthalmic solution, Place 1 drop into both eyes at bedtime. , Disp: , Rfl:    lisinopril (ZESTRIL) 10 MG tablet, Take 1 tablet  (10 mg total) by mouth daily., Disp: 90 tablet, Rfl: 0   rosuvastatin (CRESTOR) 40 MG tablet, Take 1 tablet (40 mg total) by mouth daily. In place of Atorvastatin, Disp: 90 tablet, Rfl: 1   sildenafil (VIAGRA) 100 MG tablet, Take 1 tablet (100 mg total) by mouth daily as needed for erectile dysfunction., Disp: 30 tablet, Rfl: 6   tiZANidine (ZANAFLEX) 2 MG tablet, Take 1-2 tablets (2-4 mg total) by mouth every 6 (six) hours as needed for muscle spasms., Disp: 60 tablet, Rfl: 0   triamcinolone cream (KENALOG) 0.1 %, APPLY TOPICALLY AS DIRECTED TWICE DAILY, Disp: 45 g, Rfl: 0  No Known Allergies  I personally reviewed active problem list, medication list, allergies, family history, social history, health maintenance with the patient/caregiver today.   ROS  Ten systems reviewed and is negative except as mentioned in HPI   Objective  Vitals:   07/01/22 0823  BP: 122/74  Pulse: 92  Resp: 16  Temp: 97.8 F (36.6 C)  TempSrc: Oral  SpO2: 98%  Weight: 165 lb (74.8 kg)  Height: '5\' 9"'$  (1.753 m)    Body mass index is 24.37 kg/m.  Physical Exam  Constitutional: Patient appears well-developed and well-nourished.  No distress.  HEENT: head atraumatic, normocephalic, pupils equal and reactive to light, neck supple, throat within normal limits. He still has peripheral vision loss worse on left side Cardiovascular: Normal rate, regular rhythm and normal heart sounds.  No murmur heard. No BLE edema. Pulmonary/Chest: Effort normal and breath sounds normal. No respiratory distress. Abdominal: Soft.  There is no tenderness. Psychiatric: Patient has a normal mood and affect. behavior is normal. Judgment and thought content normal.   Recent Results (from the past 2160 hour(s))  PSA     Status: None   Collection Time: 04/06/22  8:26 AM  Result Value Ref Range   Prostatic Specific Antigen <0.01 0.00 - 4.00 ng/mL    Comment: (NOTE) While PSA levels of <=4.0 ng/ml are reported as reference  range, some men with levels below 4.0 ng/ml can have prostate cancer and many men with PSA above 4.0 ng/ml do not have prostate cancer.  Other tests such as free PSA, age specific reference ranges, PSA velocity and PSA doubling time may be helpful especially in men less than 78 years old. Performed at [facility], [place] [address], [place], [place] [address]     PHQ2/9:    07/01/2022    8:22 AM 03/03/2022   11:00 AM 12/30/2021    9:32 AM 11/11/2021   10:22 AM 07/01/2021    7:51 AM  Depression screen PHQ 2/9  Decreased Interest 2 1 0 0 0  Down, Depressed, Hopeless 2 1 0 0 0  PHQ - 2 Score 4 2 0 0 0  Altered sleeping 3 2 0 0 0  Tired, decreased energy  2 0 0 0  Change in appetite 0 1 0 0 0  Feeling bad or failure about yourself  0 0 0 0 0  Trouble concentrating 0 0 0 0 0  Moving slowly or fidgety/restless 0 0 0 0 0  Suicidal thoughts 0 0 0 0 0  PHQ-9 Score 7 7 0 0 0  Difficult doing work/chores  Somewhat difficult  Not difficult at all     phq 9 is positive   Fall Risk:    07/01/2022    8:22 AM 03/03/2022   10:55 AM 12/30/2021    9:31 AM 11/11/2021   10:21 AM 07/01/2021    7:51 AM  Fall Risk   Falls in the past year? 0 0 0 0 0  Number falls in past yr: 0  0 0 0  Injury with Fall? 0  0 0 0  Risk for fall due to : No Fall Risks No Fall Risks No Fall Risks  No Fall Risks  Follow up Falls prevention discussed Education provided;Falls evaluation completed;Falls prevention discussed Falls prevention discussed  Falls prevention discussed      Functional Status Survey: Is the patient deaf or have difficulty hearing?: No Does the patient have difficulty seeing, even when wearing glasses/contacts?: Yes Does the patient have difficulty concentrating,  remembering, or making decisions?: No Does the patient have difficulty walking or climbing stairs?: No Does the patient have difficulty dressing or bathing?: No Does the patient have difficulty doing errands alone such as  visiting a doctor's office or shopping?: No    Assessment & Plan  1. Simple chronic bronchitis (HCC)  Stable   2. Atherosclerosis of aorta (HCC)  On Crestor   3. Major depression in remission ([place])  Stable   4. Alcoholism ([place])  States not drinking now   5. Vitamin D deficiency   6. Essential hypertension  Lisinopril is on hold  7. Atherosclerosis of native coronary artery of native heart without angina pectoris  On statin therapy   8. Hypopituitarism after procedure [facility])  Must follow up with Endo   We will contact Felicia, he needs chronic care management   9. Adrenal insufficiency (Addison's disease) ([place])  Under the care of Endo  10. Diabetes insipidus ([place])   Under the care of Endo

## 2022-07-01 ENCOUNTER — Ambulatory Visit: Payer: Medicare HMO | Admitting: Family Medicine

## 2022-07-01 ENCOUNTER — Ambulatory Visit (INDEPENDENT_AMBULATORY_CARE_PROVIDER_SITE_OTHER): Payer: Medicare HMO | Admitting: Family Medicine

## 2022-07-01 ENCOUNTER — Encounter: Payer: Self-pay | Admitting: Family Medicine

## 2022-07-01 VITALS — BP 122/74 | HR 92 | Temp 97.8°F | Resp 16 | Ht 69.0 in | Wt 165.0 lb

## 2022-07-01 DIAGNOSIS — E232 Diabetes insipidus: Secondary | ICD-10-CM

## 2022-07-01 DIAGNOSIS — I10 Essential (primary) hypertension: Secondary | ICD-10-CM

## 2022-07-01 DIAGNOSIS — J410 Simple chronic bronchitis: Secondary | ICD-10-CM | POA: Diagnosis not present

## 2022-07-01 DIAGNOSIS — F325 Major depressive disorder, single episode, in full remission: Secondary | ICD-10-CM

## 2022-07-01 DIAGNOSIS — R69 Illness, unspecified: Secondary | ICD-10-CM | POA: Diagnosis not present

## 2022-07-01 DIAGNOSIS — I700 Atherosclerosis of aorta: Secondary | ICD-10-CM | POA: Diagnosis not present

## 2022-07-01 DIAGNOSIS — R944 Abnormal results of kidney function studies: Secondary | ICD-10-CM | POA: Diagnosis not present

## 2022-07-01 DIAGNOSIS — E271 Primary adrenocortical insufficiency: Secondary | ICD-10-CM

## 2022-07-01 DIAGNOSIS — F1020 Alcohol dependence, uncomplicated: Secondary | ICD-10-CM | POA: Diagnosis not present

## 2022-07-01 DIAGNOSIS — E893 Postprocedural hypopituitarism: Secondary | ICD-10-CM | POA: Diagnosis not present

## 2022-07-01 DIAGNOSIS — E559 Vitamin D deficiency, unspecified: Secondary | ICD-10-CM | POA: Diagnosis not present

## 2022-07-01 DIAGNOSIS — I2510 Atherosclerotic heart disease of native coronary artery without angina pectoris: Secondary | ICD-10-CM | POA: Diagnosis not present

## 2022-07-02 ENCOUNTER — Other Ambulatory Visit: Payer: Self-pay

## 2022-07-02 DIAGNOSIS — R944 Abnormal results of kidney function studies: Secondary | ICD-10-CM

## 2022-07-02 LAB — BASIC METABOLIC PANEL WITH GFR
BUN/Creatinine Ratio: 8 (calc) (ref 6–22)
BUN: 15 mg/dL (ref 7–25)
CO2: 31 mmol/L (ref 20–32)
Calcium: 10.3 mg/dL (ref 8.6–10.3)
Chloride: 108 mmol/L (ref 98–110)
Creat: 1.88 mg/dL — ABNORMAL HIGH (ref 0.70–1.28)
Glucose, Bld: 87 mg/dL (ref 65–139)
Potassium: 4.8 mmol/L (ref 3.5–5.3)
Sodium: 147 mmol/L — ABNORMAL HIGH (ref 135–146)
eGFR: 38 mL/min/{1.73_m2} — ABNORMAL LOW (ref >=60)

## 2022-07-08 ENCOUNTER — Other Ambulatory Visit: Payer: Medicare HMO

## 2022-07-08 DIAGNOSIS — R944 Abnormal results of kidney function studies: Secondary | ICD-10-CM | POA: Diagnosis not present

## 2022-07-08 LAB — BASIC METABOLIC PANEL
BUN/Creatinine Ratio: 9 (calc) (ref 6–22)
BUN: 21 mg/dL (ref 7–25)
CO2: 33 mmol/L — ABNORMAL HIGH (ref 20–32)
Calcium: 10.9 mg/dL — ABNORMAL HIGH (ref 8.6–10.3)
Chloride: 106 mmol/L (ref 98–110)
Creat: 2.47 mg/dL — ABNORMAL HIGH (ref 0.70–1.28)
Glucose, Bld: 123 mg/dL — ABNORMAL HIGH (ref 65–99)
Potassium: 5.2 mmol/L (ref 3.5–5.3)
Sodium: 146 mmol/L (ref 135–146)

## 2022-07-15 ENCOUNTER — Other Ambulatory Visit: Payer: Self-pay | Admitting: Family Medicine

## 2022-07-15 DIAGNOSIS — R944 Abnormal results of kidney function studies: Secondary | ICD-10-CM | POA: Diagnosis not present

## 2022-07-15 LAB — BASIC METABOLIC PANEL WITH GFR
BUN/Creatinine Ratio: 10 (calc) (ref 6–22)
BUN: 32 mg/dL — ABNORMAL HIGH (ref 7–25)
CO2: 31 mmol/L (ref 20–32)
Calcium: 9.3 mg/dL (ref 8.6–10.3)
Chloride: 103 mmol/L (ref 98–110)
Creat: 3.32 mg/dL — ABNORMAL HIGH (ref 0.70–1.28)
Glucose, Bld: 164 mg/dL — ABNORMAL HIGH (ref 65–99)
Potassium: 4.5 mmol/L (ref 3.5–5.3)
Sodium: 140 mmol/L (ref 135–146)
eGFR: 19 mL/min/{1.73_m2} — ABNORMAL LOW (ref >=60)

## 2022-07-16 ENCOUNTER — Other Ambulatory Visit: Payer: Self-pay | Admitting: Family Medicine

## 2022-07-16 DIAGNOSIS — N179 Acute kidney failure, unspecified: Secondary | ICD-10-CM

## 2022-07-23 DIAGNOSIS — N179 Acute kidney failure, unspecified: Secondary | ICD-10-CM | POA: Diagnosis not present

## 2022-07-23 DIAGNOSIS — R809 Proteinuria, unspecified: Secondary | ICD-10-CM | POA: Diagnosis not present

## 2022-07-23 DIAGNOSIS — R8290 Unspecified abnormal findings in urine: Secondary | ICD-10-CM | POA: Diagnosis not present

## 2022-07-30 DIAGNOSIS — N29 Other disorders of kidney and ureter in diseases classified elsewhere: Secondary | ICD-10-CM | POA: Diagnosis not present

## 2022-07-30 DIAGNOSIS — N184 Chronic kidney disease, stage 4 (severe): Secondary | ICD-10-CM | POA: Diagnosis not present

## 2022-07-30 DIAGNOSIS — N2581 Secondary hyperparathyroidism of renal origin: Secondary | ICD-10-CM | POA: Diagnosis not present

## 2022-07-30 DIAGNOSIS — D808 Other immunodeficiencies with predominantly antibody defects: Secondary | ICD-10-CM | POA: Diagnosis not present

## 2022-08-04 DIAGNOSIS — D444 Neoplasm of uncertain behavior of craniopharyngeal duct: Secondary | ICD-10-CM | POA: Diagnosis not present

## 2022-08-04 DIAGNOSIS — Z9889 Other specified postprocedural states: Secondary | ICD-10-CM | POA: Diagnosis not present

## 2022-08-04 DIAGNOSIS — J3489 Other specified disorders of nose and nasal sinuses: Secondary | ICD-10-CM | POA: Diagnosis not present

## 2022-08-06 DIAGNOSIS — D444 Neoplasm of uncertain behavior of craniopharyngeal duct: Secondary | ICD-10-CM | POA: Diagnosis not present

## 2022-08-07 ENCOUNTER — Telehealth: Payer: Self-pay | Admitting: *Deleted

## 2022-08-07 NOTE — Telephone Encounter (Signed)
Nurse placed call to patient to review appointment details for upcoming new patient consultation visit. Nurse verified appointment date and time with patient. All questions regarding location and lab visit after appointment answered. Patient verbalized understanding.

## 2022-08-10 ENCOUNTER — Other Ambulatory Visit: Payer: Self-pay | Admitting: Oncology

## 2022-08-10 ENCOUNTER — Inpatient Hospital Stay: Payer: Medicare HMO | Attending: Oncology | Admitting: Oncology

## 2022-08-10 ENCOUNTER — Inpatient Hospital Stay: Payer: Medicare HMO

## 2022-08-10 ENCOUNTER — Encounter: Payer: Self-pay | Admitting: Oncology

## 2022-08-10 ENCOUNTER — Telehealth: Payer: Self-pay

## 2022-08-10 ENCOUNTER — Other Ambulatory Visit: Payer: Self-pay

## 2022-08-10 VITALS — BP 119/96 | HR 88 | Temp 96.9°F | Resp 16 | Ht 69.0 in | Wt 172.0 lb

## 2022-08-10 DIAGNOSIS — Z79899 Other long term (current) drug therapy: Secondary | ICD-10-CM | POA: Insufficient documentation

## 2022-08-10 DIAGNOSIS — D808 Other immunodeficiencies with predominantly antibody defects: Secondary | ICD-10-CM

## 2022-08-10 DIAGNOSIS — C61 Malignant neoplasm of prostate: Secondary | ICD-10-CM

## 2022-08-10 DIAGNOSIS — N179 Acute kidney failure, unspecified: Secondary | ICD-10-CM | POA: Diagnosis not present

## 2022-08-10 DIAGNOSIS — D72819 Decreased white blood cell count, unspecified: Secondary | ICD-10-CM

## 2022-08-10 DIAGNOSIS — Z87891 Personal history of nicotine dependence: Secondary | ICD-10-CM | POA: Diagnosis not present

## 2022-08-10 DIAGNOSIS — Z923 Personal history of irradiation: Secondary | ICD-10-CM

## 2022-08-10 DIAGNOSIS — Z803 Family history of malignant neoplasm of breast: Secondary | ICD-10-CM

## 2022-08-10 DIAGNOSIS — Z8639 Personal history of other endocrine, nutritional and metabolic disease: Secondary | ICD-10-CM | POA: Diagnosis not present

## 2022-08-10 DIAGNOSIS — Z8042 Family history of malignant neoplasm of prostate: Secondary | ICD-10-CM | POA: Diagnosis not present

## 2022-08-10 LAB — CBC WITH DIFFERENTIAL/PLATELET
Abs Immature Granulocytes: 0.01 10*3/uL (ref 0.00–0.07)
Basophils Absolute: 0 10*3/uL (ref 0.0–0.1)
Basophils Relative: 1 %
Eosinophils Absolute: 0.1 10*3/uL (ref 0.0–0.5)
Eosinophils Relative: 3 %
HCT: 38.1 % — ABNORMAL LOW (ref 39.0–52.0)
Hemoglobin: 12.8 g/dL — ABNORMAL LOW (ref 13.0–17.0)
Immature Granulocytes: 0 %
Lymphocytes Relative: 36 %
Lymphs Abs: 1.1 10*3/uL (ref 0.7–4.0)
MCH: 32.2 pg (ref 26.0–34.0)
MCHC: 33.6 g/dL (ref 30.0–36.0)
MCV: 95.7 fL (ref 80.0–100.0)
Monocytes Absolute: 0.3 10*3/uL (ref 0.1–1.0)
Monocytes Relative: 10 %
Neutro Abs: 1.5 10*3/uL — ABNORMAL LOW (ref 1.7–7.7)
Neutrophils Relative %: 50 %
Platelets: 309 10*3/uL (ref 150–400)
RBC: 3.98 MIL/uL — ABNORMAL LOW (ref 4.22–5.81)
RDW: 12.5 % (ref 11.5–15.5)
WBC: 2.9 10*3/uL — ABNORMAL LOW (ref 4.0–10.5)
nRBC: 0 % (ref 0.0–0.2)

## 2022-08-10 LAB — COMPREHENSIVE METABOLIC PANEL
ALT: 15 U/L (ref 0–44)
AST: 18 U/L (ref 15–41)
Albumin: 4.6 g/dL (ref 3.5–5.0)
Alkaline Phosphatase: 66 U/L (ref 38–126)
Anion gap: 8 (ref 5–15)
BUN: 15 mg/dL (ref 8–23)
CO2: 28 mmol/L (ref 22–32)
Calcium: 9.9 mg/dL (ref 8.9–10.3)
Chloride: 102 mmol/L (ref 98–111)
Creatinine, Ser: 1.7 mg/dL — ABNORMAL HIGH (ref 0.61–1.24)
GFR, Estimated: 43 mL/min — ABNORMAL LOW (ref >60)
Glucose, Bld: 87 mg/dL (ref 70–99)
Potassium: 4.3 mmol/L (ref 3.5–5.1)
Sodium: 138 mmol/L (ref 135–145)
Total Bilirubin: 0.7 mg/dL (ref 0.3–1.2)
Total Protein: 8.1 g/dL (ref 6.5–8.1)

## 2022-08-10 NOTE — Telephone Encounter (Signed)
BMB scheduled for Wed 2/7 at 8:30a an arrive at 7:30a. Patient is aware of date and time and expressed understanding.

## 2022-08-10 NOTE — Progress Notes (Signed)
Ernest Sweeney  [phone] [phone]  ID: Ernest Sweeney OB: June 21, 1951  MR#: [id number]  FGH#:[id number]  Patient Care Team: [name], MD as PCP - General (Family Medicine) Abbie Sons, MD as Consulting Physician (Urology) Billey Co, MD as Consulting Physician (Urology) Noreene Filbert, MD as Referring Physician (Radiation Oncology) Yolonda Kida, MD as Consulting Physician (Cardiology)  CHIEF COMPLAINT: Possible lambda chain myeloma.  INTERVAL HISTORY: Patient is a 72 year old male with a complicated past medical history which includes prostate cancer, recent pituitary tumor requiring surgical removal, and now acute renal failure possibly related to lambda chain myeloma.  He continues to have a mild headache, but otherwise feels well.  He has no other neurologic complaints.  He denies any recent fevers or illnesses.  He has a good appetite and denies weight loss.  He has no chest pain, shortness of breath, cough, or hemoptysis.  He denies any nausea, vomiting, constipation, or diarrhea.  He has no urinary complaints.  Patient offers no further specific complaints today.  REVIEW OF SYSTEMS:   Review of Systems  Constitutional: Negative.  Negative for fever, malaise/fatigue and weight loss.  Eyes:  Negative for blurred vision and double vision.  Respiratory: Negative.  Negative for cough, hemoptysis and shortness of breath.   Cardiovascular: Negative.  Negative for chest pain and leg swelling.  Gastrointestinal: Negative.  Negative for abdominal pain.  Genitourinary: Negative.  Negative for dysuria.  Musculoskeletal: Negative.  Negative for back pain.  Skin: Negative.  Negative for rash.  Neurological:  Positive for headaches. Negative for dizziness, focal weakness and weakness.  Psychiatric/Behavioral: Negative.  The patient is not nervous/anxious.     As per HPI. Otherwise, a complete review of systems is  negative.  PAST MEDICAL HISTORY: Past Medical History:  Diagnosis Date   Anxiety    Panic attacks in the past   Arthritis    Benign prostatic hypertrophy without lower urinary tract symptoms    Cancer (HCC)    Prostate. monitoring now since surgery   Colon polyp    GERD (gastroesophageal reflux disease)    Glaucoma (increased eye pressure)    Left eye only   Hyperlipidemia    Hypertension     PAST SURGICAL HISTORY: Past Surgical History:  Procedure Laterality Date   COLONOSCOPY  07/13/2008   Dr Sula Rumple   COLONOSCOPY WITH PROPOFOL N/A 01/05/2020   Procedure: COLONOSCOPY WITH PROPOFOL;  Surgeon: [name], MD;  Location: [facility] ENDOSCOPY;  Service: Gastroenterology;  Laterality: N/A;   excision skin cyst  06/19/2014   Dr. Festus Aloe CYST   INGUINAL HERNIA REPAIR Right 09/06/2020   Procedure: HERNIA REPAIR RIGH  INGUINAL WITH MESH ADULT, open with RNFA to assist;  Surgeon: Fredirick Maudlin, MD;  Location: [facility] ORS;  Service: General;  Laterality: Right;  Provider requesting 2 hours/120 minutes for procedure.   Neuroendoscopy  05/28/2022   With excision pituitary tumor transnasal or transphenoidal approach. Resection of craniopharyngioma.   PELVIC LYMPH NODE DISSECTION N/A 07/15/2016   Procedure: PELVIC LYMPH NODE DISSECTION;  Surgeon: Nickie Retort, MD;  Location: [facility] ORS;  Service: Urology;  Laterality: N/A;   ROBOT ASSISTED LAPAROSCOPIC RADICAL PROSTATECTOMY N/A 07/15/2016   Procedure: ROBOTIC ASSISTED LAPAROSCOPIC RADICAL PROSTATECTOMY;  Surgeon: Nickie Retort, MD;  Location: [facility] ORS;  Service: Urology;  Laterality: N/A;    FAMILY HISTORY: Family History  Problem Relation Age of Onset   Heart disease Mother    Heart attack Mother    Diabetes  Father    Heart attack Father    Dementia Father    Cancer Sister        Breast   Cancer Brother        Prostate   Healthy Sister    Stroke Brother     ADVANCED DIRECTIVES (Y/N):  N  HEALTH  MAINTENANCE: Social History   Tobacco Use   Smoking status: Former    Packs/day: 0.50    Years: 40.00    Total pack years: 20.00    Types: Cigarettes    Quit date: 03/23/2016    Years since quitting: 6.3   Smokeless tobacco: Never  Vaping Use   Vaping Use: Never used  Substance Use Topics   Alcohol use: Yes    Alcohol/week: 2.0 - 4.0 standard drinks of alcohol    Types: 2 - 4 Standard drinks or equivalent per week    Comment: 1-2 times per week   Drug use: No     Colonoscopy:  PAP:  Bone density:  Lipid panel:  No Known Allergies  Current Outpatient Medications  Medication Sig Dispense Refill   aspirin 81 MG tablet Take 81 mg by mouth daily.     brimonidine-timolol (COMBIGAN) 0.2-0.5 % ophthalmic solution Place 1 drop into both eyes every 12 (twelve) hours.     Cholecalciferol (VITAMIN D) 2000 units CAPS Take 1 capsule (2,000 Units total) by mouth daily. 30 capsule 0   fluticasone (FLONASE) 50 MCG/ACT nasal spray Place 2 sprays into both nostrils daily. 16 g 6   latanoprost (XALATAN) 0.005 % ophthalmic solution Place 1 drop into both eyes at bedtime.      rosuvastatin (CRESTOR) 40 MG tablet Take 1 tablet (40 mg total) by mouth daily. In place of Atorvastatin 90 tablet 1   sildenafil (VIAGRA) 100 MG tablet Take 1 tablet (100 mg total) by mouth daily as needed for erectile dysfunction. 30 tablet 6   thiamine (VITAMIN B1) 100 MG tablet Take 100 mg by mouth daily.     triamcinolone cream (KENALOG) 0.1 % APPLY TOPICALLY AS DIRECTED TWICE DAILY 45 g 0   lisinopril (ZESTRIL) 10 MG tablet Take 1 tablet (10 mg total) by mouth daily. (Patient not taking: Reported on 08/10/2022) 90 tablet 0   tiZANidine (ZANAFLEX) 2 MG tablet Take 1-2 tablets (2-4 mg total) by mouth every 6 (six) hours as needed for muscle spasms. (Patient not taking: Reported on 08/10/2022) 60 tablet 0   No current facility-administered medications for this visit.    OBJECTIVE: Vitals:   08/10/22 1120  BP: (!)  119/96  Pulse: 88  Resp: 16  Temp: (!) 96.9 F (36.1 C)  SpO2: 100%     Body mass index is 25.4 kg/m.    ECOG FS:0 - Asymptomatic  General: Well-developed, well-nourished, no acute distress. Eyes: Pink conjunctiva, anicteric sclera. HEENT: Normocephalic, moist mucous membranes. Lungs: No audible wheezing or coughing. Heart: Regular rate and rhythm. Abdomen: Soft, nontender, no obvious distention. Musculoskeletal: No edema, cyanosis, or clubbing. Neuro: Alert, answering all questions appropriately. Cranial nerves grossly intact. Skin: No rashes or petechiae noted. Psych: Normal affect. Lymphatics: No cervical, calvicular, axillary or inguinal LAD.   LAB RESULTS:  Lab Results  Component Value Date   NA 138 08/10/2022   K 4.3 08/10/2022   CL 102 08/10/2022   CO2 28 08/10/2022   GLUCOSE 87 08/10/2022   BUN 15 08/10/2022   CREATININE 1.70 (H) 08/10/2022   CALCIUM 9.9 08/10/2022   PROT 8.1 08/10/2022  ALBUMIN 4.6 08/10/2022   AST 18 08/10/2022   ALT 15 08/10/2022   ALKPHOS 66 08/10/2022   BILITOT 0.7 08/10/2022   GFRNONAA 43 (L) 08/10/2022   GFRAA 74 05/02/2020    Lab Results  Component Value Date   WBC 2.9 (L) 08/10/2022   NEUTROABS 1.5 (L) 08/10/2022   HGB 12.8 (L) 08/10/2022   HCT 38.1 (L) 08/10/2022   MCV 95.7 08/10/2022   PLT 309 08/10/2022     STUDIES: No results found.  ASSESSMENT: Possible lambda chain myeloma.  PLAN:    Possible lambda chain myeloma: Patient noted to have worsening renal function acutely and a mildly elevated lambda light chain of 267.  Case discussed with nephrology and a kidney biopsy could carry significant morbidity, therefore will proceed with bone marrow biopsy to assess for any plasma cell dyscrasia.  Repeat light chains as well as SPEP are pending at time of dictation.  Return to clinic 1 week after his biopsy to discuss the results. Acute renal failure: Patient's creatinine has improved to 1.7 today.  Continue follow-up with  nephrology as indicated.  Bone marrow biopsy as above.  Patient may ultimately require a kidney biopsy if bone biopsy is negative or inconclusive. Leukopenia: Mild, monitor.  Bone marrow biopsy as above. Pituitary tumor: Patient underwent surgical resection at [facility] in November 2023.  Continue follow-up and imaging with MRI with [facility] neurosurgery. Prostate cancer: Patient underwent prostatectomy followed by XRT for local control.  PSA is monitored by primary care.   Patient expressed understanding and was in agreement with this plan. He also understands that He can call clinic at any time with any questions, concerns, or complaints.    Cancer Staging  No matching staging information was found for the patient.  Lloyd Huger, MD   08/10/2022 12:53 PM

## 2022-08-10 NOTE — Telephone Encounter (Signed)
Form for Bone Marrow Bx sent to Marcie Bal to get done this week or next week per Woodfin Ganja

## 2022-08-11 LAB — IGG, IGA, IGM
IgA: 55 mg/dL — ABNORMAL LOW (ref 61–437)
IgG (Immunoglobin G), Serum: 872 mg/dL (ref 603–1613)
IgM (Immunoglobulin M), Srm: 294 mg/dL — ABNORMAL HIGH (ref 15–143)

## 2022-08-11 LAB — KAPPA/LAMBDA LIGHT CHAINS
Kappa free light chain: 22.1 mg/L — ABNORMAL HIGH (ref 3.3–19.4)
Kappa, lambda light chain ratio: 0.1 — ABNORMAL LOW (ref 0.26–1.65)
Lambda free light chains: 227.6 mg/L — ABNORMAL HIGH (ref 5.7–26.3)

## 2022-08-12 LAB — PROTEIN ELECTRO, RANDOM URINE
Albumin ELP, Urine: 13 %
Alpha-1-Globulin, U: 4 %
Alpha-2-Globulin, U: 5.3 %
Beta Globulin, U: 8 %
Gamma Globulin, U: 69.7 %
M Component, Ur: 60.5 % — ABNORMAL HIGH
Total Protein, Urine: 7.3 mg/dL

## 2022-08-13 DIAGNOSIS — H401132 Primary open-angle glaucoma, bilateral, moderate stage: Secondary | ICD-10-CM | POA: Diagnosis not present

## 2022-08-13 DIAGNOSIS — H5347 Heteronymous bilateral field defects: Secondary | ICD-10-CM | POA: Diagnosis not present

## 2022-08-13 DIAGNOSIS — H2512 Age-related nuclear cataract, left eye: Secondary | ICD-10-CM | POA: Diagnosis not present

## 2022-08-13 LAB — PROTEIN ELECTROPHORESIS, SERUM
A/G Ratio: 1.4 (ref 0.7–1.7)
Albumin ELP: 4.2 g/dL (ref 2.9–4.4)
Alpha-1-Globulin: 0.3 g/dL (ref 0.0–0.4)
Alpha-2-Globulin: 0.7 g/dL (ref 0.4–1.0)
Beta Globulin: 0.9 g/dL (ref 0.7–1.3)
Gamma Globulin: 1 g/dL (ref 0.4–1.8)
Globulin, Total: 2.9 g/dL (ref 2.2–3.9)
M-Spike, %: 0.3 g/dL — ABNORMAL HIGH
Total Protein ELP: 7.1 g/dL (ref 6.0–8.5)

## 2022-08-17 ENCOUNTER — Other Ambulatory Visit: Payer: Self-pay | Admitting: Radiology

## 2022-08-17 NOTE — H&P (Signed)
Chief Complaint: Patient was seen in consultation today for leukopenia at the request of Finnegan,Timothy J  Referring Physician(s): Finnegan,Timothy J  Supervising Physician: Jacqulynn Cadet  Patient Status: [facility] - Out-pt  History of Present Illness: Ernest Sweeney is a 72 y.o. male with PMH of anxiety, prostate cancer s/p prostatectomy, pituitary tumor s/p removal, ARF, GERD, HLD and HTN. Pt is followed by oncology for leukopenia and recent labs resulting in faint band in gamma region suspicious for lambda chain myeloma. Pt has been referred to IR for bone marrow biopsy.   Past Medical History:  Diagnosis Date   Anxiety    Panic attacks in the past   Arthritis    Benign prostatic hypertrophy without lower urinary tract symptoms    Cancer (HCC)    Prostate. monitoring now since surgery   Colon polyp    GERD (gastroesophageal reflux disease)    Glaucoma (increased eye pressure)    Left eye only   Hyperlipidemia    Hypertension     Past Surgical History:  Procedure Laterality Date   COLONOSCOPY  07/13/2008   Dr Sula Rumple   COLONOSCOPY WITH PROPOFOL N/A 01/05/2020   Procedure: COLONOSCOPY WITH PROPOFOL;  Surgeon: [name], MD;  Location: [facility] ENDOSCOPY;  Service: Gastroenterology;  Laterality: N/A;   excision skin cyst  06/19/2014   Dr. Festus Aloe CYST   INGUINAL HERNIA REPAIR Right 09/06/2020   Procedure: HERNIA REPAIR RIGH  INGUINAL WITH MESH ADULT, open with RNFA to assist;  Surgeon: Fredirick Maudlin, MD;  Location: [facility] ORS;  Service: General;  Laterality: Right;  Provider requesting 2 hours/120 minutes for procedure.   Neuroendoscopy  05/28/2022   With excision pituitary tumor transnasal or transphenoidal approach. Resection of craniopharyngioma.   PELVIC LYMPH NODE DISSECTION N/A 07/15/2016   Procedure: PELVIC LYMPH NODE DISSECTION;  Surgeon: Nickie Retort, MD;  Location: [facility] ORS;  Service: Urology;  Laterality: N/A;   ROBOT ASSISTED LAPAROSCOPIC  RADICAL PROSTATECTOMY N/A 07/15/2016   Procedure: ROBOTIC ASSISTED LAPAROSCOPIC RADICAL PROSTATECTOMY;  Surgeon: Nickie Retort, MD;  Location: [facility] ORS;  Service: Urology;  Laterality: N/A;    Allergies: Patient has no known allergies.  Medications: Prior to Admission medications   Medication Sig Start Date End Date Taking? Authorizing Provider  aspirin 81 MG tablet Take 81 mg by mouth daily.    [provider]  brimonidine-timolol (COMBIGAN) 0.2-0.5 % ophthalmic solution Place 1 drop into both eyes every 12 (twelve) hours.    [provider]  Cholecalciferol (VITAMIN D) 2000 units CAPS Take 1 capsule (2,000 Units total) by mouth daily. 10/03/15   [name], MD  fluticasone (FLONASE) 50 MCG/ACT nasal spray Place 2 sprays into both nostrils daily. 11/11/21   Teodora Medici, DO  latanoprost (XALATAN) 0.005 % ophthalmic solution Place 1 drop into both eyes at bedtime.     Birder Robson, MD  lisinopril (ZESTRIL) 10 MG tablet Take 1 tablet (10 mg total) by mouth daily. Patient not taking: Reported on 08/10/2022 06/16/22   [name], MD  rosuvastatin (CRESTOR) 40 MG tablet Take 1 tablet (40 mg total) by mouth daily. In place of Atorvastatin 12/30/21   [name], MD  sildenafil (VIAGRA) 100 MG tablet Take 1 tablet (100 mg total) by mouth daily as needed for erectile dysfunction. 09/24/21   Billey Co, MD  thiamine (VITAMIN B1) 100 MG tablet Take 100 mg by mouth daily. 06/10/22 06/10/23  [provider]  tiZANidine (ZANAFLEX) 2 MG tablet Take 1-2 tablets (2-4 mg  total) by mouth every 6 (six) hours as needed for muscle spasms. Patient not taking: Reported on 08/10/2022 12/30/21   [name], MD  triamcinolone cream (KENALOG) 0.1 % APPLY TOPICALLY AS DIRECTED TWICE DAILY 12/12/21   Bo Merino, FNP     Family History  Problem Relation Age of Onset   Heart disease Mother    Heart attack Mother    Diabetes Father    Heart attack Father     Dementia Father    Cancer Sister        Breast   Cancer Brother        Prostate   Healthy Sister    Stroke Brother     Social History   Socioeconomic History   Marital status: Widowed    Spouse name: Not on file   Number of children: 0   Years of education: Not on file   Highest education level: 12th grade  Occupational History   Occupation: custodian     Comment: part time   Tobacco Use   Smoking status: Former    Packs/day: 0.50    Years: 40.00    Total pack years: 20.00    Types: Cigarettes    Quit date: 03/23/2016    Years since quitting: 6.4   Smokeless tobacco: Never  Vaping Use   Vaping Use: Never used  Substance and Sexual Activity   Alcohol use: Yes    Alcohol/week: 2.0 - 4.0 standard drinks of alcohol    Types: 2 - 4 Standard drinks or equivalent per week    Comment: 1-2 times per week   Drug use: No   Sexual activity: Yes    Birth control/protection: Condom  Other Topics Concern   Not on file  Social History Narrative   Patient lives alone.    He will be staying with his sister after surgery.   Social Determinants of Health   Financial Resource Strain: Medium Risk (03/03/2022)   Overall Financial Resource Strain (CARDIA)    Difficulty of Paying Living Expenses: Somewhat hard  Food Insecurity: Food Insecurity Present (08/10/2022)   Hunger Vital Sign    Worried About Running Out of Food in the Last Year: Sometimes true    Ran Out of Food in the Last Year: Sometimes true  Transportation Needs: No Transportation Needs (08/10/2022)   PRAPARE - Hydrologist (Medical): No    Lack of Transportation (Non-Medical): No  Physical Activity: Insufficiently Active (03/03/2022)   Exercise Vital Sign    Days of Exercise per Week: 1 day    Minutes of Exercise per Session: 70 min  Stress: Stress Concern Present (03/03/2022)   [place]    Feeling of Stress : Rather much   Social Connections: Socially Isolated (03/03/2022)   Social Connection and Isolation Panel [NHANES]    Frequency of Communication with Friends and Family: Once a week    Frequency of Social Gatherings with Friends and Family: Once a week    Attends Religious Services: Never    Marine scientist or Organizations: No    Attends Archivist Meetings: Never    Marital Status: Widowed    Review of Systems: A 12 point ROS discussed and pertinent positives are indicated in the HPI above.  All other systems are negative.  Review of Systems  Vital Signs: There were no vitals taken for this visit.  Advance Care Plan: {Advance Care YBOF:75102}  Physical Exam  Imaging: No results found.  Labs:  CBC: Recent Labs    08/10/22 1214  WBC 2.9*  HGB 12.8*  HCT 38.1*  PLT 309    COAGS: No results for input(s): "INR", "APTT" in the last 8760 hours.  BMP: Recent Labs    07/01/22 0944 07/08/22 1043 07/15/22 1121 08/10/22 1214  NA 147* 146 140 138  K 4.8 5.2 4.5 4.3  CL 108 106 103 102  CO2 31 33* 31 28  GLUCOSE 87 123* 164* 87  BUN 15 21 32* 15  CALCIUM 10.3 10.9* 9.3 9.9  CREATININE 1.88* 2.47* 3.32* 1.70*  GFRNONAA  --   --   --  43*    LIVER FUNCTION TESTS: Recent Labs    12/30/21 0947 08/10/22 1214  BILITOT 1.1 0.7  AST 14 18  ALT 10 15  ALKPHOS  --  66  PROT 7.1 8.1  ALBUMIN  --  4.6    TUMOR MARKERS: No results for input(s): "AFPTM", "CEA", "CA199", "CHROMGRNA" in the last 8760 hours.  Assessment and Plan:  72 yo male presents to IR for bone marrow biopsy after abnormal labs are concerning for possible lambda chain myeloma.   Risks and benefits of bone marrow biopsy and aspiration with moderate sedation was discussed with the patient and/or patient's family including, but not limited to bleeding, infection, damage to adjacent structures or low yield requiring additional tests.  All of the questions were answered and there is agreement to  proceed.  Consent signed and in chart.  Thank you for this interesting consult.  I greatly enjoyed meeting Ernest Sweeney and look forward to participating in their care.  A copy of this report was sent to the requesting provider on this date.  Electronically Signed: Tyson Alias, NP 08/17/2022, 4:16 PM   I spent a total of {New DPOE:[id number]} {New Out-Pt:[id number]}  {Established Out-Pt:[id number]} in face to face in clinical consultation, greater than 50% of which was counseling/coordinating care for leukopenia.

## 2022-08-18 ENCOUNTER — Other Ambulatory Visit: Payer: Self-pay | Admitting: Radiology

## 2022-08-18 DIAGNOSIS — D708 Other neutropenia: Secondary | ICD-10-CM

## 2022-08-18 NOTE — Progress Notes (Signed)
Patient for Bone Marrow Biopsy on Wed 08/19/2022, I called and spoke with the patient on the  phone and gave pre-procedure instructions. Pt was made aware to be here at 7:30a at the new entrance, NPO after MN prior to procedure as well as driver post procedure/recovery/discharge. Pt stated understanding.  Called 08/18/2022

## 2022-08-19 ENCOUNTER — Ambulatory Visit
Admission: RE | Admit: 2022-08-19 | Discharge: 2022-08-19 | Disposition: A | Discharge status: Home / Self Care | Payer: Medicare HMO | Source: Ambulatory Visit | Attending: Oncology | Admitting: Oncology

## 2022-08-19 DIAGNOSIS — D649 Anemia, unspecified: Secondary | ICD-10-CM | POA: Insufficient documentation

## 2022-08-19 DIAGNOSIS — D808 Other immunodeficiencies with predominantly antibody defects: Secondary | ICD-10-CM | POA: Diagnosis present

## 2022-08-19 DIAGNOSIS — D72819 Decreased white blood cell count, unspecified: Secondary | ICD-10-CM | POA: Diagnosis not present

## 2022-08-19 DIAGNOSIS — Z1379 Encounter for other screening for genetic and chromosomal anomalies: Secondary | ICD-10-CM | POA: Insufficient documentation

## 2022-08-19 DIAGNOSIS — D72822 Plasmacytosis: Secondary | ICD-10-CM | POA: Diagnosis not present

## 2022-08-19 DIAGNOSIS — D709 Neutropenia, unspecified: Secondary | ICD-10-CM | POA: Diagnosis not present

## 2022-08-19 DIAGNOSIS — D6489 Other specified anemias: Secondary | ICD-10-CM | POA: Diagnosis not present

## 2022-08-19 DIAGNOSIS — D708 Other neutropenia: Secondary | ICD-10-CM

## 2022-08-19 LAB — CBC WITH DIFFERENTIAL/PLATELET
Abs Immature Granulocytes: 0.01 10*3/uL (ref 0.00–0.07)
Basophils Absolute: 0 10*3/uL (ref 0.0–0.1)
Basophils Relative: 1 %
Eosinophils Absolute: 0.1 10*3/uL (ref 0.0–0.5)
Eosinophils Relative: 3 %
HCT: 35.8 % — ABNORMAL LOW (ref 39.0–52.0)
Hemoglobin: 12 g/dL — ABNORMAL LOW (ref 13.0–17.0)
Immature Granulocytes: 0 %
Lymphocytes Relative: 35 %
Lymphs Abs: 1.1 10*3/uL (ref 0.7–4.0)
MCH: 31.7 pg (ref 26.0–34.0)
MCHC: 33.5 g/dL (ref 30.0–36.0)
MCV: 94.7 fL (ref 80.0–100.0)
Monocytes Absolute: 0.4 10*3/uL (ref 0.1–1.0)
Monocytes Relative: 12 %
Neutro Abs: 1.5 10*3/uL — ABNORMAL LOW (ref 1.7–7.7)
Neutrophils Relative %: 49 %
Platelets: 328 10*3/uL (ref 150–400)
RBC: 3.78 MIL/uL — ABNORMAL LOW (ref 4.22–5.81)
RDW: 13 % (ref 11.5–15.5)
WBC: 3.1 10*3/uL — ABNORMAL LOW (ref 4.0–10.5)
nRBC: 0 % (ref 0.0–0.2)

## 2022-08-19 MED ORDER — MIDAZOLAM HCL 2 MG/2ML IJ SOLN
INTRAMUSCULAR | Status: AC
  Filled 2022-08-19: qty 4

## 2022-08-19 MED ORDER — MIDAZOLAM HCL 2 MG/2ML IJ SOLN
INTRAMUSCULAR | Status: AC | PRN
  Administered 2022-08-19: 1 mg via INTRAVENOUS

## 2022-08-19 MED ORDER — SODIUM CHLORIDE 0.9 % IV SOLN: INTRAVENOUS | Status: DC

## 2022-08-19 MED ORDER — LIDOCAINE HCL 1 % IJ SOLN
INTRAMUSCULAR | Status: AC | PRN
  Administered 2022-08-19: 5 mL

## 2022-08-19 MED ORDER — HEPARIN SOD (PORK) LOCK FLUSH 100 UNIT/ML IV SOLN
INTRAVENOUS | Status: AC
  Administered 2022-08-19: 500 [IU]
  Filled 2022-08-19: qty 5

## 2022-08-19 MED ORDER — FENTANYL CITRATE (PF) 100 MCG/2ML IJ SOLN
INTRAMUSCULAR | Status: AC
  Filled 2022-08-19: qty 2

## 2022-08-19 MED ORDER — FENTANYL CITRATE (PF) 100 MCG/2ML IJ SOLN
INTRAMUSCULAR | Status: AC | PRN
  Administered 2022-08-19: 50 ug via INTRAVENOUS

## 2022-08-19 MED ORDER — MIDAZOLAM HCL 2 MG/2ML IJ SOLN
INTRAMUSCULAR | Status: AC
  Filled 2022-08-19: qty 2

## 2022-08-19 NOTE — Procedures (Signed)
Interventional Radiology Procedure Note  Procedure: CT guided aspirate and core biopsy of right iliac bone Complications: None Recommendations: - Bedrest supine x 1 hrs - Hydrocodone PRN  Pain - Follow biopsy results  Signed,  Tenasia Aull K. Cheskel Silverio, MD   

## 2022-08-19 NOTE — Progress Notes (Signed)
Pt drinking ginger ale and eating PB crackers

## 2022-08-21 LAB — SURGICAL PATHOLOGY

## 2022-08-26 ENCOUNTER — Inpatient Hospital Stay: Payer: Medicare HMO | Attending: Oncology | Admitting: Oncology

## 2022-08-26 ENCOUNTER — Encounter: Payer: Self-pay | Admitting: Oncology

## 2022-08-26 VITALS — BP 125/107 | HR 76 | Temp 96.1°F | Resp 16 | Ht 69.0 in | Wt 172.0 lb

## 2022-08-26 DIAGNOSIS — D808 Other immunodeficiencies with predominantly antibody defects: Secondary | ICD-10-CM | POA: Diagnosis not present

## 2022-08-26 DIAGNOSIS — D72819 Decreased white blood cell count, unspecified: Secondary | ICD-10-CM | POA: Insufficient documentation

## 2022-08-26 DIAGNOSIS — C61 Malignant neoplasm of prostate: Secondary | ICD-10-CM | POA: Insufficient documentation

## 2022-08-26 DIAGNOSIS — Z79899 Other long term (current) drug therapy: Secondary | ICD-10-CM | POA: Diagnosis not present

## 2022-08-26 DIAGNOSIS — N179 Acute kidney failure, unspecified: Secondary | ICD-10-CM | POA: Diagnosis not present

## 2022-08-26 DIAGNOSIS — D472 Monoclonal gammopathy: Secondary | ICD-10-CM | POA: Diagnosis not present

## 2022-08-26 NOTE — Progress Notes (Addendum)
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  INTERVAL HISTORY: Patient returns to clinic today for further evaluation and discussion of his bone marrow biopsy results.  He continues to feel well and remains asymptomatic.  He has no neurologic complaints. He denies any recent fevers or illnesses.  He has a good appetite and denies weight loss.  He has no chest pain, shortness of breath, cough, or hemoptysis.  He denies any nausea, vomiting, constipation, or diarrhea.  He has no urinary complaints.  Patient offers no further specific complaints today.  REVIEW OF SYSTEMS:   Review of Systems  Constitutional: Negative.  Negative for fever, malaise/fatigue and weight loss.  Eyes:  Negative for blurred vision and double vision.  Respiratory: Negative.  Negative for cough, hemoptysis and shortness of breath.   Cardiovascular: Negative.  Negative for chest pain and leg swelling.  Gastrointestinal: Negative.  Negative for abdominal pain.  Genitourinary: Negative.  Negative for dysuria.  Musculoskeletal: Negative.  Negative for back pain.  Skin: Negative.  Negative for rash.  Neurological: Negative.  Negative for dizziness, focal weakness, weakness and headaches.  Psychiatric/Behavioral: Negative.  The patient is not nervous/anxious.     As per HPI. Otherwise, a complete review of systems is negative.  PAST MEDICAL HISTORY: Past Medical History:  Diagnosis Date   Anxiety    Panic attacks in the past   Arthritis    Benign  prostatic hypertrophy without lower urinary tract symptoms    Cancer (HCC)    Prostate. monitoring now since surgery   Colon polyp    GERD (gastroesophageal reflux disease)    Glaucoma (increased eye pressure)    Left eye only   Hyperlipidemia    Hypertension     PAST SURGICAL HISTORY: Past Surgical History:  Procedure Laterality Date   COLONOSCOPY  07/13/2008   Dr Sula Rumple   COLONOSCOPY WITH PROPOFOL N/A 01/05/2020   Procedure: COLONOSCOPY WITH PROPOFOL;  Surgeon: [name], MD;  Location: [facility] ENDOSCOPY;  Service: Gastroenterology;  Laterality: N/A;   excision skin cyst  06/19/2014   Dr. Festus Aloe CYST   INGUINAL HERNIA REPAIR Right 09/06/2020   Procedure: HERNIA REPAIR RIGH  INGUINAL WITH MESH ADULT, open with RNFA to assist;  Surgeon: Fredirick Maudlin, MD;  Location: [facility] ORS;  Service: General;  Laterality: Right;  Provider requesting 2 hours/120 minutes for procedure.   Neuroendoscopy  05/28/2022   With excision pituitary tumor transnasal or transphenoidal approach. Resection of craniopharyngioma.   PELVIC LYMPH NODE DISSECTION N/A 07/15/2016   Procedure: PELVIC LYMPH NODE DISSECTION;  Surgeon: Nickie Retort, MD;  Location: [facility] ORS;  Service: Urology;  Laterality: N/A;   ROBOT ASSISTED LAPAROSCOPIC RADICAL PROSTATECTOMY N/A 07/15/2016   Procedure: ROBOTIC ASSISTED LAPAROSCOPIC RADICAL PROSTATECTOMY;  Surgeon: Nickie Retort, MD;  Location: [facility] ORS;  Service: Urology;  Laterality: N/A;    FAMILY HISTORY: Family History  Problem Relation Age of Onset   Heart disease Mother    Heart attack Mother    Diabetes Father    Heart attack Father    Dementia Father    Cancer Sister  Breast   Cancer Brother        Prostate   Healthy Sister    Stroke Brother     ADVANCED DIRECTIVES (Y/N):  N  HEALTH MAINTENANCE: Social History   Tobacco Use   Smoking status: Former    Packs/day: 0.50    Years: 40.00    Total pack years: 20.00    Types:  Cigarettes    Quit date: 03/23/2016    Years since quitting: 6.4   Smokeless tobacco: Never  Vaping Use   Vaping Use: Never used  Substance Use Topics   Alcohol use: Yes    Alcohol/week: 2.0 - 4.0 standard drinks of alcohol    Types: 2 - 4 Standard drinks or equivalent per week    Comment: 1-2 times per week   Drug use: No     Colonoscopy:  PAP:  Bone density:  Lipid panel:  No Known Allergies  Current Outpatient Medications  Medication Sig Dispense Refill   aspirin 81 MG tablet Take 81 mg by mouth daily.     brimonidine-timolol (COMBIGAN) 0.2-0.5 % ophthalmic solution Place 1 drop into both eyes every 12 (twelve) hours.     Cholecalciferol (VITAMIN D) 2000 units CAPS Take 1 capsule (2,000 Units total) by mouth daily. 30 capsule 0   fluticasone (FLONASE) 50 MCG/ACT nasal spray Place 2 sprays into both nostrils daily. 16 g 6   latanoprost (XALATAN) 0.005 % ophthalmic solution Place 1 drop into both eyes at bedtime.      lisinopril (ZESTRIL) 10 MG tablet Take 1 tablet (10 mg total) by mouth daily. 90 tablet 0   rosuvastatin (CRESTOR) 40 MG tablet Take 1 tablet (40 mg total) by mouth daily. In place of Atorvastatin 90 tablet 1   thiamine (VITAMIN B1) 100 MG tablet Take 100 mg by mouth daily.     sildenafil (VIAGRA) 100 MG tablet Take 1 tablet (100 mg total) by mouth daily as needed for erectile dysfunction. (Patient not taking: Reported on 08/19/2022) 30 tablet 6   tiZANidine (ZANAFLEX) 2 MG tablet Take 1-2 tablets (2-4 mg total) by mouth every 6 (six) hours as needed for muscle spasms. (Patient not taking: Reported on 08/10/2022) 60 tablet 0   triamcinolone cream (KENALOG) 0.1 % APPLY TOPICALLY AS DIRECTED TWICE DAILY (Patient not taking: Reported on 08/19/2022) 45 g 0   No current facility-administered medications for this visit.    OBJECTIVE: Vitals:   08/26/22 1030  BP: (!) 125/107  Pulse: 76  Resp: 16  Temp: (!) 96.1 F (35.6 C)  SpO2: 100%     Body mass index is 25.4  kg/m.    ECOG FS:0 - Asymptomatic  General: Well-developed, well-nourished, no acute distress. Eyes: Pink conjunctiva, anicteric sclera. HEENT: Normocephalic, moist mucous membranes. Lungs: No audible wheezing or coughing. Heart: Regular rate and rhythm. Abdomen: Soft, nontender, no obvious distention. Musculoskeletal: No edema, cyanosis, or clubbing. Neuro: Alert, answering all questions appropriately. Cranial nerves grossly intact. Skin: No rashes or petechiae noted. Psych: Normal affect.  LAB RESULTS:  Lab Results  Component Value Date   NA 138 08/10/2022   K 4.3 08/10/2022   CL 102 08/10/2022   CO2 28 08/10/2022   GLUCOSE 87 08/10/2022   BUN 15 08/10/2022   CREATININE 1.70 (H) 08/10/2022   CALCIUM 9.9 08/10/2022   PROT 8.1 08/10/2022   ALBUMIN 4.6 08/10/2022   AST 18 08/10/2022   ALT 15 08/10/2022   ALKPHOS 66 08/10/2022   BILITOT 0.7 08/10/2022   GFRNONAA  43 (L) 08/10/2022   GFRAA 74 05/02/2020    Lab Results  Component Value Date   WBC 3.1 (L) 08/19/2022   NEUTROABS 1.5 (L) 08/19/2022   HGB 12.0 (L) 08/19/2022   HCT 35.8 (L) 08/19/2022   MCV 94.7 08/19/2022   PLT 328 08/19/2022     STUDIES: CT BONE MARROW BIOPSY & ASPIRATION  Result Date: 08/19/2022 INDICATION: 72 year old male with leukopenia and possible lambda chain myeloma. EXAM: CT GUIDED BONE MARROW ASPIRATION AND CORE BIOPSY Interventional Radiologist:  Criselda Peaches, MD MEDICATIONS: None. ANESTHESIA/SEDATION: Moderate (conscious) sedation was employed during this procedure. A total of 1 milligrams versed and 50 micrograms fentanyl were administered intravenously. The patient's level of consciousness and vital signs were monitored continuously by radiology nursing throughout the procedure under my direct supervision. Total monitored sedation time: 10 minutes FLUOROSCOPY: None. COMPLICATIONS: None immediate. Estimated blood loss: <25 mL PROCEDURE: Informed written consent was obtained from the  patient after a thorough discussion of the procedural risks, benefits and alternatives. All questions were addressed. Maximal Sterile Barrier Technique was utilized including caps, mask, sterile gowns, sterile gloves, sterile drape, hand hygiene and skin antiseptic. A timeout was performed prior to the initiation of the procedure. The patient was positioned prone and non-contrast localization CT was performed of the pelvis to demonstrate the iliac marrow spaces. Maximal barrier sterile technique utilized including caps, mask, sterile gowns, sterile gloves, large sterile drape, hand hygiene, and betadine prep. Under sterile conditions and local anesthesia, an 11 gauge coaxial bone biopsy needle was advanced into the right iliac marrow space. Needle position was confirmed with CT imaging. Initially, bone marrow aspiration was performed. Next, the 11 gauge outer cannula was utilized to obtain a right iliac bone marrow core biopsy. Needle was removed. Hemostasis was obtained with compression. The patient tolerated the procedure well. Samples were prepared with the cytotechnologist. IMPRESSION: Successful CT-guided bone marrow aspiration and core biopsy of the right iliac bone. Electronically Signed   By: Jacqulynn Cadet M.D.   On: 08/19/2022 15:26    ASSESSMENT: Smoldering lambda chain myeloma with q13-.  PLAN:    Smoldering lambda chain myeloma: Bone marrow biopsy completed on August 19, 2022 revealed approximately 25% plasma cells.  Cytogenetics reviewed q13 - mutation.  Patient only has an M spike of 0.3, but his lambda light chains are elevated at 267.  His kidney function is improving and he only has a mild leukopenia.  He has no other evidence of endorgan disease.  Case discussed with nephrology, and no treatment is needed at this time.  Patient expressed understanding that he may require treatment in the future.  Return to clinic in 3 months with repeat laboratory work and further evaluation.  Acute  renal failure: Patient's most recent creatinine has improved to 1.7.  Continue follow-up with nephrology as indicated.  Bone marrow biopsy as above.   Leukopenia: Mild, monitor.  Bone marrow biopsy results as above. Pituitary tumor: Patient underwent surgical resection at [facility] in November 2023.  Continue follow-up and imaging with MRI with [facility] neurosurgery. Prostate cancer: Patient underwent prostatectomy followed by XRT for local control.  PSA is monitored by primary care.   I spent a total of 30 minutes reviewing chart data, face-to-face evaluation with the patient, counseling and coordination of care as detailed above.   Patient expressed understanding and was in agreement with this plan. He also understands that He can call clinic at any time with any questions, concerns, or complaints.  Cancer Staging  No matching staging information was found for the patient.  Lloyd Huger, MD   08/26/2022 10:46 AM

## 2022-08-28 ENCOUNTER — Encounter (HOSPITAL_COMMUNITY): Payer: Self-pay | Admitting: Oncology

## 2022-08-31 ENCOUNTER — Encounter (HOSPITAL_COMMUNITY): Payer: Self-pay | Admitting: Oncology

## 2022-09-07 ENCOUNTER — Telehealth: Payer: Self-pay

## 2022-09-07 ENCOUNTER — Telehealth: Payer: Self-pay | Admitting: *Deleted

## 2022-09-07 DIAGNOSIS — I2510 Atherosclerotic heart disease of native coronary artery without angina pectoris: Secondary | ICD-10-CM

## 2022-09-07 DIAGNOSIS — I10 Essential (primary) hypertension: Secondary | ICD-10-CM

## 2022-09-07 NOTE — Patient Outreach (Signed)
Received a referral from [employer]. I have sent a referral to the [place] Team to assign a Nurse Case Manager.     [facility], [place], [place] Management [phone]

## 2022-09-07 NOTE — Telephone Encounter (Signed)
   Telephone encounter was:  Successful.  09/07/2022 Name: Ernest Sweeney MRN: [id number] DOB: 12/02/1950  Ernest Sweeney is a 72 y.o. year old male who is a primary care patient of [name], MD . The community resource team was consulted for assistance with [place] guide performed the following interventions: Patient provided with information about care guide support team and interviewed to confirm resource needs. Patient is calling due to food insecurity and previously assisted with food through [facility] no longer able to do so I will put in an [facility] referal and also for MOW and also provided Moms meals , patient with alot of health issues not seeing followed by case management will send referral .  Follow Up Plan:  No further follow up planned at this time. The patient has been provided with needed resources.  [facility] [phone] [address] [place] , [place] [address] Email : [email or web address]

## 2022-09-08 ENCOUNTER — Telehealth: Payer: Self-pay | Admitting: *Deleted

## 2022-09-08 NOTE — Progress Notes (Signed)
  Care Coordination   Note   09/08/2022 Name: Ernest Sweeney MRN: [id number] DOB: April 29, 1951  Ernest Sweeney is a 72 y.o. year old male who sees [name], MD for primary care. I reached out to [name] by phone today to offer care coordination services.  [name] was given information about Care Coordination services today including:   The Care Coordination services include support from the care team which includes your Nurse Coordinator, Clinical Social Worker, or Pharmacist.  The Care Coordination team is here to help remove barriers to the health concerns and goals most important to you. Care Coordination services are voluntary, and the patient may decline or stop services at any time by request to their care team member.   Care Coordination Consent Status: Patient agreed to services and verbal consent obtained.   Follow up plan:  Telephone appointment with care coordination team member scheduled for:  09/18/2022  Encounter Outcome:  Pt. Scheduled from referral   Julian Hy, [place] Direct Dial: [phone]

## 2022-09-09 LAB — SURGICAL PATHOLOGY

## 2022-09-18 ENCOUNTER — Ambulatory Visit: Payer: Self-pay | Admitting: *Deleted

## 2022-09-18 ENCOUNTER — Encounter: Payer: Self-pay | Admitting: *Deleted

## 2022-09-18 DIAGNOSIS — D497 Neoplasm of unspecified behavior of endocrine glands and other parts of nervous system: Secondary | ICD-10-CM | POA: Diagnosis not present

## 2022-09-18 NOTE — Patient Instructions (Signed)
Visit Information  Thank you for taking time to visit with me today. Please don't hesitate to contact me if I can be of assistance to you before our next scheduled telephone appointment.  Following are the goals we discussed today:  Monitor blood pressure daily Eat heart healthy diet  Our next appointment is by telephone on 4/5  Please call the care guide team at [phone] if you need to cancel or reschedule your appointment.   Please call the Suicide and Crisis Lifeline: 988 call the [place] National Suicide Prevention Lifeline: (640) 765-0527 or TTY: 3363851179 TTY [phone]) to talk to a trained counselor call 1-800-273-TALK (toll free, 24 hour hotline) call 911 if you are experiencing a Mental Health or [place] or need someone to talk to.  Patient verbalizes understanding of instructions and care plan provided today and agrees to view in [facility]. Active MyChart status and patient understanding of how to access instructions and care plan via MyChart confirmed with patient.     The patient has been provided with contact information for the care management team and has been advised to call with any health related questions or concerns.   Valente David, RN, MSN, [facility] Management Care Management Coordinator [phone]

## 2022-09-18 NOTE — Patient Outreach (Signed)
  Care Coordination   Initial Visit Note   09/18/2022 Name: Ernest Sweeney MRN: [id number] DOB: [birth year]  Ernest Sweeney is a 72 y.o. year old male who sees [name], MD for primary care. I spoke with  [name] by phone today.  What matters to the patients health and wellness today?  Staying healthy and busy.      Goals Addressed             This Visit's Progress    Management of chronic medical conditions by eating right and exercising       Care Coordination Interventions: Evaluation of current treatment plan related to hypertension self management and patient's adherence to plan as established by provider Reviewed medications with patient and discussed importance of compliance Provided assistance with obtaining home blood pressure monitor via [facility]; Reviewed scheduled/upcoming provider appointments including:  Discussed complications of poorly controlled blood pressure such as heart disease, stroke, circulatory complications, vision complications, kidney impairment, sexual dysfunction Screening for signs and symptoms of depression related to chronic disease state  Assessed social determinant of health barriers Upcoming appointments: PCP on 3/20, urology on 3/27, radiology oncology on 4/4, ad nephrology on 4/18 Discussed proper diet, low salt, heart healthy        SDOH assessments and interventions completed:  Yes  SDOH Interventions Today    Flowsheet Row Most Recent Value  SDOH Interventions   Housing Interventions Other (Comment)  Transportation Interventions Intervention Not Indicated        Care Coordination Interventions:  Yes, provided   Interventions Today    Flowsheet Row Most Recent Value  Chronic Disease   Chronic disease during today's visit Hypertension (HTN)  General Interventions   General Interventions Discussed/Reviewed General Interventions Reviewed, Doctor Visits  Doctor Visits Discussed/Reviewed Doctor Visits Reviewed, PCP   PCP/Specialist Visits Compliance with follow-up visit  Exercise Interventions   Exercise Discussed/Reviewed Exercise Reviewed  Education Interventions   Education Provided Provided Education  Provided Verbal Education On When to see the doctor, Nutrition, Medication, Exercise  Nutrition Interventions   Nutrition Discussed/Reviewed Decreasing salt, Nutrition Reviewed  Pharmacy Interventions   Pharmacy Dicussed/Reviewed Affording Medications        Follow up plan: Follow up call scheduled for 4/5    Encounter Outcome:  Pt. Visit Completed   [facility] Care Management Coordinator [phone]

## 2022-09-21 ENCOUNTER — Other Ambulatory Visit: Payer: Self-pay | Admitting: Family Medicine

## 2022-09-21 DIAGNOSIS — I700 Atherosclerosis of aorta: Secondary | ICD-10-CM

## 2022-09-29 NOTE — Progress Notes (Unsigned)
Name: Ernest Sweeney   MRN: [id number]    DOB: 08/23/1950   Date:09/30/2022       Progress Note  Subjective  Chief Complaint  Follow Up  HPI  History of craniopharyngioma resection: admitted 11/16 and discharged 11/18 , he had to be re-intubated two hours after procedure due to bleeding and was admitted to ICU, he got agitated for possible alcohol withdrawal, he had increase in urinary output and was placed on DDAVP, he also had pos surgical hypopituitarism and had to take corticosteroids and also vasopressin , however off medications and last visit with Endo he was released from their care  CKI stage IV due to lambda chain myeloma : under the care of hematologist and also nephrologist, per Dr. Candiss Norse advised to stay off lisinopril, however patients states bp was spiking at 160's and he is back on medications, we will switch to norvasc 2.5 mg and return in one week for bp check only. No chest pain or palpitation, he has polyuria.    Major Depression: seen January 2020 and phq 9 was very high at 1, we started him on Zofot 50 mg but he stopped Zofot because he read the side effects, he has been off medication for a while now and pqh 9 is positive  He states stable and does not want to go back on medication    Alcohol use disorder: he always drank, used to beer when he was younger, but since his mid 20's he started to drink liquor, since his 74's he has been drinking heavier, mostly on weekends and worse since he stopped drinking last fall after brain surgery, but he is drinking again on weekends again, about a half  a pint, explained importance of at least cutting down on amount   HTN: he needs to stop lisinopril due to low kidney function we will try norvasc , no chest pain   Chronic bronchitis/Emphysema : he used Breo in the past but he stopped medication on his own, quit smoking in 2016 he is doing well, no cough, wheezing  or SOB. Stable    Hyperlipidemia/Atherosclerosis of Aorta/CAD: taking  aspirin and rosuvastatin. LDL at goal, continue crestor   History of prostate cancer dx 2016 : last PSA was normal seeing Dr. Baruch Gouty every 12  months now. He denies urinary urgency and nocturia has resolved . Unchanged   Patient Active Problem List   Diagnosis Date Noted   Other neutropenia ([place]) 12/30/2021   Intermittent low back pain 12/30/2021   Essential hypertension 12/30/2021   Left inguinal hernia 12/30/2021   History of right inguinal hernia repair    Alcoholism ([place]) 06/28/2020   Major depression in remission ([place]) 06/28/2020   Lung nodules 09/04/2018   Chronic bronchitis ([place]) 04/23/2017   Atherosclerosis of aorta ([place]) 04/23/2017   Coronary artery disease due to calcified coronary lesion 04/23/2017   Glaucoma, left eye 08/04/2016   History of prostate cancer 07/15/2016   History of prostatectomy 07/15/2016   Vitamin D deficiency 05/31/2015   Dyslipidemia 05/31/2015   Seasonal allergic rhinitis 05/31/2015   Chronic pain of both shoulders 05/31/2015   Family history of malignant neoplasm of prostate 07/26/2012   Hematuria, microscopic 07/26/2012    Past Surgical History:  Procedure Laterality Date   COLONOSCOPY  07/13/2008   Dr Sula Rumple   COLONOSCOPY WITH PROPOFOL N/A 01/05/2020   Procedure: COLONOSCOPY WITH PROPOFOL;  Surgeon: [name], MD;  [facility] END[facility];  Service: Gastroenterology;  Laterality: N/A;   excision skin  cyst  06/19/2014   Dr. Festus Aloe CYST   INGUINAL HERNIA REPAIR Right 09/06/2020   Procedure: HERNIA REPAIR RIGH  INGUINAL WITH MESH ADULT, open with RNFA to assist;  Surgeon: Fredirick Maudlin, MD;  Location: [facility] ORS;  Service: General;  Laterality: Right;  Provider requesting 2 hours/120 minutes for procedure.   Neuroendoscopy  05/28/2022   With excision pituitary tumor transnasal or transphenoidal approach. Resection of craniopharyngioma.   PELVIC LYMPH NODE DISSECTION N/A 07/15/2016   Procedure: PELVIC LYMPH NODE DISSECTION;   Surgeon: Nickie Retort, MD;  Location: [facility] ORS;  Service: Urology;  Laterality: N/A;   ROBOT ASSISTED LAPAROSCOPIC RADICAL PROSTATECTOMY N/A 07/15/2016   Procedure: ROBOTIC ASSISTED LAPAROSCOPIC RADICAL PROSTATECTOMY;  Surgeon: Nickie Retort, MD;  Location: [facility] ORS;  Service: Urology;  Laterality: N/A;    Family History  Problem Relation Age of Onset   Heart disease Mother    Heart attack Mother    Diabetes Father    Heart attack Father    Dementia Father    Cancer Sister        Breast   Cancer Brother        Prostate   Healthy Sister    Stroke Brother     Social History   Tobacco Use   Smoking status: Former    Packs/day: 0.50    Years: 40.00    Additional pack years: 0.00    Total pack years: 20.00    Types: Cigarettes    Quit date: 03/23/2016    Years since quitting: 6.5   Smokeless tobacco: Never  Substance Use Topics   Alcohol use: Yes    Alcohol/week: 2.0 - 4.0 standard drinks of alcohol    Types: 2 - 4 Standard drinks or equivalent per week    Comment: 1-2 times per week     Current Outpatient Medications:    aspirin 81 MG tablet, Take 81 mg by mouth daily., Disp: , Rfl:    brimonidine-timolol (COMBIGAN) 0.2-0.5 % ophthalmic solution, Place 1 drop into both eyes every 12 (twelve) hours., Disp: , Rfl:    Cholecalciferol (VITAMIN D) 2000 units CAPS, Take 1 capsule (2,000 Units total) by mouth daily., Disp: 30 capsule, Rfl: 0   fluticasone (FLONASE) 50 MCG/ACT nasal spray, Place 2 sprays into both nostrils daily., Disp: 16 g, Rfl: 6   latanoprost (XALATAN) 0.005 % ophthalmic solution, Place 1 drop into both eyes at bedtime. , Disp: , Rfl:    lisinopril (ZESTRIL) 10 MG tablet, Take 1 tablet (10 mg total) by mouth daily., Disp: 90 tablet, Rfl: 0   rosuvastatin (CRESTOR) 40 MG tablet, TAKE 1 TABLET BY MOUTH ONCE DAILY IN  PLACE  OF  ATORVASTATIN, Disp: 90 tablet, Rfl: 0   sildenafil (VIAGRA) 100 MG tablet, Take 1 tablet (100 mg total) by mouth daily as  needed for erectile dysfunction., Disp: 30 tablet, Rfl: 6   thiamine (VITAMIN B1) 100 MG tablet, Take 100 mg by mouth daily., Disp: , Rfl:    tiZANidine (ZANAFLEX) 2 MG tablet, Take 1-2 tablets (2-4 mg total) by mouth every 6 (six) hours as needed for muscle spasms., Disp: 60 tablet, Rfl: 0   triamcinolone cream (KENALOG) 0.1 %, APPLY TOPICALLY AS DIRECTED TWICE DAILY, Disp: 45 g, Rfl: 0  No Known Allergies  I personally reviewed active problem list, medication list, allergies, family history, social history, health maintenance with the patient/caregiver today.   ROS  Constitutional: Negative for fever , positive for weight change.  Respiratory: positive  for intermittent cough but no  shortness of breath.   Cardiovascular: Negative for chest pain or palpitations.  Gastrointestinal: Negative for abdominal pain, no bowel changes.  Musculoskeletal: Negative for gait problem or joint swelling.  Skin: Negative for rash.  Neurological: Negative for dizziness or headache.  No other specific complaints in a complete review of systems (except as listed in HPI above).   Objective  Vitals:   09/30/22 0929  BP: 120/70  Pulse: 92  Resp: 16  SpO2: 99%  Weight: 179 lb (81.2 kg)  Height: 5\' 9"  (1.753 m)    Body mass index is 26.43 kg/m.  Physical Exam  Constitutional: Patient appears well-developed and well-nourished.  No distress.  HEENT: head atraumatic, normocephalic, pupils equal and reactive to light, neck suppl Cardiovascular: Normal rate, regular rhythm and normal heart sounds.  No murmur heard. No BLE edema. Pulmonary/Chest: Effort normal and breath sounds normal. No respiratory distress. Abdominal: Soft.  There is no tenderness. Psychiatric: Patient has a normal mood and affect. behavior is normal. Judgment and thought content normal.    PHQ2/9:    09/30/2022    9:29 AM 08/10/2022    3:13 PM 07/01/2022    8:22 AM 03/03/2022   11:00 AM 12/30/2021    9:32 AM  Depression  screen PHQ 2/9  Decreased Interest 0 1 2 1  0  Down, Depressed, Hopeless 0 1 2 1  0  PHQ - 2 Score 0 2 4 2  0  Altered sleeping 0  3 2 0  Tired, decreased energy 0   2 0  Change in appetite 0  0 1 0  Feeling bad or failure about yourself  0  0 0 0  Trouble concentrating 0  0 0 0  Moving slowly or fidgety/restless 0  0 0 0  Suicidal thoughts 0  0 0 0  PHQ-9 Score 0  7 7 0  Difficult doing work/chores    Somewhat difficult     phq 9 is negative   Fall Risk:    09/30/2022    9:28 AM 07/01/2022    8:22 AM 03/03/2022   10:55 AM 12/30/2021    9:31 AM 11/11/2021   10:21 AM  Fall Risk   Falls in the past year? 0 0 0 0 0  Number falls in past yr: 0 0  0 0  Injury with Fall? 0 0  0 0  Risk for fall due to : No Fall Risks No Fall Risks No Fall Risks No Fall Risks   Follow up Falls prevention discussed Falls prevention discussed Education provided;Falls evaluation completed;Falls prevention discussed Falls prevention discussed       Functional Status Survey: Is the patient deaf or have difficulty hearing?: No Does the patient have difficulty seeing, even when wearing glasses/contacts?: Yes Does the patient have difficulty concentrating, remembering, or making decisions?: No Does the patient have difficulty walking or climbing stairs?: No Does the patient have difficulty dressing or bathing?: No Does the patient have difficulty doing errands alone such as visiting a doctor's office or shopping?: No    Assessment & Plan  1. Atherosclerosis of aorta (HCC)  - rosuvastatin (CRESTOR) 40 MG tablet; TAKE 1 TABLET BY MOUTH ONCE DAILY IN  PLACE  OF  ATORVASTATIN  Dispense: 90 tablet; Refill: 0  2. Major depression in remission ([place])  Stable   3. Alcoholism ([place])  Needs to cut down on alcohol consumption  4. Hypopituitarism after procedure (HCC)  Improved, off medications  5. Diabetes insipidus (  [place])  Endo released him from their care   6. Secondary hyperparathyroidism of renal  origin [facility])  Reviewed labs with patient   7. Simple chronic bronchitis (HCC)  stable  8. Disorder of kidney due to lambda light chain disease ([place])  Seeing Dr. Grayland Ormond  9. Stage 4 chronic kidney disease ([place])  Under the care of Dr. Candiss Norse   10. Coronary artery disease due to calcified coronary lesion  Denies symptoms   11. Vitamin D deficiency   12. Smoldering myeloma   13. Essential hypertension  - amLODipine (NORVASC) 2.5 MG tablet; Take 1 tablet (2.5 mg total) by mouth daily.  Dispense: 30 tablet; Refill: 0  14. Atherosclerosis of native coronary artery of native heart without angina pectoris

## 2022-09-30 ENCOUNTER — Ambulatory Visit (INDEPENDENT_AMBULATORY_CARE_PROVIDER_SITE_OTHER): Payer: Medicare HMO | Admitting: Family Medicine

## 2022-09-30 ENCOUNTER — Ambulatory Visit: Payer: Medicare HMO | Admitting: Urology

## 2022-09-30 ENCOUNTER — Encounter: Payer: Self-pay | Admitting: Family Medicine

## 2022-09-30 VITALS — BP 120/70 | HR 92 | Resp 16 | Ht 69.0 in | Wt 179.0 lb

## 2022-09-30 DIAGNOSIS — F325 Major depressive disorder, single episode, in full remission: Secondary | ICD-10-CM

## 2022-09-30 DIAGNOSIS — I10 Essential (primary) hypertension: Secondary | ICD-10-CM | POA: Diagnosis not present

## 2022-09-30 DIAGNOSIS — E232 Diabetes insipidus: Secondary | ICD-10-CM

## 2022-09-30 DIAGNOSIS — E559 Vitamin D deficiency, unspecified: Secondary | ICD-10-CM | POA: Diagnosis not present

## 2022-09-30 DIAGNOSIS — I700 Atherosclerosis of aorta: Secondary | ICD-10-CM | POA: Diagnosis not present

## 2022-09-30 DIAGNOSIS — E893 Postprocedural hypopituitarism: Secondary | ICD-10-CM

## 2022-09-30 DIAGNOSIS — N184 Chronic kidney disease, stage 4 (severe): Secondary | ICD-10-CM | POA: Diagnosis not present

## 2022-09-30 DIAGNOSIS — N29 Other disorders of kidney and ureter in diseases classified elsewhere: Secondary | ICD-10-CM

## 2022-09-30 DIAGNOSIS — I2584 Coronary atherosclerosis due to calcified coronary lesion: Secondary | ICD-10-CM

## 2022-09-30 DIAGNOSIS — D472 Monoclonal gammopathy: Secondary | ICD-10-CM | POA: Diagnosis not present

## 2022-09-30 DIAGNOSIS — N2581 Secondary hyperparathyroidism of renal origin: Secondary | ICD-10-CM | POA: Diagnosis not present

## 2022-09-30 DIAGNOSIS — J410 Simple chronic bronchitis: Secondary | ICD-10-CM

## 2022-09-30 DIAGNOSIS — F1020 Alcohol dependence, uncomplicated: Secondary | ICD-10-CM

## 2022-09-30 DIAGNOSIS — R69 Illness, unspecified: Secondary | ICD-10-CM | POA: Diagnosis not present

## 2022-09-30 DIAGNOSIS — I2510 Atherosclerotic heart disease of native coronary artery without angina pectoris: Secondary | ICD-10-CM

## 2022-09-30 MED ORDER — ROSUVASTATIN CALCIUM 40 MG PO TABS: ORAL_TABLET | ORAL | 0 refills | Status: DC

## 2022-09-30 MED ORDER — LISINOPRIL 10 MG PO TABS: 10.0000 mg | ORAL_TABLET | Freq: Every day | ORAL | 1 refills | Status: DC

## 2022-09-30 MED ORDER — AMLODIPINE BESYLATE 2.5 MG PO TABS: 2.5000 mg | ORAL_TABLET | Freq: Every day | ORAL | 0 refills | Status: DC

## 2022-09-30 NOTE — Patient Instructions (Signed)
You need to go to the pharmacy and get Tdap

## 2022-10-07 ENCOUNTER — Encounter: Payer: Self-pay | Admitting: Urology

## 2022-10-07 ENCOUNTER — Ambulatory Visit: Payer: Medicare HMO

## 2022-10-07 ENCOUNTER — Ambulatory Visit (INDEPENDENT_AMBULATORY_CARE_PROVIDER_SITE_OTHER): Payer: Medicare HMO | Admitting: Urology

## 2022-10-07 VITALS — BP 151/101 | HR 94 | Ht 69.0 in | Wt 183.6 lb

## 2022-10-07 VITALS — BP 134/86

## 2022-10-07 DIAGNOSIS — I10 Essential (primary) hypertension: Secondary | ICD-10-CM

## 2022-10-07 DIAGNOSIS — N529 Male erectile dysfunction, unspecified: Secondary | ICD-10-CM | POA: Diagnosis not present

## 2022-10-07 DIAGNOSIS — C61 Malignant neoplasm of prostate: Secondary | ICD-10-CM

## 2022-10-07 DIAGNOSIS — Z8551 Personal history of malignant neoplasm of bladder: Secondary | ICD-10-CM

## 2022-10-07 DIAGNOSIS — Z9079 Acquired absence of other genital organ(s): Secondary | ICD-10-CM

## 2022-10-07 DIAGNOSIS — N393 Stress incontinence (female) (male): Secondary | ICD-10-CM | POA: Diagnosis not present

## 2022-10-07 MED ORDER — SILDENAFIL CITRATE 100 MG PO TABS: 100.0000 mg | ORAL_TABLET | Freq: Every day | ORAL | 6 refills | Status: DC | PRN

## 2022-10-07 NOTE — Progress Notes (Signed)
   10/07/2022 9:30 AM   [name] Nov 19, 1950 [id number]  Reason for visit: Follow up prostate cancer, ED, stress incontinence   HPI: I saw Mr. Ernest Sweeney in urology clinic today for prostate cancer follow-up.  He is a 72 year old African-American male that underwent a robotic prostatectomy and bilateral lymphadenectomy in January 2018 with Dr. Pilar Jarvis.  Final pathology showed Gleason score 3+4=7 prostate cancer stage [id number] Mx, there was focal extraprostatic extension and perineural invasion, margins were negative.  Pretreatment PSA was 6, and PSA after surgery was undetectable.   He developed biochemical recurrence in August 2019 with a PSA of 0.2 x2, and underwent salvage radiation to the prostatic fossa and 6 months of ADT(03/08/2018).   PSA 09/19/21 remained undetectable, he is scheduled for PSA blood work tomorrow.   From a urology perspective, minimally bothersome stress urinary incontinence with heavy coughing or sneezing, no pads needed.  We discussed Kegel exercises, as well as consideration of a Cunningham clamp in the future if worsening incontinence.  He is able to get erections with 100 mg sildenafil on demand, this was refilled.   We had a long conversation again about biochemical recurrence after prostatectomy, and the need for close PSA surveillance after salvage radiation.  We also reviewed the plan of monitoring the PSA, with consideration of observation versus other hormonal therapies if PSA recurrence in the future.    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  He does report some increased urinary frequency since that procedure.  He also was recently diagnosed with smoldering lambda chain myeloma with oncology and surveillance planned by oncology.  He also had some worsening renal function that has improved over the last month with most recent renal function  January 2024 creatinine 1.7, EGFR 43.  I reviewed the outside notes extensively  Sildenafil 100 mg refilled PSA tomorrow, call with results, if remains undetectable continue yearly follow-up for PSA monitoring  [place], [place] [address] [place], [place] [address] [phone]

## 2022-10-08 ENCOUNTER — Inpatient Hospital Stay: Payer: Medicare HMO | Attending: Oncology

## 2022-10-08 DIAGNOSIS — C61 Malignant neoplasm of prostate: Secondary | ICD-10-CM | POA: Diagnosis not present

## 2022-10-08 LAB — PSA: Prostatic Specific Antigen: <0.01 ng/mL (ref 0.00–4.00)

## 2022-10-14 ENCOUNTER — Other Ambulatory Visit: Payer: Self-pay | Admitting: Family Medicine

## 2022-10-14 ENCOUNTER — Ambulatory Visit: Payer: Medicare HMO

## 2022-10-14 VITALS — BP 130/90

## 2022-10-14 DIAGNOSIS — I10 Essential (primary) hypertension: Secondary | ICD-10-CM

## 2022-10-14 MED ORDER — AMLODIPINE BESYLATE 5 MG PO TABS: 5.0000 mg | ORAL_TABLET | Freq: Every day | ORAL | 0 refills | Status: DC

## 2022-10-14 NOTE — Progress Notes (Signed)
Patient presented in expressed good health today for follow up blood pressure reading. BP was obtained after letting patient rest for 5 minutes. Reading was: 130/90. Provider notified. Patient had no questions or concerns to express upon clinic departure and expressed understanding for follow up/medication advice.

## 2022-10-15 ENCOUNTER — Ambulatory Visit
Admission: RE | Admit: 2022-10-15 | Discharge: 2022-10-15 | Disposition: A | Discharge status: Home / Self Care | Payer: Medicare HMO | Source: Ambulatory Visit | Attending: Radiation Oncology | Admitting: Radiation Oncology

## 2022-10-15 ENCOUNTER — Encounter: Payer: Self-pay | Admitting: Radiation Oncology

## 2022-10-15 VITALS — BP 127/88 | HR 84 | Temp 98.1°F | Resp 12 | Ht 69.0 in | Wt 181.0 lb

## 2022-10-15 DIAGNOSIS — C9000 Multiple myeloma not having achieved remission: Secondary | ICD-10-CM | POA: Diagnosis not present

## 2022-10-15 DIAGNOSIS — C61 Malignant neoplasm of prostate: Secondary | ICD-10-CM | POA: Diagnosis not present

## 2022-10-15 DIAGNOSIS — Z923 Personal history of irradiation: Secondary | ICD-10-CM | POA: Insufficient documentation

## 2022-10-15 DIAGNOSIS — Z191 Hormone sensitive malignancy status: Secondary | ICD-10-CM | POA: Diagnosis not present

## 2022-10-15 NOTE — Progress Notes (Signed)
Radiation Oncology Follow up Note  Name: Ernest Sweeney   Date:   10/15/2022 MRN:  [id number] DOB: [birth year]    This 72 y.o. male presents to the clinic today for close to 5-year follow-up status post salvage radiation therapy to his prostatic fossa for stage IIIb adenocarcinoma the prostate status post robotic assisted prostatectomy with biochemical failure.  REFERRING PROVIDER: [name], MD  HPI: Patient is a 72 year old male now out close to 5 years having completed salvage radiation therapy to his prostatic bed status post robotic assisted prostatectomy with biochemical failure.  Seen today in routine follow-up he is doing well specifically denies any increased lower urinary tract symptoms diarrhea or fatigue.  His most recent PSA is less than 0.01 showing excellent biochemical control.Marland Kitchen  He is currently being followed by medical oncology for smoldering lambda chain myeloma.  COMPLICATIONS OF TREATMENT: none  FOLLOW UP COMPLIANCE: keeps appointments   PHYSICAL EXAM:  BP 127/88   Pulse 84   Temp 98.1 F (36.7 C) (Tympanic)   Resp 12   Ht 5\' 9"  (1.753 m)   Wt 181 lb (82.1 kg)   BMI 26.73 kg/m  Well-developed well-nourished patient in NAD. HEENT reveals PERLA, EOMI, discs not visualized.  Oral cavity is clear. No oral mucosal lesions are identified. Neck is clear without evidence of cervical or supraclavicular adenopathy. Lungs are clear to A&P. Cardiac examination is essentially unremarkable with regular rate and rhythm without murmur rub or thrill. Abdomen is benign with no organomegaly or masses noted. Motor sensory and DTR levels are equal and symmetric in the upper and lower extremities. Cranial nerves II through XII are grossly intact. Proprioception is intact. No peripheral adenopathy or edema is identified. No motor or sensory levels are noted. Crude visual fields are within normal range.  1  RADIOLOGY RESULTS: No current films for review  PLAN: Present time I am going  to turn follow-up care over to his PMD.  He has excellent biochemical control of his prostate cancer now out close to 5 years after salvage radiation.  He has very low side effect profile.  Be happy to reevaluate the patient anytime should that be indicated.  I would like to take this opportunity to thank you for allowing me to participate in the care of your patient.Noreene Filbert, MD

## 2022-10-26 ENCOUNTER — Other Ambulatory Visit: Payer: Self-pay | Admitting: Family Medicine

## 2022-10-26 DIAGNOSIS — I10 Essential (primary) hypertension: Secondary | ICD-10-CM

## 2022-10-28 ENCOUNTER — Encounter: Payer: Self-pay | Admitting: *Deleted

## 2022-10-28 ENCOUNTER — Telehealth: Payer: Self-pay | Admitting: Family Medicine

## 2022-10-28 ENCOUNTER — Other Ambulatory Visit: Payer: Self-pay | Admitting: Family Medicine

## 2022-10-28 DIAGNOSIS — I10 Essential (primary) hypertension: Secondary | ICD-10-CM

## 2022-10-28 NOTE — Telephone Encounter (Signed)
Last refill requested 10/28/22. Receipt confirmed by pharmacy 10/14/22 at 2:16pm. Need to contact pharmacy to verify. Patient reports pharmacy states medication request has not been send and refused refill due to too soon.

## 2022-10-28 NOTE — Telephone Encounter (Signed)
Too soon

## 2022-10-28 NOTE — Telephone Encounter (Signed)
Medication Refill - Medication: amLODipine (NORVASC) 5 MG tablet  Has the patient contacted their pharmacy? Yes.   Pt was at pharmacy and told that they have not received Rx refill approval  Preferred Pharmacy (with phone number or street name):  [facility] [address] [place]), [place] - [address] Phone: [phone]  Fax: [phone]     Has the patient been seen for an appointment in the last year OR does the patient have an upcoming appointment? Yes.    Agent: Please be advised that RX refills may take up to 3 business days. We ask that you follow-up with your pharmacy.

## 2022-10-28 NOTE — Telephone Encounter (Signed)
This encounter was created in error - please disregard.

## 2022-10-28 NOTE — Telephone Encounter (Signed)
Pt is calling in because he was trying to get his medication amLODipine (NORVASC) 5 MG tablet [[id number]] refilled. Refill was refused because it is too soon to fill as the medication was just filled on 10/14/22. Pharmacy says the medication was picked up on 10/21/22 at 2:48 PM. Pt says he did not pick the medication up, and no one else picked it up on his behalf. Pt says he was instructed not to take Lisinopril anymore. Pt says he has a few Lisinopril left and wants to know can he take that in the meantime. Please follow up with pt.

## 2022-10-29 DIAGNOSIS — R809 Proteinuria, unspecified: Secondary | ICD-10-CM | POA: Diagnosis not present

## 2022-10-29 DIAGNOSIS — N29 Other disorders of kidney and ureter in diseases classified elsewhere: Secondary | ICD-10-CM | POA: Diagnosis not present

## 2022-10-29 DIAGNOSIS — N2581 Secondary hyperparathyroidism of renal origin: Secondary | ICD-10-CM | POA: Diagnosis not present

## 2022-10-29 DIAGNOSIS — D808 Other immunodeficiencies with predominantly antibody defects: Secondary | ICD-10-CM | POA: Diagnosis not present

## 2022-10-29 DIAGNOSIS — N1832 Chronic kidney disease, stage 3b: Secondary | ICD-10-CM | POA: Diagnosis not present

## 2022-10-29 NOTE — Telephone Encounter (Signed)
Requested medication (s) are due for refill today: routing for review  Requested medication (s) are on the active medication list: yes  Last refill:  10/14/22  Future visit scheduled: yes  Notes to clinic:  Medication recently refused due to request being too soon. Patient states he did not receive medication, pharmacy states medication was picked up, routing for approval.     Requested Prescriptions  Pending Prescriptions Disp Refills   amLODipine (NORVASC) 5 MG tablet 30 tablet 0    Sig: Take 1 tablet (5 mg total) by mouth daily.     Cardiovascular: Calcium Channel Blockers 2 Passed - 10/28/2022 10:16 AM      Passed - Last BP in normal range    BP Readings from Last 1 Encounters:  10/15/22 127/88         Passed - Last Heart Rate in normal range    Pulse Readings from Last 1 Encounters:  10/15/22 84         Passed - Valid encounter within last 6 months    Recent Outpatient Visits           4 weeks ago Atherosclerosis of aorta [facility])   [facility] Alba Cory, MD   4 months ago Simple chronic bronchitis [facility])   [facility] Alba Cory, MD   10 months ago Atherosclerosis of aorta [facility])   [facility] Alba Cory, MD   11 months ago Acute cough   [facility] Margarita Mail, DO   1 year ago Centrilobular emphysema [facility])   [facility] Alba Cory, MD       Future Appointments             In 2 months Carlynn Purl, Danna Hefty, MD [facility]   In 4 months Alba Cory, MD [facility]

## 2022-11-10 ENCOUNTER — Telehealth: Payer: Self-pay | Admitting: Family Medicine

## 2022-11-10 NOTE — Telephone Encounter (Signed)
Contacted [name] to schedule their annual wellness visit. Appointment made for 12/10/2022.  [facility] Care Guide [facility] AWV TEAM Direct Dial: [phone]

## 2022-11-18 ENCOUNTER — Other Ambulatory Visit: Payer: Self-pay | Admitting: Family Medicine

## 2022-11-18 DIAGNOSIS — I10 Essential (primary) hypertension: Secondary | ICD-10-CM

## 2022-11-20 ENCOUNTER — Ambulatory Visit: Admission: RE | Admit: 2022-11-20 | Payer: Medicare HMO | Source: Ambulatory Visit

## 2022-11-24 ENCOUNTER — Inpatient Hospital Stay: Payer: Medicare HMO | Attending: Oncology

## 2022-11-24 DIAGNOSIS — N179 Acute kidney failure, unspecified: Secondary | ICD-10-CM | POA: Diagnosis not present

## 2022-11-24 DIAGNOSIS — C61 Malignant neoplasm of prostate: Secondary | ICD-10-CM | POA: Insufficient documentation

## 2022-11-24 DIAGNOSIS — D72819 Decreased white blood cell count, unspecified: Secondary | ICD-10-CM | POA: Insufficient documentation

## 2022-11-24 DIAGNOSIS — Z87891 Personal history of nicotine dependence: Secondary | ICD-10-CM | POA: Diagnosis not present

## 2022-11-24 DIAGNOSIS — Z79899 Other long term (current) drug therapy: Secondary | ICD-10-CM | POA: Diagnosis not present

## 2022-11-24 DIAGNOSIS — D808 Other immunodeficiencies with predominantly antibody defects: Secondary | ICD-10-CM

## 2022-11-24 DIAGNOSIS — D472 Monoclonal gammopathy: Secondary | ICD-10-CM | POA: Insufficient documentation

## 2022-11-24 LAB — CMP (CANCER CENTER ONLY)
ALT: 15 U/L (ref 0–44)
AST: 21 U/L (ref 15–41)
Albumin: 4.4 g/dL (ref 3.5–5.0)
Alkaline Phosphatase: 52 U/L (ref 38–126)
Anion gap: 8 (ref 5–15)
BUN: 12 mg/dL (ref 8–23)
CO2: 26 mmol/L (ref 22–32)
Calcium: 9.5 mg/dL (ref 8.9–10.3)
Chloride: 105 mmol/L (ref 98–111)
Creatinine: 1.44 mg/dL — ABNORMAL HIGH (ref 0.61–1.24)
GFR, Estimated: 52 mL/min — ABNORMAL LOW (ref >60)
Glucose, Bld: 92 mg/dL (ref 70–99)
Potassium: 3.8 mmol/L (ref 3.5–5.1)
Sodium: 139 mmol/L (ref 135–145)
Total Bilirubin: 0.6 mg/dL (ref 0.3–1.2)
Total Protein: 7.4 g/dL (ref 6.5–8.1)

## 2022-11-24 LAB — CBC WITH DIFFERENTIAL (CANCER CENTER ONLY)
Abs Immature Granulocytes: 0 10*3/uL (ref 0.00–0.07)
Basophils Absolute: 0 10*3/uL (ref 0.0–0.1)
Basophils Relative: 1 %
Eosinophils Absolute: 0.1 10*3/uL (ref 0.0–0.5)
Eosinophils Relative: 2 %
HCT: 41.8 % (ref 39.0–52.0)
Hemoglobin: 14.2 g/dL (ref 13.0–17.0)
Immature Granulocytes: 0 %
Lymphocytes Relative: 32 %
Lymphs Abs: 1.1 10*3/uL (ref 0.7–4.0)
MCH: 32.2 pg (ref 26.0–34.0)
MCHC: 34 g/dL (ref 30.0–36.0)
MCV: 94.8 fL (ref 80.0–100.0)
Monocytes Absolute: 0.4 10*3/uL (ref 0.1–1.0)
Monocytes Relative: 11 %
Neutro Abs: 1.8 10*3/uL (ref 1.7–7.7)
Neutrophils Relative %: 54 %
Platelet Count: 318 10*3/uL (ref 150–400)
RBC: 4.41 MIL/uL (ref 4.22–5.81)
RDW: 12.4 % (ref 11.5–15.5)
WBC Count: 3.3 10*3/uL — ABNORMAL LOW (ref 4.0–10.5)
nRBC: 0 % (ref 0.0–0.2)

## 2022-11-25 DIAGNOSIS — D352 Benign neoplasm of pituitary gland: Secondary | ICD-10-CM | POA: Diagnosis not present

## 2022-11-25 DIAGNOSIS — D444 Neoplasm of uncertain behavior of craniopharyngeal duct: Secondary | ICD-10-CM | POA: Diagnosis not present

## 2022-11-25 LAB — KAPPA/LAMBDA LIGHT CHAINS
Kappa free light chain: 18.4 mg/L (ref 3.3–19.4)
Kappa, lambda light chain ratio: 0.11 — ABNORMAL LOW (ref 0.26–1.65)
Lambda free light chains: 166.4 mg/L — ABNORMAL HIGH (ref 5.7–26.3)

## 2022-11-25 LAB — IGG, IGA, IGM
IgA: 51 mg/dL — ABNORMAL LOW (ref 61–437)
IgG (Immunoglobin G), Serum: 971 mg/dL (ref 603–1613)
IgM (Immunoglobulin M), Srm: 285 mg/dL — ABNORMAL HIGH (ref 15–143)

## 2022-11-26 LAB — PROTEIN ELECTROPHORESIS, SERUM
A/G Ratio: 1.5 (ref 0.7–1.7)
Albumin ELP: 4.1 g/dL (ref 2.9–4.4)
Alpha-1-Globulin: 0.2 g/dL (ref 0.0–0.4)
Alpha-2-Globulin: 0.6 g/dL (ref 0.4–1.0)
Beta Globulin: 0.9 g/dL (ref 0.7–1.3)
Gamma Globulin: 1.1 g/dL (ref 0.4–1.8)
Globulin, Total: 2.8 g/dL (ref 2.2–3.9)
M-Spike, %: 0.4 g/dL — ABNORMAL HIGH
Total Protein ELP: 6.9 g/dL (ref 6.0–8.5)

## 2022-11-27 LAB — PROTEIN ELECTRO, RANDOM URINE
Albumin ELP, Urine: 13.6 %
Alpha-1-Globulin, U: 2.3 %
Alpha-2-Globulin, U: 7 %
Beta Globulin, U: 7.9 %
Gamma Globulin, U: 69.2 %
M Component, Ur: 58.6 % — ABNORMAL HIGH
Total Protein, Urine: 29 mg/dL

## 2022-12-01 ENCOUNTER — Ambulatory Visit: Payer: Medicare HMO | Admitting: Oncology

## 2022-12-02 ENCOUNTER — Encounter: Payer: Self-pay | Admitting: Oncology

## 2022-12-02 ENCOUNTER — Inpatient Hospital Stay: Payer: Medicare HMO | Admitting: Oncology

## 2022-12-02 VITALS — BP 128/96 | HR 106 | Temp 96.0°F | Wt 184.8 lb

## 2022-12-02 DIAGNOSIS — Z87891 Personal history of nicotine dependence: Secondary | ICD-10-CM | POA: Diagnosis not present

## 2022-12-02 DIAGNOSIS — D472 Monoclonal gammopathy: Secondary | ICD-10-CM | POA: Diagnosis not present

## 2022-12-02 DIAGNOSIS — N179 Acute kidney failure, unspecified: Secondary | ICD-10-CM | POA: Diagnosis not present

## 2022-12-02 DIAGNOSIS — Z79899 Other long term (current) drug therapy: Secondary | ICD-10-CM | POA: Diagnosis not present

## 2022-12-02 DIAGNOSIS — D72819 Decreased white blood cell count, unspecified: Secondary | ICD-10-CM | POA: Diagnosis not present

## 2022-12-02 DIAGNOSIS — C61 Malignant neoplasm of prostate: Secondary | ICD-10-CM | POA: Diagnosis not present

## 2022-12-02 NOTE — Progress Notes (Signed)
[facility]  Telephone:(336) [phone] Fax:(336) [phone]  ID: Ernest Sweeney OB: 01-15-1951  MR#: [id number]  AOZ#:[id number]  Patient Care Team: Alba Cory, MD as PCP - General (Family Medicine) Riki Altes, MD as Consulting Physician (Urology) Sondra Come, MD as Consulting Physician (Urology) Carmina Miller, MD as Referring Physician (Radiation Oncology) Alwyn Pea, MD as Consulting Physician (Cardiology) Kemper Durie, RN as [facility] Care Management  CHIEF COMPLAINT: Smoldering lambda chain myeloma with q13-.  INTERVAL HISTORY: Patient returns to clinic today for repeat laboratory work and further evaluation.  He continues to feel well and remains asymptomatic.  He has no neurologic complaints. He denies any recent fevers or illnesses.  He has a good appetite and denies weight loss.  He has no chest pain, shortness of breath, cough, or hemoptysis.  He denies any nausea, vomiting, constipation, or diarrhea.  He has no urinary complaints.  Patient offers no specific complaints today.  REVIEW OF SYSTEMS:   Review of Systems  Constitutional: Negative.  Negative for fever, malaise/fatigue and weight loss.  Eyes:  Negative for blurred vision and double vision.  Respiratory: Negative.  Negative for cough, hemoptysis and shortness of breath.   Cardiovascular: Negative.  Negative for chest pain and leg swelling.  Gastrointestinal: Negative.  Negative for abdominal pain.  Genitourinary: Negative.  Negative for dysuria.  Musculoskeletal: Negative.  Negative for back pain.  Skin: Negative.  Negative for rash.  Neurological: Negative.  Negative for dizziness, focal weakness, weakness and headaches.  Psychiatric/Behavioral: Negative.  The patient is not nervous/anxious.     As per HPI. Otherwise, a complete review of systems is negative.  PAST MEDICAL HISTORY: Past Medical History:  Diagnosis Date   Anxiety    Panic attacks in the  past   Arthritis    Benign prostatic hypertrophy without lower urinary tract symptoms    Cancer (HCC)    Prostate. monitoring now since surgery   Colon polyp    GERD (gastroesophageal reflux disease)    Glaucoma (increased eye pressure)    Left eye only   Hyperlipidemia    Hypertension     PAST SURGICAL HISTORY: Past Surgical History:  Procedure Laterality Date   COLONOSCOPY  07/13/2008   Dr Carollee Massed   COLONOSCOPY WITH PROPOFOL N/A 01/05/2020   Procedure: COLONOSCOPY WITH PROPOFOL;  Surgeon: Wyline Mood, MD;  Location: [facility] ENDOSCOPY;  Service: Gastroenterology;  Laterality: N/A;   excision skin cyst  06/19/2014   Dr. Cathren Harsh CYST   INGUINAL HERNIA REPAIR Right 09/06/2020   Procedure: HERNIA REPAIR RIGH  INGUINAL WITH MESH ADULT, open with RNFA to assist;  Surgeon: Duanne Guess, MD;  Location: [facility] ORS;  Service: General;  Laterality: Right;  Provider requesting 2 hours/120 minutes for procedure.   Neuroendoscopy  05/28/2022   With excision pituitary tumor transnasal or transphenoidal approach. Resection of craniopharyngioma.   PELVIC LYMPH NODE DISSECTION N/A 07/15/2016   Procedure: PELVIC LYMPH NODE DISSECTION;  Surgeon: Hildred Laser, MD;  Location: [facility] ORS;  Service: Urology;  Laterality: N/A;   ROBOT ASSISTED LAPAROSCOPIC RADICAL PROSTATECTOMY N/A 07/15/2016   Procedure: ROBOTIC ASSISTED LAPAROSCOPIC RADICAL PROSTATECTOMY;  Surgeon: Hildred Laser, MD;  Location: [facility] ORS;  Service: Urology;  Laterality: N/A;    FAMILY HISTORY: Family History  Problem Relation Age of Onset   Heart disease Mother    Heart attack Mother    Diabetes Father    Heart attack Father    Dementia Father    Cancer  Sister        Breast   Cancer Brother        Prostate   Healthy Sister    Stroke Brother     ADVANCED DIRECTIVES (Y/N):  N  HEALTH MAINTENANCE: Social History   Tobacco Use   Smoking status: Former    Packs/day: 0.50    Years: 40.00    Additional  pack years: 0.00    Total pack years: 20.00    Types: Cigarettes    Quit date: 03/23/2016    Years since quitting: 6.6   Smokeless tobacco: Never  Vaping Use   Vaping Use: Never used  Substance Use Topics   Alcohol use: Yes    Alcohol/week: 2.0 - 4.0 standard drinks of alcohol    Types: 2 - 4 Standard drinks or equivalent per week    Comment: 1-2 times per week   Drug use: No     Colonoscopy:  PAP:  Bone density:  Lipid panel:  No Known Allergies  Current Outpatient Medications  Medication Sig Dispense Refill   amLODipine (NORVASC) 5 MG tablet Take 1 tablet by mouth once daily 30 tablet 1   aspirin 81 MG tablet Take 81 mg by mouth daily.     brimonidine-timolol (COMBIGAN) 0.2-0.5 % ophthalmic solution Place 1 drop into both eyes every 12 (twelve) hours.     Cholecalciferol (VITAMIN D) 2000 units CAPS Take 1 capsule (2,000 Units total) by mouth daily. 30 capsule 0   fluticasone (FLONASE) 50 MCG/ACT nasal spray Place 2 sprays into both nostrils daily. 16 g 6   latanoprost (XALATAN) 0.005 % ophthalmic solution Place 1 drop into both eyes at bedtime.      rosuvastatin (CRESTOR) 40 MG tablet TAKE 1 TABLET BY MOUTH ONCE DAILY IN  PLACE  OF  ATORVASTATIN 90 tablet 0   sildenafil (VIAGRA) 100 MG tablet Take 1 tablet (100 mg total) by mouth daily as needed for erectile dysfunction. 30 tablet 6   tiZANidine (ZANAFLEX) 2 MG tablet Take 1-2 tablets (2-4 mg total) by mouth every 6 (six) hours as needed for muscle spasms. 60 tablet 0   triamcinolone cream (KENALOG) 0.1 % APPLY TOPICALLY AS DIRECTED TWICE DAILY 45 g 0   thiamine (VITAMIN B1) 100 MG tablet Take 100 mg by mouth daily. (Patient not taking: Reported on 12/02/2022)     No current facility-administered medications for this visit.    OBJECTIVE: Vitals:   12/02/22 1028  BP: (!) 128/96  Pulse: (!) 106  Temp: (!) 96 F (35.6 C)  SpO2: 100%     Body mass index is 27.29 kg/m.    ECOG FS:0 - Asymptomatic  General:  Well-developed, well-nourished, no acute distress. Eyes: Pink conjunctiva, anicteric sclera. HEENT: Normocephalic, moist mucous membranes. Lungs: No audible wheezing or coughing. Heart: Regular rate and rhythm. Abdomen: Soft, nontender, no obvious distention. Musculoskeletal: No edema, cyanosis, or clubbing. Neuro: Alert, answering all questions appropriately. Cranial nerves grossly intact. Skin: No rashes or petechiae noted. Psych: Normal affect.  LAB RESULTS:  Lab Results  Component Value Date   NA 139 11/24/2022   K 3.8 11/24/2022   CL 105 11/24/2022   CO2 26 11/24/2022   GLUCOSE 92 11/24/2022   BUN 12 11/24/2022   CREATININE 1.44 (H) 11/24/2022   CALCIUM 9.5 11/24/2022   PROT 7.4 11/24/2022   ALBUMIN 4.4 11/24/2022   AST 21 11/24/2022   ALT 15 11/24/2022   ALKPHOS 52 11/24/2022   BILITOT 0.6 11/24/2022   GFRNONAA  52 (L) 11/24/2022   GFRAA 74 05/02/2020    Lab Results  Component Value Date   WBC 3.3 (L) 11/24/2022   NEUTROABS 1.8 11/24/2022   HGB 14.2 11/24/2022   HCT 41.8 11/24/2022   MCV 94.8 11/24/2022   PLT 318 11/24/2022     STUDIES: No results found.  ASSESSMENT: Smoldering lambda chain myeloma with q13-, MYD88 mutation.  PLAN:    Smoldering lambda chain myeloma: Bone marrow biopsy completed on August 19, 2022 revealed approximately 25% plasma cells.  Cytogenetics reviewed q13 - mutation. The MYD 21 mutation is associated with IgM myeloma as well as Waldenstrm's and indicates an increased risk to progression to overt myeloma.  Patient's M spike has ranged from 0.3-0.4 since August 10, 2022.  His IgM component has ranged from 285-294 and his lambda free light chains have ranged from 166.4-227.6 over the same timeframe.  His kidney function continues to improve and he has a chronic leukopenia. He has no other evidence of endorgan disease.  Previously, case discussed with nephrology.  No treatment is needed at this time.  Patient expressed understanding  that he may require treatment in the future.  Return to clinic in 3 months for laboratory work only and then in 6 months for laboratory work and further evaluation.   Acute renal failure: Creatinine continues to improve and is now 1.44.  Continue follow-up with nephrology as indicated.  Bone marrow biopsy as above.   Leukopenia: Chronic and unchanged.  Bone marrow biopsy results as above. Pituitary tumor: Patient underwent surgical resection at [facility] in November 2023.  Continue follow-up and imaging with MRI with [facility] neurosurgery. Prostate cancer: Patient underwent prostatectomy followed by XRT for local control.  PSA is monitored by primary care.    Patient expressed understanding and was in agreement with this plan. He also understands that He can call clinic at any time with any questions, concerns, or complaints.    Cancer Staging  No matching staging information was found for the patient.  Jeralyn Ruths, MD   12/02/2022 10:45 AM

## 2022-12-10 ENCOUNTER — Ambulatory Visit (INDEPENDENT_AMBULATORY_CARE_PROVIDER_SITE_OTHER): Payer: Medicare HMO

## 2022-12-10 ENCOUNTER — Telehealth: Payer: Self-pay

## 2022-12-10 VITALS — Ht 69.0 in | Wt 184.0 lb

## 2022-12-10 DIAGNOSIS — Z0000 Encounter for general adult medical examination without abnormal findings: Secondary | ICD-10-CM

## 2022-12-10 DIAGNOSIS — Z5941 Food insecurity: Secondary | ICD-10-CM

## 2022-12-10 NOTE — Progress Notes (Signed)
I connected with  Ernest Sweeney on 12/10/22 by a audio enabled telemedicine application and verified that I am speaking with the correct person using two identifiers.  Patient Location: Home  Provider Location: Office/Clinic  I discussed the limitations of evaluation and management by telemedicine. The patient expressed understanding and agreed to proceed.  Subjective:   Ernest Sweeney is a 72 y.o. male who presents for Medicare Annual/Subsequent preventive examination.  Review of Systems    Cardiac Risk Factors include: advanced age (>57men, >24 women);dyslipidemia;hypertension;male gender    Objective:    Today's Vitals   12/10/22 0849  Weight: 184 lb (83.5 kg)  Height: 5\' 9"  (1.753 m)   Body mass index is 27.17 kg/m.     12/10/2022    8:59 AM 12/02/2022   10:28 AM 12/02/2022   10:21 AM 10/15/2022    9:18 AM 08/26/2022   10:32 AM 08/19/2022    7:27 AM 08/10/2022   11:22 AM  Advanced Directives  Does Patient Have a Medical Advance Directive? No No No No No No No  Would patient like information on creating a medical advance directive?    No - Patient declined No - Patient declined  No - Patient declined    Current Medications (verified) Outpatient Encounter Medications as of 12/10/2022  Medication Sig   amLODipine (NORVASC) 5 MG tablet Take 1 tablet by mouth once daily   aspirin 81 MG tablet Take 81 mg by mouth daily.   brimonidine-timolol (COMBIGAN) 0.2-0.5 % ophthalmic solution Place 1 drop into both eyes every 12 (twelve) hours.   Cholecalciferol (VITAMIN D) 2000 units CAPS Take 1 capsule (2,000 Units total) by mouth daily.   fluticasone (FLONASE) 50 MCG/ACT nasal spray Place 2 sprays into both nostrils daily.   latanoprost (XALATAN) 0.005 % ophthalmic solution Place 1 drop into both eyes at bedtime.    rosuvastatin (CRESTOR) 40 MG tablet TAKE 1 TABLET BY MOUTH ONCE DAILY IN  PLACE  OF  ATORVASTATIN   sildenafil (VIAGRA) 100 MG tablet Take 1 tablet (100 mg total) by mouth  daily as needed for erectile dysfunction.   thiamine (VITAMIN B1) 100 MG tablet Take 100 mg by mouth daily.   tiZANidine (ZANAFLEX) 2 MG tablet Take 1-2 tablets (2-4 mg total) by mouth every 6 (six) hours as needed for muscle spasms.   triamcinolone cream (KENALOG) 0.1 % APPLY TOPICALLY AS DIRECTED TWICE DAILY   No facility-administered encounter medications on file as of 12/10/2022.    Allergies (verified) Patient has no known allergies.   History: Past Medical History:  Diagnosis Date   Anxiety    Panic attacks in the past   Arthritis    Benign prostatic hypertrophy without lower urinary tract symptoms    Cancer (HCC)    Prostate. monitoring now since surgery   Colon polyp    GERD (gastroesophageal reflux disease)    Glaucoma (increased eye pressure)    Left eye only   Hyperlipidemia    Hypertension    Past Surgical History:  Procedure Laterality Date   COLONOSCOPY  07/13/2008   Dr Carollee Massed   COLONOSCOPY WITH PROPOFOL N/A 01/05/2020   Procedure: COLONOSCOPY WITH PROPOFOL;  Surgeon: Wyline Mood, MD;  Location: [facility] ENDOSCOPY;  Service: Gastroenterology;  Laterality: N/A;   excision skin cyst  06/19/2014   Dr. Cathren Harsh CYST   INGUINAL HERNIA REPAIR Right 09/06/2020   Procedure: HERNIA REPAIR RIGH  INGUINAL WITH MESH ADULT, open with RNFA to assist;  Surgeon: Duanne Guess, MD;  Location: [facility] ORS;  Service: General;  Laterality: Right;  Provider requesting 2 hours/120 minutes for procedure.   Neuroendoscopy  05/28/2022   With excision pituitary tumor transnasal or transphenoidal approach. Resection of craniopharyngioma.   PELVIC LYMPH NODE DISSECTION N/A 07/15/2016   Procedure: PELVIC LYMPH NODE DISSECTION;  Surgeon: Hildred Laser, MD;  Location: [facility] ORS;  Service: Urology;  Laterality: N/A;   ROBOT ASSISTED LAPAROSCOPIC RADICAL PROSTATECTOMY N/A 07/15/2016   Procedure: ROBOTIC ASSISTED LAPAROSCOPIC RADICAL PROSTATECTOMY;  Surgeon: Hildred Laser, MD;   Location: [facility] ORS;  Service: Urology;  Laterality: N/A;   Family History  Problem Relation Age of Onset   Heart disease Mother    Heart attack Mother    Diabetes Father    Heart attack Father    Dementia Father    Cancer Sister        Breast   Cancer Brother        Prostate   Healthy Sister    Stroke Brother    Social History   Socioeconomic History   Marital status: Widowed    Spouse name: Not on file   Number of children: 0   Years of education: Not on file   Highest education level: 12th grade  Occupational History   Occupation: custodian     Comment: part time   Tobacco Use   Smoking status: Former    Packs/day: 0.50    Years: 40.00    Additional pack years: 0.00    Total pack years: 20.00    Types: Cigarettes    Quit date: 03/23/2016    Years since quitting: 6.7   Smokeless tobacco: Never  Vaping Use   Vaping Use: Never used  Substance and Sexual Activity   Alcohol use: Yes    Alcohol/week: 2.0 - 4.0 standard drinks of alcohol    Types: 2 - 4 Standard drinks or equivalent per week    Comment: 1-2 times per week   Drug use: No   Sexual activity: Yes    Birth control/protection: Condom  Other Topics Concern   Not on file  Social History Narrative   Patient lives alone.    He will be staying with his sister after surgery.   Social Determinants of Health   Financial Resource Strain: Medium Risk (12/10/2022)   Overall Financial Resource Strain (CARDIA)    Difficulty of Paying Living Expenses: Somewhat hard  Food Insecurity: Food Insecurity Present (12/10/2022)   Hunger Vital Sign    Worried About Running Out of Food in the Last Year: Sometimes true    Ran Out of Food in the Last Year: Sometimes true  Transportation Needs: No Transportation Needs (12/10/2022)   PRAPARE - Administrator, Civil Service (Medical): No    Lack of Transportation (Non-Medical): No  Physical Activity: Insufficiently Active (12/10/2022)   Exercise Vital Sign    Days of  Exercise per Week: 2 days    Minutes of Exercise per Session: 30 min  Stress: Stress Concern Present (12/10/2022)   [employer] of Occupational Health - Occupational Stress Questionnaire    Feeling of Stress : Rather much  Social Connections: Socially Isolated (12/10/2022)   Social Connection and Isolation Panel [NHANES]    Frequency of Communication with Friends and Family: Twice a week    Frequency of Social Gatherings with Friends and Family: Once a week    Attends Religious Services: Never    Database administrator or Organizations: No    Attends  Club or Organization Meetings: Never    Marital Status: Widowed    Tobacco Counseling Counseling given: Not Answered   Clinical Intake:  Pre-visit preparation completed: Yes  Pain : No/denies pain     BMI - recorded: 27.17 Nutritional Status: BMI 25 -29 Overweight Nutritional Risks: None Diabetes: No  How often do you need to have someone help you when you read instructions, pamphlets, or other written materials from your doctor or pharmacy?: 1 - Never  Diabetic?no  Interpreter Needed?: No  Comments: lives alone Information entered by :: B.Ledarius Leeson,LPN   Activities of Daily Living    12/10/2022    9:00 AM 09/30/2022    9:29 AM  In your present state of health, do you have any difficulty performing the following activities:  Hearing? 0 0  Vision? 1 1  Difficulty concentrating or making decisions? 0 0  Walking or climbing stairs? 0 0  Dressing or bathing? 0 0  Doing errands, shopping? 0 0  Preparing Food and eating ? N   Using the Toilet? N   In the past six months, have you accidently leaked urine? N   Do you have problems with loss of bowel control? N   Managing your Medications? N   Managing your Finances? N   Housekeeping or managing your Housekeeping? N     Patient Care Team: Alba Cory, MD as PCP - General (Family Medicine) Riki Altes, MD as Consulting Physician (Urology) Sondra Come, MD as Consulting Physician (Urology) Carmina Miller, MD as Referring Physician (Radiation Oncology) Alwyn Pea, MD as Consulting Physician (Cardiology) Kemper Durie, RN as [facility] Care Management  Indicate any recent Medical Services you may have received from other than [facility] providers in the past year (date may be approximate).     Assessment:   This is a routine wellness examination for Mertens.  Hearing/Vision screen Hearing Screening - Comments:: Adequate hearing Vision Screening - Comments:: Left eye blurry Rt vision better Dr Genelle Gather  Dietary issues and exercise activities discussed: Current Exercise Habits: Home exercise routine, Type of exercise: walking, Time (Minutes): 20, Frequency (Times/Week): 3, Weekly Exercise (Minutes/Week): 60, Intensity: Mild   Goals Addressed   None    Depression Screen    12/10/2022    8:56 AM 09/30/2022    9:29 AM 08/10/2022    3:13 PM 07/01/2022    8:22 AM 03/03/2022   11:00 AM 12/30/2021    9:32 AM 11/11/2021   10:22 AM  PHQ 2/9 Scores  PHQ - 2 Score 0 0 2 4 2  0 0  PHQ- 9 Score  0  7 7 0 0    Fall Risk    12/10/2022    8:54 AM 09/30/2022    9:28 AM 07/01/2022    8:22 AM 03/03/2022   10:55 AM 12/30/2021    9:31 AM  Fall Risk   Falls in the past year? 0 0 0 0 0  Number falls in past yr: 0 0 0  0  Injury with Fall? 0 0 0  0  Risk for fall due to : No Fall Risks No Fall Risks No Fall Risks No Fall Risks No Fall Risks  Follow up Education provided;Falls prevention discussed Falls prevention discussed Falls prevention discussed Education provided;Falls evaluation completed;Falls prevention discussed Falls prevention discussed    FALL RISK PREVENTION PERTAINING TO THE HOME:  Any stairs in or around the home? Yes  If so, are there any without handrails? Yes  Home  free of loose throw rugs in walkways, pet beds, electrical cords, etc? Yes  Adequate lighting in your home to reduce risk of falls? Yes    ASSISTIVE DEVICES UTILIZED TO PREVENT FALLS:  Life alert? No  Use of a cane, walker or w/c? No  Grab bars in the bathroom? No  Shower chair or bench in shower? No  Elevated toilet seat or a handicapped toilet? No   Cognitive Function:        12/10/2022    9:06 AM 03/03/2022   11:03 AM 02/27/2020   10:44 AM 02/21/2019   11:13 AM 02/11/2018   10:33 AM  6CIT Screen  What Year? 0 points 0 points 0 points 0 points 0 points  What month? 0 points 0 points 0 points 0 points 0 points  What time? 0 points 0 points 0 points 0 points 0 points  Count back from 20 0 points 0 points 0 points 0 points 0 points  Months in reverse 0 points 0 points 0 points 0 points 0 points  Repeat phrase 0 points 0 points 2 points 2 points 0 points  Total Score 0 points 0 points 2 points 2 points 0 points    Immunizations Immunization History  Administered Date(s) Administered   Fluad Quad(high Dose 65+) 05/02/2019, 05/02/2020   Influenza, High Dose Seasonal PF 05/21/2016, 05/07/2017, 04/27/2018   Influenza-Unspecified 04/12/2014, 04/27/2015, 05/09/2021, 05/11/2022   Moderna SARS-COV2 Booster Vaccination 10/24/2020, 05/11/2022   Moderna Sars-Covid-2 Vaccination 08/23/2019, 09/20/2019, 05/08/2020, 05/09/2021   Pneumococcal Conjugate-13 02/05/2017   Pneumococcal Polysaccharide-23 03/08/2012, 02/11/2018   Tdap 04/03/2010   Zoster, Live 02/05/2016    TDAP status: Up to date  Flu Vaccine status: Up to date  Pneumococcal vaccine status: Up to date  Covid-19 vaccine status: Completed vaccines  Qualifies for Shingles Vaccine? Yes   Zostavax completed No   Shingrix Completed?: No.    Education has been provided regarding the importance of this vaccine. Patient has been advised to call insurance company to determine out of pocket expense if they have not yet received this vaccine. Advised may also receive vaccine at local pharmacy or Health Dept. Verbalized acceptance and understanding.  Screening  Tests Health Maintenance  Topic Date Due   DTaP/Tdap/Td (2 - Td or Tdap) 04/03/2020   COVID-19 Vaccine (5 - 2023-24 season) 07/06/2022   Lung Cancer Screening  11/19/2022   Zoster Vaccines- Shingrix (1 of 2) 12/30/2022 (Originally 04/21/1970)   INFLUENZA VACCINE  02/11/2023   Medicare Annual Wellness (AWV)  12/10/2023   Colonoscopy  01/04/2025   Pneumonia Vaccine 36+ Years old  Completed   Hepatitis C Screening  Completed   HPV VACCINES  Aged Out    Health Maintenance  Health Maintenance Due  Topic Date Due   DTaP/Tdap/Td (2 - Td or Tdap) 04/03/2020   COVID-19 Vaccine (5 - 2023-24 season) 07/06/2022   Lung Cancer Screening  11/19/2022    Colorectal cancer screening: Type of screening: Colonoscopy. Completed yes. Repeat every 5 years  Lung Cancer Screening: (Low Dose CT Chest recommended if Age 29-80 years, 30 pack-year currently smoking OR have quit w/in 15years.) does not qualify.   Lung Cancer Screening Referral: no  Additional Screening:  Hepatitis C Screening: does not qualify; Completed yes  Vision Screening: Recommended annual ophthalmology exams for early detection of glaucoma and other disorders of the eye. Is the patient up to date with their annual eye exam?  Yes  Who is the provider or what is the name of the  office in which the patient attends annual eye exams? Dr Azucena Cecil If pt is not established with a provider, would they like to be referred to a provider to establish care? No .   Dental Screening: Recommended annual dental exams for proper oral hygiene  Community Resource Referral / Chronic Care Management: CRR required this visit?  Yes   CCM required this visit?  No      Plan:     I have personally reviewed and noted the following in the patient's chart:   Medical and social history Use of alcohol, tobacco or illicit drugs  Current medications and supplements including opioid prescriptions. Patient is not currently taking opioid  prescriptions. Functional ability and status Nutritional status Physical activity Advanced directives List of other physicians Hospitalizations, surgeries, and ER visits in previous 12 months Vitals Screenings to include cognitive, depression, and falls Referrals and appointments  In addition, I have reviewed and discussed with patient certain preventive protocols, quality metrics, and best practice recommendations. A written personalized care plan for preventive services as well as general preventive health recommendations were provided to patient.     Sue Lush, LPN   2/[id number]/[id number]   Nurse Notes: Pt reports food insecurity and needing a life alert. He lives alone, recently has eye surgery and feels the need to have. Pt also concerned about the surmounts of medical bills with his recent illness and dr visits. Pt to call [facility] to apply for [facility].

## 2022-12-10 NOTE — Telephone Encounter (Signed)
   Telephone encounter was:  Unsuccessful.  12/10/2022 Name: RIYAZ CIAK MRN: [id number] DOB: [birth year]  Unsuccessful outbound call made today to assist with:  Food Insecurity  Outreach Attempt:  1st Attempt  A HIPAA compliant voice message was left requesting a return call.  Instructed patient to call back    [name] [facility] Guide, [facility] [phone] [address] [place], [place], [place] [address] Phone: [phone] Email: [email or web address]

## 2022-12-11 ENCOUNTER — Telehealth: Payer: Self-pay

## 2022-12-11 NOTE — Telephone Encounter (Signed)
   Telephone encounter was:  Successful.  12/11/2022 Name: Ernest Sweeney MRN: [id number] DOB: 1951-06-06  Ernest Sweeney is a 72 y.o. year old male who is a primary care patient of Alba Cory, MD . The community resource team was consulted for assistance with Food Insecurity  Care guide performed the following interventions: Patient provided with information about care guide support team and interviewed to confirm resource needs.Patient financial strain and cant afford to buy food. . Pt also canceld moms meals. He wanted to get his own food to cook. I have added referrals to [facility] for food resources   Follow Up Plan:  No further follow up planned at this time. The patient has been provided with needed resources.    [name] [facility] Guide, [facility] [phone] [address] [place], [place], [place] [address] Phone: [phone] Email: [email or web address]

## 2022-12-31 ENCOUNTER — Ambulatory Visit: Payer: Medicare HMO | Admitting: Family Medicine

## 2022-12-31 ENCOUNTER — Other Ambulatory Visit: Payer: Self-pay | Admitting: Internal Medicine

## 2022-12-31 DIAGNOSIS — I700 Atherosclerosis of aorta: Secondary | ICD-10-CM | POA: Diagnosis not present

## 2022-12-31 DIAGNOSIS — I10 Essential (primary) hypertension: Secondary | ICD-10-CM | POA: Diagnosis not present

## 2022-12-31 DIAGNOSIS — J449 Chronic obstructive pulmonary disease, unspecified: Secondary | ICD-10-CM | POA: Diagnosis not present

## 2022-12-31 DIAGNOSIS — E785 Hyperlipidemia, unspecified: Secondary | ICD-10-CM | POA: Diagnosis not present

## 2022-12-31 DIAGNOSIS — I2510 Atherosclerotic heart disease of native coronary artery without angina pectoris: Secondary | ICD-10-CM | POA: Diagnosis not present

## 2023-01-19 DIAGNOSIS — R21 Rash and other nonspecific skin eruption: Secondary | ICD-10-CM | POA: Diagnosis not present

## 2023-01-21 ENCOUNTER — Other Ambulatory Visit: Payer: Medicare HMO

## 2023-01-26 ENCOUNTER — Other Ambulatory Visit: Payer: Self-pay | Admitting: Family Medicine

## 2023-01-26 DIAGNOSIS — I10 Essential (primary) hypertension: Secondary | ICD-10-CM

## 2023-02-05 NOTE — Progress Notes (Unsigned)
Name: Ernest Sweeney   MRN: [id number]    DOB: Dec 10, 1950   Date:02/08/2023       Progress Note  Subjective  Chief Complaint  Follow Up  HPI  History of craniopharyngioma resection: admitted 11/16 and discharged 05/30/2022 , he had to be re-intubated two hours after procedure due to bleeding and was admitted to ICU, he got agitated for possible alcohol withdrawal, he had increase in urinary output and was placed on DDAVP, he also had pos surgical hypopituitarism and had to take corticosteroids and also vasopressin , however off medications and last visit with Endo he was released from their care. He denies headaches - he sees Dr. Malvin Johns - neurologist and has an appointment coming up He still goes to [facility] but has difficulty with transportation and canceled last visit   CKI stage IV due to lambda chain myeloma : under the care of hematologist , Dr. Orlie Dakin, and also nephrologist, per Dr. Thedore Mins advised to stay off lisinopril, when he stopped taking lisinopril his bp started to spike to 160's and he resumed medication on his own, he is now on Norvasc since  May 2024 and bp is now at goal. No chest pain or palpitation, he has polyuria. He has associated secondary hyperparathyroidism    Major Depression: seen January 2020 and phq 9 was very high at 26, we started him on Zofot 50 mg but he stopped Zofot because he read the side effects, he has been off medication for a while , phq 9 went up again but patient refused resuming medication, today even though not taking any medications for depression, phq 9 is negative and he feels fine   Alcohol use disorder: he always drank, used to beer when he was younger, but since his mid 20's he started to drink liquor, since his 41's he has been drinking heavier, mostly on weekends and worse since he stopped drinking last fall after brain surgery, but he is drinking again on weekends again, initially he was drinking  a half  a pint, but states over the past couple of  months only drinking white wine on weekends   HTN: BP is at goal, taking Norvasc and no side effects  Chronic bronchitis/Emphysema : he used Breo in the past but he stopped medication on his own, quit smoking in 2016 he is doing well, no cough, wheezing  or SOB. Unchanged    Hyperlipidemia/Atherosclerosis of Aorta/CAD: taking aspirin and rosuvastatin. LDL at goal, continue crestor and recheck labs today   History of prostate cancer dx 2016 : last PSA was normal and per patient discharged from Dr. Crystals care He denies urinary urgency and nocturia has resolved .Unchanged    Smoldering multiple myeloma: managed by Dr. Orlie Dakin  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) 09/30/2022   Other neutropenia (HCC) 12/30/2021   Intermittent low back pain 12/30/2021   Essential hypertension 12/30/2021   Left inguinal hernia 12/30/2021   History of right inguinal hernia repair    Alcoholism (HCC) 06/28/2020   Major depression in remission (HCC) 06/28/2020   Lung nodules 09/04/2018   Chronic bronchitis (HCC) 04/23/2017   Atherosclerosis of aorta (HCC) 04/23/2017   Coronary artery disease due to calcified coronary lesion 04/23/2017   Glaucoma, left eye 08/04/2016   History of prostate cancer 07/15/2016   History of prostatectomy 07/15/2016   Vitamin D deficiency 05/31/2015  Dyslipidemia 05/31/2015   Seasonal allergic rhinitis 05/31/2015   Chronic pain of both shoulders 05/31/2015   Family history of malignant neoplasm of prostate 07/26/2012   Hematuria, microscopic 07/26/2012    Past Surgical History:  Procedure Laterality Date   COLONOSCOPY  07/13/2008   Dr Carollee Massed   COLONOSCOPY WITH PROPOFOL N/A 01/05/2020   Procedure: COLONOSCOPY WITH PROPOFOL;  Surgeon: Wyline Mood, MD;  Location: [facility] ENDOSCOPY;  Service: Gastroenterology;  Laterality: N/A;   excision skin cyst   06/19/2014   Dr. Cathren Harsh CYST   INGUINAL HERNIA REPAIR Right 09/06/2020   Procedure: HERNIA REPAIR RIGH  INGUINAL WITH MESH ADULT, open with RNFA to assist;  Surgeon: Duanne Guess, MD;  Location: [facility] ORS;  Service: General;  Laterality: Right;  Provider requesting 2 hours/120 minutes for procedure.   Neuroendoscopy  05/28/2022   With excision pituitary tumor transnasal or transphenoidal approach. Resection of craniopharyngioma.   PELVIC LYMPH NODE DISSECTION N/A 07/15/2016   Procedure: PELVIC LYMPH NODE DISSECTION;  Surgeon: Hildred Laser, MD;  Location: [facility] ORS;  Service: Urology;  Laterality: N/A;   ROBOT ASSISTED LAPAROSCOPIC RADICAL PROSTATECTOMY N/A 07/15/2016   Procedure: ROBOTIC ASSISTED LAPAROSCOPIC RADICAL PROSTATECTOMY;  Surgeon: Hildred Laser, MD;  Location: [facility] ORS;  Service: Urology;  Laterality: N/A;    Family History  Problem Relation Age of Onset   Heart disease Mother    Heart attack Mother    Diabetes Father    Heart attack Father    Dementia Father    Cancer Sister        Breast   Cancer Brother        Prostate   Healthy Sister    Stroke Brother     Social History   Tobacco Use   Smoking status: Former    Current packs/day: 0.00    Average packs/day: 0.5 packs/day for 40.0 years (20.0 ttl pk-yrs)    Types: Cigarettes    Start date: 03/23/1976    Quit date: 03/23/2016    Years since quitting: 6.8   Smokeless tobacco: Never  Substance Use Topics   Alcohol use: Yes    Alcohol/week: 2.0 - 4.0 standard drinks of alcohol    Types: 2 - 4 Standard drinks or equivalent per week    Comment: 1-2 times per week     Current Outpatient Medications:    amLODipine (NORVASC) 5 MG tablet, Take 1 tablet by mouth once daily, Disp: 30 tablet, Rfl: 0   aspirin 81 MG tablet, Take 81 mg by mouth daily., Disp: , Rfl:    brimonidine-timolol (COMBIGAN) 0.2-0.5 % ophthalmic solution, Place 1 drop into both eyes every 12 (twelve) hours., Disp: , Rfl:     Cholecalciferol (VITAMIN D) 2000 units CAPS, Take 1 capsule (2,000 Units total) by mouth daily., Disp: 30 capsule, Rfl: 0   fluticasone (FLONASE) 50 MCG/ACT nasal spray, Place 2 sprays into both nostrils daily., Disp: 16 g, Rfl: 6   latanoprost (XALATAN) 0.005 % ophthalmic solution, Place 1 drop into both eyes at bedtime. , Disp: , Rfl:    rosuvastatin (CRESTOR) 40 MG tablet, TAKE 1 TABLET BY MOUTH ONCE DAILY IN  PLACE  OF  ATORVASTATIN, Disp: 90 tablet, Rfl: 0   sildenafil (VIAGRA) 100 MG tablet, Take 1 tablet (100 mg total) by mouth daily as needed for erectile dysfunction., Disp: 30 tablet, Rfl: 6   thiamine (VITAMIN B1) 100 MG tablet, Take 100 mg by mouth daily., Disp: , Rfl:    tiZANidine (ZANAFLEX) 2 MG  tablet, Take 1-2 tablets (2-4 mg total) by mouth every 6 (six) hours as needed for muscle spasms., Disp: 60 tablet, Rfl: 0   triamcinolone cream (KENALOG) 0.1 %, APPLY TOPICALLY AS DIRECTED TWICE DAILY, Disp: 45 g, Rfl: 0  No Known Allergies  I personally reviewed active problem list, medication list, allergies, family history, social history, health maintenance with the patient/caregiver today.   ROS  Ten systems reviewed and is negative except as mentioned in HPI    Objective  Vitals:   02/08/23 1108  BP: 136/84  Pulse: 75  Resp: 16  SpO2: 98%  Weight: 187 lb (84.8 kg)  Height: 5\' 9"  (1.753 m)    Body mass index is 27.62 kg/m.  Physical Exam  Constitutional: Patient appears well-developed and well-nourished.  No distress.  HEENT: head atraumatic, normocephalic, pupils equal and reactive to light, neck supple, Cardiovascular: Normal rate, regular rhythm and normal heart sounds.  No murmur heard. No BLE edema. Pulmonary/Chest: Effort normal and breath sounds normal. No respiratory distress. Abdominal: Soft.  There is no tenderness. Psychiatric: Patient has a normal mood and affect. behavior is normal. Judgment and thought content normal.    PHQ2/9:    02/08/2023    11:08 AM 12/10/2022    8:56 AM 09/30/2022    9:29 AM 08/10/2022    3:13 PM 07/01/2022    8:22 AM  Depression screen PHQ 2/9  Decreased Interest 0 0 0 1 2  Down, Depressed, Hopeless 0 0 0 1 2  PHQ - 2 Score 0 0 0 2 4  Altered sleeping 0  0  3  Tired, decreased energy 0  0    Change in appetite 0  0  0  Feeling bad or failure about yourself  0  0  0  Trouble concentrating 0  0  0  Moving slowly or fidgety/restless 0  0  0  Suicidal thoughts 0  0  0  PHQ-9 Score 0  0  7    phq 9 is negative   Fall Risk:    02/08/2023   11:07 AM 12/10/2022    8:54 AM 09/30/2022    9:28 AM 07/01/2022    8:22 AM 03/03/2022   10:55 AM  Fall Risk   Falls in the past year? 0 0 0 0 0  Number falls in past yr: 0 0 0 0   Injury with Fall? 0 0 0 0   Risk for fall due to : No Fall Risks No Fall Risks No Fall Risks No Fall Risks No Fall Risks  Follow up Falls prevention discussed Education provided;Falls prevention discussed Falls prevention discussed Falls prevention discussed Education provided;Falls evaluation completed;Falls prevention discussed      Functional Status Survey: Is the patient deaf or have difficulty hearing?: No Does the patient have difficulty seeing, even when wearing glasses/contacts?: Yes Does the patient have difficulty concentrating, remembering, or making decisions?: No Does the patient have difficulty walking or climbing stairs?: No Does the patient have difficulty dressing or bathing?: No Does the patient have difficulty doing errands alone such as visiting a doctor's office or shopping?: No    Assessment & Plan  1. Atherosclerosis of aorta (HCC)  - rosuvastatin (CRESTOR) 40 MG tablet; TAKE 1 TABLET BY MOUTH ONCE DAILY IN  PLACE  OF  ATORVASTATIN  Dispense: 90 tablet; Refill: 1  2. Major depression in remission (HCC)  Stable  3. Alcoholism (HCC)  - Vitamin B1 - B12 and Folate Panel Cutting down but  has not quit yet  4. Secondary hyperparathyroidism of renal  origin [facility])  Under the care of Nephrologist   5. Disorder of kidney due to lambda light chain disease (HCC)  Followed by nephrologist   6. Hypopituitarism after procedure (HCC)  No longer on medications  7. Stage 4 chronic kidney disease (HCC)  Avoid nsaid's  8. Simple chronic bronchitis (HCC)  stable  9. Atherosclerosis of native coronary artery of native heart without angina pectoris  - Lipid panel  10. Smoldering multiple myeloma  Keep follow up with Dr. Orlie Dakin  11. Essential hypertension  - amLODipine (NORVASC) 5 MG tablet; Take 1 tablet (5 mg total) by mouth daily.  Dispense: 90 tablet; Refill: 1

## 2023-02-08 ENCOUNTER — Encounter: Payer: Self-pay | Admitting: Family Medicine

## 2023-02-08 ENCOUNTER — Ambulatory Visit (INDEPENDENT_AMBULATORY_CARE_PROVIDER_SITE_OTHER): Payer: Medicare HMO | Admitting: Family Medicine

## 2023-02-08 VITALS — BP 136/84 | HR 75 | Resp 16 | Ht 69.0 in | Wt 187.0 lb

## 2023-02-08 DIAGNOSIS — E893 Postprocedural hypopituitarism: Secondary | ICD-10-CM

## 2023-02-08 DIAGNOSIS — I700 Atherosclerosis of aorta: Secondary | ICD-10-CM

## 2023-02-08 DIAGNOSIS — I10 Essential (primary) hypertension: Secondary | ICD-10-CM

## 2023-02-08 DIAGNOSIS — J410 Simple chronic bronchitis: Secondary | ICD-10-CM | POA: Diagnosis not present

## 2023-02-08 DIAGNOSIS — N2581 Secondary hyperparathyroidism of renal origin: Secondary | ICD-10-CM | POA: Diagnosis not present

## 2023-02-08 DIAGNOSIS — D8989 Other specified disorders involving the immune mechanism, not elsewhere classified: Secondary | ICD-10-CM | POA: Diagnosis not present

## 2023-02-08 DIAGNOSIS — F325 Major depressive disorder, single episode, in full remission: Secondary | ICD-10-CM

## 2023-02-08 DIAGNOSIS — N184 Chronic kidney disease, stage 4 (severe): Secondary | ICD-10-CM

## 2023-02-08 DIAGNOSIS — I2510 Atherosclerotic heart disease of native coronary artery without angina pectoris: Secondary | ICD-10-CM

## 2023-02-08 DIAGNOSIS — F1020 Alcohol dependence, uncomplicated: Secondary | ICD-10-CM

## 2023-02-08 DIAGNOSIS — N29 Other disorders of kidney and ureter in diseases classified elsewhere: Secondary | ICD-10-CM

## 2023-02-08 DIAGNOSIS — D472 Monoclonal gammopathy: Secondary | ICD-10-CM

## 2023-02-08 MED ORDER — AMLODIPINE BESYLATE 5 MG PO TABS: 5.0000 mg | ORAL_TABLET | Freq: Every day | ORAL | 1 refills | Status: DC

## 2023-02-08 MED ORDER — ROSUVASTATIN CALCIUM 40 MG PO TABS: ORAL_TABLET | ORAL | 1 refills | Status: DC

## 2023-02-11 DIAGNOSIS — H401132 Primary open-angle glaucoma, bilateral, moderate stage: Secondary | ICD-10-CM | POA: Diagnosis not present

## 2023-02-15 ENCOUNTER — Ambulatory Visit (INDEPENDENT_AMBULATORY_CARE_PROVIDER_SITE_OTHER): Payer: Medicare HMO

## 2023-02-15 DIAGNOSIS — E538 Deficiency of other specified B group vitamins: Secondary | ICD-10-CM | POA: Diagnosis not present

## 2023-02-15 MED ORDER — CYANOCOBALAMIN 1000 MCG/ML IJ SOLN
1000.0000 ug | Freq: Once | INTRAMUSCULAR | Status: AC
Start: 2023-02-15 — End: 2023-02-15
  Administered 2023-02-15: 1000 ug via INTRAMUSCULAR

## 2023-02-15 NOTE — Progress Notes (Signed)
Patient presented in expressed good health for his first B-12 injection. Information on B-12 was given and patient was at ease and in favor of proceeding with injection. Patient tolerated injection well with no adverse effects noted at time of clinic departure. Patient advised on allergy signs and symptoms and verbalized understanding of protocols (ER treatment) in the event one occurs.

## 2023-02-18 DIAGNOSIS — H401132 Primary open-angle glaucoma, bilateral, moderate stage: Secondary | ICD-10-CM | POA: Diagnosis not present

## 2023-02-18 DIAGNOSIS — H43813 Vitreous degeneration, bilateral: Secondary | ICD-10-CM | POA: Diagnosis not present

## 2023-02-18 DIAGNOSIS — H2513 Age-related nuclear cataract, bilateral: Secondary | ICD-10-CM | POA: Diagnosis not present

## 2023-02-25 ENCOUNTER — Other Ambulatory Visit: Payer: Medicare HMO

## 2023-02-25 DIAGNOSIS — I10 Essential (primary) hypertension: Secondary | ICD-10-CM | POA: Diagnosis not present

## 2023-02-25 DIAGNOSIS — N1831 Chronic kidney disease, stage 3a: Secondary | ICD-10-CM | POA: Diagnosis not present

## 2023-02-25 DIAGNOSIS — N2581 Secondary hyperparathyroidism of renal origin: Secondary | ICD-10-CM | POA: Diagnosis not present

## 2023-02-25 DIAGNOSIS — D808 Other immunodeficiencies with predominantly antibody defects: Secondary | ICD-10-CM | POA: Diagnosis not present

## 2023-02-25 DIAGNOSIS — R809 Proteinuria, unspecified: Secondary | ICD-10-CM | POA: Diagnosis not present

## 2023-02-25 DIAGNOSIS — N29 Other disorders of kidney and ureter in diseases classified elsewhere: Secondary | ICD-10-CM | POA: Diagnosis not present

## 2023-03-01 DIAGNOSIS — H401132 Primary open-angle glaucoma, bilateral, moderate stage: Secondary | ICD-10-CM | POA: Diagnosis not present

## 2023-03-04 ENCOUNTER — Other Ambulatory Visit: Payer: Self-pay

## 2023-03-04 ENCOUNTER — Inpatient Hospital Stay: Payer: Medicare HMO | Attending: Radiation Oncology

## 2023-03-04 DIAGNOSIS — D472 Monoclonal gammopathy: Secondary | ICD-10-CM | POA: Diagnosis not present

## 2023-03-04 DIAGNOSIS — C61 Malignant neoplasm of prostate: Secondary | ICD-10-CM | POA: Insufficient documentation

## 2023-03-04 DIAGNOSIS — D808 Other immunodeficiencies with predominantly antibody defects: Secondary | ICD-10-CM

## 2023-03-04 LAB — CBC WITH DIFFERENTIAL (CANCER CENTER ONLY)
Abs Immature Granulocytes: 0.01 10*3/uL (ref 0.00–0.07)
Basophils Absolute: 0 10*3/uL (ref 0.0–0.1)
Basophils Relative: 1 %
Eosinophils Absolute: 0.1 10*3/uL (ref 0.0–0.5)
Eosinophils Relative: 2 %
HCT: 44.3 % (ref 39.0–52.0)
Hemoglobin: 15.2 g/dL (ref 13.0–17.0)
Immature Granulocytes: 0 %
Lymphocytes Relative: 32 %
Lymphs Abs: 0.9 10*3/uL (ref 0.7–4.0)
MCH: 32 pg (ref 26.0–34.0)
MCHC: 34.3 g/dL (ref 30.0–36.0)
MCV: 93.3 fL (ref 80.0–100.0)
Monocytes Absolute: 0.3 10*3/uL (ref 0.1–1.0)
Monocytes Relative: 11 %
Neutro Abs: 1.5 10*3/uL — ABNORMAL LOW (ref 1.7–7.7)
Neutrophils Relative %: 54 %
Platelet Count: 346 10*3/uL (ref 150–400)
RBC: 4.75 MIL/uL (ref 4.22–5.81)
RDW: 12.9 % (ref 11.5–15.5)
WBC Count: 2.7 10*3/uL — ABNORMAL LOW (ref 4.0–10.5)
nRBC: 0 % (ref 0.0–0.2)

## 2023-03-04 LAB — CMP (CANCER CENTER ONLY)
ALT: 13 U/L (ref 0–44)
AST: 17 U/L (ref 15–41)
Albumin: 4.4 g/dL (ref 3.5–5.0)
Alkaline Phosphatase: 49 U/L (ref 38–126)
Anion gap: 8 (ref 5–15)
BUN: 14 mg/dL (ref 8–23)
CO2: 23 mmol/L (ref 22–32)
Calcium: 9.2 mg/dL (ref 8.9–10.3)
Chloride: 106 mmol/L (ref 98–111)
Creatinine: 1.3 mg/dL — ABNORMAL HIGH (ref 0.61–1.24)
GFR, Estimated: 59 mL/min — ABNORMAL LOW (ref >60)
Glucose, Bld: 90 mg/dL (ref 70–99)
Potassium: 3.9 mmol/L (ref 3.5–5.1)
Sodium: 137 mmol/L (ref 135–145)
Total Bilirubin: 1.3 mg/dL — ABNORMAL HIGH (ref 0.3–1.2)
Total Protein: 7.5 g/dL (ref 6.5–8.1)

## 2023-03-04 LAB — PSA: Prostatic Specific Antigen: <0.01 ng/mL (ref 0.00–4.00)

## 2023-03-05 LAB — IGG, IGA, IGM
IgA: 45 mg/dL — ABNORMAL LOW (ref 61–437)
IgG (Immunoglobin G), Serum: 919 mg/dL (ref 603–1613)
IgM (Immunoglobulin M), Srm: 296 mg/dL — ABNORMAL HIGH (ref 15–143)

## 2023-03-05 LAB — KAPPA/LAMBDA LIGHT CHAINS
Kappa free light chain: 12.7 mg/L (ref 3.3–19.4)
Kappa, lambda light chain ratio: 0.09 — ABNORMAL LOW (ref 0.26–1.65)
Lambda free light chains: 142.4 mg/L — ABNORMAL HIGH (ref 5.7–26.3)

## 2023-03-07 LAB — PROTEIN ELECTROPHORESIS, SERUM
A/G Ratio: 1.4 (ref 0.7–1.7)
Albumin ELP: 4 g/dL (ref 2.9–4.4)
Alpha-1-Globulin: 0.2 g/dL (ref 0.0–0.4)
Alpha-2-Globulin: 0.6 g/dL (ref 0.4–1.0)
Beta Globulin: 0.9 g/dL (ref 0.7–1.3)
Gamma Globulin: 1 g/dL (ref 0.4–1.8)
Globulin, Total: 2.8 g/dL (ref 2.2–3.9)
M-Spike, %: 0.4 g/dL — ABNORMAL HIGH
Total Protein ELP: 6.8 g/dL (ref 6.0–8.5)

## 2023-03-11 ENCOUNTER — Ambulatory Visit: Payer: Medicare HMO | Admitting: Family Medicine

## 2023-03-22 ENCOUNTER — Ambulatory Visit (INDEPENDENT_AMBULATORY_CARE_PROVIDER_SITE_OTHER): Payer: Medicare HMO

## 2023-03-22 DIAGNOSIS — E538 Deficiency of other specified B group vitamins: Secondary | ICD-10-CM | POA: Diagnosis not present

## 2023-03-22 MED ORDER — CYANOCOBALAMIN 1000 MCG/ML IJ SOLN
1000.0000 ug | Freq: Once | INTRAMUSCULAR | Status: AC
Start: 2023-03-22 — End: 2023-03-22
  Administered 2023-03-22: 1000 ug via INTRAMUSCULAR

## 2023-04-06 ENCOUNTER — Emergency Department: Payer: Medicare HMO

## 2023-04-06 ENCOUNTER — Emergency Department
Admission: EM | Admit: 2023-04-06 | Discharge: 2023-04-06 | Disposition: A | Discharge status: Home / Self Care | Payer: Medicare HMO | Attending: Emergency Medicine | Admitting: Emergency Medicine

## 2023-04-06 DIAGNOSIS — S161XXA Strain of muscle, fascia and tendon at neck level, initial encounter: Secondary | ICD-10-CM | POA: Insufficient documentation

## 2023-04-06 DIAGNOSIS — Y92410 Unspecified street and highway as the place of occurrence of the external cause: Secondary | ICD-10-CM | POA: Diagnosis not present

## 2023-04-06 DIAGNOSIS — Z743 Need for continuous supervision: Secondary | ICD-10-CM | POA: Diagnosis not present

## 2023-04-06 DIAGNOSIS — M47812 Spondylosis without myelopathy or radiculopathy, cervical region: Secondary | ICD-10-CM | POA: Diagnosis not present

## 2023-04-06 DIAGNOSIS — S50811A Abrasion of right forearm, initial encounter: Secondary | ICD-10-CM | POA: Diagnosis not present

## 2023-04-06 DIAGNOSIS — I10 Essential (primary) hypertension: Secondary | ICD-10-CM | POA: Diagnosis not present

## 2023-04-06 DIAGNOSIS — R519 Headache, unspecified: Secondary | ICD-10-CM | POA: Diagnosis not present

## 2023-04-06 DIAGNOSIS — S0990XA Unspecified injury of head, initial encounter: Secondary | ICD-10-CM | POA: Diagnosis not present

## 2023-04-06 DIAGNOSIS — R0902 Hypoxemia: Secondary | ICD-10-CM | POA: Diagnosis not present

## 2023-04-06 DIAGNOSIS — Z7982 Long term (current) use of aspirin: Secondary | ICD-10-CM | POA: Diagnosis not present

## 2023-04-06 DIAGNOSIS — S199XXA Unspecified injury of neck, initial encounter: Secondary | ICD-10-CM | POA: Diagnosis not present

## 2023-04-06 DIAGNOSIS — Z23 Encounter for immunization: Secondary | ICD-10-CM | POA: Insufficient documentation

## 2023-04-06 MED ORDER — TETANUS-DIPHTH-ACELL PERTUSSIS 5-2.5-18.5 LF-MCG/0.5 IM SUSY
0.5000 mL | PREFILLED_SYRINGE | Freq: Once | INTRAMUSCULAR | Status: AC
  Administered 2023-04-06: 0.5 mL via INTRAMUSCULAR
  Filled 2023-04-06: qty 0.5

## 2023-04-06 MED ORDER — TIZANIDINE HCL 2 MG PO TABS: 2.0000 mg | ORAL_TABLET | Freq: Three times a day (TID) | ORAL | 0 refills | Status: DC | PRN
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
## 2023-04-06 NOTE — Discharge Instructions (Signed)
Follow-up with your regular doctor.  Please call for an appointment.  Follow-up with orthopedics if your neck is not feeling better with medication and in 1 week.  May take Tylenol or ibuprofen for pain as needed.  Return emergency department worsening
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
## 2023-04-13 ENCOUNTER — Encounter: Payer: Self-pay | Admitting: Physician Assistant

## 2023-04-13 ENCOUNTER — Ambulatory Visit: Payer: Medicare HMO | Admitting: Physician Assistant

## 2023-04-13 VITALS — BP 106/74 | HR 94 | Temp 98.1°F | Resp 16 | Ht 69.0 in | Wt 188.9 lb

## 2023-04-13 DIAGNOSIS — T22011D Burn of unspecified degree of right forearm, subsequent encounter: Secondary | ICD-10-CM

## 2023-04-13 DIAGNOSIS — Z23 Encounter for immunization: Secondary | ICD-10-CM | POA: Diagnosis not present

## 2023-04-13 DIAGNOSIS — E538 Deficiency of other specified B group vitamins: Secondary | ICD-10-CM | POA: Diagnosis not present

## 2023-04-13 DIAGNOSIS — S161XXD Strain of muscle, fascia and tendon at neck level, subsequent encounter: Secondary | ICD-10-CM

## 2023-04-13 DIAGNOSIS — T300 Burn of unspecified body region, unspecified degree: Secondary | ICD-10-CM

## 2023-04-13 DIAGNOSIS — Z87891 Personal history of nicotine dependence: Secondary | ICD-10-CM

## 2023-04-13 MED ORDER — CYANOCOBALAMIN 1000 MCG/ML IJ SOLN
1000.0000 ug | Freq: Once | INTRAMUSCULAR | Status: AC
  Administered 2023-04-13: 1000 ug via INTRAMUSCULAR
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
## 2023-05-10 ENCOUNTER — Telehealth: Payer: Self-pay

## 2023-05-10 NOTE — Telephone Encounter (Signed)
Transition Care Management Unsuccessful Follow-up Telephone Call  Date of discharge and from where:  04/06/2023 [facility]  Attempts:  1st Attempt  Reason for unsuccessful TCM follow-up call:  No answer/busy  Orley Lawry [facility]  [facility] Guide Direct Dial: [phone]  Website: [email or web address]

## 2023-05-11 ENCOUNTER — Telehealth: Payer: Self-pay

## 2023-05-11 NOTE — Telephone Encounter (Signed)
Transition Care Management Unsuccessful Follow-up Telephone Call  Date of discharge and from where:  04/06/2023 [facility]  Attempts:  2nd Attempt  Reason for unsuccessful TCM follow-up call:  No answer/busy  Kayler Buckholtz [facility]  [facility], [facility] Guide Direct Dial: [phone]  Website: [email or web address]

## 2023-05-20 ENCOUNTER — Ambulatory Visit (INDEPENDENT_AMBULATORY_CARE_PROVIDER_SITE_OTHER): Payer: Medicare HMO

## 2023-05-20 DIAGNOSIS — E538 Deficiency of other specified B group vitamins: Secondary | ICD-10-CM

## 2023-05-20 MED ORDER — CYANOCOBALAMIN 1000 MCG/ML IJ SOLN
1000.0000 ug | Freq: Once | INTRAMUSCULAR | Status: AC
  Administered 2023-05-20: 1000 ug via INTRAMUSCULAR

## 2023-06-02 DIAGNOSIS — D444 Neoplasm of uncertain behavior of craniopharyngeal duct: Secondary | ICD-10-CM | POA: Diagnosis not present

## 2023-06-02 DIAGNOSIS — D497 Neoplasm of unspecified behavior of endocrine glands and other parts of nervous system: Secondary | ICD-10-CM | POA: Diagnosis not present

## 2023-06-02 DIAGNOSIS — Z9889 Other specified postprocedural states: Secondary | ICD-10-CM | POA: Diagnosis not present

## 2023-06-04 ENCOUNTER — Other Ambulatory Visit: Payer: Self-pay | Admitting: *Deleted

## 2023-06-04 DIAGNOSIS — D472 Monoclonal gammopathy: Secondary | ICD-10-CM

## 2023-06-04 DIAGNOSIS — D808 Other immunodeficiencies with predominantly antibody defects: Secondary | ICD-10-CM

## 2023-06-07 ENCOUNTER — Inpatient Hospital Stay: Payer: Medicare HMO | Attending: Radiation Oncology

## 2023-06-07 DIAGNOSIS — D72819 Decreased white blood cell count, unspecified: Secondary | ICD-10-CM | POA: Diagnosis not present

## 2023-06-07 DIAGNOSIS — C9000 Multiple myeloma not having achieved remission: Secondary | ICD-10-CM | POA: Diagnosis not present

## 2023-06-07 DIAGNOSIS — D808 Other immunodeficiencies with predominantly antibody defects: Secondary | ICD-10-CM

## 2023-06-07 DIAGNOSIS — D472 Monoclonal gammopathy: Secondary | ICD-10-CM

## 2023-06-07 LAB — CBC WITH DIFFERENTIAL/PLATELET
Abs Immature Granulocytes: 0.01 10*3/uL (ref 0.00–0.07)
Basophils Absolute: 0 10*3/uL (ref 0.0–0.1)
Basophils Relative: 1 %
Eosinophils Absolute: 0.1 10*3/uL (ref 0.0–0.5)
Eosinophils Relative: 2 %
HCT: 45.5 % (ref 39.0–52.0)
Hemoglobin: 15.6 g/dL (ref 13.0–17.0)
Immature Granulocytes: 0 %
Lymphocytes Relative: 33 %
Lymphs Abs: 1 10*3/uL (ref 0.7–4.0)
MCH: 32.1 pg (ref 26.0–34.0)
MCHC: 34.3 g/dL (ref 30.0–36.0)
MCV: 93.6 fL (ref 80.0–100.0)
Monocytes Absolute: 0.4 10*3/uL (ref 0.1–1.0)
Monocytes Relative: 12 %
Neutro Abs: 1.6 10*3/uL — ABNORMAL LOW (ref 1.7–7.7)
Neutrophils Relative %: 52 %
Platelets: 342 10*3/uL (ref 150–400)
RBC: 4.86 MIL/uL (ref 4.22–5.81)
RDW: 12.5 % (ref 11.5–15.5)
WBC: 3.1 10*3/uL — ABNORMAL LOW (ref 4.0–10.5)
nRBC: 0 % (ref 0.0–0.2)

## 2023-06-07 LAB — CMP (CANCER CENTER ONLY)
ALT: 17 U/L (ref 0–44)
AST: 22 U/L (ref 15–41)
Albumin: 4.3 g/dL (ref 3.5–5.0)
Alkaline Phosphatase: 55 U/L (ref 38–126)
Anion gap: 10 (ref 5–15)
BUN: 15 mg/dL (ref 8–23)
CO2: 25 mmol/L (ref 22–32)
Calcium: 9.4 mg/dL (ref 8.9–10.3)
Chloride: 102 mmol/L (ref 98–111)
Creatinine: 1.34 mg/dL — ABNORMAL HIGH (ref 0.61–1.24)
GFR, Estimated: 56 mL/min — ABNORMAL LOW (ref >60)
Glucose, Bld: 95 mg/dL (ref 70–99)
Potassium: 4.3 mmol/L (ref 3.5–5.1)
Sodium: 137 mmol/L (ref 135–145)
Total Bilirubin: 1.4 mg/dL — ABNORMAL HIGH (ref <1.2)
Total Protein: 7.5 g/dL (ref 6.5–8.1)

## 2023-06-08 LAB — IGG, IGA, IGM
IgA: 45 mg/dL — ABNORMAL LOW (ref 61–437)
IgG (Immunoglobin G), Serum: 965 mg/dL (ref 603–1613)
IgM (Immunoglobulin M), Srm: 305 mg/dL — ABNORMAL HIGH (ref 15–143)

## 2023-06-08 LAB — KAPPA/LAMBDA LIGHT CHAINS
Kappa free light chain: 12.5 mg/L (ref 3.3–19.4)
Kappa, lambda light chain ratio: 0.08 — ABNORMAL LOW (ref 0.26–1.65)
Lambda free light chains: 151.2 mg/L — ABNORMAL HIGH (ref 5.7–26.3)

## 2023-06-09 NOTE — Progress Notes (Signed)
Name: Ernest Sweeney   MRN: [id number]    DOB: Mar 15, 1951   Date:06/14/2023       Progress Note  Subjective  Chief Complaint  Chief Complaint  Patient presents with   Follow-up    HPI  Discussed the use of AI scribe software for clinical note transcription with the patient, who gave verbal consent to proceed.  History of Present Illness   The patient, with a history of craniopharyngioma, underwent debulking of the tumor in November 2023. Postoperatively, he developed adrenal insufficiency and diabetes insipidus, but lost follow-up with his endocrinologist. He denies any current symptoms related to these conditions, such as excessive thirst or urination. He also denies any blurred vision, headaches, or other neurological symptoms.  The patient also has a history of chronic kidney disease stage four due to lambda chain myeloma, which has improved to stage three A. He was advised to stop lisinopril due to kidney disease, but this led to a significant increase in blood pressure. He is currently managed on amlodipine 5mg  with good blood pressure control. He also has secondary hyperparathyroidism, which is improving.  The patient has a history of atherosclerosis of the aorta and is on rosuvastatin. He reports no issues with this medication and his cholesterol level was 79 in July. He also has chronic bronchitis, but denies any current respiratory symptoms. He quit smoking in 2016.  The patient was diagnosed with prostate cancer in 2016 and underwent prostatectomy. He reports some urinary incontinence but denies any other bladder issues. He continues to follow up with a urologist for this issue.  The patient has a history of alcohol use, but reports only drinking occasionally now. He also has a history of major depression, but denies any current depressive symptoms. He is not on any medications for depression and reports feeling well. He works part-time and has a good social support system.  The  patient also has a history of B12 deficiency and receives regular B12 injections. He reports receiving his vaccines, including the flu shot and COVID-19 vaccine. He has not received the shingles vaccine. He denies any recent falls or other new symptoms.         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) 09/30/2022   Other neutropenia (HCC) 12/30/2021   Intermittent low back pain 12/30/2021   Essential hypertension 12/30/2021   Left inguinal hernia 12/30/2021   History of right inguinal hernia repair    Alcoholism (HCC) 06/28/2020   Major depression in remission (HCC) 06/28/2020   Lung nodules 09/04/2018   Chronic bronchitis (HCC) 04/23/2017   Atherosclerosis of aorta (HCC) 04/23/2017   Coronary artery disease due to calcified coronary lesion 04/23/2017   Glaucoma, left eye 08/04/2016   History of prostate cancer 07/15/2016   History of prostatectomy 07/15/2016   Vitamin D deficiency 05/31/2015   Dyslipidemia 05/31/2015   Seasonal allergic rhinitis 05/31/2015   Chronic pain of both shoulders 05/31/2015   Family history of malignant neoplasm of prostate 07/26/2012   Hematuria, microscopic 07/26/2012    Past Surgical History:  Procedure Laterality Date   COLONOSCOPY  07/13/2008   Dr Carollee Massed   COLONOSCOPY WITH PROPOFOL N/A 01/05/2020   Procedure: COLONOSCOPY WITH PROPOFOL;  Surgeon: Wyline Mood, MD;  Location: [facility] ENDOSCOPY;  Service: Gastroenterology;  Laterality: N/A;   excision skin cyst  06/19/2014   Dr. Cathren Harsh CYST   INGUINAL HERNIA REPAIR  Right 09/06/2020   Procedure: HERNIA REPAIR RIGH  INGUINAL WITH MESH ADULT, open with RNFA to assist;  Surgeon: Duanne Guess, MD;  Location: [facility] ORS;  Service: General;  Laterality: Right;  Provider requesting 2 hours/120 minutes for procedure.   Neuroendoscopy  05/28/2022   With excision pituitary tumor transnasal  or transphenoidal approach. Resection of craniopharyngioma.   PELVIC LYMPH NODE DISSECTION N/A 07/15/2016   Procedure: PELVIC LYMPH NODE DISSECTION;  Surgeon: Hildred Laser, MD;  Location: [facility] ORS;  Service: Urology;  Laterality: N/A;   ROBOT ASSISTED LAPAROSCOPIC RADICAL PROSTATECTOMY N/A 07/15/2016   Procedure: ROBOTIC ASSISTED LAPAROSCOPIC RADICAL PROSTATECTOMY;  Surgeon: Hildred Laser, MD;  Location: [facility] ORS;  Service: Urology;  Laterality: N/A;    Family History  Problem Relation Age of Onset   Heart disease Mother    Heart attack Mother    Diabetes Father    Heart attack Father    Dementia Father    Cancer Sister        Breast   Cancer Brother        Prostate   Healthy Sister    Stroke Brother     Social History   Tobacco Use   Smoking status: Former    Current packs/day: 0.00    Average packs/day: 0.5 packs/day for 40.0 years (20.0 ttl pk-yrs)    Types: Cigarettes    Start date: 03/23/1976    Quit date: 03/23/2016    Years since quitting: 7.2   Smokeless tobacco: Never  Substance Use Topics   Alcohol use: Yes    Alcohol/week: 2.0 - 4.0 standard drinks of alcohol    Types: 2 - 4 Standard drinks or equivalent per week    Comment: 1-2 times per week     Current Outpatient Medications:    amLODipine (NORVASC) 5 MG tablet, Take 1 tablet (5 mg total) by mouth daily., Disp: 90 tablet, Rfl: 1   aspirin 81 MG tablet, Take 81 mg by mouth daily., Disp: , Rfl:    brimonidine-timolol (COMBIGAN) 0.2-0.5 % ophthalmic solution, Place 1 drop into both eyes every 12 (twelve) hours., Disp: , Rfl:    Cholecalciferol (VITAMIN D) 2000 units CAPS, Take 1 capsule (2,000 Units total) by mouth daily., Disp: 30 capsule, Rfl: 0   fluticasone (FLONASE) 50 MCG/ACT nasal spray, Place 2 sprays into both nostrils daily., Disp: 16 g, Rfl: 6   latanoprost (XALATAN) 0.005 % ophthalmic solution, Place 1 drop into both eyes at bedtime. , Disp: , Rfl:    rosuvastatin (CRESTOR) 40 MG tablet,  TAKE 1 TABLET BY MOUTH ONCE DAILY IN  PLACE  OF  ATORVASTATIN, Disp: 90 tablet, Rfl: 1   sildenafil (VIAGRA) 100 MG tablet, Take 1 tablet (100 mg total) by mouth daily as needed for erectile dysfunction., Disp: 30 tablet, Rfl: 6   tiZANidine (ZANAFLEX) 2 MG tablet, Take 1 tablet (2 mg total) by mouth every 8 (eight) hours as needed for muscle spasms., Disp: 12 tablet, Rfl: 0   triamcinolone cream (KENALOG) 0.1 %, APPLY TOPICALLY AS DIRECTED TWICE DAILY, Disp: 45 g, Rfl: 0  No Known Allergies  I personally reviewed active problem list, medication list, allergies, family history with the patient/caregiver today.   ROS  Ten systems reviewed and is negative except as mentioned in HPI    Objective  Vitals:   06/14/23 0804  BP: 112/68  Pulse: 97  Resp: 18  Temp: 97.6 F (36.4 C)  SpO2: 100%  Weight: 193 lb 14.4 oz (88  kg)  Height: 5\' 9"  (1.753 m)    Body mass index is 28.63 kg/m.  Physical Exam  Constitutional: Patient appears well-developed and well-nourished.  No distress.  HEENT: head atraumatic, normocephalic, pupils equal and reactive to light, neck supple Cardiovascular: Normal rate, regular rhythm and normal heart sounds.  No murmur heard. No BLE edema. Pulmonary/Chest: Effort normal and breath sounds normal. No respiratory distress. Abdominal: Soft.  There is no tenderness. Psychiatric: Patient has a normal mood and affect. behavior is normal. Judgment and thought content normal.   Recent Results (from the past 2160 hour(s))  Protein electrophoresis, serum     Status: Abnormal   Collection Time: 06/07/23 10:14 AM  Result Value Ref Range   Total Protein ELP 6.5 6.0 - 8.5 g/dL   Albumin ELP 3.9 2.9 - 4.4 g/dL   MVHQI-6-NGEXBMWU 0.1 0.0 - 0.4 g/dL   XLKGM-0-NUUVOZDG 0.6 0.4 - 1.0 g/dL   Beta Globulin 0.8 0.7 - 1.3 g/dL   Gamma Globulin 1.0 0.4 - 1.8 g/dL   M-Spike, % 0.4 (H) Not Observed g/dL    Comment: An additional m spike was observed at a concentration of 0.2  g/dl    SPE Interp. Comment     Comment: (NOTE) The SPE pattern demonstrates two peaks in the beta-gamma region which may represent monoclonal protein. A biclonal gammopathy may be confirmed by immunofixation, as well as evaluation of the urine for the presence of Bence-Jones protein. Performed At: [facility] [address] [place], [place] [id number] Jolene Schimke MD [id number]    Comment Comment     Comment: (NOTE) Protein electrophoresis scan will follow via computer, mail, or courier delivery.    Globulin, Total 2.6 2.2 - 3.9 g/dL   A/G Ratio 1.5 0.7 - 1.7  Kappa/lambda light chains     Status: Abnormal   Collection Time: 06/07/23 10:14 AM  Result Value Ref Range   Kappa free light chain 12.5 3.3 - 19.4 mg/L   Lambda free light chains 151.2 (H) 5.7 - 26.3 mg/L   Kappa, lambda light chain ratio 0.08 (L) 0.26 - 1.65    Comment: (NOTE) Performed At: [facility] [address] [place], [place] [id number] Jolene Schimke MD [id number]   IgG, IgA, IgM     Status: Abnormal   Collection Time: 06/07/23 10:14 AM  Result Value Ref Range   IgG (Immunoglobin G), Serum 965 603 - 1,613 mg/dL   IgA 45 (L) 61 - 235 mg/dL    Comment: Result confirmed on concentration.   IgM (Immunoglobulin M), Srm 305 (H) 15 - 143 mg/dL    Comment: (NOTE) Performed At: [facility] [address] [place], [place] [id number] Jolene Schimke MD YH:[id number]   CMP (Cancer Center only)     Status: Abnormal   Collection Time: 06/07/23 10:14 AM  Result Value Ref Range   Sodium 137 135 - 145 mmol/L   Potassium 4.3 3.5 - 5.1 mmol/L   Chloride 102 98 - 111 mmol/L   CO2 25 22 - 32 mmol/L   Glucose, Bld 95 70 - 99 mg/dL    Comment: Glucose reference range applies only to samples taken after fasting for at least 8 hours.   BUN 15 8 - 23 mg/dL   Creatinine 5.17 (H) 6.16 - 1.24 mg/dL   Calcium 9.4 8.9 - 07.3 mg/dL   Total Protein 7.5 6.5 - 8.1 g/dL   Albumin 4.3 3.5 - 5.0  g/dL   AST 22 15 - 41 U/L  ALT 17 0 - 44 U/L   Alkaline Phosphatase 55 38 - 126 U/L   Total Bilirubin 1.4 (H) <1.2 mg/dL   GFR, Estimated 56 (L) >60 mL/min    Comment: (NOTE) Calculated using the CKD-EPI Creatinine Equation (2021)    Anion gap 10 5 - 15    Comment: Performed at [facility], [address] [place], [place] [address]  CBC with Differential/Platelet     Status: Abnormal   Collection Time: 06/07/23 10:14 AM  Result Value Ref Range   WBC 3.1 (L) 4.0 - 10.5 K/uL   RBC 4.86 4.22 - 5.81 MIL/uL   Hemoglobin 15.6 13.0 - 17.0 g/dL   HCT 19.1 47.8 - 29.5 %   MCV 93.6 80.0 - 100.0 fL   MCH 32.1 26.0 - 34.0 pg   MCHC 34.3 30.0 - 36.0 g/dL   RDW 62.1 30.8 - 65.7 %   Platelets 342 150 - 400 K/uL   nRBC 0.0 0.0 - 0.2 %   Neutrophils Relative % 52 %   Neutro Abs 1.6 (L) 1.7 - 7.7 K/uL   Lymphocytes Relative 33 %   Lymphs Abs 1.0 0.7 - 4.0 K/uL   Monocytes Relative 12 %   Monocytes Absolute 0.4 0.1 - 1.0 K/uL   Eosinophils Relative 2 %   Eosinophils Absolute 0.1 0.0 - 0.5 K/uL   Basophils Relative 1 %   Basophils Absolute 0.0 0.0 - 0.1 K/uL   Immature Granulocytes 0 %   Abs Immature Granulocytes 0.01 0.00 - 0.07 K/uL    Comment: Performed at [facility], [address] [place], [place] [address]     PHQ2/9:    06/14/2023    8:07 AM 04/13/2023    8:12 AM 02/08/2023   11:08 AM 12/10/2022    8:56 AM 09/30/2022    9:29 AM  Depression screen PHQ 2/9  Decreased Interest 0 0 0 0 0  Down, Depressed, Hopeless 0 0 0 0 0  PHQ - 2 Score 0 0 0 0 0  Altered sleeping 0 0 0  0  Tired, decreased energy 0 0 0  0  Change in appetite 0 0 0  0  Feeling bad or failure about yourself  0 0 0  0  Trouble concentrating 0 0 0  0  Moving slowly or fidgety/restless 0 0 0  0  Suicidal thoughts 0 0 0  0  PHQ-9 Score 0 0 0  0  Difficult doing work/chores Not difficult at all Not difficult at all       phq 9 is negative   Fall Risk:    06/14/2023    8:05 AM 04/13/2023     8:12 AM 02/08/2023   11:07 AM 12/10/2022    8:54 AM 09/30/2022    9:28 AM  Fall Risk   Falls in the past year? 0 1 0 0 0  Number falls in past yr: 0 0 0 0 0  Injury with Fall? 0 0 0 0 0  Risk for fall due to :  Impaired balance/gait No Fall Risks No Fall Risks No Fall Risks  Follow up  Falls evaluation completed;Education provided;Falls prevention discussed Falls prevention discussed Education provided;Falls prevention discussed Falls prevention discussed    Functional Status Survey: Is the patient deaf or have difficulty hearing?: No Does the patient have difficulty seeing, even when wearing glasses/contacts?: No Does the patient have difficulty concentrating, remembering, or making decisions?: No Does the patient have difficulty walking or climbing  stairs?: No Does the patient have difficulty dressing or bathing?: No Does the patient have difficulty doing errands alone such as visiting a doctor's office or shopping?: No    Assessment & Plan  Assessment and Plan    Craniopharyngioma Post-surgical follow-up with neurosurgeon, Dr. Madaline Brilliant. Unclear if residual tumor or scar tissue present on imaging. No current symptoms reported. -Repeat imaging in 6 months as recommended by neurosurgeon. -Resume follow-up with endocrinologist, Dr. Gershon Crane, for monitoring of post-surgical complications including adrenal insufficiency and diabetes insipidus.  Chronic Kidney Disease (CKD) Improved from stage 4 to stage 3A. Secondary hyperparathyroidism improving. Lisinopril discontinued due to CKD. Blood pressure controlled on Amlodipine 5mg  daily. -Continue monitoring kidney function and secondary hyperparathyroidism.  Atherosclerosis of the Aorta On Rosuvastatin for cholesterol management. Last cholesterol level slightly elevated compared to previous. -Continue Rosuvastatin and monitor cholesterol levels.  Chronic Bronchitis No current symptoms. Discontinued Breo due to lack of perceived  benefit. -No changes to current management.  Prostate Cancer Post-prostatectomy. Follow-up with urologist, Dr. Naomie Dean. Reports occasional urinary incontinence. -Continue follow-up with urologist.  Alcohol Use Reports occasional alcohol consumption on weekends. -Continue monitoring alcohol use.  Vitamin B12 Deficiency Receiving B12 injections. -Continue B12 injections.  General Health Maintenance -Received flu and COVID vaccines. -Obtain records of COVID vaccine administration from [facility]. -Consider shingles vaccine.  Depression History of major depression, currently reports no depressive symptoms and is not on medication. -Continue monitoring mental health status.

## 2023-06-11 ENCOUNTER — Ambulatory Visit: Payer: Medicare HMO | Admitting: Oncology

## 2023-06-13 LAB — PROTEIN ELECTROPHORESIS, SERUM
A/G Ratio: 1.5 (ref 0.7–1.7)
Albumin ELP: 3.9 g/dL (ref 2.9–4.4)
Alpha-1-Globulin: 0.1 g/dL (ref 0.0–0.4)
Alpha-2-Globulin: 0.6 g/dL (ref 0.4–1.0)
Beta Globulin: 0.8 g/dL (ref 0.7–1.3)
Gamma Globulin: 1 g/dL (ref 0.4–1.8)
Globulin, Total: 2.6 g/dL (ref 2.2–3.9)
M-Spike, %: 0.4 g/dL — ABNORMAL HIGH
Total Protein ELP: 6.5 g/dL (ref 6.0–8.5)

## 2023-06-14 ENCOUNTER — Encounter: Payer: Self-pay | Admitting: Oncology

## 2023-06-14 ENCOUNTER — Encounter: Payer: Self-pay | Admitting: Family Medicine

## 2023-06-14 ENCOUNTER — Ambulatory Visit (INDEPENDENT_AMBULATORY_CARE_PROVIDER_SITE_OTHER): Payer: Medicare HMO | Admitting: Family Medicine

## 2023-06-14 ENCOUNTER — Inpatient Hospital Stay: Payer: Medicare HMO | Attending: Oncology | Admitting: Oncology

## 2023-06-14 VITALS — BP 112/68 | HR 97 | Temp 97.6°F | Resp 18 | Ht 69.0 in | Wt 193.9 lb

## 2023-06-14 VITALS — BP 126/85 | HR 100 | Temp 96.3°F | Wt 193.9 lb

## 2023-06-14 DIAGNOSIS — N1831 Chronic kidney disease, stage 3a: Secondary | ICD-10-CM | POA: Diagnosis not present

## 2023-06-14 DIAGNOSIS — C9000 Multiple myeloma not having achieved remission: Secondary | ICD-10-CM | POA: Diagnosis not present

## 2023-06-14 DIAGNOSIS — J410 Simple chronic bronchitis: Secondary | ICD-10-CM | POA: Diagnosis not present

## 2023-06-14 DIAGNOSIS — D444 Neoplasm of uncertain behavior of craniopharyngeal duct: Secondary | ICD-10-CM | POA: Diagnosis not present

## 2023-06-14 DIAGNOSIS — Z87891 Personal history of nicotine dependence: Secondary | ICD-10-CM | POA: Insufficient documentation

## 2023-06-14 DIAGNOSIS — D72819 Decreased white blood cell count, unspecified: Secondary | ICD-10-CM | POA: Diagnosis not present

## 2023-06-14 DIAGNOSIS — N189 Chronic kidney disease, unspecified: Secondary | ICD-10-CM | POA: Insufficient documentation

## 2023-06-14 DIAGNOSIS — I700 Atherosclerosis of aorta: Secondary | ICD-10-CM

## 2023-06-14 DIAGNOSIS — Z79899 Other long term (current) drug therapy: Secondary | ICD-10-CM | POA: Diagnosis not present

## 2023-06-14 DIAGNOSIS — F1020 Alcohol dependence, uncomplicated: Secondary | ICD-10-CM

## 2023-06-14 DIAGNOSIS — F325 Major depressive disorder, single episode, in full remission: Secondary | ICD-10-CM

## 2023-06-14 DIAGNOSIS — D472 Monoclonal gammopathy: Secondary | ICD-10-CM | POA: Diagnosis not present

## 2023-06-14 DIAGNOSIS — I10 Essential (primary) hypertension: Secondary | ICD-10-CM

## 2023-06-14 DIAGNOSIS — C61 Malignant neoplasm of prostate: Secondary | ICD-10-CM | POA: Insufficient documentation

## 2023-06-14 DIAGNOSIS — E538 Deficiency of other specified B group vitamins: Secondary | ICD-10-CM | POA: Diagnosis not present

## 2023-06-14 DIAGNOSIS — N2581 Secondary hyperparathyroidism of renal origin: Secondary | ICD-10-CM | POA: Diagnosis not present

## 2023-06-14 MED ORDER — CYANOCOBALAMIN 1000 MCG/ML IJ SOLN
1000.0000 ug | Freq: Once | INTRAMUSCULAR | Status: AC
Start: 2023-06-14 — End: 2023-06-14
  Administered 2023-06-14: 1000 ug via INTRAMUSCULAR

## 2023-06-14 NOTE — Progress Notes (Signed)
[facility]  Telephone:([phone]) [phone] Fax:(336) [phone]  ID: [name] OB: 1950/11/26  MR#: [id number]  AOZ#:[id number]  Patient Care Team: Alba Cory, MD as PCP - General (Family Medicine) Riki Altes, MD as Consulting Physician (Urology) Sondra Come, MD as Consulting Physician (Urology) Carmina Miller, MD as Referring Physician (Radiation Oncology) Alwyn Pea, MD as Consulting Physician (Cardiology) Rodney Langton, RN as [facility] Care Management  CHIEF COMPLAINT: Smoldering lambda chain myeloma with q13-.  INTERVAL HISTORY: Patient returns to clinic today for repeat laboratory work and routine 92-month evaluation.  He continues to feel well and remains asymptomatic.  He has no neurologic complaints. He denies any recent fevers or illnesses.  He has a good appetite and denies weight loss.  He has no chest pain, shortness of breath, cough, or hemoptysis.  He denies any nausea, vomiting, constipation, or diarrhea.  He has no urinary complaints.  Patient offers no specific complaints today.  REVIEW OF SYSTEMS:   Review of Systems  Constitutional: Negative.  Negative for fever, malaise/fatigue and weight loss.  Eyes:  Negative for blurred vision and double vision.  Respiratory: Negative.  Negative for cough, hemoptysis and shortness of breath.   Cardiovascular: Negative.  Negative for chest pain and leg swelling.  Gastrointestinal: Negative.  Negative for abdominal pain.  Genitourinary: Negative.  Negative for dysuria.  Musculoskeletal: Negative.  Negative for back pain.  Skin: Negative.  Negative for rash.  Neurological: Negative.  Negative for dizziness, focal weakness, weakness and headaches.  Psychiatric/Behavioral: Negative.  The patient is not nervous/anxious.     As per HPI. Otherwise, a complete review of systems is negative.  PAST MEDICAL HISTORY: Past Medical History:  Diagnosis Date   Anxiety    Panic  attacks in the past   Arthritis    Benign prostatic hypertrophy without lower urinary tract symptoms    Cancer (HCC)    Prostate. monitoring now since surgery   Colon polyp    GERD (gastroesophageal reflux disease)    Glaucoma (increased eye pressure)    Left eye only   Hyperlipidemia    Hypertension     PAST SURGICAL HISTORY: Past Surgical History:  Procedure Laterality Date   COLONOSCOPY  07/13/2008   Dr Carollee Massed   COLONOSCOPY WITH PROPOFOL N/A 01/05/2020   Procedure: COLONOSCOPY WITH PROPOFOL;  Surgeon: Wyline Mood, MD;  Location: [facility] ENDOSCOPY;  Service: Gastroenterology;  Laterality: N/A;   excision skin cyst  06/19/2014   Dr. Cathren Harsh CYST   INGUINAL HERNIA REPAIR Right 09/06/2020   Procedure: HERNIA REPAIR RIGH  INGUINAL WITH MESH ADULT, open with RNFA to assist;  Surgeon: Duanne Guess, MD;  Location: [facility] ORS;  Service: General;  Laterality: Right;  Provider requesting 2 hours/120 minutes for procedure.   Neuroendoscopy  05/28/2022   With excision pituitary tumor transnasal or transphenoidal approach. Resection of craniopharyngioma.   PELVIC LYMPH NODE DISSECTION N/A 07/15/2016   Procedure: PELVIC LYMPH NODE DISSECTION;  Surgeon: Hildred Laser, MD;  Location: [facility] ORS;  Service: Urology;  Laterality: N/A;   ROBOT ASSISTED LAPAROSCOPIC RADICAL PROSTATECTOMY N/A 07/15/2016   Procedure: ROBOTIC ASSISTED LAPAROSCOPIC RADICAL PROSTATECTOMY;  Surgeon: Hildred Laser, MD;  Location: [facility] ORS;  Service: Urology;  Laterality: N/A;    FAMILY HISTORY: Family History  Problem Relation Age of Onset   Heart disease Mother    Heart attack Mother    Diabetes Father    Heart attack Father    Dementia Father  Cancer Sister        Breast   Cancer Brother        Prostate   Healthy Sister    Stroke Brother     ADVANCED DIRECTIVES (Y/N):  N  HEALTH MAINTENANCE: Social History   Tobacco Use   Smoking status: Former    Current packs/day: 0.00    Average  packs/day: 0.5 packs/day for 40.0 years (20.0 ttl pk-yrs)    Types: Cigarettes    Start date: 03/23/1976    Quit date: 03/23/2016    Years since quitting: 7.2   Smokeless tobacco: Never  Vaping Use   Vaping status: Never Used  Substance Use Topics   Alcohol use: Yes    Alcohol/week: 2.0 - 4.0 standard drinks of alcohol    Types: 2 - 4 Standard drinks or equivalent per week    Comment: 1-2 times per week   Drug use: No     Colonoscopy:  PAP:  Bone density:  Lipid panel:  No Known Allergies  Current Outpatient Medications  Medication Sig Dispense Refill   amLODipine (NORVASC) 5 MG tablet Take 1 tablet (5 mg total) by mouth daily. 90 tablet 1   aspirin 81 MG tablet Take 81 mg by mouth daily.     brimonidine-timolol (COMBIGAN) 0.2-0.5 % ophthalmic solution Place 1 drop into both eyes every 12 (twelve) hours.     Cholecalciferol (VITAMIN D) 2000 units CAPS Take 1 capsule (2,000 Units total) by mouth daily. 30 capsule 0   fluticasone (FLONASE) 50 MCG/ACT nasal spray Place 2 sprays into both nostrils daily. 16 g 6   latanoprost (XALATAN) 0.005 % ophthalmic solution Place 1 drop into both eyes at bedtime.      rosuvastatin (CRESTOR) 40 MG tablet TAKE 1 TABLET BY MOUTH ONCE DAILY IN  PLACE  OF  ATORVASTATIN 90 tablet 1   sildenafil (VIAGRA) 100 MG tablet Take 1 tablet (100 mg total) by mouth daily as needed for erectile dysfunction. 30 tablet 6   tiZANidine (ZANAFLEX) 2 MG tablet Take 1 tablet (2 mg total) by mouth every 8 (eight) hours as needed for muscle spasms. 12 tablet 0   triamcinolone cream (KENALOG) 0.1 % APPLY TOPICALLY AS DIRECTED TWICE DAILY 45 g 0   No current facility-administered medications for this visit.    OBJECTIVE: Vitals:   06/14/23 1016  BP: 126/85  Pulse: 100  Temp: (!) 96.3 F (35.7 C)  SpO2: 100%     Body mass index is 28.63 kg/m.    ECOG FS:0 - Asymptomatic  General: Well-developed, well-nourished, no acute distress. Eyes: Pink conjunctiva, anicteric  sclera. HEENT: Normocephalic, moist mucous membranes. Lungs: No audible wheezing or coughing. Heart: Regular rate and rhythm. Abdomen: Soft, nontender, no obvious distention. Musculoskeletal: No edema, cyanosis, or clubbing. Neuro: Alert, answering all questions appropriately. Cranial nerves grossly intact. Skin: No rashes or petechiae noted. Psych: Normal affect.  LAB RESULTS:  Lab Results  Component Value Date   NA 137 06/07/2023   K 4.3 06/07/2023   CL 102 06/07/2023   CO2 25 06/07/2023   GLUCOSE 95 06/07/2023   BUN 15 06/07/2023   CREATININE 1.34 (H) 06/07/2023   CALCIUM 9.4 06/07/2023   PROT 7.5 06/07/2023   ALBUMIN 4.3 06/07/2023   AST 22 06/07/2023   ALT 17 06/07/2023   ALKPHOS 55 06/07/2023   BILITOT 1.4 (H) 06/07/2023   GFRNONAA 56 (L) 06/07/2023   GFRAA 74 05/02/2020    Lab Results  Component Value Date  WBC 3.1 (L) 06/07/2023   NEUTROABS 1.6 (L) 06/07/2023   HGB 15.6 06/07/2023   HCT 45.5 06/07/2023   MCV 93.6 06/07/2023   PLT 342 06/07/2023     STUDIES: No results found.  ASSESSMENT: Smoldering lambda chain myeloma with q13-, MYD88 mutation.  PLAN:    Smoldering lambda chain myeloma: Bone marrow biopsy completed on August 19, 2022 revealed approximately 25% plasma cells.  Cytogenetics reviewed q13 - mutation. The MYD 21 mutation is associated with IgM myeloma as well as Waldenstrm's and indicates an increased risk to progression to overt myeloma.  Patient's M spike has ranged from 0.3-0.4 since August 10, 2022.  His IgM component has ranged from 285-305.  His most recent result is 305.  His lambda free light chains have ranged from 166.4-227.6 over the same timeframe.  His most recent result is 152.  He has mild renal insufficiency which is unchanged.  He also has a chronic leukopenia.  He has no other evidence of endorgan disease.  Previously, case discussed with nephrology.  No treatment is needed at this time.  Patient expressed understanding that  he may require treatment in the future.  Return to clinic in 6 months for laboratory work and routine evaluation. Chronic renal insufficiency: Patient's most recent creatinine is 1.34.  Continue follow-up with nephrology as indicated.  Bone marrow biopsy as above.   Leukopenia: Chronic and unchanged.  Bone marrow biopsy results as above. Pituitary tumor: Patient underwent surgical resection at [facility] in November 2023.  Continue follow-up and imaging with MRI with [facility] neurosurgery. Prostate cancer: Patient underwent prostatectomy followed by XRT for local control.  PSA is monitored by primary care.    Patient expressed understanding and was in agreement with this plan. He also understands that He can call clinic at any time with any questions, concerns, or complaints.    Cancer Staging  No matching staging information was found for the patient.   Jeralyn Ruths, MD   06/14/2023 10:46 AM

## 2023-06-21 DIAGNOSIS — H2513 Age-related nuclear cataract, bilateral: Secondary | ICD-10-CM | POA: Diagnosis not present

## 2023-07-09 ENCOUNTER — Other Ambulatory Visit: Payer: Self-pay | Admitting: Emergency Medicine

## 2023-07-09 DIAGNOSIS — Z122 Encounter for screening for malignant neoplasm of respiratory organs: Secondary | ICD-10-CM

## 2023-07-09 DIAGNOSIS — Z87891 Personal history of nicotine dependence: Secondary | ICD-10-CM

## 2023-07-20 ENCOUNTER — Ambulatory Visit (INDEPENDENT_AMBULATORY_CARE_PROVIDER_SITE_OTHER): Payer: Medicare HMO | Admitting: Family Medicine

## 2023-07-20 ENCOUNTER — Encounter: Payer: Self-pay | Admitting: Family Medicine

## 2023-07-20 VITALS — BP 130/82 | HR 98 | Resp 16 | Ht 69.0 in | Wt 196.1 lb

## 2023-07-20 DIAGNOSIS — E538 Deficiency of other specified B group vitamins: Secondary | ICD-10-CM

## 2023-07-20 DIAGNOSIS — H1131 Conjunctival hemorrhage, right eye: Secondary | ICD-10-CM

## 2023-07-20 MED ORDER — CYANOCOBALAMIN 1000 MCG/ML IJ SOLN
1000.0000 ug | Freq: Once | INTRAMUSCULAR | Status: DC
Start: 2023-07-20 — End: 2023-07-20

## 2023-07-20 NOTE — Progress Notes (Signed)
 Name: Ernest Sweeney   MRN: [id number]    DOB: [birth year]   Date:07/20/2023       Progress Note  Subjective  Chief Complaint  Chief Complaint  Patient presents with   Eye Problem    Red eyes bilateral but r eye worst, sometimes itches, watery since Friday    HPI  Discussed the use of AI scribe software for clinical note transcription with the patient, who gave verbal consent to proceed.  History of Present Illness   The patient presented with a chief complaint of redness in both eyes, predominantly in the right eye. The issue was first noticed the previous Friday. The patient denied any associated pain, but had a white spot on the right side of his face upon waking in the morning yesterday. Denies any additional vision problems beyond pre-existing conditions.  The patient has a history of glaucoma and had undergone eye surgery in the previous year. He has been using Combigan drops twice a day and Xalatan  at bedtime for his eye condition. The patient had seen his eye specialist in December 2024 and had been informed of some redness due to a broken blood vein.  The patient also reported a history of B12 deficiency, for which he receives regular B12 injections. The patient's last injection was on the second of the month.          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) 09/30/2022   Other neutropenia (HCC) 12/30/2021   Intermittent low back pain 12/30/2021   Essential hypertension 12/30/2021   Left inguinal hernia 12/30/2021   History of right inguinal hernia repair    Alcoholism (HCC) 06/28/2020   Major depression in remission (HCC) 06/28/2020   Lung nodules 09/04/2018   Chronic bronchitis (HCC) 04/23/2017   Atherosclerosis of aorta (HCC) 04/23/2017   Coronary artery disease due to calcified coronary lesion 04/23/2017   Glaucoma, left eye 08/04/2016   History of  prostate cancer 07/15/2016   History of prostatectomy 07/15/2016   Vitamin D  deficiency 05/31/2015   Dyslipidemia 05/31/2015   Seasonal allergic rhinitis 05/31/2015   Chronic pain of both shoulders 05/31/2015   Family history of malignant neoplasm of prostate 07/26/2012   Hematuria, microscopic 07/26/2012    Social History   Tobacco Use   Smoking status: Former    Current packs/day: 0.00    Average packs/day: 0.5 packs/day for 40.0 years (20.0 ttl pk-yrs)    Types: Cigarettes    Start date: 03/23/1976    Quit date: 03/23/2016    Years since quitting: 7.3   Smokeless tobacco: Never  Substance Use Topics   Alcohol use: Yes    Alcohol/week: 2.0 - 4.0 standard drinks of alcohol    Types: 2 - 4 Standard drinks or equivalent per week    Comment: 1-2 times per week     Current Outpatient Medications:    amLODipine  (NORVASC ) 5 MG tablet, Take 1 tablet (5 mg total) by mouth daily., Disp: 90 tablet, Rfl: 1   aspirin 81 MG tablet, Take 81 mg by mouth daily., Disp: , Rfl:    brimonidine-timolol (COMBIGAN) 0.2-0.5 % ophthalmic solution, Place 1 drop into both eyes every 12 (twelve) hours., Disp: , Rfl:    Cholecalciferol (VITAMIN D ) 2000 units CAPS, Take 1 capsule (2,000 Units total) by mouth daily., Disp: 30 capsule, Rfl: 0   fluticasone  (FLONASE ) 50 MCG/ACT nasal spray,  Place 2 sprays into both nostrils daily., Disp: 16 g, Rfl: 6   latanoprost  (XALATAN ) 0.005 % ophthalmic solution, Place 1 drop into both eyes at bedtime. , Disp: , Rfl:    rosuvastatin  (CRESTOR ) 40 MG tablet, TAKE 1 TABLET BY MOUTH ONCE DAILY IN  PLACE  OF  ATORVASTATIN , Disp: 90 tablet, Rfl: 1   sildenafil  (VIAGRA ) 100 MG tablet, Take 1 tablet (100 mg total) by mouth daily as needed for erectile dysfunction., Disp: 30 tablet, Rfl: 6   tiZANidine  (ZANAFLEX ) 2 MG tablet, Take 1 tablet (2 mg total) by mouth every 8 (eight) hours as needed for muscle spasms., Disp: 12 tablet, Rfl: 0   triamcinolone  cream (KENALOG ) 0.1 %, APPLY  TOPICALLY AS DIRECTED TWICE DAILY, Disp: 45 g, Rfl: 0  No Known Allergies  ROS  Ten systems reviewed and is negative except as mentioned in HPI    Objective  Vitals:   07/20/23 1129  BP: 130/82  Pulse: 98  Resp: 16  SpO2: 97%  Weight: 196 lb 1.6 oz (89 kg)  Height: 5' 9 (1.753 m)    Body mass index is 28.96 kg/m.    Physical Exam  Constitutional: Patient appears well-developed and well-nourished.  No distress.  HEENT: head atraumatic, normocephalic, pupils equal and reactive to light, injected conjunctiva - well demarcated border above right cornea,neck supple, - see attached pictures Cardiovascular: Normal rate, regular rhythm and normal heart sounds.  No murmur heard. No BLE edema. Pulmonary/Chest: Effort normal and breath sounds normal. No respiratory distress. Abdominal: Soft.  There is no tenderness. Psychiatric: Patient has a normal mood and affect. behavior is normal. Judgment and thought content normal.   Recent Results (from the past 2160 hours)  Protein electrophoresis, serum     Status: Abnormal   Collection Time: 06/07/23 10:14 AM  Result Value Ref Range   Total Protein ELP 6.5 6.0 - 8.5 g/dL   Albumin ELP 3.9 2.9 - 4.4 g/dL   Joeyj-8-Honalopw 0.1 0.0 - 0.4 g/dL   Joeyj-7-Honalopw 0.6 0.4 - 1.0 g/dL   Beta Globulin 0.8 0.7 - 1.3 g/dL   Gamma Globulin 1.0 0.4 - 1.8 g/dL   M-Spike, % 0.4 (H) Not Observed g/dL    Comment: An additional m spike was observed at a concentration of 0.2 g/dl    SPE Interp. Comment     Comment: (NOTE) The SPE pattern demonstrates two peaks in the beta-gamma region which may represent monoclonal protein. A biclonal gammopathy may be confirmed by immunofixation, as well as evaluation of the urine for the presence of Bence-Jones protein. Performed At: [facility] [address] [place], [place] [id number] Jennette Shorter MD Ey:[id number]    Comment Comment     Comment: (NOTE) Protein electrophoresis scan will  follow via computer, mail, or courier delivery.    Globulin, Total 2.6 2.2 - 3.9 g/dL   A/G Ratio 1.5 0.7 - 1.7  Kappa/lambda light chains     Status: Abnormal   Collection Time: 06/07/23 10:14 AM  Result Value Ref Range   Kappa free light chain 12.5 3.3 - 19.4 mg/L   Lambda free light chains 151.2 (H) 5.7 - 26.3 mg/L   Kappa, lambda light chain ratio 0.08 (L) 0.26 - 1.65    Comment: (NOTE) Performed At: [facility] [address] [place], [place] [id number] Jennette Shorter MD Ey:[id number]   IgG, IgA, IgM     Status: Abnormal   Collection Time: 06/07/23 10:14 AM  Result Value Ref Range  IgG (Immunoglobin G), Serum 965 603 - 1,613 mg/dL   IgA 45 (L) 61 - 562 mg/dL    Comment: Result confirmed on concentration.   IgM (Immunoglobulin M), Srm 305 (H) 15 - 143 mg/dL    Comment: (NOTE) Performed At: [facility] [address] [place], [place] [id number] Jennette Shorter MD Ey:[id number]   CMP (Cancer Center only)     Status: Abnormal   Collection Time: 06/07/23 10:14 AM  Result Value Ref Range   Sodium 137 135 - 145 mmol/L   Potassium 4.3 3.5 - 5.1 mmol/L   Chloride 102 98 - 111 mmol/L   CO2 25 22 - 32 mmol/L   Glucose, Bld 95 70 - 99 mg/dL    Comment: Glucose reference range applies only to samples taken after fasting for at least 8 hours.   BUN 15 8 - 23 mg/dL   Creatinine 8.65 (H) 9.38 - 1.24 mg/dL   Calcium  9.4 8.9 - 10.3 mg/dL   Total Protein 7.5 6.5 - 8.1 g/dL   Albumin 4.3 3.5 - 5.0 g/dL   AST 22 15 - 41 U/L   ALT 17 0 - 44 U/L   Alkaline Phosphatase 55 38 - 126 U/L   Total Bilirubin 1.4 (H) <1.2 mg/dL   GFR, Estimated 56 (L) >60 mL/min    Comment: (NOTE) Calculated using the CKD-EPI Creatinine Equation (2021)    Anion gap 10 5 - 15    Comment: Performed at [facility] [place], [address] [place], [place] [address]  CBC with Differential/Platelet     Status: Abnormal   Collection Time: 06/07/23 10:14 AM  Result Value Ref Range   WBC 3.1  (L) 4.0 - 10.5 K/uL   RBC 4.86 4.22 - 5.81 MIL/uL   Hemoglobin 15.6 13.0 - 17.0 g/dL   HCT 54.4 60.9 - 47.9 %   MCV 93.6 80.0 - 100.0 fL   MCH 32.1 26.0 - 34.0 pg   MCHC 34.3 30.0 - 36.0 g/dL   RDW 87.4 88.4 - 84.4 %   Platelets 342 150 - 400 K/uL   nRBC 0.0 0.0 - 0.2 %   Neutrophils Relative % 52 %   Neutro Abs 1.6 (L) 1.7 - 7.7 K/uL   Lymphocytes Relative 33 %   Lymphs Abs 1.0 0.7 - 4.0 K/uL   Monocytes Relative 12 %   Monocytes Absolute 0.4 0.1 - 1.0 K/uL   Eosinophils Relative 2 %   Eosinophils Absolute 0.1 0.0 - 0.5 K/uL   Basophils Relative 1 %   Basophils Absolute 0.0 0.0 - 0.1 K/uL   Immature Granulocytes 0 %   Abs Immature Granulocytes 0.01 0.00 - 0.07 K/uL    Comment: Performed at [facility], [address] [place], [place] [address]     Assessment & Plan  Assessment and Plan    Subconjunctival Hemorrhage Redness in right eye since last Friday, likely due to a burst blood vessel. No pain, vision changes, or significant discharge. Likely due to a forceful sneeze which patients states happened a couple of times last week . - No specific treatment needed. - Advise patient to return if symptoms worsen or if there are changes in vision, pain, or significant discharge.  Glaucoma Patient on Combigan twice daily and Xalatan  at bedtime. Last seen by ophthalmologist in December. - Continue current eye drops as prescribed. - Advise patient to contact ophthalmologist if there are changes in vision, pain, or significant discharge.  Vitamin B12 Deficiency Patient due for  B12 injection. - Administer B12 injection today.  Follow-up Patient to keep next scheduled appointment.

## 2023-07-22 ENCOUNTER — Ambulatory Visit (INDEPENDENT_AMBULATORY_CARE_PROVIDER_SITE_OTHER): Payer: Medicare HMO

## 2023-07-22 DIAGNOSIS — E538 Deficiency of other specified B group vitamins: Secondary | ICD-10-CM

## 2023-07-22 MED ORDER — CYANOCOBALAMIN 1000 MCG/ML IJ SOLN
1000.0000 ug | Freq: Once | INTRAMUSCULAR | Status: AC
Start: 2023-07-22 — End: 2023-07-22
  Administered 2023-07-22: 1000 ug via INTRAMUSCULAR

## 2023-07-22 NOTE — Progress Notes (Signed)
 Patient is in office today for a nurse visit for B12 Injection. Patient Injection was given in the  Left deltoid. Patient tolerated injection well.

## 2023-08-16 DIAGNOSIS — D444 Neoplasm of uncertain behavior of craniopharyngeal duct: Secondary | ICD-10-CM | POA: Diagnosis not present

## 2023-08-17 ENCOUNTER — Ambulatory Visit
Admission: RE | Admit: 2023-08-17 | Discharge: 2023-08-17 | Disposition: A | Discharge status: Home / Self Care | Payer: Medicare HMO | Source: Ambulatory Visit | Attending: Acute Care | Admitting: Acute Care

## 2023-08-17 DIAGNOSIS — Z122 Encounter for screening for malignant neoplasm of respiratory organs: Secondary | ICD-10-CM | POA: Diagnosis not present

## 2023-08-17 DIAGNOSIS — F17210 Nicotine dependence, cigarettes, uncomplicated: Secondary | ICD-10-CM | POA: Diagnosis not present

## 2023-08-17 DIAGNOSIS — Z87891 Personal history of nicotine dependence: Secondary | ICD-10-CM | POA: Insufficient documentation

## 2023-08-18 DIAGNOSIS — R9089 Other abnormal findings on diagnostic imaging of central nervous system: Secondary | ICD-10-CM | POA: Diagnosis not present

## 2023-08-18 DIAGNOSIS — H539 Unspecified visual disturbance: Secondary | ICD-10-CM | POA: Diagnosis not present

## 2023-08-18 DIAGNOSIS — H538 Other visual disturbances: Secondary | ICD-10-CM | POA: Diagnosis not present

## 2023-08-20 ENCOUNTER — Telehealth: Payer: Self-pay | Admitting: *Deleted

## 2023-08-20 NOTE — Patient Outreach (Signed)
  Care Coordination   Follow Up Visit Note   08/23/2023 Name: Ernest Sweeney MRN: [id number] DOB: 1950-12-26  ABBIE Sweeney is a 73 y.o. year old male who sees Sowles, Krichna, MD for primary care. I spoke with  Ernest Sweeney by phone today.  What matters to the patients health and wellness today?  Patient report chronic conditions being stable at this time, but has been working with specialist on suprasellar mass that has caused blurred vision.  Using eye drops to decrease eye pressure.  Denies any urgent concerns, encouraged to contact this care manager with questions.     Goals Addressed             This Visit's Progress    Management of chronic medical conditions by eating right and exercising   On track    Care Coordination Interventions: Evaluation of current treatment plan related to hypertension self management and patient's adherence to plan as established by provider Reviewed medications with patient and discussed importance of compliance Provided assistance with obtaining home blood pressure monitor via THN; Reviewed scheduled/upcoming provider appointments including:  Discussed complications of poorly controlled blood pressure such as heart disease, stroke, circulatory complications, vision complications, kidney impairment, sexual dysfunction Screening for signs and symptoms of depression related to chronic disease state  Assessed social determinant of health barriers         SDOH assessments and interventions completed:  No     Care Coordination Interventions:  Yes, provided   Interventions Today    Flowsheet Row Most Recent Value  Chronic Disease   Chronic disease during today's visit Other, Hypertension (HTN)  [suprasellar mass causing visual changes]  General Interventions   General Interventions Discussed/Reviewed General Interventions Reviewed, Doctor Visits  Doctor Visits Discussed/Reviewed Doctor Visits Reviewed, Specialist  [reviewed upcoming:  nephrology 2/20]  PCP/Specialist Visits Compliance with follow-up visit  Education Interventions   Education Provided Provided Education  Provided Verbal Education On Eye Care, Medication, When to see the doctor  [meds reviewed, report now on Rocklatan but price was over $100, unable to pay. Assisted with signing up for discount card, emailed to sister]        Follow up plan: Follow up call scheduled for 2/21    Encounter Outcome:  Patient Visit Completed   Ernest Ku, RN, MSN, CCM [place]  [facility], [facility] RN Care Coordinator Direct Dial: [phone] / Main [phone] Fax [phone] Email: [email or web address]

## 2023-08-23 NOTE — Patient Instructions (Signed)
 Visit Information  Thank you for taking time to visit with me today. Please don't hesitate to contact me if I can be of assistance to you before our next scheduled telephone appointment.  Following are the goals we discussed today:  Check email for discount card for Rocklatan  Our next appointment is by telephone on 2/21  Please call the care guide team at [phone] if you need to cancel or reschedule your appointment.   Please call the Suicide and Crisis Lifeline: 988 call the [place]  National Suicide Prevention Lifeline: (848)108-1869 or TTY: 520-772-5511 TTY (214)840-5390) to talk to a trained counselor call 1-800-273-TALK (toll free, 24 hour hotline) call 911 if you are experiencing a Mental Health or Behavioral Health Crisis or need someone to talk to.  Patient verbalizes understanding of instructions and care plan provided today and agrees to view in MyChart. Active MyChart status and patient understanding of how to access instructions and care plan via MyChart confirmed with patient.     The patient has been provided with contact information for the care management team and has been advised to call with any health related questions or concerns.   Holland Lundborg, RN, MSN, CCM [facility], [facility] RN Care Coordinator Direct Dial: [phone] / Main [phone] Fax [phone] Email: [email or web address]: [email or web address]

## 2023-08-26 ENCOUNTER — Ambulatory Visit: Payer: Medicare HMO

## 2023-08-26 DIAGNOSIS — E538 Deficiency of other specified B group vitamins: Secondary | ICD-10-CM

## 2023-08-26 MED ORDER — CYANOCOBALAMIN 1000 MCG/ML IJ SOLN
1000.0000 ug | Freq: Once | INTRAMUSCULAR | Status: AC
Start: 2023-08-26 — End: 2023-08-26
  Administered 2023-08-26: 1000 ug via INTRAMUSCULAR

## 2023-08-26 NOTE — Progress Notes (Signed)
Patient is in office today for a nurse visit for B12 Injection. Patient Injection was given in the  Left deltoid. Patient tolerated injection well.

## 2023-08-27 ENCOUNTER — Other Ambulatory Visit: Payer: Self-pay | Admitting: Acute Care

## 2023-08-27 DIAGNOSIS — Z122 Encounter for screening for malignant neoplasm of respiratory organs: Secondary | ICD-10-CM

## 2023-08-27 DIAGNOSIS — Z87891 Personal history of nicotine dependence: Secondary | ICD-10-CM

## 2023-08-28 ENCOUNTER — Other Ambulatory Visit: Payer: Self-pay | Admitting: Family Medicine

## 2023-08-28 DIAGNOSIS — I10 Essential (primary) hypertension: Secondary | ICD-10-CM

## 2023-09-03 ENCOUNTER — Ambulatory Visit: Payer: Self-pay | Admitting: *Deleted

## 2023-09-03 NOTE — Patient Outreach (Signed)
Care Coordination   Follow Up Visit Note   09/03/2023 Name: Ernest Sweeney MRN: [id number] DOB: 1950-11-06  Ernest Sweeney is a 73 y.o. year old male who sees Alba Cory, MD for primary care. I spoke with  Ernest Sweeney by phone today.  What matters to the patients health and wellness today?  Patient report doing well, will follow up with sister about Rocklatan eye drops.  Denies any urgent concerns, encouraged to contact this care manager with questions.      Goals Addressed             This Visit's Progress    COMPLETED: Management of chronic medical conditions by eating right and exercising   On track    Care Coordination Interventions: Evaluation of current treatment plan related to hypertension self management and patient's adherence to plan as established by provider Reviewed medications with patient and discussed importance of compliance Provided assistance with obtaining home blood pressure monitor via THN; Reviewed scheduled/upcoming provider appointments including:  Discussed complications of poorly controlled blood pressure such as heart disease, stroke, circulatory complications, vision complications, kidney impairment, sexual dysfunction Screening for signs and symptoms of depression related to chronic disease state  Assessed social determinant of health barriers         SDOH assessments and interventions completed:  No     Care Coordination Interventions:  Yes, provided   Interventions Today    Flowsheet Row Most Recent Value  Chronic Disease   Chronic disease during today's visit Other  [Vision changes]  General Interventions   General Interventions Discussed/Reviewed General Interventions Reviewed, Doctor Visits  Doctor Visits Discussed/Reviewed Doctor Visits Reviewed, PCP, Specialist  [facility] nephrology 2/27, urology 3/20, PCP 4/14, oncology 5/21]  PCP/Specialist Visits Compliance with follow-up visit  Education Interventions   Education  Provided Provided Education  Provided Verbal Education On Medication, When to see the doctor, Other  [Medications reviewed, advised to have sister check her email again for Rocklatan discount card and/or go to website directly and register for coupon card.]       Follow up plan: No further intervention required.   Encounter Outcome:  Patient Visit Completed   Rodney Langton, RN, MSN, CCM [place]  [facility], [facility] RN Care Coordinator Direct Dial: [phone] / Main [phone] Fax [phone] Email: [email or web address]

## 2023-09-09 DIAGNOSIS — R809 Proteinuria, unspecified: Secondary | ICD-10-CM | POA: Diagnosis not present

## 2023-09-09 DIAGNOSIS — N2581 Secondary hyperparathyroidism of renal origin: Secondary | ICD-10-CM | POA: Diagnosis not present

## 2023-09-09 DIAGNOSIS — D808 Other immunodeficiencies with predominantly antibody defects: Secondary | ICD-10-CM | POA: Diagnosis not present

## 2023-09-09 DIAGNOSIS — N1831 Chronic kidney disease, stage 3a: Secondary | ICD-10-CM | POA: Diagnosis not present

## 2023-09-09 DIAGNOSIS — N29 Other disorders of kidney and ureter in diseases classified elsewhere: Secondary | ICD-10-CM | POA: Diagnosis not present

## 2023-09-09 DIAGNOSIS — I10 Essential (primary) hypertension: Secondary | ICD-10-CM | POA: Diagnosis not present

## 2023-09-17 DIAGNOSIS — H401132 Primary open-angle glaucoma, bilateral, moderate stage: Secondary | ICD-10-CM | POA: Diagnosis not present

## 2023-09-20 ENCOUNTER — Other Ambulatory Visit: Payer: Self-pay | Admitting: Family Medicine

## 2023-09-20 DIAGNOSIS — I700 Atherosclerosis of aorta: Secondary | ICD-10-CM

## 2023-09-22 ENCOUNTER — Other Ambulatory Visit: Payer: Self-pay

## 2023-09-22 DIAGNOSIS — C61 Malignant neoplasm of prostate: Secondary | ICD-10-CM

## 2023-09-22 DIAGNOSIS — Z9079 Acquired absence of other genital organ(s): Secondary | ICD-10-CM

## 2023-09-23 ENCOUNTER — Other Ambulatory Visit: Payer: Self-pay

## 2023-09-23 DIAGNOSIS — H401132 Primary open-angle glaucoma, bilateral, moderate stage: Secondary | ICD-10-CM | POA: Diagnosis not present

## 2023-09-23 DIAGNOSIS — H5347 Heteronymous bilateral field defects: Secondary | ICD-10-CM | POA: Diagnosis not present

## 2023-09-23 DIAGNOSIS — Z9079 Acquired absence of other genital organ(s): Secondary | ICD-10-CM | POA: Diagnosis not present

## 2023-09-23 DIAGNOSIS — H2513 Age-related nuclear cataract, bilateral: Secondary | ICD-10-CM | POA: Diagnosis not present

## 2023-09-23 DIAGNOSIS — C61 Malignant neoplasm of prostate: Secondary | ICD-10-CM

## 2023-09-24 LAB — PSA: Prostate Specific Ag, Serum: <0.1 ng/mL (ref 0.0–4.0)

## 2023-09-30 ENCOUNTER — Ambulatory Visit (INDEPENDENT_AMBULATORY_CARE_PROVIDER_SITE_OTHER): Payer: Medicare HMO | Admitting: Urology

## 2023-09-30 VITALS — BP 137/95 | HR 96 | Ht 69.0 in | Wt 190.5 lb

## 2023-09-30 DIAGNOSIS — Z9079 Acquired absence of other genital organ(s): Secondary | ICD-10-CM | POA: Diagnosis not present

## 2023-09-30 DIAGNOSIS — N529 Male erectile dysfunction, unspecified: Secondary | ICD-10-CM | POA: Diagnosis not present

## 2023-09-30 DIAGNOSIS — C61 Malignant neoplasm of prostate: Secondary | ICD-10-CM

## 2023-09-30 MED ORDER — SILDENAFIL CITRATE 100 MG PO TABS
100.0000 mg | ORAL_TABLET | Freq: Every day | ORAL | 6 refills | Status: AC | PRN
Start: 2023-09-30 — End: ?

## 2023-09-30 NOTE — Progress Notes (Signed)
   09/30/2023 1:30 PM   Ernest Sweeney Apr 12, 1951 [id number]  Reason for visit: Follow up prostate cancer, ED, stress incontinence   HPI: I saw [name] in urology clinic today for prostate cancer follow-up.  He is a 73 year old African-American male that underwent a robotic prostatectomy and bilateral lymphadenectomy in January 2018 with Dr. Sherryl Barters.  Final pathology showed Gleason score 3+4=7 prostate cancer stage [id number] Mx, there was focal extraprostatic extension and perineural invasion, margins were negative.  Pretreatment PSA was 6, and PSA after surgery was undetectable.He developed biochemical recurrence in August 2019 with a PSA of 0.2 x2, and underwent salvage radiation to the prostatic fossa and 6 months of ADT(03/08/2018). PSA has been undetectable since that time, including most recently 09/23/2023.   From a urology perspective, minimally bothersome stress urinary incontinence with heavy coughing or sneezing, no pads needed.  We discussed Kegel exercises, as well as consideration of a Cunningham clamp in the future if worsening incontinence.  He is able to get erections with 100 mg sildenafil on demand, this was refilled.   We had a long conversation again about biochemical recurrence after prostatectomy, and the need for close PSA surveillance after salvage radiation.  We also reviewed the plan of monitoring the PSA, with consideration of observation versus other hormonal therapies if PSA recurrence in the future.    Sildenafil 100 mg refilled RTC 1 year PSA prior  Sondra Come, MD  [facility] [address], [address] [place], [place] [address] [phone]

## 2023-09-30 NOTE — Addendum Note (Signed)
 Addended by: Frankey Shown on: 09/30/2023 01:33 PM   Modules accepted: Orders

## 2023-10-15 ENCOUNTER — Encounter: Payer: Self-pay | Admitting: Family Medicine

## 2023-10-15 ENCOUNTER — Ambulatory Visit: Payer: Self-pay | Admitting: Family Medicine

## 2023-10-15 VITALS — BP 132/84 | HR 98 | Resp 16 | Ht 69.0 in | Wt 189.8 lb

## 2023-10-15 DIAGNOSIS — E538 Deficiency of other specified B group vitamins: Secondary | ICD-10-CM

## 2023-10-15 DIAGNOSIS — N2581 Secondary hyperparathyroidism of renal origin: Secondary | ICD-10-CM | POA: Diagnosis not present

## 2023-10-15 DIAGNOSIS — I10 Essential (primary) hypertension: Secondary | ICD-10-CM | POA: Diagnosis not present

## 2023-10-15 DIAGNOSIS — F1021 Alcohol dependence, in remission: Secondary | ICD-10-CM | POA: Diagnosis not present

## 2023-10-15 DIAGNOSIS — R809 Proteinuria, unspecified: Secondary | ICD-10-CM

## 2023-10-15 DIAGNOSIS — D8989 Other specified disorders involving the immune mechanism, not elsewhere classified: Secondary | ICD-10-CM

## 2023-10-15 DIAGNOSIS — I700 Atherosclerosis of aorta: Secondary | ICD-10-CM | POA: Diagnosis not present

## 2023-10-15 DIAGNOSIS — J301 Allergic rhinitis due to pollen: Secondary | ICD-10-CM | POA: Diagnosis not present

## 2023-10-15 DIAGNOSIS — E785 Hyperlipidemia, unspecified: Secondary | ICD-10-CM

## 2023-10-15 DIAGNOSIS — N29 Other disorders of kidney and ureter in diseases classified elsewhere: Secondary | ICD-10-CM

## 2023-10-15 DIAGNOSIS — J410 Simple chronic bronchitis: Secondary | ICD-10-CM

## 2023-10-15 DIAGNOSIS — D472 Monoclonal gammopathy: Secondary | ICD-10-CM

## 2023-10-15 MED ORDER — ROSUVASTATIN CALCIUM 40 MG PO TABS: ORAL_TABLET | ORAL | 1 refills | Status: DC

## 2023-10-15 MED ORDER — FLUTICASONE PROPIONATE 50 MCG/ACT NA SUSP
2.0000 | Freq: Every day | NASAL | 6 refills | Status: AC
Start: 2023-10-15 — End: ?

## 2023-10-15 MED ORDER — AMLODIPINE BESYLATE 5 MG PO TABS
5.0000 mg | ORAL_TABLET | Freq: Every day | ORAL | 1 refills | Status: DC
Start: 2023-10-15 — End: 2024-04-20

## 2023-10-15 MED ORDER — CYANOCOBALAMIN 1000 MCG/ML IJ SOLN
1000.0000 ug | Freq: Once | INTRAMUSCULAR | Status: AC
Start: 2023-10-15 — End: 2023-10-15
  Administered 2023-10-15: 1000 ug via INTRAMUSCULAR

## 2023-10-15 NOTE — Progress Notes (Signed)
 Name: Ernest Sweeney   MRN: [id number]    DOB: 28-May-1951   Date:10/15/2023       Progress Note  Subjective  Chief Complaint  Chief Complaint  Patient presents with   Medical Management of Chronic Issues   HPI   The patient, with a history of craniopharyngioma, underwent debulking of the tumor in November 2023. Postoperatively, he developed adrenal insufficiency and diabetes insipidus, but lost follow-up with his endocrinologist. He was seen by Endo recently and normal labs, no further evaluation needed based on her note.   Smoldering multiple myeloma  he is still under the care of Dr. Orlie Dakin and seems to be doing well    The patient also has a history of chronic kidney disease stage four due to lambda chain myeloma, which has improved to stage 3 A and last levels normal, except for positive microalbuminuria.Marland Kitchen He is currently managed on amlodipine 5mg  with good blood pressure control, nephrologist stopped his lisinopril a long time ago. He also has secondary hyperparathyroidism  Chronic bronchitis: he used to smoke half pack day for 40 plus years, he quit in 2016, he denies a cough, he has occasional wheezing and SOB . He states cannot afford an inhaler at this time   The patient has atherosclerosis of the aorta and is on rosuvastatin. He reports no issues with this medication and his cholesterol level was 79 in July. He also has chronic bronchitis, but denies any current respiratory symptoms. He quit smoking in 2016.   The patient was diagnosed with prostate cancer in 2016 and underwent prostatectomy. He reports some urinary incontinence but denies any other bladder issues. Last visit with urologist was 09/2023 and needs to go yearly for PSA and follow ups   The patient has a history of alcohol use, he has currently drinking about half a pint of rum a couple times a week. He also has a history of major depression, but denies any current depressive symptoms. He is not on any medications for  depression and reports feeling well. 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) 09/30/2022   Other neutropenia (HCC) 12/30/2021   Intermittent low back pain 12/30/2021   Essential hypertension 12/30/2021   Left inguinal hernia 12/30/2021   History of right inguinal hernia repair    Alcoholism (HCC) 06/28/2020   Major depression in remission (HCC) 06/28/2020   Lung nodules 09/04/2018   Chronic bronchitis (HCC) 04/23/2017   Atherosclerosis of aorta (HCC) 04/23/2017   Coronary artery disease due to calcified coronary lesion 04/23/2017   Glaucoma, left eye 08/04/2016   History of prostate cancer 07/15/2016   History of prostatectomy 07/15/2016   Vitamin D deficiency 05/31/2015   Dyslipidemia 05/31/2015   Seasonal allergic rhinitis 05/31/2015   Chronic pain of both shoulders 05/31/2015   Family history of malignant neoplasm of prostate 07/26/2012   Hematuria, microscopic 07/26/2012    Past Surgical History:  Procedure Laterality Date   COLONOSCOPY  07/13/2008   Dr Carollee Massed   COLONOSCOPY WITH PROPOFOL N/A 01/05/2020   Procedure: COLONOSCOPY WITH PROPOFOL;  Surgeon: Wyline Mood, MD;  Location: [facility] ENDOSCOPY;  Service: Gastroenterology;  Laterality:  N/A;   excision skin cyst  06/19/2014   Dr. Cathren Harsh CYST   INGUINAL HERNIA REPAIR Right 09/06/2020   Procedure: HERNIA REPAIR RIGH  INGUINAL WITH MESH ADULT, open with RNFA to assist;  Surgeon: Duanne Guess, MD;  Location: [facility] ORS;  Service: General;  Laterality: Right;  Provider requesting 2 hours/120 minutes for procedure.   Neuroendoscopy  05/28/2022   With excision pituitary tumor transnasal or transphenoidal  approach. Resection of craniopharyngioma.   PELVIC LYMPH NODE DISSECTION N/A 07/15/2016   Procedure: PELVIC LYMPH NODE DISSECTION;  Surgeon: Hildred Laser, MD;  Location: [facility] ORS;  Service: Urology;  Laterality: N/A;   ROBOT ASSISTED LAPAROSCOPIC RADICAL PROSTATECTOMY N/A 07/15/2016   Procedure: ROBOTIC ASSISTED LAPAROSCOPIC RADICAL PROSTATECTOMY;  Surgeon: Hildred Laser, MD;  Location: [facility] ORS;  Service: Urology;  Laterality: N/A;    Family History  Problem Relation Age of Onset   Heart disease Mother    Heart attack Mother    Diabetes Father    Heart attack Father    Dementia Father    Cancer Sister        Breast   Cancer Brother        Prostate   Healthy Sister    Stroke Brother     Social History   Tobacco Use   Smoking status: Former    Current packs/day: 0.00    Average packs/day: 0.5 packs/day for 40.0 years (20.0 ttl pk-yrs)    Types: Cigarettes    Start date: 03/23/1976    Quit date: 03/23/2016    Years since quitting: 7.5   Smokeless tobacco: Never  Substance Use Topics   Alcohol use: Yes    Alcohol/week: 2.0 - 4.0 standard drinks of alcohol    Types: 2 - 4 Standard drinks or equivalent per week    Comment: 1-2 times per week     Current Outpatient Medications:    amLODipine (NORVASC) 5 MG tablet, Take 1 tablet by mouth once daily, Disp: 90 tablet, Rfl: 0   aspirin 81 MG tablet, Take 81 mg by mouth daily., Disp: , Rfl:    brimonidine-timolol (COMBIGAN) 0.2-0.5 % ophthalmic solution, Place 1 drop into both eyes every 12 (twelve) hours., Disp: , Rfl:    Cholecalciferol (VITAMIN D) 2000 units CAPS, Take 1 capsule (2,000 Units total) by mouth daily., Disp: 30 capsule, Rfl: 0   fluticasone (FLONASE) 50 MCG/ACT nasal spray, Place 2 sprays into both nostrils daily., Disp: 16 g, Rfl: 6   latanoprost (XALATAN) 0.005 % ophthalmic solution, Place 1 drop into both eyes at bedtime. , Disp: , Rfl:    nortriptyline (PAMELOR) 10 MG capsule, Take 20 mg by mouth at  bedtime., Disp: , Rfl:    rosuvastatin (CRESTOR) 40 MG tablet, TAKE 1 TABLET BY MOUTH ONCE DAILY .IN  PLACE  OF  ATORVASTATIN, Disp: 90 tablet, Rfl: 0   sildenafil (VIAGRA) 100 MG tablet, Take 1 tablet (100 mg total) by mouth daily as needed for erectile dysfunction., Disp: 30 tablet, Rfl: 6   triamcinolone cream (KENALOG) 0.1 %, APPLY TOPICALLY AS DIRECTED TWICE DAILY, Disp: 45 g, Rfl: 0  No Known Allergies  I personally reviewed active problem list, medication list, allergies with the patient/caregiver today.   ROS  Ten systems reviewed and is negative except as mentioned in HPI    Objective Physical Exam Constitutional: Patient appears well-developed and well-nourished. No distress.  HEENT: head atraumatic, normocephalic, pupils equal and reactive to light, neck supple Cardiovascular: Normal rate, regular rhythm  and normal heart sounds.  No murmur heard. No BLE edema. Pulmonary/Chest: Effort normal and breath sounds normal. No respiratory distress. Abdominal: Soft.  There is no tenderness. Psychiatric: Patient has a normal mood and affect. behavior is normal. Judgment and thought content normal.   Vitals:   10/15/23 0817  BP: 132/84  Pulse: 98  Resp: 16  SpO2: 100%  Weight: 189 lb 12.8 oz (86.1 kg)  Height: 5\' 9"  (1.753 m)    Body mass index is 28.03 kg/m.  Recent Results (from the past 2160 hours)  PSA     Status: None   Collection Time: 09/23/23 10:13 AM  Result Value Ref Range   Prostate Specific Ag, Serum <0.1 0.0 - 4.0 ng/mL    Comment: Roche ECLIA methodology. According to the American Urological Association, Serum PSA should decrease and remain at undetectable levels after radical prostatectomy. The AUA defines biochemical recurrence as an initial PSA value 0.2 ng/mL or greater followed by a subsequent confirmatory PSA value 0.2 ng/mL or greater. Values obtained with different assay methods or kits cannot be used interchangeably. Results cannot be interpreted  as absolute evidence of the presence or absence of malignant disease.     Diabetic Foot Exam:     PHQ2/9:    10/15/2023    8:14 AM 07/20/2023   11:17 AM 06/14/2023    8:07 AM 04/13/2023    8:12 AM 02/08/2023   11:08 AM  Depression screen PHQ 2/9  Decreased Interest 0 0 0 0 0  Down, Depressed, Hopeless 0 0 0 0 0  PHQ - 2 Score 0 0 0 0 0  Altered sleeping 0 0 0 0 0  Tired, decreased energy 0 0 0 0 0  Change in appetite 0 0 0 0 0  Feeling bad or failure about yourself  0 0 0 0 0  Trouble concentrating 0 0 0 0 0  Moving slowly or fidgety/restless 0 0 0 0 0  Suicidal thoughts 0 0 0 0 0  PHQ-9 Score 0 0 0 0 0  Difficult doing work/chores Not difficult at all Not difficult at all Not difficult at all Not difficult at all     phq 9 is negative  Fall Risk:    10/15/2023    8:14 AM 07/20/2023   11:17 AM 06/14/2023    8:05 AM 04/13/2023    8:12 AM 02/08/2023   11:07 AM  Fall Risk   Falls in the past year? 0 0 0 1 0  Number falls in past yr: 0 0 0 0 0  Injury with Fall? 0 0 0 0 0  Risk for fall due to : No Fall Risks No Fall Risks  Impaired balance/gait No Fall Risks  Follow up Falls prevention discussed;Education provided;Falls evaluation completed Falls prevention discussed;Education provided;Falls evaluation completed  Falls evaluation completed;Education provided;Falls prevention discussed Falls prevention discussed     Assessment & Plan  1. Atherosclerosis of aorta (HCC) (Primary)  - rosuvastatin (CRESTOR) 40 MG tablet; TAKE 1 TABLET BY MOUTH ONCE DAILY IN  PLACE  OF  ATORVASTATIN  Dispense: 90 tablet; Refill: 1  2. Simple chronic bronchitis (HCC)  No longer smoking  3. History of alcoholism (HCC)  Advised to cut down on alcohol   4. Disorder of kidney due to lambda light chain disease (HCC)  Improving   5. Secondary hyperparathyroidism of renal origin [facility])  Doing well   6. Microalbuminuria  Under the care of nephrologist   7. B12 deficiency  -  cyanocobalamin  (VITAMIN B12) injection 1,000 mcg  8. Dyslipidemia  - rosuvastatin (CRESTOR) 40 MG tablet; TAKE 1 TABLET BY MOUTH ONCE DAILY IN  PLACE  OF  ATORVASTATIN  Dispense: 90 tablet; Refill: 1  9. Smoldering multiple myeloma  Keep visits with Dr. Orlie Dakin  10. Essential hypertension  - amLODipine (NORVASC) 5 MG tablet; Take 1 tablet (5 mg total) by mouth daily.  Dispense: 90 tablet; Refill: 1   12. Seasonal allergic rhinitis due to pollen  - fluticasone (FLONASE) 50 MCG/ACT nasal spray; Place 2 sprays into both nostrils daily.  Dispense: 16 g; Refill: 6

## 2023-11-15 ENCOUNTER — Ambulatory Visit

## 2023-11-15 DIAGNOSIS — E538 Deficiency of other specified B group vitamins: Secondary | ICD-10-CM | POA: Diagnosis not present

## 2023-11-15 MED ORDER — CYANOCOBALAMIN 1000 MCG/ML IJ SOLN
1000.0000 ug | Freq: Once | INTRAMUSCULAR | Status: AC
  Administered 2023-11-15: 1000 ug via INTRAMUSCULAR

## 2023-11-15 NOTE — Progress Notes (Signed)
 Patient is in office today for a nurse visit for B12 Injection. Patient Injection was given in the  Left deltoid. Patient tolerated injection well.

## 2023-11-22 ENCOUNTER — Other Ambulatory Visit: Payer: Self-pay | Admitting: Neurosurgery

## 2023-11-22 DIAGNOSIS — D497 Neoplasm of unspecified behavior of endocrine glands and other parts of nervous system: Secondary | ICD-10-CM

## 2023-12-02 ENCOUNTER — Ambulatory Visit
Admission: RE | Admit: 2023-12-02 | Discharge: 2023-12-02 | Disposition: A | Discharge status: Home / Self Care | Source: Ambulatory Visit | Attending: Neurosurgery | Admitting: Neurosurgery

## 2023-12-02 DIAGNOSIS — J323 Chronic sphenoidal sinusitis: Secondary | ICD-10-CM | POA: Diagnosis not present

## 2023-12-02 DIAGNOSIS — G939 Disorder of brain, unspecified: Secondary | ICD-10-CM | POA: Diagnosis not present

## 2023-12-02 DIAGNOSIS — G319 Degenerative disease of nervous system, unspecified: Secondary | ICD-10-CM | POA: Diagnosis not present

## 2023-12-02 DIAGNOSIS — H2513 Age-related nuclear cataract, bilateral: Secondary | ICD-10-CM | POA: Diagnosis not present

## 2023-12-02 DIAGNOSIS — H401133 Primary open-angle glaucoma, bilateral, severe stage: Secondary | ICD-10-CM | POA: Diagnosis not present

## 2023-12-02 DIAGNOSIS — D497 Neoplasm of unspecified behavior of endocrine glands and other parts of nervous system: Secondary | ICD-10-CM | POA: Diagnosis not present

## 2023-12-02 DIAGNOSIS — D352 Benign neoplasm of pituitary gland: Secondary | ICD-10-CM | POA: Diagnosis not present

## 2023-12-02 MED ORDER — GADOBUTROL 1 MMOL/ML IV SOLN
7.5000 mL | Freq: Once | INTRAVENOUS | Status: AC | PRN
  Administered 2023-12-02: 7.5 mL via INTRAVENOUS

## 2023-12-07 ENCOUNTER — Other Ambulatory Visit: Payer: Self-pay | Admitting: *Deleted

## 2023-12-07 DIAGNOSIS — D472 Monoclonal gammopathy: Secondary | ICD-10-CM

## 2023-12-08 ENCOUNTER — Inpatient Hospital Stay: Payer: Medicare HMO | Attending: Radiation Oncology

## 2023-12-08 DIAGNOSIS — D444 Neoplasm of uncertain behavior of craniopharyngeal duct: Secondary | ICD-10-CM | POA: Diagnosis not present

## 2023-12-08 DIAGNOSIS — D472 Monoclonal gammopathy: Secondary | ICD-10-CM | POA: Diagnosis not present

## 2023-12-08 DIAGNOSIS — C61 Malignant neoplasm of prostate: Secondary | ICD-10-CM

## 2023-12-08 DIAGNOSIS — Z79899 Other long term (current) drug therapy: Secondary | ICD-10-CM | POA: Diagnosis not present

## 2023-12-08 LAB — CMP (CANCER CENTER ONLY)
ALT: 17 U/L (ref 0–44)
AST: 20 U/L (ref 15–41)
Albumin: 4.3 g/dL (ref 3.5–5.0)
Alkaline Phosphatase: 56 U/L (ref 38–126)
Anion gap: 8 (ref 5–15)
BUN: 17 mg/dL (ref 8–23)
CO2: 25 mmol/L (ref 22–32)
Calcium: 9.3 mg/dL (ref 8.9–10.3)
Chloride: 103 mmol/L (ref 98–111)
Creatinine: 1.37 mg/dL — ABNORMAL HIGH (ref 0.61–1.24)
GFR, Estimated: 55 mL/min — ABNORMAL LOW (ref >60)
Glucose, Bld: 75 mg/dL (ref 70–99)
Potassium: 3.9 mmol/L (ref 3.5–5.1)
Sodium: 136 mmol/L (ref 135–145)
Total Bilirubin: 1.1 mg/dL (ref 0.0–1.2)
Total Protein: 7.4 g/dL (ref 6.5–8.1)

## 2023-12-08 LAB — CBC WITH DIFFERENTIAL/PLATELET
Abs Immature Granulocytes: 0.01 10*3/uL (ref 0.00–0.07)
Basophils Absolute: 0 10*3/uL (ref 0.0–0.1)
Basophils Relative: 1 %
Eosinophils Absolute: 0.1 10*3/uL (ref 0.0–0.5)
Eosinophils Relative: 3 %
HCT: 47.3 % (ref 39.0–52.0)
Hemoglobin: 16.2 g/dL (ref 13.0–17.0)
Immature Granulocytes: 0 %
Lymphocytes Relative: 26 %
Lymphs Abs: 0.8 10*3/uL (ref 0.7–4.0)
MCH: 32.4 pg (ref 26.0–34.0)
MCHC: 34.2 g/dL (ref 30.0–36.0)
MCV: 94.6 fL (ref 80.0–100.0)
Monocytes Absolute: 0.4 10*3/uL (ref 0.1–1.0)
Monocytes Relative: 12 %
Neutro Abs: 1.8 10*3/uL (ref 1.7–7.7)
Neutrophils Relative %: 58 %
Platelets: 321 10*3/uL (ref 150–400)
RBC: 5 MIL/uL (ref 4.22–5.81)
RDW: 12.5 % (ref 11.5–15.5)
WBC: 3.2 10*3/uL — ABNORMAL LOW (ref 4.0–10.5)
nRBC: 0 % (ref 0.0–0.2)

## 2023-12-08 LAB — PSA: Prostatic Specific Antigen: <0.01 ng/mL (ref 0.00–4.00)

## 2023-12-09 LAB — PROTEIN ELECTROPHORESIS, SERUM
A/G Ratio: 1.5 (ref 0.7–1.7)
Albumin ELP: 4.1 g/dL (ref 2.9–4.4)
Alpha-1-Globulin: 0.2 g/dL (ref 0.0–0.4)
Alpha-2-Globulin: 0.6 g/dL (ref 0.4–1.0)
Beta Globulin: 0.9 g/dL (ref 0.7–1.3)
Gamma Globulin: 1 g/dL (ref 0.4–1.8)
Globulin, Total: 2.8 g/dL (ref 2.2–3.9)
M-Spike, %: 0.5 g/dL — ABNORMAL HIGH
Total Protein ELP: 6.9 g/dL (ref 6.0–8.5)

## 2023-12-09 LAB — IGG, IGA, IGM
IgA: 42 mg/dL — ABNORMAL LOW (ref 61–437)
IgG (Immunoglobin G), Serum: 956 mg/dL (ref 603–1613)
IgM (Immunoglobulin M), Srm: 290 mg/dL — ABNORMAL HIGH (ref 15–143)

## 2023-12-09 LAB — KAPPA/LAMBDA LIGHT CHAINS
Kappa free light chain: 14.1 mg/L (ref 3.3–19.4)
Kappa, lambda light chain ratio: 0.08 — ABNORMAL LOW (ref 0.26–1.65)
Lambda free light chains: 179.5 mg/L — ABNORMAL HIGH (ref 5.7–26.3)

## 2023-12-15 ENCOUNTER — Inpatient Hospital Stay: Payer: Medicare HMO | Attending: Oncology | Admitting: Oncology

## 2023-12-15 ENCOUNTER — Encounter: Payer: Self-pay | Admitting: Oncology

## 2023-12-15 VITALS — BP 134/95 | HR 95 | Temp 96.5°F | Resp 16 | Wt 194.0 lb

## 2023-12-15 DIAGNOSIS — D472 Monoclonal gammopathy: Secondary | ICD-10-CM | POA: Diagnosis not present

## 2023-12-15 DIAGNOSIS — N189 Chronic kidney disease, unspecified: Secondary | ICD-10-CM | POA: Insufficient documentation

## 2023-12-15 DIAGNOSIS — Z79899 Other long term (current) drug therapy: Secondary | ICD-10-CM | POA: Insufficient documentation

## 2023-12-15 DIAGNOSIS — D72819 Decreased white blood cell count, unspecified: Secondary | ICD-10-CM | POA: Insufficient documentation

## 2023-12-15 DIAGNOSIS — Z8042 Family history of malignant neoplasm of prostate: Secondary | ICD-10-CM | POA: Diagnosis not present

## 2023-12-15 DIAGNOSIS — Z8546 Personal history of malignant neoplasm of prostate: Secondary | ICD-10-CM | POA: Insufficient documentation

## 2023-12-15 DIAGNOSIS — Z803 Family history of malignant neoplasm of breast: Secondary | ICD-10-CM | POA: Diagnosis not present

## 2023-12-15 DIAGNOSIS — Z87891 Personal history of nicotine dependence: Secondary | ICD-10-CM | POA: Diagnosis not present

## 2023-12-15 NOTE — Progress Notes (Signed)
 [facility]  [phone]) [phone] [phone]  ID: Ernest Sweeney OB: 04-07-1951  MR#: [id number]  AOZ#:[id number]  Patient Care Team: Sowles, Krichna, MD as PCP - General (Family Medicine) Geraline Knapp, MD as Consulting Physician (Urology) Lawerence Pressman, MD as Consulting Physician (Urology) Glenis Langdon, MD as Referring Physician (Radiation Oncology) Antonette Batters, MD as Consulting Physician (Cardiology) Shellie Dials, MD as Consulting Physician (Oncology)  CHIEF COMPLAINT: Smoldering lambda chain myeloma with q13-.  INTERVAL HISTORY: Patient returns to clinic today for repeat laboratory work and routine 70-month evaluation.  He continues to feel well and remains asymptomatic. He has no neurologic complaints. He denies any recent fevers or illnesses.  He has a good appetite and denies weight loss.  He has no chest pain, shortness of breath, cough, or hemoptysis.  He denies any nausea, vomiting, constipation, or diarrhea.  He has no urinary complaints.  Patient feels at his baseline and offers no specific complaints today.  REVIEW OF SYSTEMS:   Review of Systems  Constitutional: Negative.  Negative for fever, malaise/fatigue and weight loss.  Eyes:  Negative for blurred vision and double vision.  Respiratory: Negative.  Negative for cough, hemoptysis and shortness of breath.   Cardiovascular: Negative.  Negative for chest pain and leg swelling.  Gastrointestinal: Negative.  Negative for abdominal pain.  Genitourinary: Negative.  Negative for dysuria.  Musculoskeletal: Negative.  Negative for back pain.  Skin: Negative.  Negative for rash.  Neurological: Negative.  Negative for dizziness, focal weakness, weakness and headaches.  Psychiatric/Behavioral: Negative.  The patient is not nervous/anxious.     As per HPI. Otherwise, a complete review of systems is negative.  PAST MEDICAL HISTORY: Past Medical History:  Diagnosis Date    Anxiety    Panic attacks in the past   Arthritis    Benign prostatic hypertrophy without lower urinary tract symptoms    Cancer (HCC)    Prostate. monitoring now since surgery   Colon polyp    GERD (gastroesophageal reflux disease)    Glaucoma (increased eye pressure)    Left eye only   Hyperlipidemia    Hypertension     PAST SURGICAL HISTORY: Past Surgical History:  Procedure Laterality Date   COLONOSCOPY  07/13/2008   Dr Benedetto Brady   COLONOSCOPY WITH PROPOFOL  N/A 01/05/2020   Procedure: COLONOSCOPY WITH PROPOFOL ;  Surgeon: Luke Salaam, MD;  Location: [facility] ENDOSCOPY;  Service: Gastroenterology;  Laterality: N/A;   excision skin cyst  06/19/2014   Dr. Kristin Peyer CYST   INGUINAL HERNIA REPAIR Right 09/06/2020   Procedure: HERNIA REPAIR RIGH  INGUINAL WITH MESH ADULT, open with RNFA to assist;  Surgeon: Mercy Stall, MD;  Location: [facility] ORS;  Service: General;  Laterality: Right;  Provider requesting 2 hours/120 minutes for procedure.   Neuroendoscopy  05/28/2022   With excision pituitary tumor transnasal or transphenoidal approach. Resection of craniopharyngioma.   PELVIC LYMPH NODE DISSECTION N/A 07/15/2016   Procedure: PELVIC LYMPH NODE DISSECTION;  Surgeon: Bart Born, MD;  Location: [facility] ORS;  Service: Urology;  Laterality: N/A;   ROBOT ASSISTED LAPAROSCOPIC RADICAL PROSTATECTOMY N/A 07/15/2016   Procedure: ROBOTIC ASSISTED LAPAROSCOPIC RADICAL PROSTATECTOMY;  Surgeon: Bart Born, MD;  Location: [facility] ORS;  Service: Urology;  Laterality: N/A;    FAMILY HISTORY: Family History  Problem Relation Age of Onset   Heart disease Mother    Heart attack Mother    Diabetes Father    Heart attack Father    Dementia Father  Cancer Sister        Breast   Cancer Brother        Prostate   Healthy Sister    Stroke Brother     ADVANCED DIRECTIVES (Y/N):  N  HEALTH MAINTENANCE: Social History   Tobacco Use   Smoking status: Former    Current  packs/day: 0.00    Average packs/day: 0.5 packs/day for 40.0 years (20.0 ttl pk-yrs)    Types: Cigarettes    Start date: 03/23/1976    Quit date: 03/23/2016    Years since quitting: 7.7   Smokeless tobacco: Never  Vaping Use   Vaping status: Never Used  Substance Use Topics   Alcohol use: Yes    Alcohol/week: 2.0 - 4.0 standard drinks of alcohol    Types: 2 - 4 Standard drinks or equivalent per week    Comment: 1-2 times per week   Drug use: No     Colonoscopy:  PAP:  Bone density:  Lipid panel:  No Known Allergies  Current Outpatient Medications  Medication Sig Dispense Refill   amLODipine  (NORVASC ) 5 MG tablet Take 1 tablet (5 mg total) by mouth daily. 90 tablet 1   aspirin 81 MG tablet Take 81 mg by mouth daily.     brimonidine-timolol (COMBIGAN) 0.2-0.5 % ophthalmic solution Place 1 drop into both eyes every 12 (twelve) hours.     Cholecalciferol (VITAMIN D ) 2000 units CAPS Take 1 capsule (2,000 Units total) by mouth daily. 30 capsule 0   fluticasone  (FLONASE ) 50 MCG/ACT nasal spray Place 2 sprays into both nostrils daily. 16 g 6   latanoprost  (XALATAN ) 0.005 % ophthalmic solution Place 1 drop into both eyes at bedtime.      nortriptyline (PAMELOR) 10 MG capsule Take 20 mg by mouth at bedtime.     rosuvastatin  (CRESTOR ) 40 MG tablet TAKE 1 TABLET BY MOUTH ONCE DAILY IN  PLACE  OF  ATORVASTATIN  90 tablet 1   sildenafil  (VIAGRA ) 100 MG tablet Take 1 tablet (100 mg total) by mouth daily as needed for erectile dysfunction. 30 tablet 6   triamcinolone  cream (KENALOG ) 0.1 % APPLY TOPICALLY AS DIRECTED TWICE DAILY 45 g 0   No current facility-administered medications for this visit.    OBJECTIVE: Vitals:   12/15/23 1122  BP: (!) 134/95  Pulse: 95  Resp: 16  Temp: (!) 96.5 F (35.8 C)  SpO2: 97%     Body mass index is 28.65 kg/m.    ECOG FS:0 - Asymptomatic  General: Well-developed, well-nourished, no acute distress. Eyes: Pink conjunctiva, anicteric sclera. HEENT:  Normocephalic, moist mucous membranes. Lungs: No audible wheezing or coughing. Heart: Regular rate and rhythm. Abdomen: Soft, nontender, no obvious distention. Musculoskeletal: No edema, cyanosis, or clubbing. Neuro: Alert, answering all questions appropriately. Cranial nerves grossly intact. Skin: No rashes or petechiae noted. Psych: Normal affect.  LAB RESULTS:  Lab Results  Component Value Date   NA 136 12/08/2023   K 3.9 12/08/2023   CL 103 12/08/2023   CO2 25 12/08/2023   GLUCOSE 75 12/08/2023   BUN 17 12/08/2023   CREATININE 1.37 (H) 12/08/2023   CALCIUM  9.3 12/08/2023   PROT 7.4 12/08/2023   ALBUMIN 4.3 12/08/2023   AST 20 12/08/2023   ALT 17 12/08/2023   ALKPHOS 56 12/08/2023   BILITOT 1.1 12/08/2023   GFRNONAA 55 (L) 12/08/2023   GFRAA 74 05/02/2020    Lab Results  Component Value Date   WBC 3.2 (L) 12/08/2023   NEUTROABS 1.8  12/08/2023   HGB 16.2 12/08/2023   HCT 47.3 12/08/2023   MCV 94.6 12/08/2023   PLT 321 12/08/2023     STUDIES: No results found.  ASSESSMENT: Smoldering lambda chain myeloma with q13-, MYD88 mutation.  PLAN:    Smoldering lambda chain myeloma: Bone marrow biopsy completed on August 19, 2022 revealed approximately 25% plasma cells.  Cytogenetics reviewed q13 - mutation. The MYD 88 mutation is associated with IgM myeloma as well as Wal[place]strm's and indicates an increased risk to progression to overt myeloma.  Patient's M spike has ranged from 0.3-0.5 since August 10, 2022.  Today's result is 0.5.  His IgM component has ranged from 285-305.  His most recent result is 290.  His lambda free light chains have ranged from 166.4-227.6 over the same timeframe.  His most recent result is 179.5.  He continues to have a mild but stable renal insufficiency and a mild chronic leukopenia, but no other evidence of endorgan damage. Previously, case discussed with nephrology.  No treatment is needed at this time.  Patient expressed understanding that  he may require treatment in the future, but this is not necessary at this time.  Return to clinic in 6 months with repeat laboratory work and further evaluation.   Chronic renal insufficiency: Chronic and unchanged.  Patient's most recent creatinine is 1.37.  Continue follow-up with nephrology as indicated.  Bone marrow biopsy as above.   Leukopenia: Chronic and unchanged.   History of pituitary tumor: Patient underwent surgical resection at [facility] in November 2023.  Continue follow-up and imaging with MRI with [facility] neurosurgery. History of prostate cancer: Patient underwent prostatectomy followed by XRT for local control.  PSA is monitored by primary care.    Patient expressed understanding and was in agreement with this plan. He also understands that He can call clinic at any time with any questions, concerns, or complaints.    Cancer Staging  No matching staging information was found for the patient.   Shellie Dials, MD   12/15/2023 2:30 PM

## 2023-12-16 ENCOUNTER — Ambulatory Visit: Admitting: Family Medicine

## 2023-12-16 ENCOUNTER — Ambulatory Visit (INDEPENDENT_AMBULATORY_CARE_PROVIDER_SITE_OTHER)

## 2023-12-16 DIAGNOSIS — E538 Deficiency of other specified B group vitamins: Secondary | ICD-10-CM | POA: Diagnosis not present

## 2023-12-16 MED ORDER — CYANOCOBALAMIN 1000 MCG/ML IJ SOLN
1000.0000 ug | Freq: Once | INTRAMUSCULAR | Status: AC
Start: 2023-12-16 — End: 2023-12-16
  Administered 2023-12-16: 1000 ug via INTRAMUSCULAR

## 2023-12-16 NOTE — Progress Notes (Signed)
 Patient is in office today for a nurse visit for B12 Injection. Patient Injection was given in the  Left deltoid. Patient tolerated injection well.

## 2023-12-17 ENCOUNTER — Encounter

## 2023-12-30 DIAGNOSIS — I700 Atherosclerosis of aorta: Secondary | ICD-10-CM | POA: Diagnosis not present

## 2023-12-30 DIAGNOSIS — Z8659 Personal history of other mental and behavioral disorders: Secondary | ICD-10-CM | POA: Diagnosis not present

## 2023-12-30 DIAGNOSIS — I2510 Atherosclerotic heart disease of native coronary artery without angina pectoris: Secondary | ICD-10-CM | POA: Diagnosis not present

## 2023-12-30 DIAGNOSIS — I10 Essential (primary) hypertension: Secondary | ICD-10-CM | POA: Diagnosis not present

## 2023-12-30 DIAGNOSIS — E785 Hyperlipidemia, unspecified: Secondary | ICD-10-CM | POA: Diagnosis not present

## 2023-12-30 DIAGNOSIS — F1010 Alcohol abuse, uncomplicated: Secondary | ICD-10-CM | POA: Diagnosis not present

## 2023-12-30 DIAGNOSIS — J449 Chronic obstructive pulmonary disease, unspecified: Secondary | ICD-10-CM | POA: Diagnosis not present

## 2023-12-31 ENCOUNTER — Other Ambulatory Visit: Payer: Self-pay | Admitting: Internal Medicine

## 2023-12-31 DIAGNOSIS — E785 Hyperlipidemia, unspecified: Secondary | ICD-10-CM

## 2023-12-31 DIAGNOSIS — I2510 Atherosclerotic heart disease of native coronary artery without angina pectoris: Secondary | ICD-10-CM

## 2024-01-13 ENCOUNTER — Ambulatory Visit

## 2024-01-13 ENCOUNTER — Ambulatory Visit
Admission: RE | Admit: 2024-01-13 | Discharge: 2024-01-13 | Disposition: A | Discharge status: Home / Self Care | Payer: Self-pay | Source: Ambulatory Visit | Attending: Internal Medicine | Admitting: Internal Medicine

## 2024-01-13 DIAGNOSIS — I2510 Atherosclerotic heart disease of native coronary artery without angina pectoris: Secondary | ICD-10-CM | POA: Insufficient documentation

## 2024-01-13 DIAGNOSIS — E785 Hyperlipidemia, unspecified: Secondary | ICD-10-CM | POA: Insufficient documentation

## 2024-01-17 ENCOUNTER — Ambulatory Visit

## 2024-01-17 ENCOUNTER — Ambulatory Visit (INDEPENDENT_AMBULATORY_CARE_PROVIDER_SITE_OTHER)

## 2024-01-17 DIAGNOSIS — E538 Deficiency of other specified B group vitamins: Secondary | ICD-10-CM

## 2024-01-17 MED ORDER — CYANOCOBALAMIN 1000 MCG/ML IJ SOLN
1000.0000 ug | Freq: Once | INTRAMUSCULAR | Status: AC
  Administered 2024-01-17: 1000 ug via INTRAMUSCULAR

## 2024-02-04 ENCOUNTER — Ambulatory Visit

## 2024-02-04 VITALS — BP 134/95 | Ht 69.0 in | Wt 195.0 lb

## 2024-02-04 DIAGNOSIS — Z2821 Immunization not carried out because of patient refusal: Secondary | ICD-10-CM

## 2024-02-04 DIAGNOSIS — Z0000 Encounter for general adult medical examination without abnormal findings: Secondary | ICD-10-CM

## 2024-02-04 NOTE — Progress Notes (Signed)
 Because this visit was a virtual/telehealth visit,  certain criteria was not obtained, such a blood pressure, CBG if applicable, and timed get up and go. Any medications not marked as taking were not mentioned during the medication reconciliation part of the visit. Any vitals not documented were not able to be obtained due to this being a telehealth visit or patient was unable to self-report a recent blood pressure reading due to a lack of equipment at home via telehealth. Vitals that have been documented are verbally provided by the patient.  This visit was performed by a medical professional under my direct supervision. I was immediately available for consultation/collaboration. I have reviewed and agree with the Annual Wellness Visit documentation.  Subjective:   Ernest Sweeney is a 73 y.o. who presents for a Medicare Wellness preventive visit.  As a reminder, Annual Wellness Visits don't include a physical exam, and some assessments may be limited, especially if this visit is performed virtually. We may recommend an in-person follow-up visit with your provider if needed.  Visit Complete: Virtual I connected with  Ernest Sweeney on 02/04/24 by a audio enabled telemedicine application and verified that I am speaking with the correct person using two identifiers.  Patient Location: Home  Provider Location: Home Office  I discussed the limitations of evaluation and management by telemedicine. The patient expressed understanding and agreed to proceed.  Vital Signs: Because this visit was a virtual/telehealth visit, some criteria may be missing or patient reported. Any vitals not documented were not able to be obtained and vitals that have been documented are patient reported.  VideoDeclined- This patient declined Librarian, academic. Therefore the visit was completed with audio only.  Persons Participating in Visit: Patient.  AWV Questionnaire: No: Patient Medicare  AWV questionnaire was not completed prior to this visit.  Cardiac Risk Factors include: advanced age (>57men, >44 women);dyslipidemia;hypertension;male gender     Objective:    Today's Vitals   02/04/24 0906 02/04/24 0907  BP: (!) 134/95   Weight: 195 lb (88.5 kg)   Height: 5' 9 (1.753 m)   PainSc:  5    Body mass index is 28.8 kg/m.     02/04/2024    9:11 AM 06/14/2023   10:16 AM 04/06/2023    5:55 PM 12/10/2022    8:59 AM 12/02/2022   10:28 AM 12/02/2022   10:21 AM 10/15/2022    9:18 AM  Advanced Directives  Does Patient Have a Medical Advance Directive? No Yes No No No No No  Would patient like information on creating a medical advance directive? No - Patient declined      No - Patient declined    Current Medications (verified) Outpatient Encounter Medications as of 02/04/2024  Medication Sig   amLODipine  (NORVASC ) 5 MG tablet Take 1 tablet (5 mg total) by mouth daily.   aspirin 81 MG tablet Take 81 mg by mouth daily.   brimonidine-timolol (COMBIGAN) 0.2-0.5 % ophthalmic solution Place 1 drop into both eyes every 12 (twelve) hours.   Cholecalciferol (VITAMIN D ) 2000 units CAPS Take 1 capsule (2,000 Units total) by mouth daily.   fluticasone  (FLONASE ) 50 MCG/ACT nasal spray Place 2 sprays into both nostrils daily.   latanoprost  (XALATAN ) 0.005 % ophthalmic solution Place 1 drop into both eyes at bedtime.    nortriptyline (PAMELOR) 10 MG capsule Take 20 mg by mouth at bedtime.   rosuvastatin  (CRESTOR ) 40 MG tablet TAKE 1 TABLET BY MOUTH ONCE DAILY IN  PLACE  OF  ATORVASTATIN    sildenafil  (VIAGRA ) 100 MG tablet Take 1 tablet (100 mg total) by mouth daily as needed for erectile dysfunction.   triamcinolone  cream (KENALOG ) 0.1 % APPLY TOPICALLY AS DIRECTED TWICE DAILY   No facility-administered encounter medications on file as of 02/04/2024.    Allergies (verified) Patient has no known allergies.   History: Past Medical History:  Diagnosis Date   Anxiety    Panic attacks  in the past   Arthritis    Benign prostatic hypertrophy without lower urinary tract symptoms    Cancer (HCC)    Prostate. monitoring now since surgery   Colon polyp    GERD (gastroesophageal reflux disease)    Glaucoma (increased eye pressure)    Left eye only   Hyperlipidemia    Hypertension    Past Surgical History:  Procedure Laterality Date   COLONOSCOPY  07/13/2008   Dr Philip   COLONOSCOPY WITH PROPOFOL  N/A 01/05/2020   Procedure: COLONOSCOPY WITH PROPOFOL ;  Surgeon: Therisa Bi, MD;  [facility] END[facility]COPY;  Service: Gastroenterology;  Laterality: N/A;   excision skin cyst  06/19/2014   Dr. Heloise CYST   INGUINAL HERNIA REPAIR Right 09/06/2020   Procedure: HERNIA REPAIR RIGH  INGUINAL WITH MESH ADULT, open with RNFA to assist;  Surgeon: Marolyn Nest, MD;  Location: [facility] ORS;  Service: General;  Laterality: Right;  Provider requesting 2 hours/120 minutes for procedure.   Neuroendoscopy  05/28/2022   With excision pituitary tumor transnasal or transphenoidal approach. Resection of craniopharyngioma.   PELVIC LYMPH NODE DISSECTION N/A 07/15/2016   Procedure: PELVIC LYMPH NODE DISSECTION;  Surgeon: Redell Lynwood Napoleon, MD;  Location: [facility] ORS;  Service: Urology;  Laterality: N/A;   ROBOT ASSISTED LAPAROSCOPIC RADICAL PROSTATECTOMY N/A 07/15/2016   Procedure: ROBOTIC ASSISTED LAPAROSCOPIC RADICAL PROSTATECTOMY;  Surgeon: Redell Lynwood Napoleon, MD;  Location: [facility] ORS;  Service: Urology;  Laterality: N/A;   Family History  Problem Relation Age of Onset   Heart disease Mother    Heart attack Mother    Diabetes Father    Heart attack Father    Dementia Father    Cancer Sister        Breast   Cancer Brother        Prostate   Healthy Sister    Stroke Brother    Social History   Socioeconomic History   Marital status: Widowed    Spouse name: Not on file   Number of children: 0   Years of education: Not on file   Highest education level: 12th grade   Occupational History   Occupation: custodian     Comment: part time   Tobacco Use   Smoking status: Former    Current packs/day: 0.00    Average packs/day: 0.5 packs/day for 40.0 years (20.0 ttl pk-yrs)    Types: Cigarettes    Start date: 03/23/1976    Quit date: 03/23/2016    Years since quitting: 7.8   Smokeless tobacco: Never  Vaping Use   Vaping status: Never Used  Substance and Sexual Activity   Alcohol use: Yes    Alcohol/week: 2.0 - 4.0 standard drinks of alcohol    Types: 2 - 4 Standard drinks or equivalent per week    Comment: 1-2 times per week   Drug use: No   Sexual activity: Yes    Birth control/protection: Condom  Other Topics Concern   Not on file  Social History Narrative   Patient lives alone.    He will  be staying with his sister after surgery.   Social Drivers of Corporate investment banker Strain: Low Risk  (02/04/2024)   Overall Financial Resource Strain (CARDIA)    Difficulty of Paying Living Expenses: Not hard at all  Food Insecurity: Food Insecurity Present (02/04/2024)   Hunger Vital Sign    Worried About Running Out of Food in the Last Year: Sometimes true    Ran Out of Food in the Last Year: Sometimes true  Transportation Needs: No Transportation Needs (02/04/2024)   PRAPARE - Administrator, Civil Service (Medical): No    Lack of Transportation (Non-Medical): No  Physical Activity: Insufficiently Active (02/04/2024)   Exercise Vital Sign    Days of Exercise per Week: 2 days    Minutes of Exercise per Session: 30 min  Stress: No Stress Concern Present (02/04/2024)   [employer] of Occupational Health - Occupational Stress Questionnaire    Feeling of Stress: Not at all  Social Connections: Socially Isolated (02/04/2024)   Social Connection and Isolation Panel    Frequency of Communication with Friends and Family: Twice a week    Frequency of Social Gatherings with Friends and Family: Once a week    Attends Religious Services:  Never    Database administrator or Organizations: No    Attends Banker Meetings: Never    Marital Status: Widowed    Tobacco Counseling Counseling given: Not Answered    Clinical Intake:  Pre-visit preparation completed: Yes  Pain : 0-10 Pain Score: 5  Pain Type: Acute pain Pain Location: Back Pain Orientation: Lower Pain Descriptors / Indicators: Aching Pain Onset: Today Pain Frequency: Occasional     BMI - recorded: 28.8 Nutritional Risks: Other (Comment) Diabetes: No  Lab Results  Component Value Date   HGBA1C 5.5 07/01/2021   HGBA1C 5.4 05/02/2020   HGBA1C 5.3 06/15/2019     How often do you need to have someone help you when you read instructions, pamphlets, or other written materials from your doctor or pharmacy?: 1 - Never  Interpreter Needed?: No  Information entered by :: Lyle Anderson LATHER   Activities of Daily Living     02/04/2024    9:10 AM 06/14/2023    8:07 AM  In your present state of health, do you have any difficulty performing the following activities:  Hearing? 0 0  Vision? 0 0  Difficulty concentrating or making decisions? 0 0  Walking or climbing stairs? 0 0  Dressing or bathing? 0 0  Doing errands, shopping? 0 0  Preparing Food and eating ? N   Using the Toilet? N   In the past six months, have you accidently leaked urine? Y   Do you have problems with loss of bowel control? N   Managing your Medications? N   Managing your Finances? N   Housekeeping or managing your Housekeeping? N     Patient Care Team: Sowles, Krichna, MD as PCP - General (Family Medicine) [name], MD as Consulting Physician (Urology) [name], MD as Consulting Physician (Urology) Lenn Aran, MD as Referring Physician (Radiation Oncology) Florencio Cara BIRCH, MD as Consulting Physician (Cardiology) Jacobo Evalene PARAS, MD as Consulting Physician (Oncology)  I have updated your Care Teams any recent Medical Services you  may have received from other providers in the past year.     Assessment:   This is a routine wellness examination for [place].  Hearing/Vision screen Hearing Screening - Comments:: No difficulties  Vision Screening - Comments:: Patient has otc reader   Goals Addressed             This Visit's Progress    Patient Stated       Patient wants to not get sick       Depression Screen     02/04/2024    9:11 AM 10/15/2023    8:14 AM 07/20/2023   11:17 AM 06/14/2023    8:07 AM 04/13/2023    8:12 AM 02/08/2023   11:08 AM 12/10/2022    8:56 AM  PHQ 2/9 Scores  PHQ - 2 Score 0 0 0 0 0 0 0  PHQ- 9 Score 2 0 0 0 0 0     Fall Risk     02/04/2024    9:10 AM 10/15/2023    8:14 AM 07/20/2023   11:17 AM 06/14/2023    8:05 AM 04/13/2023    8:12 AM  Fall Risk   Falls in the past year? 0 0 0 0 1  Number falls in past yr: 0 0 0 0 0  Injury with Fall? 0 0 0 0 0  Risk for fall due to : No Fall Risks No Fall Risks No Fall Risks  Impaired balance/gait  Follow up Falls evaluation completed Falls prevention discussed;Education provided;Falls evaluation completed Falls prevention discussed;Education provided;Falls evaluation completed  Falls evaluation completed;Education provided;Falls prevention discussed    MEDICARE RISK AT HOME:  Medicare Risk at Home Any stairs in or around the home?: No If so, are there any without handrails?: No Home free of loose throw rugs in walkways, pet beds, electrical cords, etc?: Yes Adequate lighting in your home to reduce risk of falls?: Yes Life alert?: No Use of a cane, walker or w/c?: No Grab bars in the bathroom?: No Shower chair or bench in shower?: No Elevated toilet seat or a handicapped toilet?: No  TIMED UP AND GO:  Was the test performed?  No  Cognitive Function: 6CIT completed        02/04/2024    9:09 AM 12/10/2022    9:06 AM 03/03/2022   11:03 AM 02/27/2020   10:44 AM 02/21/2019   11:13 AM  6CIT Screen  What Year? 0 points 0 points 0 points 0  points 0 points  What month? 0 points 0 points 0 points 0 points 0 points  What time? 0 points 0 points 0 points 0 points 0 points  Count back from 20 0 points 0 points 0 points 0 points 0 points  Months in reverse 0 points 0 points 0 points 0 points 0 points  Repeat phrase 0 points 0 points 0 points 2 points 2 points  Total Score 0 points 0 points 0 points 2 points 2 points    Immunizations Immunization History  Administered Date(s) Administered   Fluad Quad(high Dose 65+) 05/02/2019, 05/02/2020   Fluad Trivalent(High Dose 65+) 04/13/2023   Influenza, High Dose Seasonal PF 05/21/2016, 05/07/2017, 04/27/2018   Influenza-Unspecified 04/12/2014, 04/27/2015, 05/09/2021, 05/11/2022   Moderna SARS-COV2 Booster Vaccination 10/24/2020, 05/11/2022   Moderna Sars-Covid-2 Vaccination 08/23/2019, 09/20/2019, 05/08/2020, 05/09/2021   Pneumococcal Conjugate-13 02/05/2017   Pneumococcal Polysaccharide-23 03/08/2012, 02/11/2018   Tdap 04/03/2010, 04/06/2023   Zoster, Live 02/05/2016    Screening Tests Health Maintenance  Topic Date Due   Zoster Vaccines- Shingrix  (1 of 2) 04/21/1970   COVID-19 Vaccine (6 - Moderna risk 2024-25 season) 11/10/2023   INFLUENZA VACCINE  02/11/2024   Lung Cancer Screening  08/16/2024  Colonoscopy  01/04/2025   Medicare Annual Wellness (AWV)  02/03/2025   DTaP/Tdap/Td (3 - Td or Tdap) 04/05/2033   Pneumococcal Vaccine: 50+ Years  Completed   Hepatitis C Screening  Completed   Hepatitis B Vaccines  Aged Out   HPV VACCINES  Aged Out   Meningococcal B Vaccine  Aged Out    Health Maintenance  Health Maintenance Due  Topic Date Due   Zoster Vaccines- Shingrix  (1 of 2) 04/21/1970   COVID-19 Vaccine (6 - Moderna risk 2024-25 season) 11/10/2023   Health Maintenance Items Addressed:   Additional Screening:  Vision Screening: Recommended annual ophthalmology exams for early detection of glaucoma and other disorders of the eye. Would you like a referral to an  eye doctor? No    Dental Screening: Recommended annual dental exams for proper oral hygiene  Community Resource Referral / Chronic Care Management: CRR required this visit?  No   CCM required this visit?  No   Plan:    I have personally reviewed and noted the following in the patient's chart:   Medical and social history Use of alcohol, tobacco or illicit drugs  Current medications and supplements including opioid prescriptions. Patient is not currently taking opioid prescriptions. Functional ability and status Nutritional status Physical activity Advanced directives List of other physicians Hospitalizations, surgeries, and ER visits in previous 12 months Vitals Screenings to include cognitive, depression, and falls Referrals and appointments  In addition, I have reviewed and discussed with patient certain preventive protocols, quality metrics, and best practice recommendations. A written personalized care plan for preventive services as well as general preventive health recommendations were provided to patient.   Lyle MARLA Right, [place]   02/04/2024   After Visit Summary: (MyChart) Due to this being a telephonic visit, the after visit summary with patients personalized plan was offered to patient via MyChart   Notes: Nothing significant to report at this time.

## 2024-02-04 NOTE — Patient Instructions (Signed)
 Ernest Sweeney , Thank you for taking time out of your busy schedule to complete your Annual Wellness Visit with me. I enjoyed our conversation and look forward to speaking with you again next year. I, as well as your care team,  appreciate your ongoing commitment to your health goals. Please review the following plan we discussed and let me know if I can assist you in the future. Your Game plan/ To Do List    Referrals: If you haven't heard from the office you've been referred to, please reach out to them at the phone provided.   Follow up Visits: Next Medicare AWV with our clinical staff: 02/09/2025   Have you seen your provider in the last 6 months (3 months if uncontrolled diabetes)? No Next Office Visit with your provider: 04/20/2024  Clinician Recommendations:  Aim for 30 minutes of exercise or brisk walking, 6-8 glasses of water, and 5 servings of fruits and vegetables each day.       This is a list of the screening recommended for you and due dates:  Health Maintenance  Topic Date Due   Zoster (Shingles) Vaccine (1 of 2) 04/21/1970   COVID-19 Vaccine (6 - Moderna risk 2024-25 season) 11/10/2023   Flu Shot  02/11/2024   Screening for Lung Cancer  08/16/2024   Colon Cancer Screening  01/04/2025   Medicare Annual Wellness Visit  02/03/2025   DTaP/Tdap/Td vaccine (3 - Td or Tdap) 04/05/2033   Pneumococcal Vaccine for age over 21  Completed   Hepatitis C Screening  Completed   Hepatitis B Vaccine  Aged Out   HPV Vaccine  Aged Out   Meningitis B Vaccine  Aged Out    Advanced directives: (Declined) Advance directive discussed with you today. Even though you declined this today, please call our office should you change your mind, and we can give you the proper paperwork for you to fill out. Advance Care Planning is important because it:  [x]  Makes sure you receive the medical care that is consistent with your values, goals, and preferences  [x]  It provides guidance to your family and  loved ones and reduces their decisional burden about whether or not they are making the right decisions based on your wishes.  Follow the link provided in your after visit summary or read over the paperwork we have mailed to you to help you started getting your Advance Directives in place. If you need assistance in completing these, please reach out to us  so that we can help you!  See attachments for Preventive Care and Fall Prevention Tips.

## 2024-02-16 ENCOUNTER — Ambulatory Visit

## 2024-02-16 DIAGNOSIS — E538 Deficiency of other specified B group vitamins: Secondary | ICD-10-CM

## 2024-02-16 MED ORDER — CYANOCOBALAMIN 1000 MCG/ML IJ SOLN
1000.0000 ug | Freq: Once | INTRAMUSCULAR | Status: AC
  Administered 2024-02-16: 1000 ug via INTRAMUSCULAR

## 2024-02-16 NOTE — Progress Notes (Signed)
 Patient is in office today for a nurse visit for B12 Injection. Patient Injection was given in the  Left deltoid. Patient tolerated injection well.

## 2024-02-18 DIAGNOSIS — H2512 Age-related nuclear cataract, left eye: Secondary | ICD-10-CM | POA: Diagnosis not present

## 2024-02-18 DIAGNOSIS — H401132 Primary open-angle glaucoma, bilateral, moderate stage: Secondary | ICD-10-CM | POA: Diagnosis not present

## 2024-02-22 ENCOUNTER — Encounter: Payer: Self-pay | Admitting: Emergency Medicine

## 2024-02-22 ENCOUNTER — Other Ambulatory Visit: Payer: Self-pay

## 2024-02-22 ENCOUNTER — Emergency Department

## 2024-02-22 ENCOUNTER — Emergency Department
Admission: EM | Admit: 2024-02-22 | Discharge: 2024-02-22 | Disposition: A | Discharge status: Home / Self Care | Attending: Emergency Medicine | Admitting: Emergency Medicine

## 2024-02-22 DIAGNOSIS — K5730 Diverticulosis of large intestine without perforation or abscess without bleeding: Secondary | ICD-10-CM | POA: Diagnosis not present

## 2024-02-22 DIAGNOSIS — K4090 Unilateral inguinal hernia, without obstruction or gangrene, not specified as recurrent: Secondary | ICD-10-CM | POA: Diagnosis not present

## 2024-02-22 DIAGNOSIS — K449 Diaphragmatic hernia without obstruction or gangrene: Secondary | ICD-10-CM | POA: Diagnosis not present

## 2024-02-22 DIAGNOSIS — I2510 Atherosclerotic heart disease of native coronary artery without angina pectoris: Secondary | ICD-10-CM | POA: Insufficient documentation

## 2024-02-22 DIAGNOSIS — I10 Essential (primary) hypertension: Secondary | ICD-10-CM | POA: Insufficient documentation

## 2024-02-22 DIAGNOSIS — R109 Unspecified abdominal pain: Secondary | ICD-10-CM | POA: Insufficient documentation

## 2024-02-22 DIAGNOSIS — M545 Low back pain, unspecified: Secondary | ICD-10-CM | POA: Insufficient documentation

## 2024-02-22 DIAGNOSIS — M5459 Other low back pain: Secondary | ICD-10-CM | POA: Diagnosis not present

## 2024-02-22 LAB — URINALYSIS, ROUTINE W REFLEX MICROSCOPIC
Bilirubin Urine: NEGATIVE
Glucose, UA: NEGATIVE mg/dL
Ketones, ur: NEGATIVE mg/dL
Leukocytes,Ua: NEGATIVE
Nitrite: NEGATIVE
Protein, ur: 30 mg/dL — AB
Specific Gravity, Urine: 1.012 (ref 1.005–1.030)
pH: 7 (ref 5.0–8.0)

## 2024-02-22 MED ORDER — TRAMADOL HCL 50 MG PO TABS: 50.0000 mg | ORAL_TABLET | Freq: Four times a day (QID) | ORAL | 0 refills | Status: DC | PRN

## 2024-02-22 MED ORDER — ACETAMINOPHEN 500 MG PO TABS
1000.0000 mg | ORAL_TABLET | Freq: Once | ORAL | Status: AC
Start: 2024-02-22 — End: 2024-02-22
  Administered 2024-02-22 (×2): 1000 mg via ORAL
  Filled 2024-02-22: qty 2

## 2024-02-22 MED ORDER — PREDNISONE 10 MG (21) PO TBPK: ORAL_TABLET | ORAL | 0 refills | Status: DC

## 2024-02-22 NOTE — ED Provider Notes (Signed)
 [facility] Provider Note    Event Date/Time   First MD Initiated Contact with Patient 02/22/24 1138     (approximate)   History   Back Pain   HPI  Ernest Sweeney is a 73 y.o. male with history of CAD, HTN, alcoholism, and as listed in EMR presents to the emergency department for treatment and evaluation of right lower back pain that started this morning when he stood up.  No urinary symptoms.  No nausea or vomiting. No back pain. Pain sometimes travels down the right leg, but not always.  No alleviating measures attempted prior to arrival.     Physical Exam    Vitals:   02/22/24 1140 02/22/24 1507  BP:  (!) 138/90  Pulse:  86  Resp:  18  Temp:    SpO2: 98% 100%    General: Awake, no distress.  CV:  Good peripheral perfusion.  Resp:  Normal effort.  Abd:  No distention.  Other:  No CVA tenderness   ED Results / Procedures / Treatments   Labs (all labs ordered are listed, but only abnormal results are displayed)  Labs Reviewed  URINALYSIS, ROUTINE W REFLEX MICROSCOPIC - Abnormal; Notable for the following components:      Result Value   Color, Urine YELLOW (*)    APPearance CLEAR (*)    Hgb urine dipstick SMALL (*)    Protein, ur 30 (*)    Bacteria, UA RARE (*)    All other components within normal limits     EKG   RADIOLOGY  Image and radiology report reviewed and interpreted by me. Radiology report consistent with the same.  CT negative for obstructive stone.  Incidental findings of left ureterocele versus urinary bladder diverticula with limited evaluation on noncontrast study.  Nonobstructive punctate left nephrolithiasis.  Chronic diverticulosis with no acute diverticulitis.  Moderate to large volume partially visualized left inguinal hernia containing a loop of small bowel.  Aortic atherosclerosis.  PROCEDURES:  Critical Care performed: No  Procedures   MEDICATIONS ORDERED IN ED:  Medications  acetaminophen   (TYLENOL ) tablet 1,000 mg (1,000 mg Oral Given 02/22/24 1324)     IMPRESSION / MDM / ASSESSMENT AND PLAN / ED COURSE   I have reviewed the triage note and vital signs. Vital signs stable.   Differential diagnosis includes, but is not limited to, lumbar strain, osteoarthritis, sciatica, ureterolithiasis, acute cystitis  Patient's presentation is most consistent with acute illness / injury with system symptoms.  74 year old male presenting to the emergency department for treatment and evaluation of acute onset right sided back pain that started this morning.  See HPI for further details.  On exam, straight leg raises negative.  No CVA tenderness.  Plan will be to get a urinalysis.  If there is any indication of blood, plan will be to get a CT renal stone study.  Clinical Course as of 02/22/24 1843  Tue Feb 22, 2024  1303 Urine shows a small amount of hemoglobin.  Due to flank pain that started acutely, CT renal stone study ordered. [CT]    Clinical Course User Index [CT] Ellieanna Funderburg B, FNP   CT negative for obstructive stone or other findings consistent with rationale for patient's sudden pain.  Plan will be to treat him symptomatically and have him follow-up with his primary care provider.  FINAL CLINICAL IMPRESSION(S) / ED DIAGNOSES   Final diagnoses:  Acute right-sided low back pain, unspecified whether sciatica present  Rx / DC Orders   ED Discharge Orders          Ordered    predniSONE  (STERAPRED UNI-PAK 21 TAB) 10 MG (21) TBPK tablet        02/22/24 1456    traMADol  (ULTRAM ) 50 MG tablet  Every 6 hours PRN        02/22/24 1456             Note:  This document was prepared using Dragon voice recognition software and may include unintentional dictation errors.   Herlinda Kirk NOVAK, FNP 02/22/24 1843    Willo Dunnings, MD 02/22/24 [phone]

## 2024-02-22 NOTE — ED Notes (Signed)
 Pt instructed to give a urine sample

## 2024-02-22 NOTE — ED Triage Notes (Signed)
 Pt reports right lower back pain that started this morning. Denies urinary symptoms. Denies n/v. PT describes the pain as stabbing and feels like a spasm.

## 2024-02-22 NOTE — Discharge Instructions (Addendum)
 Your CT scan does not show any sign or concern for kidney stone.   Please be aware that the tramadol  prescribed today may cause you to get dizzy with position changes and may cause drowsiness.  You should not drive or use any machinery for at least 8 hours after last dose.  You may also take Tylenol  every 8 hours for pain.  Take the prednisone  as prescribed until finished.  Do not take any additional Aleve, ibuprofen , or other NSAIDs while on this medication.  Follow-up with your primary care provider if not improving over the week.  If symptoms change or worsen and you are unable to schedule appointment, please return to the emergency department.

## 2024-02-28 ENCOUNTER — Ambulatory Visit (INDEPENDENT_AMBULATORY_CARE_PROVIDER_SITE_OTHER): Admitting: Nurse Practitioner

## 2024-02-28 ENCOUNTER — Encounter: Payer: Self-pay | Admitting: Nurse Practitioner

## 2024-02-28 VITALS — BP 118/78 | HR 94 | Temp 97.8°F | Resp 18 | Ht 69.0 in | Wt 197.0 lb

## 2024-02-28 DIAGNOSIS — M545 Low back pain, unspecified: Secondary | ICD-10-CM | POA: Diagnosis not present

## 2024-02-28 MED ORDER — TIZANIDINE HCL 4 MG PO TABS: 2.0000 mg | ORAL_TABLET | Freq: Three times a day (TID) | ORAL | 0 refills | Status: AC | PRN

## 2024-02-28 NOTE — Progress Notes (Signed)
 BP 118/78   Pulse 94   Temp 97.8 F (36.6 C)   Resp 18   Ht 5' 9 (1.753 m)   Wt 197 lb (89.4 kg)   SpO2 97%   BMI 29.09 kg/m    Subjective:    Patient ID: Ernest Sweeney, male    DOB: 08-30-1950, 73 y.o.   MRN: [id number]  HPI: Ernest Sweeney is a 73 y.o. male  Chief Complaint  Patient presents with   Back Pain    Right side back and middle, went to ER given prednisone  and Tramadol     Discussed the use of AI scribe software for clinical note transcription with the patient, who gave verbal consent to proceed.  History of Present Illness Ernest Sweeney is a 73 year old male who presents for follow-up after an ER visit for right lower back pain.  Right lower back pain and radiculopathy - Onset February 22, 2024, beginning in the morning upon standing - Pain localized to right lower back, sometimes radiating down the right leg and to the hip area - Pain described as feeling like 'daggers' when moving, with persistent soreness across the back - History of chronic back issues and prior use of muscle relaxers (tizanidine ) - Pain affects ability to move comfortably but able to perform necessary activities - No associated urinary symptoms, nausea, or vomiting  Imaging and laboratory findings - CT scan in ER revealed possible urinary bladder diverticula or ureterocele, kidney stones, diverticulosis, hernia, and aortic atherosclerosis - Urinalysis showed small blood and protein  Pain management and medication side effects - Currently taking tramadol  for pain; previously prescribed a steroid taper (not currently taking prednisone ) - Constipation as a side effect of tramadol   Urologic follow-up - Established care with a urologist, seen regularly - Routine urology appointment scheduled in one year    EXAM: CT ABDOMEN AND PELVIS WITHOUT CONTRAST   TECHNIQUE: Multidetector CT imaging of the abdomen and pelvis was performed following the standard protocol without IV  contrast.   RADIATION DOSE REDUCTION: This exam was performed according to the departmental dose-optimization program which includes automated exposure control, adjustment of the mA and/or kV according to patient size and/or use of iterative reconstruction technique.   COMPARISON:  CT abdomen pelvis 12/16/2007   FINDINGS: Lower chest: Bibasilar atelectasis.  Small hiatal hernia.   Hepatobiliary: No focal liver abnormality is seen. No gallstones, gallbladder wall thickening, or biliary dilatation.   Pancreas: No focal lesion. Normal pancreatic contour. No surrounding inflammatory changes. No main pancreatic ductal dilatation.   Spleen: Normal in size without focal abnormality.   Adrenals/Urinary Tract:   No adrenal nodule bilaterally.   Punctate left nephrolithiasis. No right nephrolithiasis. No hydronephrosis. Fluid density lesions of bilateral kidneys likely represent simple renal cysts. No ureterolithiasis or hydroureter.   Question left ureterocele versus urinary bladder diverticula with limited evaluation on this noncontrast study (5:63). Otherwise the urinary bladder is unremarkable.   Stomach/Bowel: Stomach is within normal limits. No evidence of bowel wall thickening or dilatation. Colonic diverticulosis. Appendix appears normal.   Vascular/Lymphatic: No abdominal aorta or iliac aneurysm. Moderate to severe atherosclerotic plaque of the aorta and its branches. No abdominal, pelvic, or inguinal lymphadenopathy.   Reproductive: The prostate is not visualized and may be surgically removed.   Other: No intraperitoneal free fluid. No intraperitoneal free gas. No organized fluid collection.   Musculoskeletal:   Moderate to large volume partially visualized left inguinal hernia containing a loop of small bowel.  No suspicious lytic or blastic osseous lesions. No acute displaced fracture. Multilevel degenerative changes of the spine.   IMPRESSION: 1. Question  left ureterocele versus urinary bladder diverticula with limited evaluation on this noncontrast study. 2. Nonobstructive punctate left nephrolithiasis. 3. Colonic diverticulosis with no acute diverticulitis. 4. Moderate to large volume partially visualized left inguinal hernia containing a loop of small bowel. 5.  Aortic Atherosclerosis (ICD10-I70.0).     02/04/2024    9:11 AM 10/15/2023    8:14 AM 07/20/2023   11:17 AM  Depression screen PHQ 2/9  Decreased Interest 0 0 0  Down, Depressed, Hopeless 0 0 0  PHQ - 2 Score 0 0 0  Altered sleeping 2 0 0  Tired, decreased energy 0 0 0  Change in appetite 0 0 0  Feeling bad or failure about yourself  0 0 0  Trouble concentrating 0 0 0  Moving slowly or fidgety/restless 0 0 0  Suicidal thoughts 0 0 0  PHQ-9 Score 2 0 0  Difficult doing work/chores Not difficult at all Not difficult at all Not difficult at all    Relevant past medical, surgical, family and social history reviewed and updated as indicated. Interim medical history since our last visit reviewed. Allergies and medications reviewed and updated.  Review of Systems Ten systems reviewed and is negative except as mentioned in HPI      Objective:     BP 118/78   Pulse 94   Temp 97.8 F (36.6 C)   Resp 18   Ht 5' 9 (1.753 m)   Wt 197 lb (89.4 kg)   SpO2 97%   BMI 29.09 kg/m    Wt Readings from Last 3 Encounters:  02/28/24 197 lb (89.4 kg)  02/04/24 195 lb (88.5 kg)  12/15/23 194 lb (88 kg)    Physical Exam Physical Exam GENERAL: Alert, cooperative, well developed, no acute distress HEENT: Normocephalic, normal oropharynx, moist mucous membranes CHEST: Clear to auscultation bilaterally, no wheezes, rhonchi, or crackles CARDIOVASCULAR: Normal heart rate and rhythm, S1 and S2 normal without murmurs ABDOMEN: Soft, non-tender, non-distended, without organomegaly, normal bowel sounds EXTREMITIES: No cyanosis or edema MUSCULOSKELETAL: Straight leg raises negative, no  costovertebral angle tenderness NEUROLOGICAL: Cranial nerves grossly intact, moves all extremities without gross motor or sensory deficit   Results for orders placed or performed during the hospital encounter of 02/22/24  Urinalysis, Routine w reflex microscopic -Urine, Clean Catch   Collection Time: 02/22/24 12:20 PM  Result Value Ref Range   Color, Urine YELLOW (A) YELLOW   APPearance CLEAR (A) CLEAR   Specific Gravity, Urine 1.012 1.005 - 1.030   pH 7.0 5.0 - 8.0   Glucose, UA NEGATIVE NEGATIVE mg/dL   Hgb urine dipstick SMALL (A) NEGATIVE   Bilirubin Urine NEGATIVE NEGATIVE   Ketones, ur NEGATIVE NEGATIVE mg/dL   Protein, ur 30 (A) NEGATIVE mg/dL   Nitrite NEGATIVE NEGATIVE   Leukocytes,Ua NEGATIVE NEGATIVE   RBC / HPF 0-5 0 - 5 RBC/hpf   WBC, UA 0-5 0 - 5 WBC/hpf   Bacteria, UA RARE (A) NONE SEEN   Squamous Epithelial / HPF 0-5 0 - 5 /HPF   Mucus PRESENT           Assessment & Plan:   Problem List Items Addressed This Visit   None Visit Diagnoses       Acute right-sided low back pain without sciatica    -  Primary   Relevant Medications   tiZANidine  (ZANAFLEX ) 4 MG  tablet        Assessment and Plan Assessment & Plan Right lower back pain Chronic right lower back pain with radiation to the right leg, exacerbated by movement and described as feeling like daggers. Patient reports previous numbness or tingling, but currently feeling it. Pain persists despite tramadol  and prednisone . Negative straight leg raises and no CVA tenderness in ER. - Prescribe tizanidine  as a muscle relaxant - Advise use of heat compresses for no more than 20 minutes - Encourage physical activity without heavy lifting - Provide printout of stretches - Consider physical therapy if pain does not improve  Left urinary bladder diverticulum versus ureterocele (needs further evaluation) CT scan showed left urinary bladder diverticulum versus ureterocele, requiring further evaluation to prevent  potential complications. - Advise follow-up with urologist for further evaluation of CT findings  Left nephrolithiasis CT scan revealed left nephrolithiasis. Stones are not in a location to cause immediate problems. No symptoms indicating obstruction or infection.  Left inguinal hernia CT scan showed moderate to large volume left inguinal hernia containing a loop of small bowel. No acute symptoms reported.  Diverticulosis of colon CT scan indicated chronic diverticulosis without signs of acute diverticulitis. - Advise avoiding foods with seeds to prevent diverticulitis - Instruct to report any abdominal pain        Follow up plan: Return if symptoms worsen or fail to improve.

## 2024-03-01 NOTE — Progress Notes (Signed)
 03/03/2024 9:58 PM   Carlin LELON Gentry 27-Apr-1951 [id number]  Referring provider: Glenard Mire, MD [name] [place],  [place] [address]  Urological history: 1.  Prostate cancer - PSA (11/2023) <0.01 - robotic prostatectomy and bilateral lymphadenectomy (2018) - Final pathology showed Gleason score 3+4=7 prostate cancer stage pT3aN0 Mx, there was focal extraprostatic extension and perineural invasion, margins were negative  - biochemical occurrence treated with salvage radiation (2019)  - 6 months ADT completed 08/20219  2. ED - sildenafil  100 mg, on-demand-dosing  3. SUI  4.  Nephrolithiasis - Punctate left stone seen on CT renal stone study dated February 22, 2024  Chief Complaint  Patient presents with   Follow-up   HPI: EMERIC NOVINGER is a 73 y.o. man who presents today for further evaluation for a possible left urinary bladder diverticulum versus a left ureterocele seen on recent CT.  Previous records reviewed.   He was seen in the ED on 02/22/2024 for right sciatic.  A CT renal stone study found findings questionable for left ureterocele versus urinary bladder diverticulum.  UA in the ED was bland.    He has not had a history of recurrent UTI's.  Patient denies any modifying or aggravating factors.  Patient denies any recent UTI's, gross hematuria, dysuria or suprapubic/flank pain.  Patient denies any fevers, chills, nausea or vomiting.     Lab Results  Component Value Date   WBC 3.2 (L) 12/08/2023   HGB 16.2 12/08/2023   HCT 47.3 12/08/2023   MCV 94.6 12/08/2023   PLT 321 12/08/2023    Lab Results  Component Value Date   CREATININE 1.37 (H) 12/08/2023    PMH: Past Medical History:  Diagnosis Date   Anxiety    Panic attacks in the past   Arthritis    Benign prostatic hypertrophy without lower urinary tract symptoms    Cancer (HCC)    Prostate. monitoring now since surgery   Colon polyp    GERD (gastroesophageal reflux disease)     Glaucoma (increased eye pressure)    Left eye only   Hyperlipidemia    Hypertension     Surgical History: Past Surgical History:  Procedure Laterality Date   COLONOSCOPY  07/13/2008   Dr Philip   COLONOSCOPY WITH PROPOFOL  N/A 01/05/2020   Procedure: COLONOSCOPY WITH PROPOFOL ;  Surgeon: Therisa Bi, MD;  Location[facility] ENDOSCOPY;  Service: Gastroenterology;  Laterality: N/A;   excision skin cyst  06/19/2014   Dr. Heloise CYST   INGUINAL HERNIA REPAIR Right 09/06/2020   Procedure: HERNIA REPAIR RIGH  INGUINAL WITH MESH ADULT, open with RNFA to assist;  Surgeon: Marolyn Nest, MD;  Location: [facility] ORS;  Service: General;  Laterality: Right;  Provider requesting 2 hours/120 minutes for procedure.   Neuroendoscopy  05/28/2022   With excision pituitary tumor transnasal or transphenoidal approach. Resection of craniopharyngioma.   PELVIC LYMPH NODE DISSECTION N/A 07/15/2016   Procedure: PELVIC LYMPH NODE DISSECTION;  Surgeon: Redell Lynwood Napoleon, MD;  Location: [facility] ORS;  Service: Urology;  Laterality: N/A;   ROBOT ASSISTED LAPAROSCOPIC RADICAL PROSTATECTOMY N/A 07/15/2016   Procedure: ROBOTIC ASSISTED LAPAROSCOPIC RADICAL PROSTATECTOMY;  Surgeon: Redell Lynwood Napoleon, MD;  Location: [facility] ORS;  Service: Urology;  Laterality: N/A;    Home Medications:  Allergies as of 03/03/2024   No Known Allergies      Medication List        Accurate as of March 03, 2024 11:59 PM. If you have any questions, ask  your nurse or doctor.          amLODipine  5 MG tablet Commonly known as: NORVASC  Take 1 tablet (5 mg total) by mouth daily.   aspirin 81 MG tablet Take 81 mg by mouth daily.   Combigan 0.2-0.5 % ophthalmic solution Generic drug: brimonidine-timolol Place 1 drop into both eyes every 12 (twelve) hours.   fluticasone  50 MCG/ACT nasal spray Commonly known as: FLONASE  Place 2 sprays into both nostrils daily.   latanoprost  0.005 % ophthalmic solution Commonly known as:  XALATAN  Place 1 drop into both eyes at bedtime.   nortriptyline 10 MG capsule Commonly known as: PAMELOR Take 20 mg by mouth at bedtime.   predniSONE  10 MG (21) Tbpk tablet Commonly known as: STERAPRED UNI-PAK 21 TAB Take 6 tablets on the first day and decrease by 1 tablet each day until finished.   rosuvastatin  40 MG tablet Commonly known as: CRESTOR  TAKE 1 TABLET BY MOUTH ONCE DAILY IN  PLACE  OF  ATORVASTATIN    sildenafil  100 MG tablet Commonly known as: VIAGRA  Take 1 tablet (100 mg total) by mouth daily as needed for erectile dysfunction.   tiZANidine  4 MG tablet Commonly known as: Zanaflex  Take 0.5-1.5 tablets (2-6 mg total) by mouth every 8 (eight) hours as needed for muscle spasms (muscle tightness).   triamcinolone  cream 0.1 % Commonly known as: KENALOG  APPLY TOPICALLY AS DIRECTED TWICE DAILY   Vitamin D  50 MCG (2000 UT) Caps Take 1 capsule (2,000 Units total) by mouth daily.        Allergies: No Known Allergies  Family History: Family History  Problem Relation Age of Onset   Heart disease Mother    Heart attack Mother    Diabetes Father    Heart attack Father    Dementia Father    Cancer Sister        Breast   Cancer Brother        Prostate   Healthy Sister    Stroke Brother     Social History: See HPI for pertinent social history  ROS: Pertinent ROS in HPI  Physical Exam: BP (!) 133/98 (BP Location: Left Arm, Patient Position: Sitting, Cuff Size: Normal)   Pulse 95   Ht 5' 9 (1.753 m)   Wt 197 lb (89.4 kg)   SpO2 98%   BMI 29.09 kg/m   Constitutional:  Well nourished. Alert and oriented, No acute distress. HEENT: [place] AT, moist mucus membranes.  Trachea midline Cardiovascular: No clubbing, cyanosis, or edema. Respiratory: Normal respiratory effort, no increased work of breathing. Neurologic: Grossly intact, no focal deficits, moving all 4 extremities. Psychiatric: Normal mood and affect.  Laboratory Data: See EPIC and HPI  I have  reviewed the labs.   Pertinent Imaging: CLINICAL DATA:  Abdominal/flank pain, stone suspected Pt reports right lower back pain that started this morning. Denies urinary symptoms. Denies n/v. PT describes the pain as stabbing and feels like a spasm.   EXAM: CT ABDOMEN AND PELVIS WITHOUT CONTRAST   TECHNIQUE: Multidetector CT imaging of the abdomen and pelvis was performed following the standard protocol without IV contrast.   RADIATION DOSE REDUCTION: This exam was performed according to the departmental dose-optimization program which includes automated exposure control, adjustment of the mA and/or kV according to patient size and/or use of iterative reconstruction technique.   COMPARISON:  CT abdomen pelvis 12/16/2007   FINDINGS: Lower chest: Bibasilar atelectasis.  Small hiatal hernia.   Hepatobiliary: No focal liver abnormality is seen. No gallstones,  gallbladder wall thickening, or biliary dilatation.   Pancreas: No focal lesion. Normal pancreatic contour. No surrounding inflammatory changes. No main pancreatic ductal dilatation.   Spleen: Normal in size without focal abnormality.   Adrenals/Urinary Tract:   No adrenal nodule bilaterally.   Punctate left nephrolithiasis. No right nephrolithiasis. No hydronephrosis. Fluid density lesions of bilateral kidneys likely represent simple renal cysts. No ureterolithiasis or hydroureter.   Question left ureterocele versus urinary bladder diverticula with limited evaluation on this noncontrast study (5:63). Otherwise the urinary bladder is unremarkable.   Stomach/Bowel: Stomach is within normal limits. No evidence of bowel wall thickening or dilatation. Colonic diverticulosis. Appendix appears normal.   Vascular/Lymphatic: No abdominal aorta or iliac aneurysm. Moderate to severe atherosclerotic plaque of the aorta and its branches. No abdominal, pelvic, or inguinal lymphadenopathy.   Reproductive: The prostate is not  visualized and may be surgically removed.   Other: No intraperitoneal free fluid. No intraperitoneal free gas. No organized fluid collection.   Musculoskeletal:   Moderate to large volume partially visualized left inguinal hernia containing a loop of small bowel.   No suspicious lytic or blastic osseous lesions. No acute displaced fracture. Multilevel degenerative changes of the spine.   IMPRESSION: 1. Question left ureterocele versus urinary bladder diverticula with limited evaluation on this noncontrast study. 2. Nonobstructive punctate left nephrolithiasis. 3. Colonic diverticulosis with no acute diverticulitis. 4. Moderate to large volume partially visualized left inguinal hernia containing a loop of small bowel. 5.  Aortic Atherosclerosis (ICD10-I70.0).     Electronically Signed   By: Morgane  Naveau M.D.   On: 02/22/2024 14:44  I have independently reviewed the films.  See HPI.  Assessment & Plan:    1. Bladder diverticulum vs ureterocele - Provided a verbal explanation to the patient for a bladder diverticulum and ureterocele  - Explained that I would like to do a CT urogram for further evaluation of this area to get a clearer picture of what it might be and if any filling defects show up that would indicate a more ominous pathology  - he is agreeable and would like to schedule   2. Prostate cancer - May's PSA was undetectable - Has a follow-up with oncology in December - Has a follow-up with Dr. Francisca in March  3. ED - Continue sildenafil  100 mg on demand dosing  4. SUI - not bothersome   Return for I will call patient with results.  These notes generated with voice recognition software. I apologize for typographical errors.  CLOTILDA HELON RIGGERS  [facility] [address]  [address] [place], [place] [address] [phone]

## 2024-03-03 ENCOUNTER — Ambulatory Visit: Admitting: Urology

## 2024-03-03 VITALS — BP 133/98 | HR 95 | Ht 69.0 in | Wt 197.0 lb

## 2024-03-03 DIAGNOSIS — N323 Diverticulum of bladder: Secondary | ICD-10-CM | POA: Diagnosis not present

## 2024-03-03 DIAGNOSIS — N529 Male erectile dysfunction, unspecified: Secondary | ICD-10-CM

## 2024-03-03 DIAGNOSIS — N393 Stress incontinence (female) (male): Secondary | ICD-10-CM

## 2024-03-03 DIAGNOSIS — N2889 Other specified disorders of kidney and ureter: Secondary | ICD-10-CM

## 2024-03-03 DIAGNOSIS — C61 Malignant neoplasm of prostate: Secondary | ICD-10-CM

## 2024-03-03 NOTE — Patient Instructions (Signed)
 We have ordered CT urogram and the scheduling department should reach out to you to schedule this appointment.  Sometimes, they have difficulty reaching patients because they are calling from an unknown number and many people have their phones set to ignore unknown numbers.  They do try to leave messages, but sometimes voicemails are not set up or mailboxes are full.  If you have not heard from the scheduling department in a week, please call [phone] to schedule your CT urogram.

## 2024-03-05 ENCOUNTER — Encounter: Payer: Self-pay | Admitting: Urology

## 2024-03-09 ENCOUNTER — Encounter: Payer: Self-pay | Admitting: Urology

## 2024-03-09 DIAGNOSIS — I129 Hypertensive chronic kidney disease with stage 1 through stage 4 chronic kidney disease, or unspecified chronic kidney disease: Secondary | ICD-10-CM | POA: Diagnosis not present

## 2024-03-09 DIAGNOSIS — D472 Monoclonal gammopathy: Secondary | ICD-10-CM | POA: Diagnosis not present

## 2024-03-09 DIAGNOSIS — N1831 Chronic kidney disease, stage 3a: Secondary | ICD-10-CM | POA: Diagnosis not present

## 2024-03-09 DIAGNOSIS — H401132 Primary open-angle glaucoma, bilateral, moderate stage: Secondary | ICD-10-CM | POA: Diagnosis not present

## 2024-03-09 DIAGNOSIS — N189 Chronic kidney disease, unspecified: Secondary | ICD-10-CM | POA: Diagnosis not present

## 2024-03-09 DIAGNOSIS — R809 Proteinuria, unspecified: Secondary | ICD-10-CM | POA: Diagnosis not present

## 2024-03-09 DIAGNOSIS — H2513 Age-related nuclear cataract, bilateral: Secondary | ICD-10-CM | POA: Diagnosis not present

## 2024-03-14 ENCOUNTER — Ambulatory Visit
Admission: RE | Admit: 2024-03-14 | Discharge: 2024-03-14 | Disposition: A | Discharge status: Home / Self Care | Source: Ambulatory Visit | Attending: Urology | Admitting: Urology

## 2024-03-14 DIAGNOSIS — R935 Abnormal findings on diagnostic imaging of other abdominal regions, including retroperitoneum: Secondary | ICD-10-CM | POA: Diagnosis not present

## 2024-03-14 DIAGNOSIS — N2889 Other specified disorders of kidney and ureter: Secondary | ICD-10-CM | POA: Diagnosis not present

## 2024-03-14 DIAGNOSIS — N323 Diverticulum of bladder: Secondary | ICD-10-CM | POA: Diagnosis not present

## 2024-03-14 DIAGNOSIS — K4090 Unilateral inguinal hernia, without obstruction or gangrene, not specified as recurrent: Secondary | ICD-10-CM | POA: Diagnosis not present

## 2024-03-14 LAB — POCT I-STAT CREATININE: Creatinine, Ser: 1.5 mg/dL — ABNORMAL HIGH (ref 0.61–1.24)

## 2024-03-14 MED ORDER — IOHEXOL 300 MG/ML  SOLN
100.0000 mL | Freq: Once | INTRAMUSCULAR | Status: AC | PRN
  Administered 2024-03-14: 100 mL via INTRAVENOUS

## 2024-03-16 ENCOUNTER — Ambulatory Visit: Payer: Self-pay | Admitting: Urology

## 2024-03-22 ENCOUNTER — Ambulatory Visit: Payer: Self-pay

## 2024-03-22 ENCOUNTER — Other Ambulatory Visit: Payer: Self-pay | Admitting: Family Medicine

## 2024-03-22 DIAGNOSIS — R21 Rash and other nonspecific skin eruption: Secondary | ICD-10-CM

## 2024-03-22 MED ORDER — TRIAMCINOLONE ACETONIDE 0.1 % EX CREA: TOPICAL_CREAM | Freq: Two times a day (BID) | CUTANEOUS | 0 refills | Status: AC

## 2024-03-22 NOTE — Telephone Encounter (Signed)
 FYI Only or Action Required?: FYI only for provider.  Patient was last seen in primary care on 02/28/2024 by Gareth Mliss FALCON, FNP.  Called Nurse Triage reporting Rash.  Symptoms began several weeks ago.  Interventions attempted: OTC medications: 1% hydrocortisone cream.  Symptoms are: red, blistered severely itchy rash intermittent at antecubitals, wrists, legs, abdomen and back comes and goes.  Triage Disposition: See Within 3 Days in Office (overriding See Physician Within 24 Hours)  Patient/caregiver understands and will follow disposition?: Yes        Copied from CRM [id number]. Topic: Clinical - Medication Question >> Mar 22, 2024  1:06 PM Montie POUR wrote: Reason for CRM:  Calen wants to know if Dr. Glenard can call him in a pill to help with is rash. Rash below elbow, legs, back. Please call Ledger at [phone] to let him know what you can do. Thanks Reason for Disposition  SEVERE itching (i.e., interferes with sleep, normal activities or school)  Answer Assessment - Initial Assessment Questions He requested a refill of Kenalog  0.1% this afternoon, states he has run out of the medication for about a month and uses it for a chronic rash. He states his current rash symptoms are different than normal/chronic rash. Patient states he is unavailable tomorrow, agreeable to acute visit on Friday for rash. Patient has been using OTC 1% hydrocortisone cream and advised that he can take Benadryl at night for itching as well.  1. APPEARANCE of RASH: What does the rash look like? (e.g., blisters, dry flaky skin, red spots, redness, sores)     Sometimes like bumps with red spots in them. Sometimes looks like blisters. Currently he states he has some individual bumps, some are red.  2. SIZE: How big are the spots? (e.g., tip of pen, eraser, coin; inches, centimeters)     Pinpoint.  3. LOCATION: Where is the rash located?     Bilateral elbows, wrists, legs, back, abdomen.  4.  COLOR: What color is the rash? (Note: It is difficult to assess rash color in people with darker-colored skin. When this situation occurs, simply ask the caller to describe what they see.)     He states currently it is not flared up as bad; red.  5. ONSET: When did the rash begin?     X couple weeks.  6. FEVER: Do you have a fever? If Yes, ask: What is your temperature, how was it measured, and when did it start?     No.  7. ITCHING: Does the rash itch? If Yes, ask: How bad is the itch? (Scale 1-10; or mild, moderate, severe)     Yes, severe. He states he can't sleep sometimes.  8. CAUSE: What do you think is causing the rash?     See #9.  9. MEDICINE FACTORS: Have you started any new medicines within the last 2 weeks? (e.g., antibiotics)      Prednisone  and tizanidine . He also states he had a CT with contrast within the past 2 weeks and states he broke out in a rash with itchiness at the site of his IV and feels like his rash got worse since then.  10. OTHER SYMPTOMS: Do you have any other symptoms? (e.g., dizziness, headache, sore throat, joint pain)       Warmth in rash describes it as hot flashes and burning. Denies difficulty breathing, difficulty swallowing, facial or tongue swelling.  11. PREGNANCY: Is there any chance you are pregnant? When was your last menstrual period?  N/A.  Protocols used: Rash or Redness - [facility]

## 2024-03-22 NOTE — Telephone Encounter (Signed)
 Copied from CRM [id number]. Topic: Clinical - Medication Refill >> Mar 22, 2024  1:04 PM Montie POUR wrote: Medication: triamcinolone  cream (KENALOG ) 0.1 % - His rash has come back.   Has the patient contacted their pharmacy? Yes (Agent: If no, request that the patient contact the pharmacy for the refill. If patient does not wish to contact the pharmacy document the reason why and proceed with request.) (Agent: If yes, when and what did the pharmacy advise?)  This is the patient's preferred pharmacy:  [facility] [address] ([address]), [place] - [address] [place]) [place] [address] Phone: [phone] Fax: [phone]  Is this the correct pharmacy for this prescription? Yes If no, delete pharmacy and type the correct one.   Has the prescription been filled recently? No  Is the patient out of the medication? Yes  Has the patient been seen for an appointment in the last year OR does the patient have an upcoming appointment? Yes  Can we respond through MyChart? No  Agent: Please be advised that Rx refills may take up to 3 business days. We ask that you follow-up with your pharmacy.

## 2024-03-22 NOTE — Telephone Encounter (Signed)
 FYI Only or Action Required?: Action required by provider: medication refill request.  Patient was last seen in primary care on 02/28/2024 by Gareth Mliss FALCON, FNP.  Called Nurse Triage reporting No chief complaint on file..  Symptoms began today.  Interventions attempted: Nothing.  Symptoms are: stable.  Triage Disposition: No disposition on file.  Patient/caregiver understands and will follow disposition?:

## 2024-03-24 ENCOUNTER — Ambulatory Visit (INDEPENDENT_AMBULATORY_CARE_PROVIDER_SITE_OTHER): Admitting: Nurse Practitioner

## 2024-03-24 ENCOUNTER — Encounter: Payer: Self-pay | Admitting: Nurse Practitioner

## 2024-03-24 VITALS — BP 128/84 | HR 98 | Resp 16 | Ht 69.0 in | Wt 195.7 lb

## 2024-03-24 DIAGNOSIS — R21 Rash and other nonspecific skin eruption: Secondary | ICD-10-CM | POA: Diagnosis not present

## 2024-03-24 DIAGNOSIS — E538 Deficiency of other specified B group vitamins: Secondary | ICD-10-CM

## 2024-03-24 DIAGNOSIS — L299 Pruritus, unspecified: Secondary | ICD-10-CM | POA: Diagnosis not present

## 2024-03-24 MED ORDER — CYANOCOBALAMIN 1000 MCG/ML IJ SOLN
1000.0000 ug | Freq: Once | INTRAMUSCULAR | Status: AC
  Administered 2024-03-24: 1000 ug via INTRAMUSCULAR

## 2024-03-24 MED ORDER — HYDROXYZINE HCL 10 MG PO TABS: 10.0000 mg | ORAL_TABLET | Freq: Four times a day (QID) | ORAL | 0 refills | Status: DC | PRN

## 2024-03-24 NOTE — Progress Notes (Signed)
 BP 128/84   Pulse 98   Resp 16   Ht 5' 9 (1.753 m)   Wt 195 lb 11.2 oz (88.8 kg)   SpO2 98%   BMI 28.90 kg/m    Subjective:    Patient ID: Ernest Sweeney, male    DOB: Sep 09, 1950, 73 y.o.   MRN: [id number]  HPI: Ernest Sweeney is a 73 y.o. male presenting today for concerns for skin itching on his arms, abdomen, and back. Patient reports trying hydrocortisone with minimal relief. He states that he had a CT with contrast within the past 2 weeks and broke out in a rash at the IV site and feels like since then the itchiness has worsened. He reports not having any bumps or rash at this time just itchiness. Dr. Glenard had sent in Kenalog  cream for patient on 9/10 but he feels like he needs something stronger to help with the itchiness. Patient states the itchiness is worse at night and when going outside in the heat.       03/24/2024   10:59 AM 02/04/2024    9:11 AM 10/15/2023    8:14 AM  Depression screen PHQ 2/9  Decreased Interest 0 0 0  Down, Depressed, Hopeless 0 0 0  PHQ - 2 Score 0 0 0  Altered sleeping 0 2 0  Tired, decreased energy 0 0 0  Change in appetite 0 0 0  Feeling bad or failure about yourself  0 0 0  Trouble concentrating 0 0 0  Moving slowly or fidgety/restless 0 0 0  Suicidal thoughts 0 0 0  PHQ-9 Score 0 2 0  Difficult doing work/chores Not difficult at all Not difficult at all Not difficult at all    Relevant past medical, surgical, family and social history reviewed and updated as indicated. Interim medical history since our last visit reviewed. Allergies and medications reviewed and updated.  Review of Systems  Constitutional: Negative for fever or weight change.  Respiratory: Negative for cough and shortness of breath.   Cardiovascular: Negative for chest pain or palpitations.  Gastrointestinal: Negative for abdominal pain, no bowel changes.  Musculoskeletal: Negative for gait problem or joint swelling.  Skin: Positive for skin itchiness. Negative for  rash.  Neurological: Negative for dizziness or headache.  No other specific complaints in a complete review of systems (except as listed in HPI above).      Objective:     BP 128/84   Pulse 98   Resp 16   Ht 5' 9 (1.753 m)   Wt 195 lb 11.2 oz (88.8 kg)   SpO2 98%   BMI 28.90 kg/m    Wt Readings from Last 3 Encounters:  03/24/24 195 lb 11.2 oz (88.8 kg)  03/03/24 197 lb (89.4 kg)  02/28/24 197 lb (89.4 kg)    Physical Exam Constitutional:      Appearance: Normal appearance.  HENT:     Head: Normocephalic and atraumatic.  Cardiovascular:     Rate and Rhythm: Normal rate and regular rhythm.  Pulmonary:     Effort: Pulmonary effort is normal.     Breath sounds: Normal breath sounds.  Musculoskeletal:        General: Normal range of motion.     Cervical back: Normal range of motion and neck supple.  Skin:    General: Skin is warm and dry.     Findings: Erythema present. No rash.     Comments: Slight erythema to arms and lower back  Neurological:     Mental Status: He is alert.      Results for orders placed or performed during the hospital encounter of 03/14/24  I-STAT creatinine   Collection Time: 03/14/24  8:56 AM  Result Value Ref Range   Creatinine, Ser 1.50 (H) 0.61 - 1.24 mg/dL          Assessment & Plan:   Problem List Items Addressed This Visit   None Visit Diagnoses       B12 deficiency    -  Primary   B12 shot given today.   Relevant Medications   cyanocobalamin  (VITAMIN B12) injection 1,000 mcg (Completed)     Pruritus       Hydroxyzine  10mg  prescribed to take as needed for itching. Patient also has prescription for Kenalog  cream he can aply to areas. Advised to avoid triggers.   Relevant Medications   hydrOXYzine  (ATARAX ) 10 MG tablet       Home care: -Take Hydroxyzine  10 mg as needed for itching. Advised patient that this medication can make you drowsy so advised to take it at night before bed.  -Continue using Topical Kenalog  cream  to areas -Advised to moisturize skin after showering to prevent dry skin -Advised to avoid triggers, such as heat.         Follow up plan: Return if symptoms worsen or fail to improve.   I have reviewed this encounter including the documentation in this note and/or discussed this patient with the provider, Aislinn Womack, SNP, I am certifying that I agree with the content of this note as supervising/preceptor nurse practitioner.  Mliss Spray, FNP-C [facility] 03/24/2024, 11:39 AM

## 2024-04-03 ENCOUNTER — Telehealth: Payer: Self-pay

## 2024-04-03 NOTE — Telephone Encounter (Signed)
 Pt called in with a c/o an occurrence of blood in his urine one time last week. Pt states it has since resolved and he has no other urinary symptoms at this moment. Pt asked if we could move up his cysto from 10/22 to sooner but after checking that we are booked out pt was ok with keeping current appt. Pt was instructed to call us  back if he had reoccurrence that worsened and we can see about getting him into [facility] sooner.

## 2024-04-19 DIAGNOSIS — H401132 Primary open-angle glaucoma, bilateral, moderate stage: Secondary | ICD-10-CM | POA: Diagnosis not present

## 2024-04-20 ENCOUNTER — Ambulatory Visit: Admitting: Family Medicine

## 2024-04-20 ENCOUNTER — Encounter: Payer: Self-pay | Admitting: Family Medicine

## 2024-04-20 VITALS — BP 128/84 | HR 98 | Resp 16 | Ht 69.0 in | Wt 199.4 lb

## 2024-04-20 DIAGNOSIS — F1021 Alcohol dependence, in remission: Secondary | ICD-10-CM

## 2024-04-20 DIAGNOSIS — Z8546 Personal history of malignant neoplasm of prostate: Secondary | ICD-10-CM

## 2024-04-20 DIAGNOSIS — J410 Simple chronic bronchitis: Secondary | ICD-10-CM

## 2024-04-20 DIAGNOSIS — K4090 Unilateral inguinal hernia, without obstruction or gangrene, not specified as recurrent: Secondary | ICD-10-CM

## 2024-04-20 DIAGNOSIS — E538 Deficiency of other specified B group vitamins: Secondary | ICD-10-CM

## 2024-04-20 DIAGNOSIS — D472 Monoclonal gammopathy: Secondary | ICD-10-CM | POA: Diagnosis not present

## 2024-04-20 DIAGNOSIS — D444 Neoplasm of uncertain behavior of craniopharyngeal duct: Secondary | ICD-10-CM | POA: Diagnosis not present

## 2024-04-20 DIAGNOSIS — I700 Atherosclerosis of aorta: Secondary | ICD-10-CM

## 2024-04-20 DIAGNOSIS — N1831 Chronic kidney disease, stage 3a: Secondary | ICD-10-CM

## 2024-04-20 DIAGNOSIS — Z23 Encounter for immunization: Secondary | ICD-10-CM

## 2024-04-20 DIAGNOSIS — I10 Essential (primary) hypertension: Secondary | ICD-10-CM

## 2024-04-20 DIAGNOSIS — F325 Major depressive disorder, single episode, in full remission: Secondary | ICD-10-CM | POA: Diagnosis not present

## 2024-04-20 DIAGNOSIS — N2581 Secondary hyperparathyroidism of renal origin: Secondary | ICD-10-CM

## 2024-04-20 MED ORDER — ROSUVASTATIN CALCIUM 40 MG PO TABS: ORAL_TABLET | ORAL | 1 refills | Status: AC

## 2024-04-20 MED ORDER — FLUTICASONE-SALMETEROL 100-50 MCG/ACT IN AEPB: 1.0000 | INHALATION_SPRAY | Freq: Two times a day (BID) | RESPIRATORY_TRACT | 5 refills | Status: AC

## 2024-04-20 NOTE — Progress Notes (Signed)
 Name: Ernest Sweeney   MRN: [id number]    DOB: 1951/05/23   Date:04/20/2024       Progress Note  Subjective  Chief Complaint  Chief Complaint  Patient presents with   Medical Management of Chronic Issues   Discussed the use of AI scribe software for clinical note transcription with the patient, who gave verbal consent to proceed.  History of Present Illness Ernest Sweeney is a 73 year old male who presents for a regular follow-up visit.  In August, he visited the emergency room for back pain radiating down his right leg, diagnosed as radiculitis. He reports that he was told he had a left kidney stone and a left inguinal hernia after a CT scan in the emergency room. He has a history of a left inguinal hernia that has been present for a while. He reports no back pain or radicular symptoms at this time.  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 He lost follow-up with the endocrinologist but later was told that his labs were normal when he saw the endocrinologist. No current headaches, double vision, or abnormal urine volume are reported.  He has smoldering multiple myeloma and is on an annual follow-up schedule with Dr. Jacobo. His last visit was at the beginning of the year.  He has chronic kidney disease stage 3A with a history of microalbuminuria and proteinuria. He takes amlodipine  10 mg for blood pressure control. He reports a history of hyperparathyroidism, which has caused kidney stones and occasional muscle cramps in his legs. He used to take lisinopril  but discontinued by nephrologist.   He has chronic bronchitis and quit smoking in 2016 after smoking half a pack a day for over 40 years. He experiences a mild cough and occasional wheezing but does not use an inhaler due to cost. He reports chest congestion most days  and occasional shortness of breath. He is willing to try an inhaler again    He has high cholesterol and takes rosuvastatin . His last cholesterol check was in July of the previous year, with an LDL of 79.  He has a history of prostate cancer treated with prostatectomy and a history of right inguinal hernia repair. He currently has a left inguinal hernia but prefers to wait for treatment until it causes problems.  He has a history of alcohol use, currently drinking about a pint a week, which is less than his past consumption. He reports that his depression is not as severe as it used to be.    Patient Active Problem List   Diagnosis Date Noted   Craniopharyngioma (HCC) 08/16/2023   Smoldering multiple myeloma 12/02/2022   Disorder of kidney due to lambda light chain disease 09/30/2022   Other neutropenia 12/30/2021   Intermittent low back pain 12/30/2021   Essential hypertension 12/30/2021   Left inguinal hernia 12/30/2021   History of right inguinal hernia repair    Alcoholism (HCC) 06/28/2020   Major depression in remission 06/28/2020   Lung nodules 09/04/2018   Chronic bronchitis (HCC) 04/23/2017   Atherosclerosis of aorta 04/23/2017   Coronary artery disease due to calcified coronary lesion 04/23/2017   Glaucoma, left eye 08/04/2016   History of prostate cancer 07/15/2016   History of prostatectomy 07/15/2016   Vitamin D  deficiency 05/31/2015   Dyslipidemia 05/31/2015   Seasonal allergic rhinitis 05/31/2015   Chronic pain of both shoulders 05/31/2015   Family history of malignant neoplasm  of prostate 07/26/2012   Hematuria, microscopic 07/26/2012    Past Surgical History:  Procedure Laterality Date   COLONOSCOPY  07/13/2008   Dr Philip   COLONOSCOPY WITH PROPOFOL  N/A 01/05/2020   Procedure: COLONOSCOPY WITH PROPOFOL ;  Surgeon: Therisa Bi, MD;  Location: [facility] ENDOSCOPY;  Service: Gastroenterology;  Laterality: N/A;   excision skin cyst  06/19/2014   Dr. Heloise CYST   INGUINAL HERNIA REPAIR Right 09/06/2020   Procedure: HERNIA  REPAIR RIGH  INGUINAL WITH MESH ADULT, open with RNFA to assist;  Surgeon: Marolyn Nest, MD;  Location: [facility] ORS;  Service: General;  Laterality: Right;  Provider requesting 2 hours/120 minutes for procedure.   Neuroendoscopy  05/28/2022   With excision pituitary tumor transnasal or transphenoidal approach. Resection of craniopharyngioma.   PELVIC LYMPH NODE DISSECTION N/A 07/15/2016   Procedure: PELVIC LYMPH NODE DISSECTION;  Surgeon: Redell Lynwood Napoleon, MD;  Location: [facility] ORS;  Service: Urology;  Laterality: N/A;   ROBOT ASSISTED LAPAROSCOPIC RADICAL PROSTATECTOMY N/A 07/15/2016   Procedure: ROBOTIC ASSISTED LAPAROSCOPIC RADICAL PROSTATECTOMY;  Surgeon: Redell Lynwood Napoleon, MD;  Location: [facility] ORS;  Service: Urology;  Laterality: N/A;    Family History  Problem Relation Age of Onset   Heart disease Mother    Heart attack Mother    Diabetes Father    Heart attack Father    Dementia Father    Cancer Sister        Breast   Cancer Brother        Prostate   Healthy Sister    Stroke Brother     Social History   Tobacco Use   Smoking status: Former    Current packs/day: 0.00    Average packs/day: 0.5 packs/day for 40.0 years (20.0 ttl pk-yrs)    Types: Cigarettes    Start date: 03/23/1976    Quit date: 03/23/2016    Years since quitting: 8.0   Smokeless tobacco: Never  Substance Use Topics   Alcohol use: Yes    Alcohol/week: 2.0 - 4.0 standard drinks of alcohol    Types: 2 - 4 Standard drinks or equivalent per week    Comment: 1-2 times per week     Current Outpatient Medications:    amLODipine  (NORVASC ) 10 MG tablet, Take 10 mg by mouth daily., Disp: , Rfl:    aspirin 81 MG tablet, Take 81 mg by mouth daily., Disp: , Rfl:    brimonidine-timolol (COMBIGAN) 0.2-0.5 % ophthalmic solution, Place 1 drop into both eyes every 12 (twelve) hours., Disp: , Rfl:    Cholecalciferol (VITAMIN D ) 2000 units CAPS, Take 1 capsule (2,000 Units total) by mouth daily., Disp: 30 capsule, Rfl:  0   fluticasone  (FLONASE ) 50 MCG/ACT nasal spray, Place 2 sprays into both nostrils daily., Disp: 16 g, Rfl: 6   hydrOXYzine  (ATARAX ) 10 MG tablet, Take 1 tablet (10 mg total) by mouth every 6 (six) hours as needed for itching., Disp: 30 tablet, Rfl: 0   latanoprost  (XALATAN ) 0.005 % ophthalmic solution, Place 1 drop into both eyes at bedtime. , Disp: , Rfl:    nortriptyline (PAMELOR) 10 MG capsule, Take 20 mg by mouth at bedtime., Disp: , Rfl:    rosuvastatin  (CRESTOR ) 40 MG tablet, TAKE 1 TABLET BY MOUTH ONCE DAILY IN  PLACE  OF  ATORVASTATIN , Disp: 90 tablet, Rfl: 1   sildenafil  (VIAGRA ) 100 MG tablet, Take 1 tablet (100 mg total) by mouth daily as needed for erectile dysfunction., Disp: 30 tablet, Rfl: 6   tiZANidine  (  ZANAFLEX ) 4 MG tablet, Take 0.5-1.5 tablets (2-6 mg total) by mouth every 8 (eight) hours as needed for muscle spasms (muscle tightness)., Disp: 90 tablet, Rfl: 0   triamcinolone  cream (KENALOG ) 0.1 %, Apply topically 2 (two) times daily., Disp: 45 g, Rfl: 0   amLODipine  (NORVASC ) 5 MG tablet, Take 1 tablet (5 mg total) by mouth daily. (Patient not taking: Reported on 04/20/2024), Disp: 90 tablet, Rfl: 1   predniSONE  (STERAPRED UNI-PAK 21 TAB) 10 MG (21) TBPK tablet, Take 6 tablets on the first day and decrease by 1 tablet each day until finished. (Patient not taking: Reported on 04/20/2024), Disp: 21 tablet, Rfl: 0  No Known Allergies  I personally reviewed active problem list, medication list, allergies with the patient/caregiver today.   ROS  Ten systems reviewed and is negative except as mentioned in HPI    Objective Physical Exam VITALS: BP- 128/84 CONSTITUTIONAL: Patient appears well-developed and well-nourished. No distress. HEENT: Head atraumatic, normocephalic, neck supple. CARDIOVASCULAR: Normal rate, regular rhythm and normal heart sounds. No murmur heard. No edema in extremities. PULMONARY: Effort normal and breath sounds normal. Lungs clear to auscultation  bilaterally. No respiratory distress. ABDOMINAL: There is no tenderness or distention. MUSCULOSKELETAL: Normal gait. Without gross motor or sensory deficit. PSYCHIATRIC: Patient has a normal mood and affect. Behavior is normal. Judgment and thought content normal.  Vitals:   04/20/24 0756  BP: 128/84  Pulse: 98  Resp: 16  SpO2: 98%  Weight: 199 lb 6.4 oz (90.4 kg)  Height: 5' 9 (1.753 m)    Body mass index is 29.45 kg/m.  Recent Results (from the past 2160 hours)  Urinalysis, Routine w reflex microscopic -Urine, Clean Catch     Status: Abnormal   Collection Time: 02/22/24 12:20 PM  Result Value Ref Range   Color, Urine YELLOW (A) YELLOW   APPearance CLEAR (A) CLEAR   Specific Gravity, Urine 1.012 1.005 - 1.030   pH 7.0 5.0 - 8.0   Glucose, UA NEGATIVE NEGATIVE mg/dL   Hgb urine dipstick SMALL (A) NEGATIVE   Bilirubin Urine NEGATIVE NEGATIVE   Ketones, ur NEGATIVE NEGATIVE mg/dL   Protein, ur 30 (A) NEGATIVE mg/dL   Nitrite NEGATIVE NEGATIVE   Leukocytes,Ua NEGATIVE NEGATIVE   RBC / HPF 0-5 0 - 5 RBC/hpf   WBC, UA 0-5 0 - 5 WBC/hpf   Bacteria, UA RARE (A) NONE SEEN   Squamous Epithelial / HPF 0-5 0 - 5 /HPF   Mucus PRESENT     Comment: Performed at [facility], [address] [place], [place] [address]  I-STAT creatinine     Status: Abnormal   Collection Time: 03/14/24  8:56 AM  Result Value Ref Range   Creatinine, Ser 1.50 (H) 0.61 - 1.24 mg/dL      EYV7/0:    89/0/7974    7:49 AM 03/24/2024   10:59 AM 02/04/2024    9:11 AM 10/15/2023    8:14 AM 07/20/2023   11:17 AM  Depression screen PHQ 2/9  Decreased Interest 0 0 0 0 0  Down, Depressed, Hopeless 0 0 0 0 0  PHQ - 2 Score 0 0 0 0 0  Altered sleeping 0 0 2 0 0  Tired, decreased energy 0 0 0 0 0  Change in appetite 0 0 0 0 0  Feeling bad or failure about yourself  0 0 0 0 0  Trouble concentrating 0 0 0 0 0  Moving slowly or fidgety/restless 0 0 0 0 0  Suicidal thoughts 0 0 0 0 0  PHQ-9 Score 0  0 2 0 0  Difficult doing work/chores Not difficult at all Not difficult at all Not difficult at all Not difficult at all Not difficult at all    phq 9 is negative  Fall Risk:    04/20/2024    7:49 AM 03/24/2024   10:58 AM 02/04/2024    9:10 AM 10/15/2023    8:14 AM 07/20/2023   11:17 AM  Fall Risk   Falls in the past year? 0 0 0 0 0  Number falls in past yr: 0 0 0 0 0  Injury with Fall? 0 0 0 0 0  Risk for fall due to : No Fall Risks No Fall Risks No Fall Risks No Fall Risks No Fall Risks  Follow up Falls evaluation completed Falls evaluation completed Falls evaluation completed Falls prevention discussed;Education provided;Falls evaluation completed Falls prevention discussed;Education provided;Falls evaluation completed      Assessment & Plan Chronic kidney disease stage 3A with bilateral nephrolithiasis and intermittent hematuria Blood pressure controlled with amlodipine . Hematuria persists, under urologist care. - Continue amlodipine  for blood pressure control. - Follow up with urologist for cystoscopy on May 03, 2024. - Monitor kidney function and hematuria.  Bladder diverticulum versus ureterocele (under evaluation) Under urologist evaluation. Cystoscopy scheduled. - Proceed with cystoscopy on May 03, 2024 for further evaluation.  Left inguinal hernia Moderate to large hernia with small bowel involvement. - Consider surgical consultation if symptoms develop.  Secondary  hyperparathyroidism/renal  Associated with reflux and muscle cramps. Monitoring for kidney stones. - Ensure adequate hydration to prevent kidney stones. - Monitor for symptoms of reflux and muscle cramps.  Simple Chronic bronchitis Symptoms of cough and wheezing. New inhaler prescribed. - Prescribe Advair Diskus 100/50, one puff daily. - Monitor symptoms and adjust treatment as needed.  Hypertension Well-controlled with amlodipine . Blood pressure 128/84 mmHg. - Continue amlodipine  10 mg  daily. - Monitor blood pressure regularly.  Atherosclerosis with hyperlipidemia Managed with rosuvastatin . Due for repeat lipid panel. - Order lipid panel to assess cholesterol levels. - Continue rosuvastatin  as prescribed.  Smoldering multiple myeloma (under surveillance) Under surveillance with annual follow-up. - Continue annual follow-up with oncologist.  Residual craniopharyngioma (under surveillance) Under surveillance. No recurrence on recent MRI. - Continue annual MRI surveillance. - Follow up with neurosurgeon and oncologist as scheduled.  Alcohol use Ongoing use, approximately a pint per week. Advised to reduce intake. - Encourage reduction in alcohol consumption.  Depression Not as severe as previously. Monitoring symptoms. - Continue monitoring symptoms of depression.  Encounter for immunization Received flu shot during this visit.  General Health Maintenance Routine health maintenance discussed including upcoming colonoscopy and cholesterol check. - Schedule colonoscopy for June 2026. - Order comprehensive metabolic panel including liver enzymes and B12 level.

## 2024-04-21 ENCOUNTER — Ambulatory Visit: Payer: Self-pay | Admitting: Family Medicine

## 2024-04-21 LAB — COMPREHENSIVE METABOLIC PANEL WITH GFR
AG Ratio: 1.9 (calc) (ref 1.0–2.5)
ALT: 15 U/L (ref 9–46)
AST: 17 U/L (ref 10–35)
Albumin: 4.6 g/dL (ref 3.6–5.1)
Alkaline phosphatase (APISO): 60 U/L (ref 35–144)
BUN/Creatinine Ratio: 10 (calc) (ref 6–22)
BUN: 13 mg/dL (ref 7–25)
CO2: 29 mmol/L (ref 20–32)
Calcium: 9.9 mg/dL (ref 8.6–10.3)
Chloride: 103 mmol/L (ref 98–110)
Creat: 1.35 mg/dL — ABNORMAL HIGH (ref 0.70–1.28)
Globulin: 2.4 g/dL (ref 1.9–3.7)
Glucose, Bld: 94 mg/dL (ref 65–99)
Potassium: 4.3 mmol/L (ref 3.5–5.3)
Sodium: 139 mmol/L (ref 135–146)
Total Bilirubin: 0.8 mg/dL (ref 0.2–1.2)
Total Protein: 7 g/dL (ref 6.1–8.1)
eGFR: 56 mL/min/1.73m2 — ABNORMAL LOW (ref >=60)

## 2024-04-21 LAB — LIPID PANEL
Cholesterol: 132 mg/dL (ref <200)
HDL: 55 mg/dL (ref >=40)
LDL Cholesterol (Calc): 61 mg/dL
Non-HDL Cholesterol (Calc): 77 mg/dL (ref <130)
Total CHOL/HDL Ratio: 2.4 (calc) (ref <5.0)
Triglycerides: 78 mg/dL (ref <150)

## 2024-04-21 LAB — B12 AND FOLATE PANEL
Folate: 13.4 ng/mL
Vitamin B-12: 535 pg/mL (ref 200–1100)

## 2024-04-25 ENCOUNTER — Ambulatory Visit (INDEPENDENT_AMBULATORY_CARE_PROVIDER_SITE_OTHER)

## 2024-04-25 DIAGNOSIS — E538 Deficiency of other specified B group vitamins: Secondary | ICD-10-CM | POA: Diagnosis not present

## 2024-04-25 MED ORDER — CYANOCOBALAMIN 1000 MCG/ML IJ SOLN
1000.0000 ug | Freq: Once | INTRAMUSCULAR | Status: AC
  Administered 2024-04-25: 1000 ug via INTRAMUSCULAR

## 2024-04-27 DIAGNOSIS — H2513 Age-related nuclear cataract, bilateral: Secondary | ICD-10-CM | POA: Diagnosis not present

## 2024-04-27 DIAGNOSIS — H401132 Primary open-angle glaucoma, bilateral, moderate stage: Secondary | ICD-10-CM | POA: Diagnosis not present

## 2024-05-03 ENCOUNTER — Ambulatory Visit: Admitting: Urology

## 2024-05-03 VITALS — BP 148/100 | HR 105 | Ht 69.0 in | Wt 197.0 lb

## 2024-05-03 DIAGNOSIS — R310 Gross hematuria: Secondary | ICD-10-CM | POA: Diagnosis not present

## 2024-05-03 DIAGNOSIS — C61 Malignant neoplasm of prostate: Secondary | ICD-10-CM

## 2024-05-03 DIAGNOSIS — N393 Stress incontinence (female) (male): Secondary | ICD-10-CM

## 2024-05-03 DIAGNOSIS — R82998 Other abnormal findings in urine: Secondary | ICD-10-CM | POA: Diagnosis not present

## 2024-05-03 MED ORDER — CEPHALEXIN 250 MG PO CAPS
500.0000 mg | ORAL_CAPSULE | Freq: Once | ORAL | Status: AC
  Administered 2024-05-03: 500 mg via ORAL

## 2024-05-03 MED ORDER — LIDOCAINE HCL URETHRAL/MUCOSAL 2 % EX GEL
1.0000 | Freq: Once | CUTANEOUS | Status: AC
  Administered 2024-05-03: 1 via URETHRAL

## 2024-05-03 NOTE — Progress Notes (Signed)
 Cystoscopy Procedure Note:  Indication: Gross hematuria, history of radical prostatectomy by Dr. Chauncey for prostate cancer 2018, salvage radiation August 2019  Keflex given for prophylaxis  After informed consent and discussion of the procedure and its risks, ANIKETH HUBERTY was positioned and prepped in the standard fashion. Cystoscopy was performed with a flexible cystoscope. The urethra, bladder neck and entire bladder was visualized in a standard fashion. The prostate was surgically absent. The ureteral orifices were visualized in their normal location and orientation.  Very mild radiation cystitis near the bladder neck, diverticulum just lateral to the left ureteral orifice with no suspicious lesions.  Mild radiation cystitis at the bladder neck on retroflexion, no tumors or suspicious lesions.  Cytology sent  Imaging: CTU reviewed, no evidence of malignancy  Findings: Mild radiation cystitis at the bladder neck, no tumors or lesions  Assessment and Plan: Contact with cytology, if negative continue observation RTC as scheduled December 2025  Redell Burnet, MD 05/03/2024

## 2024-05-10 ENCOUNTER — Ambulatory Visit: Payer: Self-pay | Admitting: Urology

## 2024-05-10 LAB — CYTOLOGY PLUS MONITORING PROFILE: PAP & FEULGEN

## 2024-05-19 DIAGNOSIS — H2511 Age-related nuclear cataract, right eye: Secondary | ICD-10-CM | POA: Diagnosis not present

## 2024-05-19 DIAGNOSIS — H401133 Primary open-angle glaucoma, bilateral, severe stage: Secondary | ICD-10-CM | POA: Diagnosis not present

## 2024-05-19 DIAGNOSIS — H2513 Age-related nuclear cataract, bilateral: Secondary | ICD-10-CM | POA: Diagnosis not present

## 2024-05-25 ENCOUNTER — Ambulatory Visit

## 2024-05-25 DIAGNOSIS — E538 Deficiency of other specified B group vitamins: Secondary | ICD-10-CM

## 2024-05-25 MED ORDER — CYANOCOBALAMIN 1000 MCG/ML IJ SOLN
1000.0000 ug | Freq: Once | INTRAMUSCULAR | Status: AC
  Administered 2024-05-30: 1000 ug via INTRAMUSCULAR

## 2024-05-25 NOTE — Progress Notes (Signed)
 Patient is in office today for a nurse visit for B12 Injection. Patient Injection was given in the  Right deltoid. Patient tolerated injection well.

## 2024-05-30 DIAGNOSIS — E538 Deficiency of other specified B group vitamins: Secondary | ICD-10-CM | POA: Diagnosis not present

## 2024-06-14 ENCOUNTER — Other Ambulatory Visit: Payer: Self-pay | Admitting: *Deleted

## 2024-06-14 DIAGNOSIS — D472 Monoclonal gammopathy: Secondary | ICD-10-CM

## 2024-06-15 ENCOUNTER — Inpatient Hospital Stay: Attending: Oncology

## 2024-06-15 DIAGNOSIS — Z803 Family history of malignant neoplasm of breast: Secondary | ICD-10-CM | POA: Diagnosis not present

## 2024-06-15 DIAGNOSIS — Z87891 Personal history of nicotine dependence: Secondary | ICD-10-CM | POA: Diagnosis not present

## 2024-06-15 DIAGNOSIS — Z923 Personal history of irradiation: Secondary | ICD-10-CM | POA: Insufficient documentation

## 2024-06-15 DIAGNOSIS — N189 Chronic kidney disease, unspecified: Secondary | ICD-10-CM | POA: Insufficient documentation

## 2024-06-15 DIAGNOSIS — D72819 Decreased white blood cell count, unspecified: Secondary | ICD-10-CM | POA: Diagnosis not present

## 2024-06-15 DIAGNOSIS — D472 Monoclonal gammopathy: Secondary | ICD-10-CM | POA: Diagnosis present

## 2024-06-15 DIAGNOSIS — Z79899 Other long term (current) drug therapy: Secondary | ICD-10-CM | POA: Diagnosis not present

## 2024-06-15 DIAGNOSIS — Z8546 Personal history of malignant neoplasm of prostate: Secondary | ICD-10-CM | POA: Diagnosis not present

## 2024-06-15 DIAGNOSIS — Z8042 Family history of malignant neoplasm of prostate: Secondary | ICD-10-CM | POA: Insufficient documentation

## 2024-06-15 LAB — CBC WITH DIFFERENTIAL/PLATELET
Abs Immature Granulocytes: 0.01 K/uL (ref 0.00–0.07)
Basophils Absolute: 0 K/uL (ref 0.0–0.1)
Basophils Relative: 1 %
Eosinophils Absolute: 0.1 K/uL (ref 0.0–0.5)
Eosinophils Relative: 3 %
HCT: 46.5 % (ref 39.0–52.0)
Hemoglobin: 16.4 g/dL (ref 13.0–17.0)
Immature Granulocytes: 0 %
Lymphocytes Relative: 31 %
Lymphs Abs: 1 K/uL (ref 0.7–4.0)
MCH: 32.4 pg (ref 26.0–34.0)
MCHC: 35.3 g/dL (ref 30.0–36.0)
MCV: 91.9 fL (ref 80.0–100.0)
Monocytes Absolute: 0.4 K/uL (ref 0.1–1.0)
Monocytes Relative: 12 %
Neutro Abs: 1.8 K/uL (ref 1.7–7.7)
Neutrophils Relative %: 53 %
Platelets: 355 K/uL (ref 150–400)
RBC: 5.06 MIL/uL (ref 4.22–5.81)
RDW: 12.4 % (ref 11.5–15.5)
WBC: 3.3 K/uL — ABNORMAL LOW (ref 4.0–10.5)
nRBC: 0 % (ref 0.0–0.2)

## 2024-06-15 LAB — CMP (CANCER CENTER ONLY)
ALT: 15 U/L (ref 0–44)
AST: 22 U/L (ref 15–41)
Albumin: 4.5 g/dL (ref 3.5–5.0)
Alkaline Phosphatase: 75 U/L (ref 38–126)
Anion gap: 11 (ref 5–15)
BUN: 11 mg/dL (ref 8–23)
CO2: 24 mmol/L (ref 22–32)
Calcium: 10 mg/dL (ref 8.9–10.3)
Chloride: 105 mmol/L (ref 98–111)
Creatinine: 1.38 mg/dL — ABNORMAL HIGH (ref 0.61–1.24)
GFR, Estimated: 54 mL/min — ABNORMAL LOW (ref >60)
Glucose, Bld: 103 mg/dL — ABNORMAL HIGH (ref 70–99)
Potassium: 3.9 mmol/L (ref 3.5–5.1)
Sodium: 140 mmol/L (ref 135–145)
Total Bilirubin: 0.8 mg/dL (ref 0.0–1.2)
Total Protein: 7.4 g/dL (ref 6.5–8.1)

## 2024-06-16 LAB — KAPPA/LAMBDA LIGHT CHAINS
Kappa free light chain: 13.5 mg/L (ref 3.3–19.4)
Kappa, lambda light chain ratio: 0.07 — ABNORMAL LOW (ref 0.26–1.65)
Lambda free light chains: 190.1 mg/L — ABNORMAL HIGH (ref 5.7–26.3)

## 2024-06-16 LAB — IGG, IGA, IGM
IgA: 40 mg/dL — ABNORMAL LOW (ref 61–437)
IgG (Immunoglobin G), Serum: 953 mg/dL (ref 603–1613)
IgM (Immunoglobulin M), Srm: 294 mg/dL — ABNORMAL HIGH (ref 15–143)

## 2024-06-18 LAB — PROTEIN ELECTROPHORESIS, SERUM
A/G Ratio: 1.3 (ref 0.7–1.7)
Albumin ELP: 3.8 g/dL (ref 2.9–4.4)
Alpha-1-Globulin: 0.2 g/dL (ref 0.0–0.4)
Alpha-2-Globulin: 0.7 g/dL (ref 0.4–1.0)
Beta Globulin: 1 g/dL (ref 0.7–1.3)
Gamma Globulin: 1.1 g/dL (ref 0.4–1.8)
Globulin, Total: 3 g/dL (ref 2.2–3.9)
M-Spike, %: 0.5 g/dL — ABNORMAL HIGH
Total Protein ELP: 6.8 g/dL (ref 6.0–8.5)

## 2024-06-22 ENCOUNTER — Inpatient Hospital Stay: Admitting: Oncology

## 2024-06-22 ENCOUNTER — Encounter: Payer: Self-pay | Admitting: Oncology

## 2024-06-22 VITALS — BP 130/90 | HR 68 | Temp 97.1°F | Resp 18 | Ht 69.0 in | Wt 199.0 lb

## 2024-06-22 DIAGNOSIS — D472 Monoclonal gammopathy: Secondary | ICD-10-CM | POA: Diagnosis not present

## 2024-06-22 NOTE — Progress Notes (Signed)
 Patient has been doing well, no new questions for the doctor today.

## 2024-06-22 NOTE — Progress Notes (Signed)
 [facility]  [phone] [phone]  ID: Ernest Sweeney OB: 03-08-1951  MR#: [id number]  RDW#:[id number]  Patient Care Team: Sowles, Krichna, MD as PCP - General (Family Medicine) Ernest Glendia BROCKS, MD as Consulting Physician (Urology) Ernest Redell BROCKS, MD as Consulting Physician (Urology) Lenn Aran, MD as Referring Physician (Radiation Oncology) Florencio Cara BIRCH, MD as Consulting Physician (Cardiology) Ernest Ernest PARAS, MD as Consulting Physician (Oncology)  CHIEF COMPLAINT: Smoldering lambda chain myeloma with q13-.  INTERVAL HISTORY: Patient returns to clinic today for repeat laboratory work and routine 6-month evaluation.  She continues to feel well and remains asymptomatic.  He has no neurologic complaints. He denies any recent fevers or illnesses.  He has a good appetite and denies weight loss.  He has no chest pain, shortness of breath, cough, or hemoptysis.  He denies any nausea, vomiting, constipation, or diarrhea.  He has no urinary complaints.  Patient offers no specific complaints today.  REVIEW OF SYSTEMS:   Review of Systems  Constitutional: Negative.  Negative for fever, malaise/fatigue and weight loss.  Eyes:  Negative for blurred vision and double vision.  Respiratory: Negative.  Negative for cough, hemoptysis and shortness of breath.   Cardiovascular: Negative.  Negative for chest pain and leg swelling.  Gastrointestinal: Negative.  Negative for abdominal pain.  Genitourinary: Negative.  Negative for dysuria.  Musculoskeletal: Negative.  Negative for back pain.  Skin: Negative.  Negative for rash.  Neurological: Negative.  Negative for dizziness, focal weakness, weakness and headaches.  Psychiatric/Behavioral: Negative.  The patient is not nervous/anxious.     As per HPI. Otherwise, a complete review of systems is negative.  PAST MEDICAL HISTORY: Past Medical History:  Diagnosis Date   Anxiety    Panic attacks  in the past   Arthritis    Benign prostatic hypertrophy without lower urinary tract symptoms    Cancer (HCC)    Prostate. monitoring now since surgery   Colon polyp    GERD (gastroesophageal reflux disease)    Glaucoma (increased eye pressure)    Left eye only   Hyperlipidemia    Hypertension     PAST SURGICAL HISTORY: Past Surgical History:  Procedure Laterality Date   COLONOSCOPY  07/13/2008   Dr Philip   COLONOSCOPY WITH PROPOFOL  N/A 01/05/2020   Procedure: COLONOSCOPY WITH PROPOFOL ;  Surgeon: Therisa Bi, MD;  Location: [facility] ENDOSCOPY;  Service: Gastroenterology;  Laterality: N/A;   excision skin cyst  06/19/2014   Dr. Heloise CYST   INGUINAL HERNIA REPAIR Right 09/06/2020   Procedure: HERNIA REPAIR RIGH  INGUINAL WITH MESH ADULT, open with RNFA to assist;  Surgeon: Marolyn Nest, MD;  Location: [facility] ORS;  Service: General;  Laterality: Right;  Provider requesting 2 hours/120 minutes for procedure.   Neuroendoscopy  05/28/2022   With excision pituitary tumor transnasal or transphenoidal approach. Resection of craniopharyngioma.   PELVIC LYMPH NODE DISSECTION N/A 07/15/2016   Procedure: PELVIC LYMPH NODE DISSECTION;  Surgeon: Redell Lynwood Napoleon, MD;  Location: [facility] ORS;  Service: Urology;  Laterality: N/A;   ROBOT ASSISTED LAPAROSCOPIC RADICAL PROSTATECTOMY N/A 07/15/2016   Procedure: ROBOTIC ASSISTED LAPAROSCOPIC RADICAL PROSTATECTOMY;  Surgeon: Redell Lynwood Napoleon, MD;  Location: [facility] ORS;  Service: Urology;  Laterality: N/A;    FAMILY HISTORY: Family History  Problem Relation Age of Onset   Heart disease Mother    Heart attack Mother    Diabetes Father    Heart attack Father    Dementia Father    Cancer  Sister        Breast   Cancer Brother        Prostate   Healthy Sister    Stroke Brother     ADVANCED DIRECTIVES (Y/N):  N  HEALTH MAINTENANCE: Social History   Tobacco Use   Smoking status: Former    Current packs/day: 0.00    Average  packs/day: 0.5 packs/day for 40.0 years (20.0 ttl pk-yrs)    Types: Cigarettes    Start date: 03/23/1976    Quit date: 03/23/2016    Years since quitting: 8.2   Smokeless tobacco: Never  Vaping Use   Vaping status: Never Used  Substance Use Topics   Alcohol use: Yes    Alcohol/week: 2.0 - 4.0 standard drinks of alcohol    Types: 2 - 4 Standard drinks or equivalent per week    Comment: 1-2 times per week   Drug use: No     Colonoscopy:  PAP:  Bone density:  Lipid panel:  No Known Allergies  Current Outpatient Medications  Medication Sig Dispense Refill   amLODipine  (NORVASC ) 10 MG tablet Take 10 mg by mouth daily.     aspirin 81 MG tablet Take 81 mg by mouth daily.     brimonidine-timolol (COMBIGAN) 0.2-0.5 % ophthalmic solution Place 1 drop into both eyes every 12 (twelve) hours.     Cholecalciferol (VITAMIN D ) 2000 units CAPS Take 1 capsule (2,000 Units total) by mouth daily. 30 capsule 0   fluticasone  (FLONASE ) 50 MCG/ACT nasal spray Place 2 sprays into both nostrils daily. 16 g 6   fluticasone -salmeterol (ADVAIR) 100-50 MCG/ACT AEPB Inhale 1 puff into the lungs 2 (two) times daily. 60 each 5   latanoprost  (XALATAN ) 0.005 % ophthalmic solution Place 1 drop into both eyes at bedtime.      nortriptyline (PAMELOR) 10 MG capsule Take 20 mg by mouth at bedtime.     ROCKLATAN 0.02-0.005 % SOLN SMARTSIG:1 In Eye(s) Daily     rosuvastatin  (CRESTOR ) 40 MG tablet TAKE 1 TABLET BY MOUTH ONCE DAILY IN  PLACE  OF  ATORVASTATIN  90 tablet 1   sildenafil  (VIAGRA ) 100 MG tablet Take 1 tablet (100 mg total) by mouth daily as needed for erectile dysfunction. 30 tablet 6   tiZANidine  (ZANAFLEX ) 4 MG tablet Take 0.5-1.5 tablets (2-6 mg total) by mouth every 8 (eight) hours as needed for muscle spasms (muscle tightness). 90 tablet 0   triamcinolone  cream (KENALOG ) 0.1 % Apply topically 2 (two) times daily. 45 g 0   No current facility-administered medications for this visit.     OBJECTIVE: Vitals:   06/22/24 1033  BP: (!) 130/90  Pulse: 68  Resp: 18  Temp: (!) 97.1 F (36.2 C)  SpO2: 99%     Body mass index is 29.39 kg/m.    ECOG FS:0 - Asymptomatic  General: Well-developed, well-nourished, no acute distress. Eyes: Pink conjunctiva, anicteric sclera. HEENT: Normocephalic, moist mucous membranes. Lungs: No audible wheezing or coughing. Heart: Regular rate and rhythm. Abdomen: Soft, nontender, no obvious distention. Musculoskeletal: No edema, cyanosis, or clubbing. Neuro: Alert, answering all questions appropriately. Cranial nerves grossly intact. Skin: No rashes or petechiae noted. Psych: Normal affect.  LAB RESULTS:  Lab Results  Component Value Date   NA 140 06/15/2024   K 3.9 06/15/2024   CL 105 06/15/2024   CO2 24 06/15/2024   GLUCOSE 103 (H) 06/15/2024   BUN 11 06/15/2024   CREATININE 1.38 (H) 06/15/2024   CALCIUM  10.0 06/15/2024   PROT  7.4 06/15/2024   ALBUMIN 4.5 06/15/2024   AST 22 06/15/2024   ALT 15 06/15/2024   ALKPHOS 75 06/15/2024   BILITOT 0.8 06/15/2024   GFRNONAA 54 (L) 06/15/2024   GFRAA 74 05/02/2020    Lab Results  Component Value Date   WBC 3.3 (L) 06/15/2024   NEUTROABS 1.8 06/15/2024   HGB 16.4 06/15/2024   HCT 46.5 06/15/2024   MCV 91.9 06/15/2024   PLT 355 06/15/2024     STUDIES: No results found.  ASSESSMENT: Smoldering lambda chain myeloma with q13-, MYD88 mutation.  PLAN:    Smoldering lambda chain myeloma: Bone marrow biopsy completed on August 19, 2022 revealed approximately 25% plasma cells.  Cytogenetics reviewed q13 - mutation. The MYD 88 mutation is associated with IgM myeloma as well as [place]strm's and indicates an increased risk to progression to overt myeloma.  Patient's M spike has ranged from 0.3-0.5 since August 10, 2022.  Today's result is 0.5.  His IgM component has ranged from 285-305.  Today's result is 294.  His lambda free light chains have ranged from 166.4-227.6 over the  same timeframe.  Today's result is 190.1.  He continues to have a mild but stable renal insufficiency and a mild chronic leukopenia, but no other evidence of endorgan damage. Previously, case discussed with nephrology.  No treatment or intervention is needed at this time.  There is no need to repeat bone marrow biopsy unless concern for progression of disease.  Patient expressed understanding that he may require treatment in the future, but this is not necessary at this time.  Return to clinic in 6 months with repeat laboratory work and continuing evaluation. Chronic renal insufficiency: Chronic and unchanged.  Patient's most recent creatinine is 1.38.  Continue follow-up with nephrology as indicated.  Bone marrow biopsy as above.   Leukopenia: Chronic and unchanged.  Patient's total white blood cell count has ranged between 2.3 and 3.7 since April 2017.  Today's result is 3.3. History of pituitary tumor: Patient underwent surgical resection at [facility] in November 2023.  Continue follow-up and imaging with MRI with [facility] neurosurgery. History of prostate cancer: Patient underwent prostatectomy followed by XRT for local control.  PSA is monitored by primary care.    Patient expressed understanding and was in agreement with this plan. He also understands that He can call clinic at any time with any questions, concerns, or complaints.    Cancer Staging  No matching staging information was found for the patient.   Ernest JINNY Reusing, MD   06/22/2024 1:18 PM

## 2024-06-27 ENCOUNTER — Ambulatory Visit

## 2024-06-27 DIAGNOSIS — E538 Deficiency of other specified B group vitamins: Secondary | ICD-10-CM

## 2024-06-27 MED ORDER — CYANOCOBALAMIN 1000 MCG/ML IJ SOLN
1000.0000 ug | Freq: Once | INTRAMUSCULAR | Status: AC
  Administered 2024-06-27: 14:00:00 1000 ug via INTRAMUSCULAR

## 2024-07-26 ENCOUNTER — Ambulatory Visit

## 2024-07-26 DIAGNOSIS — E538 Deficiency of other specified B group vitamins: Secondary | ICD-10-CM

## 2024-07-26 MED ORDER — CYANOCOBALAMIN 1000 MCG/ML IJ SOLN
1000.0000 ug | Freq: Once | INTRAMUSCULAR | Status: AC
  Administered 2024-07-26: 1000 ug via INTRAMUSCULAR

## 2024-09-29 ENCOUNTER — Other Ambulatory Visit

## 2024-10-04 ENCOUNTER — Ambulatory Visit: Admitting: Urology

## 2024-10-05 ENCOUNTER — Ambulatory Visit: Admitting: Urology

## 2024-10-19 ENCOUNTER — Ambulatory Visit: Admitting: Family Medicine

## 2024-12-25 ENCOUNTER — Inpatient Hospital Stay

## 2025-01-01 ENCOUNTER — Inpatient Hospital Stay: Admitting: Oncology

## 2025-02-09 ENCOUNTER — Ambulatory Visit

## 2025-02-16 ENCOUNTER — Ambulatory Visit
